# Patient Record
Sex: Female | Born: 1940
Health system: Southern US, Community
[De-identification: ages and names within clinical notes are randomized; demographics above are authoritative.]

## PROBLEM LIST (undated history)

## (undated) DIAGNOSIS — K219 Gastro-esophageal reflux disease without esophagitis: Secondary | ICD-10-CM

## (undated) DIAGNOSIS — F32A Depression, unspecified: Secondary | ICD-10-CM

## (undated) DIAGNOSIS — I10 Essential (primary) hypertension: Secondary | ICD-10-CM

## (undated) DIAGNOSIS — N3941 Urge incontinence: Secondary | ICD-10-CM

## (undated) DIAGNOSIS — I872 Venous insufficiency (chronic) (peripheral): Secondary | ICD-10-CM

## (undated) DIAGNOSIS — J45909 Unspecified asthma, uncomplicated: Secondary | ICD-10-CM

## (undated) DIAGNOSIS — F329 Major depressive disorder, single episode, unspecified: Secondary | ICD-10-CM

## (undated) DIAGNOSIS — M1990 Unspecified osteoarthritis, unspecified site: Secondary | ICD-10-CM

## (undated) HISTORY — DX: Urge incontinence: N39.41

## (undated) HISTORY — DX: Unspecified osteoarthritis, unspecified site: M19.90

## (undated) HISTORY — PX: JOINT REPLACEMENT: SHX530

## (undated) HISTORY — DX: Essential (primary) hypertension: I10

## (undated) HISTORY — DX: Venous insufficiency (chronic) (peripheral): I87.2

## (undated) HISTORY — DX: Unspecified asthma, uncomplicated: J45.909

## (undated) HISTORY — DX: Gastro-esophageal reflux disease without esophagitis: K21.9

---

## 1964-12-16 HISTORY — PX: TUBAL LIGATION: SHX77

## 1964-12-16 HISTORY — PX: APPENDECTOMY: SHX54

## 2006-02-09 ENCOUNTER — Emergency Department: Payer: Self-pay | Admitting: Emergency Medicine

## 2008-11-16 ENCOUNTER — Ambulatory Visit: Payer: Self-pay | Admitting: Family Medicine

## 2008-12-12 ENCOUNTER — Ambulatory Visit: Payer: Self-pay | Admitting: Family Medicine

## 2008-12-19 LAB — HM DEXA SCAN: HM Dexa Scan: NORMAL

## 2009-03-16 LAB — HM MAMMOGRAPHY: HM Mammogram: NORMAL

## 2010-06-11 ENCOUNTER — Ambulatory Visit: Payer: Self-pay | Admitting: Internal Medicine

## 2010-08-21 ENCOUNTER — Ambulatory Visit: Payer: Self-pay | Admitting: Internal Medicine

## 2010-08-21 DIAGNOSIS — I10 Essential (primary) hypertension: Secondary | ICD-10-CM | POA: Insufficient documentation

## 2010-08-21 DIAGNOSIS — I872 Venous insufficiency (chronic) (peripheral): Secondary | ICD-10-CM | POA: Insufficient documentation

## 2010-08-21 DIAGNOSIS — M159 Polyosteoarthritis, unspecified: Secondary | ICD-10-CM | POA: Insufficient documentation

## 2010-08-23 LAB — CONVERTED CEMR LAB
ALT: 14 units/L (ref 0–35)
AST: 20 units/L (ref 0–37)
Albumin: 4.6 g/dL (ref 3.5–5.2)
Alkaline Phosphatase: 83 units/L (ref 39–117)
BUN: 13 mg/dL (ref 6–23)
Basophils Absolute: 0 10*3/uL (ref 0.0–0.1)
Basophils Relative: 0.7 % (ref 0.0–3.0)
Bilirubin, Direct: 0.2 mg/dL (ref 0.0–0.3)
CO2: 30 meq/L (ref 19–32)
Calcium: 9.5 mg/dL (ref 8.4–10.5)
Chloride: 104 meq/L (ref 96–112)
Creatinine, Ser: 0.6 mg/dL (ref 0.4–1.2)
Eosinophils Absolute: 0.1 10*3/uL (ref 0.0–0.7)
Eosinophils Relative: 1.6 % (ref 0.0–5.0)
GFR calc non Af Amer: 105.97 mL/min (ref >60)
Glucose, Bld: 82 mg/dL (ref 70–99)
HCT: 43.3 % (ref 36.0–46.0)
Hemoglobin: 14.4 g/dL (ref 12.0–15.0)
Lymphocytes Relative: 25.1 % (ref 12.0–46.0)
Lymphs Abs: 1.8 10*3/uL (ref 0.7–4.0)
MCHC: 33.3 g/dL (ref 30.0–36.0)
MCV: 87.3 fL (ref 78.0–100.0)
Monocytes Absolute: 0.5 10*3/uL (ref 0.1–1.0)
Monocytes Relative: 7.7 % (ref 3.0–12.0)
Neutro Abs: 4.6 10*3/uL (ref 1.4–7.7)
Neutrophils Relative %: 64.9 % (ref 43.0–77.0)
Phosphorus: 3.2 mg/dL (ref 2.3–4.6)
Platelets: 164 10*3/uL (ref 150.0–400.0)
Potassium: 4 meq/L (ref 3.5–5.1)
RBC: 4.97 M/uL (ref 3.87–5.11)
RDW: 14 % (ref 11.5–14.6)
Sodium: 143 meq/L (ref 135–145)
TSH: 1.43 microintl units/mL (ref 0.35–5.50)
Total Bilirubin: 0.9 mg/dL (ref 0.3–1.2)
Total Protein: 7.4 g/dL (ref 6.0–8.3)
WBC: 7.1 10*3/uL (ref 4.5–10.5)

## 2010-11-12 ENCOUNTER — Telehealth: Payer: Self-pay | Admitting: Internal Medicine

## 2011-01-11 ENCOUNTER — Telehealth: Payer: Self-pay | Admitting: Internal Medicine

## 2011-01-15 NOTE — Assessment & Plan Note (Signed)
Summary: NEW PT TO EST/CLE   Vital Signs:  Patient profile:   70 year old female Height:      64 inches Weight:      248 pounds BMI:     42.72 Temp:     97.9 degrees F oral Pulse rate:   72 / minute Pulse rhythm:   regular BP sitting:   158 / 80  (left arm) Cuff size:   large  Vitals Entered By: Mervin Hack CMA Duncan Dull) (August 21, 2010 12:19 PM) CC: NEW PATIENT TO EST CARE   History of Present Illness: Had been seen at [facility] not satisfied with things there meds changed a lot  Was referred here  HTN diagnosed last year never had a problem before---had been going to [facility] before [facility] Occ headaches-- ?sinus  does get some swelling in mill when it is hot has some dark areas on calves  Preventive Screening-Counseling & Management  Alcohol-Tobacco     Smoking Status: never  Allergies (verified): No Known Drug Allergies  Past History:  Past Medical History: Hypertension Osteoarthritis Venous insufficiency  Past Surgical History: Appendectomy and tubal ligation 1966  Family History: Dad died of MI @49  Mom died @86    not sure about her history--was estranged 2 brothers-- 1 died from alcoholism, 1 died of some heart trouble 2 sisters living Brother had DM No HTN No colon or breast cancer  Social History: Occupation:  folds socks  Divorced-- 3 children Never Smoked Alcohol use-no  Has living will. Has designated son Ronny Flurry to make health care decisions for her. Would still accept CPR/resuscitation Hasn't considered tube feedingsOccupation:  employed Smoking Status:  never  Review of Systems General:  weight has been stable sleeps okay no exercise wears seat belt. Eyes:  Denies double vision and vision loss-1 eye. ENT:  Denies decreased hearing and ringing in ears; own teeth--regular with dentist. CV:  Complains of lightheadness; denies chest pain or discomfort, difficulty breathing at night, difficulty breathing while lying  down, fainting, palpitations, and shortness of breath with exertion; did have dizzy spell a couple of weeks ago--related to the heat. Resp:  Denies cough and shortness of breath. GI:  Denies abdominal pain, bloody stools, change in bowel habits, dark tarry stools, indigestion, nausea, and vomiting. GU:  Denies dysuria, hematuria, and incontinence. MS:  Complains of joint pain; denies joint swelling; trouble esp with knees--can't climb steps. Derm:  See HPI; Complains of rash; denies lesion(s). Neuro:  Complains of headaches; denies numbness, tingling, and weakness; occ headache. Psych:  Denies anxiety and depression; occ crying spells but rare and no persistent sadness. Heme:  Complains of skin discoloration; denies abnormal bruising and enlarge lymph nodes.  Physical Exam  General:  alert and normal appearance.   Eyes:  pupils equal, pupils round, and pupils reactive to light.   Mouth:  no erythema and no exudates.   Neck:  supple, no masses, no thyromegaly, no carotid bruits, and no cervical lymphadenopathy.   Lungs:  normal respiratory effort, no intercostal retractions, no accessory muscle use, and normal breath sounds.   Heart:  normal rate, regular rhythm, no murmur, and no gallop.   Abdomen:  soft and non-tender.   Msk:  nodules and thickening in hands--DIP and PIP Knees are thickened without apparent effusion Pulses:  1+ in feet Extremities:  slight calf edema bilat Skin:  no suspicious lesions and no ulcerations.   Early venous stasis changes esp right calf Psych:  normally interactive, good eye  contact, not anxious appearing, and not depressed appearing.     Impression & Recommendations:  Problem # 1:  HYPERTENSION (ICD-401.9) Assessment Comment Only  BP is fine discussed NSAID and BP needs to work on fitness will check labs  Her updated medication list for this problem includes:    Amlodipine Besylate 5 Mg Tabs (Amlodipine besylate) .Marland Kitchen... Take 1 by mouth once  daily  BP today: 158/80  Orders: Venipuncture (16109) TLB-Renal Function Panel (80069-RENAL) TLB-CBC Platelet - w/Differential (85025-CBCD) TLB-Hepatic/Liver Function Pnl (80076-HEPATIC) TLB-TSH (Thyroid Stimulating Hormone) (84443-TSH)  Problem # 2:  OSTEOARTHRITIS (ICD-715.90) Assessment: Comment Only quite a limiting factor for her discussed bicycle or pool exercises try the meloxicam as needed if possible---could affect edema and BP  Her updated medication list for this problem includes:    Meloxicam 15 Mg Tabs (Meloxicam) .Marland Kitchen... Take 1 by mouth once daily as needed for arthritis pain    Tramadol Hcl 50 Mg Tabs (Tramadol hcl) .Marland Kitchen... Take 1 by mouth two times a day as needed    Tylenol Arthritis Pain 650 Mg Cr-tabs (Acetaminophen) .Marland Kitchen... Take 1 by mouth once daily  Problem # 3:  VENOUS INSUFFICIENCY (ICD-459.81) Assessment: Comment Only discussed support hose at work Had trial of HCTZ without effect--if needs meds, would try furosemide  Complete Medication List: 1)  Meloxicam 15 Mg Tabs (Meloxicam) .... Take 1 by mouth once daily as needed for arthritis pain 2)  Amlodipine Besylate 5 Mg Tabs (Amlodipine besylate) .... Take 1 by mouth once daily 3)  Tramadol Hcl 50 Mg Tabs (Tramadol hcl) .... Take 1 by mouth two times a day as needed 4)  Tylenol Arthritis Pain 650 Mg Cr-tabs (Acetaminophen) .... Take 1 by mouth once daily 5)  Calcium 600-d 600-400 Mg-unit Tabs (Calcium carbonate-vitamin d) .... 2 tabs daily  Other Orders: Tdap => 14yrs IM (60454) Admin 1st Vaccine (09811)  Patient Instructions: 1)  Please schedule a follow-up appointment in 6 months for physical Prescriptions: TRAMADOL HCL 50 MG TABS (TRAMADOL HCL) take 1 by mouth two times a day as needed  #60 x 2   Entered and Authorized by:   Cindee Salt MD   Signed by:   Cindee Salt MD on 08/21/2010   Method used:   Electronically to        [facility] (retail)       [address]        [place], [place]  [address]       Ph: [phone]       Fax: [phone]   RxID:   [id number]   Current Allergies (reviewed today): No known allergies    Mammogram  Procedure date:  03/16/2009  Findings:      No specific mammographic evidence of malignancy.      Immunization History:  Tetanus/Td Immunization History:    Tetanus/Td:  Tdap (08/21/2010)  Pneumovax Immunization History:    Pneumovax:  Historical (01/16/2009)  Immunizations Administered:  Tetanus Vaccine:    Vaccine Type: Tdap    Site: right deltoid    Mfr: GlaxoSmithKline    Dose: 0.5 ml    Route: IM    Given by: Mervin Hack CMA (AAMA)    Exp. Date: 09/05/2012    Lot #: [id number]    VIS given: 11/02/08 version given August 21, 2010.

## 2011-01-15 NOTE — Progress Notes (Signed)
Summary: Tramadol  Phone Note Refill Request Message from:  Fax from Pharmacy on November 12, 2010 5:16 PM  Refills Requested: Medication #1:  TRAMADOL HCL 50 MG TABS take 1 by mouth two times a day as needed [facility]  Phone:   [phone]   Method Requested: Electronic Initial call taken by: Delilah Shan CMA Duncan Dull),  November 12, 2010 5:17 PM  Follow-up for Phone Call        okay to send #60 x 1 Follow-up by: Cindee Salt MD,  November 12, 2010 5:20 PM  Additional Follow-up for Phone Call Additional follow up Details #1::        Rx faxed to pharmacy Additional Follow-up by: DeShannon Smith CMA Duncan Dull),  November 13, 2010 10:31 AM    Prescriptions: TRAMADOL HCL 50 MG TABS (TRAMADOL HCL) take 1 by mouth two times a day as needed  #60 x 1   Entered by:   Mervin Hack CMA (AAMA)   Authorized by:   Cindee Salt MD   Signed by:   Mervin Hack CMA (AAMA) on 11/13/2010   Method used:   Electronically to        [facility] (retail)       [address]       [place], [place]  [address]       Ph: [phone]       Fax: [phone]   RxID:   [id number]

## 2011-01-17 NOTE — Progress Notes (Signed)
Summary: refill requests for mobic, tramadol  Phone Note Refill Request Message from:  Fax from Pharmacy  Refills Requested: Medication #1:  TRAMADOL HCL 50 MG TABS take 1 by mouth two times a day as needed   Last Refilled: 12/14/2010  Medication #2:  MELOXICAM 15 MG TABS take 1 by mouth once daily as needed for arthritis pain   Last Refilled: 12/14/2010  Faxed forms from [facility] are on your desk.  Initial call taken by: Lowella Petties CMA, AAMA,  January 11, 2011 10:09 AM  Follow-up for Phone Call        Rx completed in Dr. Tiajuana Amass Follow-up by: Cindee Salt MD,  January 11, 2011 1:19 PM    New/Updated Medications: MELOXICAM 15 MG TABS (MELOXICAM) take 1 by mouth once daily as needed for arthritis pain TRAMADOL HCL 50 MG TABS (TRAMADOL HCL) take 1 by mouth two times a day as needed Prescriptions: TRAMADOL HCL 50 MG TABS (TRAMADOL HCL) take 1 by mouth two times a day as needed  #60 x 1   Entered and Authorized by:   Cindee Salt MD   Signed by:   Cindee Salt MD on 01/11/2011   Method used:   Electronically to        [facility] (retail)       [address]       [place], [place]  [address]       Ph: [phone]       Fax: [phone]   RxID:   [id number] MELOXICAM 15 MG TABS (MELOXICAM) take 1 by mouth once daily as needed for arthritis pain  #30 x 5   Entered and Authorized by:   Cindee Salt MD   Signed by:   Cindee Salt MD on 01/11/2011   Method used:   Electronically to        [facility] (retail)       [address]       [place], [place]  [address]       Ph: [phone]       Fax: [phone]   RxID:   [id number]

## 2011-01-26 ENCOUNTER — Encounter: Payer: Self-pay | Admitting: Internal Medicine

## 2011-03-19 ENCOUNTER — Encounter: Payer: Self-pay | Admitting: Internal Medicine

## 2011-03-19 ENCOUNTER — Ambulatory Visit (INDEPENDENT_AMBULATORY_CARE_PROVIDER_SITE_OTHER): Payer: Medicare Other | Admitting: Internal Medicine

## 2011-03-19 VITALS — BP 138/60 | HR 70 | Temp 98.4°F | Ht 64.5 in | Wt 249.0 lb

## 2011-03-19 DIAGNOSIS — N3941 Urge incontinence: Secondary | ICD-10-CM

## 2011-03-19 DIAGNOSIS — M1990 Unspecified osteoarthritis, unspecified site: Secondary | ICD-10-CM

## 2011-03-19 DIAGNOSIS — I10 Essential (primary) hypertension: Secondary | ICD-10-CM

## 2011-03-19 DIAGNOSIS — Z0000 Encounter for general adult medical examination without abnormal findings: Secondary | ICD-10-CM | POA: Insufficient documentation

## 2011-03-19 DIAGNOSIS — N3946 Mixed incontinence: Secondary | ICD-10-CM | POA: Insufficient documentation

## 2011-03-19 DIAGNOSIS — Z1231 Encounter for screening mammogram for malignant neoplasm of breast: Secondary | ICD-10-CM

## 2011-03-19 LAB — CBC WITH DIFFERENTIAL/PLATELET
Basophils Absolute: 0 10*3/uL (ref 0.0–0.1)
Basophils Relative: 0.8 % (ref 0.0–3.0)
Eosinophils Absolute: 0.1 10*3/uL (ref 0.0–0.7)
Eosinophils Relative: 2.5 % (ref 0.0–5.0)
HCT: 43.2 % (ref 36.0–46.0)
Hemoglobin: 14.6 g/dL (ref 12.0–15.0)
Lymphocytes Relative: 25.2 % (ref 12.0–46.0)
Lymphs Abs: 1.5 10*3/uL (ref 0.7–4.0)
MCHC: 33.9 g/dL (ref 30.0–36.0)
MCV: 86.8 fl (ref 78.0–100.0)
Monocytes Absolute: 0.5 10*3/uL (ref 0.1–1.0)
Monocytes Relative: 8.1 % (ref 3.0–12.0)
Neutro Abs: 3.7 10*3/uL (ref 1.4–7.7)
Neutrophils Relative %: 63.4 % (ref 43.0–77.0)
Platelets: 162 10*3/uL (ref 150.0–400.0)
RBC: 4.98 Mil/uL (ref 3.87–5.11)
RDW: 14.1 % (ref 11.5–14.6)
WBC: 5.8 10*3/uL (ref 4.5–10.5)

## 2011-03-19 MED ORDER — AMLODIPINE BESYLATE 5 MG PO TABS: 5.0000 mg | ORAL_TABLET | Freq: Every day | ORAL | Status: DC

## 2011-03-19 MED ORDER — TRAMADOL HCL 50 MG PO TABS: 50.0000 mg | ORAL_TABLET | Freq: Two times a day (BID) | ORAL | Status: DC | PRN

## 2011-03-19 NOTE — Patient Instructions (Signed)
Please schedule your mammogram Please return the stool sample for colon cancer screening

## 2011-03-19 NOTE — Progress Notes (Signed)
Subjective:    Patient ID: Autumn Price, female    DOB: [birth year], 70 y.o.   MRN: [id number]  HPI Doing okay in general Mostly in knees Finds the tramadol doesn't do that much good Has to take 2 just to get going in AM--this does ease it Pain bad by the time she gets into bed Cortisone injections didn't do any good in past Still taking the meloxicam daily Did get exercise bike---hard to do it  Has urge incontinence at night Heard about vitamin B complex--I told her this is okay to try  Did fall once at [facility] stepping off curb  Past Medical History  Diagnosis Date  . Hypertension   . Osteoarthritis   . Venous insufficiency   . Urge incontinence     Past Surgical History  Procedure Date  . Appendectomy 1966  . Tubal ligation 1966    Family History  Problem Relation Age of Onset  . Heart disease Father     died age 64- MI  . Alcohol abuse Brother   . Heart disease Brother     History   Social History  . Marital Status: Single    Spouse Name: N/A    Number of Children: 3  . 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 Smoking status: Never Smoker   . Smokeless tobacco: Not on file  . Alcohol Use: No  . Drug Use: Not on file  . Sexually Active: Not on file   Other Topics Concern  . Not on file   Social History Narrative   Has living willDesignated son Ronny Flurry to make health care decisions for herWould accept CPR/resuscitationHasn't considered tube feedings     Review of Systems  Constitutional: Negative for activity change and fatigue.       Weight stable Wears seat belt  HENT: Negative for hearing loss, congestion, rhinorrhea, dental problem, postnasal drip and tinnitus.   Eyes: Negative for visual disturbance.       No vision changes or diplopia Recent eye exam  Respiratory: Negative for cough, chest tightness and shortness of breath.   Cardiovascular: Positive for palpitations and leg  swelling. Negative for chest pain.       Occ gets sense of heart in her throat--feels it up there (with stress or excitement) Slight right ankle swelling  Genitourinary: Negative for dysuria and difficulty urinating.       Some night incontinence No sexual problems  Musculoskeletal: Positive for back pain and arthralgias. Negative for joint swelling.       Mostly just knee pain Some right flank/back pain when she has to lift heavy boxes at work  Skin: Negative for rash.       No suspicious lesions  Neurological: Negative for dizziness, syncope, weakness, light-headedness, numbness and headaches.  Hematological: Negative for adenopathy. Does not bruise/bleed easily.  Psychiatric/Behavioral: Negative for sleep disturbance and dysphoric mood.       Objective:   Physical Exam  Constitutional: She is oriented to person, place, and time. She appears well-developed and well-nourished. No distress.  HENT:  Head: Normocephalic and atraumatic.  Mouth/Throat: Oropharynx is clear and moist. No oropharyngeal exudate.       TMs fine Teeth okay  Eyes: Conjunctivae and EOM are normal. Pupils are equal, round, and reactive to light.       Fundi benign  Neck: Normal range of motion. Neck supple. No thyromegaly present.  Cardiovascular:  Normal rate, regular rhythm, normal heart sounds and intact distal pulses.  Exam reveals no gallop.   No murmur heard. Pulmonary/Chest: Effort normal and breath sounds normal. No respiratory distress. She has no wheezes. She has no rales.  Abdominal: Soft. She exhibits no mass. There is no tenderness.  Genitourinary:       Breasts have moderate cystic changes bilaterally but nothing worrisome No tenderness  Musculoskeletal: Normal range of motion. She exhibits no edema and no tenderness.       Knees are thickened without effusion Nodules on hand PIP and DIPs  Lymphadenopathy:    She has no cervical adenopathy.  Neurological: She is alert and oriented to person,  place, and time. She exhibits normal muscle tone.       No focal weakness  Skin: Skin is warm. No rash noted.  Psychiatric: She has a normal mood and affect. Her behavior is normal. Judgment and thought content normal.          Assessment & Plan:

## 2011-03-20 LAB — TSH: TSH: 1.18 u[IU]/mL (ref 0.35–5.50)

## 2011-03-20 LAB — BASIC METABOLIC PANEL
BUN: 12 mg/dL (ref 6–23)
CO2: 30 mEq/L (ref 19–32)
Calcium: 9.8 mg/dL (ref 8.4–10.5)
Chloride: 105 mEq/L (ref 96–112)
Creatinine, Ser: 0.7 mg/dL (ref 0.4–1.2)
GFR: 96.45 mL/min (ref >60.00)
Glucose, Bld: 90 mg/dL (ref 70–99)
Potassium: 4.6 mEq/L (ref 3.5–5.1)
Sodium: 144 mEq/L (ref 135–145)

## 2011-03-20 LAB — HEPATIC FUNCTION PANEL
ALT: 15 U/L (ref 0–35)
AST: 18 U/L (ref 0–37)
Albumin: 4.6 g/dL (ref 3.5–5.2)
Alkaline Phosphatase: 91 U/L (ref 39–117)
Bilirubin, Direct: 0.1 mg/dL (ref 0.0–0.3)
Total Bilirubin: 0.6 mg/dL (ref 0.3–1.2)
Total Protein: 7.3 g/dL (ref 6.0–8.3)

## 2011-04-03 ENCOUNTER — Other Ambulatory Visit (INDEPENDENT_AMBULATORY_CARE_PROVIDER_SITE_OTHER): Payer: Medicare Other | Admitting: Internal Medicine

## 2011-04-03 ENCOUNTER — Other Ambulatory Visit: Payer: Medicare Other

## 2011-04-03 DIAGNOSIS — Z1211 Encounter for screening for malignant neoplasm of colon: Secondary | ICD-10-CM

## 2011-04-03 LAB — FECAL OCCULT BLOOD, IMMUNOCHEMICAL: Fecal Occult Bld: NEGATIVE

## 2011-04-04 ENCOUNTER — Encounter: Payer: Self-pay | Admitting: *Deleted

## 2011-04-15 ENCOUNTER — Other Ambulatory Visit: Payer: Self-pay | Admitting: *Deleted

## 2011-04-16 MED ORDER — TRAMADOL HCL 50 MG PO TABS: ORAL_TABLET | ORAL | Status: DC

## 2011-04-16 NOTE — Telephone Encounter (Signed)
Okay #120 x 0 

## 2011-04-16 NOTE — Telephone Encounter (Signed)
rx faxed to pharmacy

## 2011-04-19 ENCOUNTER — Ambulatory Visit: Payer: Self-pay | Admitting: Internal Medicine

## 2011-04-23 ENCOUNTER — Encounter: Payer: Self-pay | Admitting: Internal Medicine

## 2011-04-29 ENCOUNTER — Encounter: Payer: Self-pay | Admitting: *Deleted

## 2011-05-16 ENCOUNTER — Other Ambulatory Visit: Payer: Self-pay | Admitting: *Deleted

## 2011-05-16 NOTE — Telephone Encounter (Signed)
Faxed request from [facility] is on your desk.

## 2011-05-17 MED ORDER — TRAMADOL HCL 50 MG PO TABS: ORAL_TABLET | ORAL | Status: DC

## 2011-06-21 ENCOUNTER — Telehealth: Payer: Self-pay | Admitting: *Deleted

## 2011-06-21 MED ORDER — TRAMADOL HCL 50 MG PO TABS: ORAL_TABLET | ORAL | Status: DC

## 2011-06-21 NOTE — Telephone Encounter (Signed)
Pt requesting refill on Tramadol 50mg  [last refill 05/16/11 #120x0] to [facility]

## 2011-06-21 NOTE — Telephone Encounter (Signed)
Okay #120 x 0 

## 2011-06-21 NOTE — Telephone Encounter (Signed)
rx sent to pharmacy by e-script  

## 2011-07-19 ENCOUNTER — Other Ambulatory Visit: Payer: Self-pay | Admitting: *Deleted

## 2011-07-19 MED ORDER — TRAMADOL HCL 50 MG PO TABS: ORAL_TABLET | ORAL | Status: DC

## 2011-07-19 MED ORDER — MELOXICAM 15 MG PO TABS: 15.0000 mg | ORAL_TABLET | Freq: Every day | ORAL | Status: DC

## 2011-07-19 NOTE — Telephone Encounter (Signed)
Tramadol last filled 06/21/11, mobic last filled 06/17/11.

## 2011-08-21 ENCOUNTER — Other Ambulatory Visit: Payer: Self-pay | Admitting: *Deleted

## 2011-08-21 MED ORDER — MELOXICAM 15 MG PO TABS: 15.0000 mg | ORAL_TABLET | Freq: Every day | ORAL | Status: DC

## 2011-08-21 MED ORDER — TRAMADOL HCL 50 MG PO TABS: ORAL_TABLET | ORAL | Status: DC

## 2011-08-21 NOTE — Telephone Encounter (Signed)
Last refill 07/19/2011. 

## 2011-08-21 NOTE — Telephone Encounter (Signed)
Rx done electronically 

## 2011-09-18 ENCOUNTER — Other Ambulatory Visit: Payer: Self-pay | Admitting: *Deleted

## 2011-09-18 MED ORDER — TRAMADOL HCL 50 MG PO TABS: 50.0000 mg | ORAL_TABLET | Freq: Two times a day (BID) | ORAL | Status: DC | PRN

## 2011-09-18 NOTE — Telephone Encounter (Signed)
Rx sent electronically.  

## 2011-09-18 NOTE — Telephone Encounter (Signed)
Ok to fill 

## 2011-09-20 ENCOUNTER — Ambulatory Visit: Payer: Medicare Other | Admitting: Internal Medicine

## 2011-09-23 ENCOUNTER — Encounter: Payer: Self-pay | Admitting: Internal Medicine

## 2011-09-23 ENCOUNTER — Ambulatory Visit (INDEPENDENT_AMBULATORY_CARE_PROVIDER_SITE_OTHER): Payer: Medicare Other | Admitting: Internal Medicine

## 2011-09-23 ENCOUNTER — Telehealth: Payer: Self-pay | Admitting: *Deleted

## 2011-09-23 VITALS — BP 171/76 | HR 70 | Temp 98.6°F | Ht 64.0 in | Wt 250.0 lb

## 2011-09-23 DIAGNOSIS — I10 Essential (primary) hypertension: Secondary | ICD-10-CM

## 2011-09-23 DIAGNOSIS — R42 Dizziness and giddiness: Secondary | ICD-10-CM | POA: Insufficient documentation

## 2011-09-23 MED ORDER — MECLIZINE HCL 25 MG PO TABS: 25.0000 mg | ORAL_TABLET | Freq: Three times a day (TID) | ORAL | Status: AC | PRN

## 2011-09-23 NOTE — Telephone Encounter (Signed)
Can offer 2:30 this afternoon or 8:30, 8:45 tomorrow morning if she needs to be seen

## 2011-09-23 NOTE — Telephone Encounter (Signed)
Patient was at work and had a spell where she became dizzy, sweaty and nauseated. They called her son and he went and picked her up and she is now at his house. Talked with patient and daughter-in-law and was informed that she is not dizzy or sweating any longer, but is still nauseated. Patient's daughter-in-law checked her BP and it was 160/76 and pulse 72 and rechecked it again and it was 137/65, pulse 70. No appointments available today, please advise.

## 2011-09-23 NOTE — Telephone Encounter (Signed)
Patient scheduled appt today at 2:30

## 2011-09-23 NOTE — Assessment & Plan Note (Signed)
Clearly seems to be vestibular Nothing to suggest stroke Will use prn meclizine

## 2011-09-23 NOTE — Progress Notes (Signed)
Subjective:    Patient ID: Autumn Price, female    DOB: Apr 06, 1941, 70 y.o.   MRN: [id number]  HPI Today around 9:15 at work, started feeling dizzy and then had rotatory vertigo Some nausea with this Had son pick her up --didn't feel she could drive DIL checking BP---- 147/77- 162/74  No headache Did have vertigo about 6-7 years ago---that was constant No weakness, speech or swallowing problems  Hadn't been taking BP before   Current Outpatient Prescriptions on File Prior to Visit  Medication Sig Dispense Refill  . acetaminophen (TYLENOL) 500 MG tablet Take 500 mg by mouth every 6 (six) hours as needed.        Marland Kitchen amLODipine (NORVASC) 5 MG tablet Take 1 tablet (5 mg total) by mouth daily.  30 tablet  11  . Cholecalciferol (VITAMIN D-3 PO) Take by mouth daily.        . meloxicam (MOBIC) 15 MG tablet Take 1 tablet (15 mg total) by mouth daily. As needed for arthritis pain   30 tablet  11  . traMADol (ULTRAM) 50 MG tablet Take 1-2 tablets (50-100 mg total) by mouth 2 (two) times daily as needed for pain.  120 tablet  0    No Known Allergies  Past Medical History  Diagnosis Date  . Hypertension   . Osteoarthritis   . Venous insufficiency   . Urge incontinence     Past Surgical History  Procedure Date  . Appendectomy 1966  . Tubal ligation 1966    Family History  Problem Relation Age of Onset  . Heart disease Father     died age 28- MI  . Alcohol abuse Brother   . Heart disease Brother     History   Social History  . Marital Status: Single    Spouse Name: N/A    Number of Children: 3  . 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 Smoking status: Never Smoker   . Smokeless tobacco: Never Used  . Alcohol Use: No  . Drug Use: Not on file  . Sexually Active: Not on file   Other Topics Concern  . Not on file   Social History Narrative   Has living willDesignated son Ronny Flurry to make health care  decisions for herWould accept CPR/resuscitationHasn't considered tube feedings   Review of Systems Able to eat breakfast--still some nausea since No diarrhea No recent travel Some right foot swelling for several days     Objective:   Physical Exam  Constitutional: She appears well-developed and well-nourished. No distress.  HENT:  Head: Normocephalic and atraumatic.  Right Ear: External ear normal.  Left Ear: External ear normal.  Mouth/Throat: Oropharynx is clear and moist. No oropharyngeal exudate.  Eyes: Conjunctivae and EOM are normal. Pupils are equal, round, and reactive to light.       Fundi benign No nystagmus  Neck: Normal range of motion. Neck supple.  Cardiovascular: Normal rate, regular rhythm and normal heart sounds.  Exam reveals no gallop.   No murmur heard. Pulmonary/Chest: Effort normal and breath sounds normal. No respiratory distress. She has no wheezes. She has no rales.  Musculoskeletal: She exhibits no edema and no tenderness.  Lymphadenopathy:    She has no cervical adenopathy.  Neurological: She has normal strength. She displays no atrophy and no tremor. No cranial nerve deficit. She exhibits normal muscle tone. Coordination and gait normal.  Did lean to left with Romberg--caught herself with arm on wall Finger to nose normal  Psychiatric: She has a normal mood and affect. Her behavior is normal. Judgment and thought content normal.          Assessment & Plan:

## 2011-09-23 NOTE — Assessment & Plan Note (Signed)
BP Readings from Last 3 Encounters:  09/23/11 171/76  03/19/11 138/60  08/21/10 158/80   Recheck by me on right was 168/78 May be elevated due to vertigo Will have her check and reeval at follow up in November

## 2011-09-23 NOTE — Patient Instructions (Signed)
Please cancel 10/19 appt Check BP about weekly, write them down and bring with you to next month's appt. Call if above 160/100

## 2011-10-04 ENCOUNTER — Ambulatory Visit: Payer: Medicare Other | Admitting: Internal Medicine

## 2011-10-18 ENCOUNTER — Other Ambulatory Visit: Payer: Self-pay | Admitting: *Deleted

## 2011-10-18 MED ORDER — TRAMADOL HCL 50 MG PO TABS: 50.0000 mg | ORAL_TABLET | Freq: Two times a day (BID) | ORAL | Status: DC | PRN

## 2011-10-25 ENCOUNTER — Ambulatory Visit (INDEPENDENT_AMBULATORY_CARE_PROVIDER_SITE_OTHER): Payer: Medicare Other | Admitting: Internal Medicine

## 2011-10-25 ENCOUNTER — Encounter: Payer: Self-pay | Admitting: Internal Medicine

## 2011-10-25 DIAGNOSIS — I10 Essential (primary) hypertension: Secondary | ICD-10-CM

## 2011-10-25 NOTE — Patient Instructions (Signed)
Please start [employer] again

## 2011-10-25 NOTE — Assessment & Plan Note (Signed)
BP Readings from Last 3 Encounters:  10/25/11 147/63  09/23/11 171/76  03/19/11 138/60   Better today Generally acceptable at home Will not increase meds Discussed lifestyle measures though

## 2011-10-25 NOTE — Progress Notes (Signed)
  Subjective:    Patient ID: Autumn Price, female    DOB: 07-18-1941, 70 y.o.   MRN: [id number]  HPI Vertigo did finally get better Only one small spell and the meclizine and sitting did resolve  Slight occ headaches No chest pain No SOB  Has monitored BP 130/68., 137/71, 142/74, 151/85  Current Outpatient Prescriptions on File Prior to Visit  Medication Sig Dispense Refill  . acetaminophen (TYLENOL) 500 MG tablet Take 500 mg by mouth every 6 (six) hours as needed.        Marland Kitchen amLODipine (NORVASC) 5 MG tablet Take 1 tablet (5 mg total) by mouth daily.  30 tablet  11  . Cholecalciferol (VITAMIN D-3 PO) Take by mouth daily.        . meloxicam (MOBIC) 15 MG tablet Take 1 tablet (15 mg total) by mouth daily. As needed for arthritis pain   30 tablet  11  . traMADol (ULTRAM) 50 MG tablet Take 1-2 tablets (50-100 mg total) by mouth 2 (two) times daily as needed for pain.  120 tablet  0    No Known Allergies  Past Medical History  Diagnosis Date  . Hypertension   . Osteoarthritis   . Venous insufficiency   . Urge incontinence     Past Surgical History  Procedure Date  . Appendectomy 1966  . Tubal ligation 1966    Family History  Problem Relation Age of Onset  . Heart disease Father     died age 31- MI  . Alcohol abuse Brother   . Heart disease Brother     History   Social History  . Marital Status: Single    Spouse Name: N/A    Number of Children: 3  . 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 Smoking status: Never Smoker   . Smokeless tobacco: Never Used  . Alcohol Use: No  . Drug Use: Not on file  . Sexually Active: Not on file   Other Topics Concern  . Not on file   Social History Narrative   Has living willDesignated son Ronny Flurry to make health care decisions for herWould accept CPR/resuscitationHasn't considered tube feedings   Review of Systems Sleeps okay Appetite is fine     Objective:     Physical Exam  Constitutional: She appears well-developed and well-nourished. No distress.  Neck: Normal range of motion. Neck supple. No thyromegaly present.  Cardiovascular: Normal rate, regular rhythm and normal heart sounds.  Exam reveals no gallop.   No murmur heard. Pulmonary/Chest: Effort normal and breath sounds normal. No respiratory distress. She has no wheezes. She has no rales.  Musculoskeletal: She exhibits no edema and no tenderness.       Thick calves without edema  Lymphadenopathy:    She has no cervical adenopathy.  Psychiatric: She has a normal mood and affect. Her behavior is normal. Judgment and thought content normal.          Assessment & Plan:

## 2011-11-15 ENCOUNTER — Other Ambulatory Visit: Payer: Self-pay | Admitting: *Deleted

## 2011-11-15 MED ORDER — TRAMADOL HCL 50 MG PO TABS: 50.0000 mg | ORAL_TABLET | Freq: Two times a day (BID) | ORAL | Status: DC | PRN

## 2011-11-15 NOTE — Telephone Encounter (Signed)
Rx sent electronically.  

## 2011-11-15 NOTE — Telephone Encounter (Signed)
Fax in your IN box, last refill 10/18/11.

## 2011-12-20 ENCOUNTER — Other Ambulatory Visit: Payer: Self-pay | Admitting: *Deleted

## 2011-12-20 MED ORDER — TRAMADOL HCL 50 MG PO TABS: 50.0000 mg | ORAL_TABLET | Freq: Two times a day (BID) | ORAL | Status: DC | PRN

## 2012-01-24 ENCOUNTER — Other Ambulatory Visit: Payer: Self-pay | Admitting: *Deleted

## 2012-01-24 MED ORDER — TRAMADOL HCL 50 MG PO TABS: 50.0000 mg | ORAL_TABLET | Freq: Two times a day (BID) | ORAL | Status: DC | PRN

## 2012-02-14 ENCOUNTER — Other Ambulatory Visit: Payer: Self-pay | Admitting: *Deleted

## 2012-02-14 MED ORDER — TRAMADOL HCL 50 MG PO TABS: 50.0000 mg | ORAL_TABLET | Freq: Two times a day (BID) | ORAL | Status: DC | PRN

## 2012-02-14 NOTE — Telephone Encounter (Signed)
Okay #120 x 0 Let her know this is a week early so she will need to wait more than a month for the next refill

## 2012-02-14 NOTE — Telephone Encounter (Signed)
rx sent to pharmacy by e-script .left message to have patient return my call.  

## 2012-03-20 ENCOUNTER — Other Ambulatory Visit: Payer: Self-pay | Admitting: *Deleted

## 2012-03-20 MED ORDER — TRAMADOL HCL 50 MG PO TABS: 50.0000 mg | ORAL_TABLET | Freq: Two times a day (BID) | ORAL | Status: DC | PRN

## 2012-03-20 MED ORDER — AMLODIPINE BESYLATE 5 MG PO TABS: 5.0000 mg | ORAL_TABLET | Freq: Every day | ORAL | Status: DC

## 2012-03-20 NOTE — Telephone Encounter (Signed)
Okay #120 x 1 

## 2012-03-20 NOTE — Telephone Encounter (Signed)
rx sent to pharmacy by e-script  

## 2012-03-30 ENCOUNTER — Telehealth: Payer: Self-pay | Admitting: *Deleted

## 2012-03-30 NOTE — Telephone Encounter (Signed)
Nurse practitioner calling to advise high bp readings, [employer] has a home program that allows pt's to enroll in it. Pt's bp on Friday 03/27/12 was left arm sitting 160/78 and right arm sitting 160/80, per NP that are required to alert PCP with readings that high.

## 2012-03-30 NOTE — Telephone Encounter (Signed)
Please call and make sure she feels okay Has had some borderline high readings here----may want to have her set up appt in the next few weeks just to check on the BP here

## 2012-03-31 NOTE — Telephone Encounter (Signed)
Spoke with patient and she states the NP checked her BP right after she came home from work and she was tired and her legs was hurting, she has been checking it every morning and evening and it's fine 130/77, she just needed time to settle down. Pt will keep appt in June.

## 2012-04-01 NOTE — Telephone Encounter (Signed)
That sounds fine

## 2012-04-07 ENCOUNTER — Encounter: Payer: Self-pay | Admitting: Family Medicine

## 2012-04-07 ENCOUNTER — Ambulatory Visit (INDEPENDENT_AMBULATORY_CARE_PROVIDER_SITE_OTHER): Payer: Medicare Other | Admitting: Family Medicine

## 2012-04-07 VITALS — BP 142/60 | HR 65 | Temp 97.4°F | Ht 64.0 in | Wt 253.2 lb

## 2012-04-07 DIAGNOSIS — I10 Essential (primary) hypertension: Secondary | ICD-10-CM

## 2012-04-07 DIAGNOSIS — F430 Acute stress reaction: Secondary | ICD-10-CM

## 2012-04-07 DIAGNOSIS — R002 Palpitations: Secondary | ICD-10-CM | POA: Insufficient documentation

## 2012-04-07 DIAGNOSIS — F438 Other reactions to severe stress: Secondary | ICD-10-CM

## 2012-04-07 NOTE — Assessment & Plan Note (Signed)
New/ episodic/ may be anxiety related  Unremarkable EKG today with NSR rate of 74 and no acute changes  Has strong family hx of cardiac dz  She is obese  Will check labs today incl tsh and cbc inst to stop caffeine Ref to cardiol - consider stress test/ holter and disc fam hx and risk factors

## 2012-04-07 NOTE — Progress Notes (Signed)
Subjective:    Patient ID: Autumn Price, female    DOB: Apr 19, 1941, 71 y.o.   MRN: [id number]  HPI Here for episodes of "heart racing"  Sunday am - taking grandson to church -- felt like heart was beating out of her throat   Monday- had episode of vertigo with dizziness and nausea  Went home from work and took meclizine- slept 3 hours - and then another episode  4 more epidoses today Last up to 3-4 minutes  Does not feel like skipping beats  Feels like it is beating fast and hard   Drinks one drink per day - is [name] or coke   Stress - son was in bad shape - surgery and several hosp -- with a bad hernia , then staph infection Doing better now  Still worried about  Him  Also great grand son -- worries he may be mistreated (did call social services)   bp is 142/ 60 Hx of HTN On amlodipine  Wt is up 4 lb with bmi of 43  Last labs 1 y ago Lab Results  Component Value Date   TSH 1.18 03/19/2011    No results found for this basename: CHOL, HDL, LDLCALC, LDLDIRECT, TRIG, CHOLHDL   does not have high chol   Father died of MI at 51 Brother  had CAD -- in early 62s , with bypass ischemic heart disease   Patient Active Problem List  Diagnoses  . HYPERTENSION  . VENOUS INSUFFICIENCY  . OSTEOARTHRITIS  . Routine general medical examination at a health care facility  . Urge incontinence  . Vertigo   Past Medical History  Diagnosis Date  . Hypertension   . Osteoarthritis   . Venous insufficiency   . Urge incontinence    Past Surgical History  Procedure Date  . Appendectomy 1966  . Tubal ligation 1966   History  Substance Use Topics  . Smoking status: Never Smoker   . Smokeless tobacco: Never Used  . Alcohol Use: No   Family History  Problem Relation Age of Onset  . Heart disease Father     died age 18- MI  . Alcohol abuse Brother   . Heart disease Brother    No Known Allergies Current Outpatient Prescriptions on File Prior to Visit  Medication Sig  Dispense Refill  . acetaminophen (TYLENOL) 500 MG tablet Take 500 mg by mouth every 6 (six) hours as needed.        Marland Kitchen amLODipine (NORVASC) 5 MG tablet Take 1 tablet (5 mg total) by mouth daily.  30 tablet  6  . Cholecalciferol (VITAMIN D-3 PO) Take by mouth daily.        . meclizine (ANTIVERT) 25 MG tablet Take 25 mg by mouth 3 (three) times daily as needed.       . meloxicam (MOBIC) 15 MG tablet Take 1 tablet (15 mg total) by mouth daily. As needed for arthritis pain   30 tablet  11  . traMADol (ULTRAM) 50 MG tablet Take 1-2 tablets (50-100 mg total) by mouth 2 (two) times daily as needed for pain.  120 tablet  1       Review of Systems Review of Systems  Constitutional: Negative for fever, appetite change, fatigue and unexpected weight change.  Eyes: Negative for pain and visual disturbance.  Respiratory: Negative for cough and shortness of breath.  no sob on exertion or wheeze Cardiovascular: Negative for cp /sob/ PND/ orthopnea/ leg pain or swelling  Gastrointestinal: Negative for nausea, diarrhea and constipation.  Genitourinary: Negative for urgency and frequency.  Skin: Negative for pallor or rash   Neurological: Negative for weakness, , numbness    Vertigo is better today, has had some headaches lately  Hematological: Negative for adenopathy. Does not bruise/bleed easily.  Psychiatric/Behavioral: Negative for dysphoric mood. The patient is nervous/ anxious with worry over her family           Objective:   Physical Exam  Constitutional: She appears well-developed and well-nourished. No distress.       Obese and mildly anxious appearing   HENT:  Head: Normocephalic and atraumatic.  Mouth/Throat: Oropharynx is clear and moist.  Eyes: Conjunctivae and EOM are normal. Pupils are equal, round, and reactive to light. No scleral icterus.  Neck: Normal range of motion. Neck supple. No JVD present. Carotid bruit is not present. No thyromegaly present.  Cardiovascular: Normal  rate, regular rhythm and intact distal pulses.  Exam reveals no gallop.   No murmur heard. Pulmonary/Chest: Effort normal and breath sounds normal. No respiratory distress. She has no wheezes.  Abdominal: Soft. Bowel sounds are normal. She exhibits no distension and no abdominal bruit. There is no tenderness.  Musculoskeletal: Normal range of motion. She exhibits no edema and no tenderness.       No leg tenderness or swelling  No palpable cords  Lymphadenopathy:    She has no cervical adenopathy.  Neurological: She is alert. She has normal reflexes. She exhibits normal muscle tone. Coordination normal.  Skin: Skin is warm and dry. No rash noted. No erythema. No pallor.  Psychiatric: Her speech is normal. Judgment and thought content normal. Her mood appears anxious. Her affect is not blunt and not labile. She is not agitated and not withdrawn. Cognition and memory are normal. She expresses no homicidal and no suicidal ideation.       Tearful at times when discussing some problems within the family           Assessment & Plan:

## 2012-04-07 NOTE — Assessment & Plan Note (Signed)
With recent family illnesses and stressors- pt is tearful today, but with good insight and coping skills She declined counseling but understands she can call back if she changes her mind

## 2012-04-07 NOTE — Assessment & Plan Note (Signed)
Stable in setting of stress and intermittent palpitations  Check lipids today  Rev meds

## 2012-04-07 NOTE — Patient Instructions (Signed)
Stop caffeine entirely  We will refer you to cardiology at check out  Labs today  Make sure you are drinking enough fluids  If you have and episode of palpitations that does not stop or you get chest pain or shortness of breath --please go to the emergency room

## 2012-04-08 LAB — CBC WITH DIFFERENTIAL/PLATELET
Basophils Absolute: 0 10*3/uL (ref 0.0–0.1)
Basophils Relative: 0.5 % (ref 0.0–3.0)
Eosinophils Absolute: 0.1 10*3/uL (ref 0.0–0.7)
Eosinophils Relative: 1.9 % (ref 0.0–5.0)
HCT: 44.5 % (ref 36.0–46.0)
Hemoglobin: 14.6 g/dL (ref 12.0–15.0)
Lymphocytes Relative: 25.6 % (ref 12.0–46.0)
Lymphs Abs: 1.7 10*3/uL (ref 0.7–4.0)
MCHC: 32.8 g/dL (ref 30.0–36.0)
MCV: 87.5 fl (ref 78.0–100.0)
Monocytes Absolute: 0.4 10*3/uL (ref 0.1–1.0)
Monocytes Relative: 5.4 % (ref 3.0–12.0)
Neutro Abs: 4.3 10*3/uL (ref 1.4–7.7)
Neutrophils Relative %: 66.6 % (ref 43.0–77.0)
Platelets: 146 10*3/uL — ABNORMAL LOW (ref 150.0–400.0)
RBC: 5.08 Mil/uL (ref 3.87–5.11)
RDW: 13.8 % (ref 11.5–14.6)
WBC: 6.5 10*3/uL (ref 4.5–10.5)

## 2012-04-08 LAB — COMPREHENSIVE METABOLIC PANEL
ALT: 14 U/L (ref 0–35)
AST: 19 U/L (ref 0–37)
Albumin: 4.4 g/dL (ref 3.5–5.2)
Alkaline Phosphatase: 78 U/L (ref 39–117)
BUN: 18 mg/dL (ref 6–23)
CO2: 24 mEq/L (ref 19–32)
Calcium: 9.7 mg/dL (ref 8.4–10.5)
Chloride: 105 mEq/L (ref 96–112)
Creatinine, Ser: 0.7 mg/dL (ref 0.4–1.2)
GFR: 89.75 mL/min (ref >60.00)
Glucose, Bld: 84 mg/dL (ref 70–99)
Potassium: 3.9 mEq/L (ref 3.5–5.1)
Sodium: 140 mEq/L (ref 135–145)
Total Bilirubin: 0.4 mg/dL (ref 0.3–1.2)
Total Protein: 7.5 g/dL (ref 6.0–8.3)

## 2012-04-08 LAB — LIPID PANEL
Cholesterol: 176 mg/dL (ref 0–200)
HDL: 49.4 mg/dL (ref >39.00)
LDL Cholesterol: 99 mg/dL (ref 0–99)
Total CHOL/HDL Ratio: 4
Triglycerides: 137 mg/dL (ref 0.0–149.0)
VLDL: 27.4 mg/dL (ref 0.0–40.0)

## 2012-04-08 LAB — TSH: TSH: 0.62 u[IU]/mL (ref 0.35–5.50)

## 2012-04-09 ENCOUNTER — Encounter: Payer: Self-pay | Admitting: *Deleted

## 2012-04-14 ENCOUNTER — Encounter: Payer: Self-pay | Admitting: Cardiovascular Disease

## 2012-04-14 ENCOUNTER — Ambulatory Visit (INDEPENDENT_AMBULATORY_CARE_PROVIDER_SITE_OTHER): Payer: Medicare Other | Admitting: Cardiovascular Disease

## 2012-04-14 VITALS — BP 155/85 | HR 74 | Ht 64.0 in | Wt 253.0 lb

## 2012-04-14 DIAGNOSIS — R0600 Dyspnea, unspecified: Secondary | ICD-10-CM

## 2012-04-14 DIAGNOSIS — R002 Palpitations: Secondary | ICD-10-CM

## 2012-04-14 DIAGNOSIS — R0602 Shortness of breath: Secondary | ICD-10-CM

## 2012-04-14 NOTE — Patient Instructions (Signed)
Your physician has requested that you have an echocardiogram. Echocardiography is a painless test that uses sound waves to create images of your heart. It provides your doctor with information about the size and shape of your heart and how well your heart's chambers and valves are working. This procedure takes approximately one hour. There are no restrictions for this procedure.  Your physician has recommended that you wear an event monitor. Event monitors are medical devices that record the heart's electrical activity. Doctors most often us these monitors to diagnose arrhythmias. Arrhythmias are problems with the speed or rhythm of the heartbeat. The monitor is a small, portable device. You can wear one while you do your normal daily activities. This is usually used to diagnose what is causing palpitations/syncope (passing out).  Follow up after tests.  

## 2012-04-22 ENCOUNTER — Telehealth: Payer: Self-pay | Admitting: Cardiovascular Disease

## 2012-04-22 NOTE — Telephone Encounter (Signed)
Mitch from [facility] is returning a call regarding a monitor for this patient.

## 2012-04-24 NOTE — Telephone Encounter (Signed)
Spoke with Mitch regarding monitor and it appears that everything has gone through for Ms. [employer].

## 2012-04-27 ENCOUNTER — Encounter: Payer: Self-pay | Admitting: Cardiovascular Disease

## 2012-04-27 NOTE — Progress Notes (Signed)
HPI  This is a 71 year old female who was referred by Dr. Milinda Antis for evaluation of palpitations. The patient reports no previous cardiac history. She had recent episodes of palpitations and fast heart beats. The first episode happened on Sunday while she was taking her grandson to discharge. The episode was sudden and lasted for about 10-15 minutes. She felt a pulsating feeling in her neck with slight dizziness. There was no syncope or presyncope. The next day, she did not feel well with symptoms of dizziness and nausea. The following days she had a few more episodes of tachycardia and palpitations but they did not last as long. She has not had any chest pain. She hasn't had any further episodes of palpitations since she saw Dr. Milinda Antis. She has been under stress lately related to significant for some who is recovering from a hernia surgery with complications. She denies excessive caffeine intake. She had routine labs performed recently including thyroid function and overall were unremarkable. She has no previous history of arrhythmia.  No Known Allergies   Current Outpatient Prescriptions on File Prior to Visit  Medication Sig Dispense Refill  . acetaminophen (TYLENOL) 500 MG tablet Take 500 mg by mouth every 6 (six) hours as needed.        Marland Kitchen amLODipine (NORVASC) 5 MG tablet Take 1 tablet (5 mg total) by mouth daily.  30 tablet  6  . Cholecalciferol (VITAMIN D-3 PO) Take by mouth daily.        . meclizine (ANTIVERT) 25 MG tablet Take 25 mg by mouth 3 (three) times daily as needed.       . meloxicam (MOBIC) 15 MG tablet Take 1 tablet (15 mg total) by mouth daily. As needed for arthritis pain   30 tablet  11  . traMADol (ULTRAM) 50 MG tablet Take 1-2 tablets (50-100 mg total) by mouth 2 (two) times daily as needed for pain.  120 tablet  1     Past Medical History  Diagnosis Date  . Hypertension   . Osteoarthritis   . Venous insufficiency   . Urge incontinence      Past Surgical History    Procedure Date  . Appendectomy 1966  . Tubal ligation 1966     Family History  Problem Relation Age of Onset  . Heart disease Father     died age 34- MI  . Alcohol abuse Brother   . Heart disease Brother      History   Social History  . Marital Status: Single    Spouse Name: N/A    Number of Children: 3  . 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 Smoking status: Never Smoker   . Smokeless tobacco: Never Used  . Alcohol Use: No  . Drug Use: Not on file  . Sexually Active: Not on file   Other Topics Concern  . Not on file   Social History Narrative   Has living willDesignated son Ronny Flurry to make health care decisions for herWould accept CPR/resuscitationHasn't considered tube feedings     ROS Constitutional: Negative for fever, chills, diaphoresis, activity change, appetite change. HENT: Negative for hearing loss, nosebleeds, congestion, sore throat, facial swelling, drooling, trouble swallowing, neck pain, voice change, sinus pressure and tinnitus.  Eyes: Negative for photophobia, pain, discharge and visual disturbance.  Respiratory: Negative for apnea, cough, chest tightness and wheezing.  Cardiovascular: Negative for chest pain,  and  leg swelling.  Gastrointestinal: Negative for nausea, vomiting, abdominal pain, diarrhea, constipation, blood in stool and abdominal distention.  Genitourinary: Negative for dysuria, urgency, frequency, hematuria and decreased urine volume.  Musculoskeletal: Negative for myalgias, back pain, joint swelling, arthralgias and gait problem.  Skin: Negative for color change, pallor, rash and wound.  Neurological: Negative for dizziness, tremors, seizures, syncope, speech difficulty, weakness, light-headedness, numbness and headaches.  Psychiatric/Behavioral: Negative for suicidal ideas, hallucinations, behavioral problems and agitation. The patient is nervous/anxious.      PHYSICAL EXAM   BP 155/85  Pulse 74  Ht 5\' 4"  (1.626 m)  Wt 253 lb (114.76 kg)  BMI 43.43 kg/m2 Constitutional: She is oriented to person, place, and time. She appears well-developed and well-nourished. No distress.  HENT: No nasal discharge.  Head: Normocephalic and atraumatic.  Eyes: Pupils are equal and round. Right eye exhibits no discharge. Left eye exhibits no discharge.  Neck: Normal range of motion. Neck supple. No JVD present. No thyromegaly present.  Cardiovascular: Normal rate, regular rhythm, normal heart sounds. Exam reveals no gallop and no friction rub. No murmur heard.  Pulmonary/Chest: Effort normal and breath sounds normal. No stridor. No respiratory distress. She has no wheezes. She has no rales. She exhibits no tenderness.  Abdominal: Soft. Bowel sounds are normal. She exhibits no distension. There is no tenderness. There is no rebound and no guarding.  Musculoskeletal: Normal range of motion. She exhibits no edema and no tenderness.  Neurological: She is alert and oriented to person, place, and time. Coordination normal.  Skin: Skin is warm and dry. No rash noted. She is not diaphoretic. No erythema. No pallor.  Psychiatric: She has a normal mood and affect. Her behavior is normal. Judgment and thought content normal.     EKG: Recent EKG was reviewed which showed sinus rhythm with sinus arrhythmia. Normal PR and QT intervals.   ASSESSMENT AND PLAN

## 2012-04-27 NOTE — Assessment & Plan Note (Signed)
The patient has been having recent episodes of palpitations described as very fast heartbeats radiating to her neck. The history is highly suggestive of supraventricular tachycardia. She did have these episodes frequently within a short period of time. However, she has not had any symptoms over the last few days. Due to that, I feel that a Holter monitor would not be sufficient in this situation. Due to that, I recommend a 14 day outpatient telemetry. I also recommend a transthoracic echocardiogram to look for other structural cardiac abnormalities. She is not reporting symptoms suggestive of angina at this time. She does not consume large amounts of caffeine and recent labs were unremarkable.

## 2012-05-08 ENCOUNTER — Encounter: Payer: Medicare Other | Admitting: Internal Medicine

## 2012-05-12 ENCOUNTER — Encounter: Payer: Medicare Other | Admitting: Internal Medicine

## 2012-05-12 ENCOUNTER — Other Ambulatory Visit (INDEPENDENT_AMBULATORY_CARE_PROVIDER_SITE_OTHER): Payer: Medicare Other

## 2012-05-12 ENCOUNTER — Other Ambulatory Visit: Payer: Self-pay

## 2012-05-12 DIAGNOSIS — R0600 Dyspnea, unspecified: Secondary | ICD-10-CM

## 2012-05-12 DIAGNOSIS — R011 Cardiac murmur, unspecified: Secondary | ICD-10-CM

## 2012-05-12 DIAGNOSIS — R0602 Shortness of breath: Secondary | ICD-10-CM

## 2012-05-15 ENCOUNTER — Ambulatory Visit (INDEPENDENT_AMBULATORY_CARE_PROVIDER_SITE_OTHER): Payer: Medicare Other | Admitting: Cardiovascular Disease

## 2012-05-15 ENCOUNTER — Other Ambulatory Visit: Payer: Self-pay | Admitting: *Deleted

## 2012-05-15 ENCOUNTER — Ambulatory Visit: Payer: Medicare Other | Admitting: Cardiovascular Disease

## 2012-05-15 ENCOUNTER — Encounter: Payer: Self-pay | Admitting: Cardiovascular Disease

## 2012-05-15 VITALS — BP 125/68 | HR 73 | Ht 65.0 in | Wt 256.0 lb

## 2012-05-15 DIAGNOSIS — R002 Palpitations: Secondary | ICD-10-CM

## 2012-05-15 MED ORDER — TRAMADOL HCL 50 MG PO TABS: 50.0000 mg | ORAL_TABLET | Freq: Two times a day (BID) | ORAL | Status: DC | PRN

## 2012-05-15 NOTE — Telephone Encounter (Signed)
Okay #120 x 0 

## 2012-05-15 NOTE — Patient Instructions (Signed)
Your heart tests were fine.  Let us know if palpitations get worse.  Follow up as needed.

## 2012-05-15 NOTE — Progress Notes (Signed)
HPI  This is a 71 year old female who is here today for a followup visit regarding palpitations. The patient reports no previous cardiac history. She had recent episodes of palpitations and fast heart beats. The first episode happened few weekends ago  while she was taking her grandson to church. The episode was sudden and lasted for about 10-15 minutes. She felt a pulsating feeling in her neck with slight dizziness. There was no syncope or presyncope. The next day, she did not feel well with symptoms of dizziness and nausea. The following days she had a few more episodes of tachycardia and palpitations but they did not last as long. She has not had any chest pain. She has been under stress lately related to significant illness of her son who is recovering from a hernia surgery with complications.  She underwent evaluation with a 14 day outpatient telemetry. It showed no evidence of arrhythmia. She had an echocardiogram which overall was unremarkable.  No Known Allergies   Current Outpatient Prescriptions on File Prior to Visit  Medication Sig Dispense Refill  . acetaminophen (TYLENOL) 500 MG tablet Take 500 mg by mouth every 6 (six) hours as needed.        Marland Kitchen amLODipine (NORVASC) 5 MG tablet Take 1 tablet (5 mg total) by mouth daily.  30 tablet  6  . Cholecalciferol (VITAMIN D-3 PO) Take by mouth daily.        . meclizine (ANTIVERT) 25 MG tablet Take 25 mg by mouth 3 (three) times daily as needed.       . meloxicam (MOBIC) 15 MG tablet Take 1 tablet (15 mg total) by mouth daily. As needed for arthritis pain   30 tablet  11     Past Medical History  Diagnosis Date  . Hypertension   . Osteoarthritis   . Venous insufficiency   . Urge incontinence      Past Surgical History  Procedure Date  . Appendectomy 1966  . Tubal ligation 1966     Family History  Problem Relation Age of Onset  . Heart disease Father     died age 84- MI  . Alcohol abuse Brother   . Heart disease Brother       History   Social History  . Marital Status: Single    Spouse Name: N/A    Number of Children: 3  . 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 Smoking status: Never Smoker   . Smokeless tobacco: Never Used  . Alcohol Use: No  . Drug Use: Not on file  . Sexually Active: Not on file   Other Topics Concern  . Not on file   Social History Narrative   Has living willDesignated son Ronny Flurry to make health care decisions for herWould accept CPR/resuscitationHasn't considered tube feedings      PHYSICAL EXAM   BP 125/68  Pulse 73  Ht 5\' 5"  (1.651 m)  Wt 256 lb (116.121 kg)  BMI 42.60 kg/m2 Constitutional: She is oriented to person, place, and time. She appears well-developed and well-nourished. No distress.  HENT: No nasal discharge.  Head: Normocephalic and atraumatic.  Eyes: Pupils are equal and round. Right eye exhibits no discharge. Left eye exhibits no discharge.  Neck: Normal range of motion. Neck supple. No JVD present. No thyromegaly present.  Cardiovascular: Normal rate, regular rhythm, normal heart sounds. Exam reveals no gallop and no friction rub.  No murmur heard.  Pulmonary/Chest: Effort normal and breath sounds normal. No stridor. No respiratory distress. She has no wheezes. She has no rales. She exhibits no tenderness.  Abdominal: Soft. Bowel sounds are normal. She exhibits no distension. There is no tenderness. There is no rebound and no guarding.  Musculoskeletal: Normal range of motion. She exhibits no edema and no tenderness.  Neurological: She is alert and oriented to person, place, and time. Coordination normal.  Skin: Skin is warm and dry. No rash noted. She is not diaphoretic. No erythema. No pallor.  Psychiatric: She has a normal mood and affect. Her behavior is normal. Judgment and thought content normal.      ASSESSMENT AND PLAN

## 2012-05-15 NOTE — Telephone Encounter (Signed)
rx sent to pharmacy by e-script  

## 2012-05-15 NOTE — Assessment & Plan Note (Signed)
The patient has been having recent episodes of palpitations described as very fast heartbeats radiating to Autumn Price neck. The history is highly suggestive of supraventricular tachycardia.  She had a 14 day outpatient telemetry which showed no evidence of arrhythmia. She reports having one episode of tachycardia while she was wearing the monitor no arrhythmia was documented. It is possible that some of Autumn Price symptoms are related to stress. Autumn Price echocardiogram was overall unremarkable.  The possibility of supraventricular tachycardia is still not excluded. I discussed with Autumn Price vagal maneuvers. If Autumn Price symptoms worsen, we can consider starting Autumn Price on a beta blocker. She will notify us if Autumn Price symptoms worsen.

## 2012-05-18 ENCOUNTER — Other Ambulatory Visit: Payer: Self-pay | Admitting: Cardiovascular Disease

## 2012-05-18 DIAGNOSIS — R002 Palpitations: Secondary | ICD-10-CM

## 2012-05-22 ENCOUNTER — Ambulatory Visit (INDEPENDENT_AMBULATORY_CARE_PROVIDER_SITE_OTHER): Payer: Medicare Other | Admitting: Internal Medicine

## 2012-05-22 ENCOUNTER — Encounter: Payer: Self-pay | Admitting: Internal Medicine

## 2012-05-22 VITALS — BP 128/70 | HR 67 | Temp 97.9°F | Ht 64.0 in | Wt 253.0 lb

## 2012-05-22 DIAGNOSIS — F430 Acute stress reaction: Secondary | ICD-10-CM

## 2012-05-22 DIAGNOSIS — M1990 Unspecified osteoarthritis, unspecified site: Secondary | ICD-10-CM

## 2012-05-22 DIAGNOSIS — F438 Other reactions to severe stress: Secondary | ICD-10-CM

## 2012-05-22 DIAGNOSIS — Z0000 Encounter for general adult medical examination without abnormal findings: Secondary | ICD-10-CM

## 2012-05-22 DIAGNOSIS — I10 Essential (primary) hypertension: Secondary | ICD-10-CM

## 2012-05-22 DIAGNOSIS — Z6841 Body Mass Index (BMI) 40.0 and over, adult: Secondary | ICD-10-CM

## 2012-05-22 DIAGNOSIS — E66813 Obesity, class 3: Secondary | ICD-10-CM | POA: Insufficient documentation

## 2012-05-22 DIAGNOSIS — Z1211 Encounter for screening for malignant neoplasm of colon: Secondary | ICD-10-CM

## 2012-05-22 MED ORDER — LORAZEPAM 0.5 MG PO TABS: 0.2500 mg | ORAL_TABLET | Freq: Two times a day (BID) | ORAL | Status: DC | PRN

## 2012-05-22 NOTE — Assessment & Plan Note (Signed)
Discussed Gave booklet "On your way to fitness"

## 2012-05-22 NOTE — Progress Notes (Signed)
Subjective:    Patient ID: Autumn Price, female    DOB: [birth year], 71 y.o.   MRN: [id number]  HPI Here for physical  Having stress with son Had hernia surgery and complications with infection and ongoing issues Feels she needs something for her nerves  Had cardiology evaluation for palpitations Echo normal Symptoms are better  Current Outpatient Prescriptions on File Prior to Visit  Medication Sig Dispense Refill  . acetaminophen (TYLENOL) 500 MG tablet Take 500 mg by mouth every 6 (six) hours as needed.        Marland Kitchen amLODipine (NORVASC) 5 MG tablet Take 1 tablet (5 mg total) by mouth daily.  30 tablet  6  . Cholecalciferol (VITAMIN D-3 PO) Take by mouth daily.        . meclizine (ANTIVERT) 25 MG tablet Take 25 mg by mouth 3 (three) times daily as needed.       . meloxicam (MOBIC) 15 MG tablet Take 1 tablet (15 mg total) by mouth daily. As needed for arthritis pain   30 tablet  11  . traMADol (ULTRAM) 50 MG tablet Take 1-2 tablets (50-100 mg total) by mouth 2 (two) times daily as needed for pain.  120 tablet  0    No Known Allergies  Past Medical History  Diagnosis Date  . Hypertension   . Osteoarthritis   . Venous insufficiency   . Urge incontinence     Past Surgical History  Procedure Date  . Appendectomy 1966  . Tubal ligation 1966    Family History  Problem Relation Age of Onset  . Heart disease Father     died age 45- MI  . Alcohol abuse Brother   . Heart disease Brother     History   Social History  . Marital Status: Divorced    Spouse Name: N/A    Number of Children: 3  . 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 Smoking status: Never Smoker   . Smokeless tobacco: Never Used  . Alcohol Use: No  . Drug Use: Not on file  . Sexually Active: Not on file   Other Topics Concern  . Not on file   Social History Narrative   Has living willDesignated son Ronny Flurry to  make health care decisions for herWould accept CPR/resuscitationHasn't considered tube feedings--would leave to son   Review of Systems  Constitutional: Negative for fatigue and unexpected weight change.       Wears seat belt  HENT: Negative for hearing loss, congestion, rhinorrhea, dental problem and tinnitus.        Regular with dentist   Eyes: Negative for visual disturbance.       No diplopia or unilateral vision change  Respiratory: Negative for cough, chest tightness and shortness of breath.   Cardiovascular: Positive for palpitations and leg swelling. Negative for chest pain.       Some right foot swelling ---only on feet at work  Gastrointestinal: Negative for nausea, vomiting, abdominal pain, constipation and blood in stool.       No heartburn  Genitourinary: Positive for urgency. Negative for dysuria and difficulty urinating.       AM urgency ---some leakage but only then No sexual problems  Musculoskeletal: Positive for arthralgias. Negative for back pain and joint swelling.       Chronic knee pain meloxicam and 2 tramadol prn help reasonably  No exercise other than walking at work  Skin: Negative for rash.       No suspicious lesions  Neurological: Negative for dizziness, syncope, weakness, light-headedness, numbness and headaches.  Hematological: Negative for adenopathy. Bruises/bleeds easily.       Always an easy bruiser--no change  Psychiatric/Behavioral: Negative for sleep disturbance and dysphoric mood. The patient is nervous/anxious.        Objective:   Physical Exam  Constitutional: She is oriented to person, place, and time. She appears well-developed and well-nourished. No distress.  HENT:  Head: Normocephalic and atraumatic.  Right Ear: External ear normal.  Left Ear: External ear normal.  Mouth/Throat: Oropharynx is clear and moist. No oropharyngeal exudate.  Eyes: Conjunctivae and EOM are normal. Pupils are equal, round, and reactive to light.  Neck:  Normal range of motion. Neck supple. No thyromegaly present.  Cardiovascular: Normal rate, regular rhythm, normal heart sounds and intact distal pulses.  Exam reveals no gallop.   No murmur heard. Pulmonary/Chest: Effort normal and breath sounds normal. No respiratory distress. She has no wheezes. She has no rales.  Abdominal: Soft. There is no tenderness.  Genitourinary:       Bilateral cystic lesions in breasts but no worrisome masses  Musculoskeletal: She exhibits no edema and no tenderness.  Lymphadenopathy:    She has no cervical adenopathy.    She has no axillary adenopathy.  Neurological: She is alert and oriented to person, place, and time.  Skin: No rash noted. No erythema.  Psychiatric: She has a normal mood and affect. Her behavior is normal. Thought content normal.          Assessment & Plan:

## 2012-05-22 NOTE — Assessment & Plan Note (Signed)
General good health but very poor fitness Discussed exercise and proper eating Info given on lifestyle Doesn't want flu vaccine and can't afford zostavax Mammogram every 2 years---due 2014

## 2012-05-22 NOTE — Assessment & Plan Note (Signed)
In knees Continues on current regimen

## 2012-05-22 NOTE — Assessment & Plan Note (Signed)
BP Readings from Last 3 Encounters:  05/22/12 128/70  05/15/12 125/68  04/14/12 155/85   Good control No changes Lab Results  Component Value Date   CREATININE 0.7 04/07/2012

## 2012-05-22 NOTE — Assessment & Plan Note (Signed)
With son's illness,etc Will give small Rx for lorazepam for prn

## 2012-06-03 ENCOUNTER — Other Ambulatory Visit: Payer: Medicare Other

## 2012-06-03 DIAGNOSIS — Z1211 Encounter for screening for malignant neoplasm of colon: Secondary | ICD-10-CM

## 2012-06-03 LAB — FECAL OCCULT BLOOD, IMMUNOCHEMICAL: Fecal Occult Bld: NEGATIVE

## 2012-06-04 ENCOUNTER — Encounter: Payer: Self-pay | Admitting: *Deleted

## 2012-06-15 ENCOUNTER — Other Ambulatory Visit: Payer: Self-pay | Admitting: *Deleted

## 2012-06-15 MED ORDER — TRAMADOL HCL 50 MG PO TABS: 50.0000 mg | ORAL_TABLET | Freq: Two times a day (BID) | ORAL | Status: DC | PRN

## 2012-06-15 NOTE — Telephone Encounter (Signed)
Okay #120 x 0 

## 2012-06-15 NOTE — Telephone Encounter (Signed)
rx sent to pharmacy by e-script  

## 2012-07-14 ENCOUNTER — Other Ambulatory Visit: Payer: Self-pay | Admitting: *Deleted

## 2012-07-14 MED ORDER — TRAMADOL HCL 50 MG PO TABS: 50.0000 mg | ORAL_TABLET | Freq: Two times a day (BID) | ORAL | Status: DC | PRN

## 2012-07-14 NOTE — Telephone Encounter (Signed)
Rx sent in

## 2012-07-14 NOTE — Telephone Encounter (Signed)
Received refill request from pharmacy. Last office visit 05/22/12. Is it okay to refill medication?

## 2012-07-14 NOTE — Telephone Encounter (Signed)
Okay #120 x 0 

## 2012-07-16 ENCOUNTER — Other Ambulatory Visit: Payer: Self-pay

## 2012-08-12 ENCOUNTER — Other Ambulatory Visit: Payer: Self-pay | Admitting: *Deleted

## 2012-08-12 DIAGNOSIS — F430 Acute stress reaction: Secondary | ICD-10-CM

## 2012-08-12 MED ORDER — TRAMADOL HCL 50 MG PO TABS: 50.0000 mg | ORAL_TABLET | Freq: Two times a day (BID) | ORAL | Status: DC | PRN

## 2012-08-12 MED ORDER — MELOXICAM 15 MG PO TABS: 15.0000 mg | ORAL_TABLET | Freq: Every day | ORAL | Status: DC

## 2012-08-12 MED ORDER — LORAZEPAM 0.5 MG PO TABS: 0.2500 mg | ORAL_TABLET | Freq: Two times a day (BID) | ORAL | Status: DC | PRN

## 2012-08-12 NOTE — Telephone Encounter (Signed)
Lorazepam last filled 05/22/12 Tramadol last filled 07/14/12 mobic last filled 07/14/12

## 2012-08-12 NOTE — Telephone Encounter (Signed)
rx called into pharmacy rx sent to pharmacy by e-script  

## 2012-08-12 NOTE — Telephone Encounter (Signed)
Okay mobic for a year  Tramadol #120 x 0 Lorazepam #30 x 0

## 2012-09-11 ENCOUNTER — Other Ambulatory Visit: Payer: Self-pay | Admitting: *Deleted

## 2012-09-11 MED ORDER — TRAMADOL HCL 50 MG PO TABS: 50.0000 mg | ORAL_TABLET | Freq: Two times a day (BID) | ORAL | Status: DC | PRN

## 2012-09-11 NOTE — Telephone Encounter (Signed)
rx sent to pharmacy by e-script  

## 2012-09-11 NOTE — Telephone Encounter (Signed)
Okay #120 x 0 

## 2012-09-15 ENCOUNTER — Telehealth: Payer: Self-pay | Admitting: Internal Medicine

## 2012-09-15 NOTE — Telephone Encounter (Signed)
The pt called the drug store and they informed her they did not have her ultram rx.  I told the pt it was called in on the 27th, but she is hoping we can re-fax it.

## 2012-09-15 NOTE — Telephone Encounter (Signed)
Spoke with patient and advised results, rx phoned in on 09/11/12

## 2012-10-12 ENCOUNTER — Other Ambulatory Visit: Payer: Self-pay | Admitting: *Deleted

## 2012-10-12 MED ORDER — TRAMADOL HCL 50 MG PO TABS: 50.0000 mg | ORAL_TABLET | Freq: Two times a day (BID) | ORAL | Status: DC | PRN

## 2012-10-12 MED ORDER — AMLODIPINE BESYLATE 5 MG PO TABS: 5.0000 mg | ORAL_TABLET | Freq: Every day | ORAL | Status: DC

## 2012-10-12 NOTE — Telephone Encounter (Signed)
rx sent to pharmacy by e-script  

## 2012-10-12 NOTE — Telephone Encounter (Signed)
Okay #120 x 0 

## 2012-10-14 NOTE — Telephone Encounter (Signed)
Pt called [facility] received amlodipine but not Tramadol. Spoke with [name] at [facility] and gave verbal order as instructed for Tramadol. Pt notified can pick up med. Apologized to pt.

## 2012-11-10 ENCOUNTER — Other Ambulatory Visit: Payer: Self-pay | Admitting: *Deleted

## 2012-11-10 MED ORDER — TRAMADOL HCL 50 MG PO TABS: 50.0000 mg | ORAL_TABLET | Freq: Two times a day (BID) | ORAL | Status: DC | PRN

## 2012-11-13 ENCOUNTER — Other Ambulatory Visit: Payer: Self-pay | Admitting: *Deleted

## 2012-11-13 MED ORDER — TRAMADOL HCL 50 MG PO TABS: 50.0000 mg | ORAL_TABLET | Freq: Two times a day (BID) | ORAL | Status: DC | PRN

## 2012-11-16 NOTE — Telephone Encounter (Signed)
Pt called status of Tramadol refill. Spoke with [name] at [facility], did not receive refill.Medication phoned to [facility] as instructed.pt notified refill done.

## 2012-12-11 ENCOUNTER — Other Ambulatory Visit: Payer: Self-pay | Admitting: *Deleted

## 2012-12-11 MED ORDER — TRAMADOL HCL 50 MG PO TABS: 50.0000 mg | ORAL_TABLET | Freq: Two times a day (BID) | ORAL | Status: DC | PRN

## 2012-12-11 NOTE — Telephone Encounter (Signed)
Okay #120 x 0 

## 2012-12-11 NOTE — Telephone Encounter (Signed)
rx sent to pharmacy by e-script  

## 2013-01-11 ENCOUNTER — Other Ambulatory Visit: Payer: Self-pay | Admitting: *Deleted

## 2013-01-11 MED ORDER — TRAMADOL HCL 50 MG PO TABS: 50.0000 mg | ORAL_TABLET | Freq: Two times a day (BID) | ORAL | Status: DC | PRN

## 2013-02-11 ENCOUNTER — Other Ambulatory Visit: Payer: Self-pay | Admitting: *Deleted

## 2013-02-11 MED ORDER — TRAMADOL HCL 50 MG PO TABS: 50.0000 mg | ORAL_TABLET | Freq: Two times a day (BID) | ORAL | Status: DC | PRN

## 2013-02-11 NOTE — Telephone Encounter (Signed)
rx sent to pharmacy by e-script  

## 2013-02-11 NOTE — Telephone Encounter (Signed)
Okay #120 x 0 

## 2013-02-11 NOTE — Telephone Encounter (Signed)
Last filled 11/21/13 

## 2013-03-11 ENCOUNTER — Other Ambulatory Visit: Payer: Self-pay | Admitting: *Deleted

## 2013-03-11 MED ORDER — TRAMADOL HCL 50 MG PO TABS: 50.0000 mg | ORAL_TABLET | Freq: Two times a day (BID) | ORAL | Status: DC | PRN

## 2013-03-11 NOTE — Telephone Encounter (Signed)
Last filled 02/11/13 

## 2013-03-11 NOTE — Telephone Encounter (Signed)
Okay #120 x 0 

## 2013-03-11 NOTE — Telephone Encounter (Signed)
rx sent to pharmacy by e-script  

## 2013-04-09 ENCOUNTER — Other Ambulatory Visit: Payer: Self-pay | Admitting: *Deleted

## 2013-04-09 MED ORDER — TRAMADOL HCL 50 MG PO TABS: 50.0000 mg | ORAL_TABLET | Freq: Two times a day (BID) | ORAL | Status: DC | PRN

## 2013-04-09 NOTE — Telephone Encounter (Signed)
I accidentally signed the refill order for Tramadol. I immediately called the pharmacy and the pharmacist canceled and deleted that escript. He is aware that another escript may be coming after approval from Dr. Rx has been routed to Dr and is pending approval.

## 2013-04-09 NOTE — Telephone Encounter (Signed)
Okay to refill #120 x 0

## 2013-04-09 NOTE — Telephone Encounter (Signed)
rx sent to pharmacy by e-script  

## 2013-05-13 ENCOUNTER — Other Ambulatory Visit: Payer: Self-pay | Admitting: *Deleted

## 2013-05-13 MED ORDER — TRAMADOL HCL 50 MG PO TABS: 50.0000 mg | ORAL_TABLET | Freq: Two times a day (BID) | ORAL | Status: DC | PRN

## 2013-05-13 MED ORDER — AMLODIPINE BESYLATE 5 MG PO TABS: 5.0000 mg | ORAL_TABLET | Freq: Every day | ORAL | Status: DC

## 2013-05-13 NOTE — Telephone Encounter (Signed)
rx sent to pharmacy by e-script  

## 2013-05-13 NOTE — Telephone Encounter (Signed)
Okay amlodipine for a year Tramadol #120 x 0

## 2013-05-21 ENCOUNTER — Ambulatory Visit (INDEPENDENT_AMBULATORY_CARE_PROVIDER_SITE_OTHER): Payer: Medicare Other | Admitting: Internal Medicine

## 2013-05-21 ENCOUNTER — Encounter: Payer: Self-pay | Admitting: Internal Medicine

## 2013-05-21 VITALS — BP 130/70 | HR 74 | Temp 98.0°F | Ht 64.0 in | Wt 253.0 lb

## 2013-05-21 DIAGNOSIS — I10 Essential (primary) hypertension: Secondary | ICD-10-CM

## 2013-05-21 DIAGNOSIS — Z1211 Encounter for screening for malignant neoplasm of colon: Secondary | ICD-10-CM

## 2013-05-21 DIAGNOSIS — Z6841 Body Mass Index (BMI) 40.0 and over, adult: Secondary | ICD-10-CM

## 2013-05-21 DIAGNOSIS — M159 Polyosteoarthritis, unspecified: Secondary | ICD-10-CM

## 2013-05-21 DIAGNOSIS — Z0000 Encounter for general adult medical examination without abnormal findings: Secondary | ICD-10-CM

## 2013-05-21 LAB — CBC WITH DIFFERENTIAL/PLATELET
Basophils Absolute: 0 10*3/uL (ref 0.0–0.1)
Basophils Relative: 1.2 % (ref 0.0–3.0)
Eosinophils Absolute: 0.1 10*3/uL (ref 0.0–0.7)
Eosinophils Relative: 2.8 % (ref 0.0–5.0)
HCT: 41.6 % (ref 36.0–46.0)
Hemoglobin: 13.7 g/dL (ref 12.0–15.0)
Lymphocytes Relative: 29 % (ref 12.0–46.0)
Lymphs Abs: 1.2 10*3/uL (ref 0.7–4.0)
MCHC: 33.1 g/dL (ref 30.0–36.0)
MCV: 88.9 fl (ref 78.0–100.0)
Monocytes Absolute: 0.4 10*3/uL (ref 0.1–1.0)
Monocytes Relative: 10.9 % (ref 3.0–12.0)
Neutro Abs: 2.3 10*3/uL (ref 1.4–7.7)
Neutrophils Relative %: 56.1 % (ref 43.0–77.0)
Platelets: 129 10*3/uL — ABNORMAL LOW (ref 150.0–400.0)
RBC: 4.68 Mil/uL (ref 3.87–5.11)
RDW: 13.9 % (ref 11.5–14.6)
WBC: 4.1 10*3/uL — ABNORMAL LOW (ref 4.5–10.5)

## 2013-05-21 LAB — HEPATIC FUNCTION PANEL
ALT: 16 U/L (ref 0–35)
AST: 20 U/L (ref 0–37)
Albumin: 4 g/dL (ref 3.5–5.2)
Alkaline Phosphatase: 70 U/L (ref 39–117)
Bilirubin, Direct: 0.1 mg/dL (ref 0.0–0.3)
Total Bilirubin: 0.9 mg/dL (ref 0.3–1.2)
Total Protein: 7.1 g/dL (ref 6.0–8.3)

## 2013-05-21 LAB — BASIC METABOLIC PANEL
BUN: 16 mg/dL (ref 6–23)
CO2: 26 mEq/L (ref 19–32)
Calcium: 9.5 mg/dL (ref 8.4–10.5)
Chloride: 106 mEq/L (ref 96–112)
Creatinine, Ser: 0.7 mg/dL (ref 0.4–1.2)
GFR: 86.55 mL/min (ref >60.00)
Glucose, Bld: 95 mg/dL (ref 70–99)
Potassium: 4.3 mEq/L (ref 3.5–5.1)
Sodium: 142 mEq/L (ref 135–145)

## 2013-05-21 LAB — TSH: TSH: 0.83 u[IU]/mL (ref 0.35–5.50)

## 2013-05-21 NOTE — Assessment & Plan Note (Signed)
Mostly knees but nodules on DIP Will try to see if meloxicam can be prn

## 2013-05-21 NOTE — Patient Instructions (Addendum)
[facility] Please set up your screening mammogram.  DASH Diet The DASH diet stands for "Dietary Approaches to Stop Hypertension." It is a healthy eating plan that has been shown to reduce high blood pressure (hypertension) in as little as 14 days, while also possibly providing other significant health benefits. These other health benefits include reducing the risk of breast cancer after menopause and reducing the risk of type 2 diabetes, heart disease, colon cancer, and stroke. Health benefits also include weight loss and slowing kidney failure in patients with chronic kidney disease.  DIET GUIDELINES  Limit salt (sodium). Your diet should contain less than 1500 mg of sodium daily.  Limit refined or processed carbohydrates. Your diet should include mostly whole grains. Desserts and added sugars should be used sparingly.  Include small amounts of heart-healthy fats. These types of fats include nuts, oils, and tub margarine. Limit saturated and trans fats. These fats have been shown to be harmful in the body. CHOOSING FOODS  The following food groups are based on a 2000 calorie diet. See your Registered Dietitian for individual calorie needs. Grains and Grain Products (6 to 8 servings daily)  Eat More Often: Whole-wheat bread, brown rice, whole-grain or wheat pasta, quinoa, popcorn without added fat or salt (air popped).  Eat Less Often: White bread, white pasta, white rice, cornbread. Vegetables (4 to 5 servings daily)  Eat More Often: Fresh, frozen, and canned vegetables. Vegetables may be raw, steamed, roasted, or grilled with a minimal amount of fat.  Eat Less Often/Avoid: Creamed or fried vegetables. Vegetables in a cheese sauce. Fruit (4 to 5 servings daily)  Eat More Often: All fresh, canned (in natural juice), or frozen fruits. Dried fruits without added sugar. One hundred percent fruit juice ( cup [237 mL] daily).  Eat Less Often: Dried fruits  with added sugar. Canned fruit in light or heavy syrup. [employer], Fish, and Poultry (2 servings or less daily. One serving is 3 to 4 oz [85-114 g]).  Eat More Often: Ninety percent or leaner ground beef, tenderloin, sirloin. Round cuts of beef, chicken breast, [place] breast. All fish. Grill, bake, or broil your meat. Nothing should be fried.  Eat Less Often/Avoid: Fatty cuts of meat, [place], or chicken leg, thigh, or wing. Fried cuts of meat or fish. Dairy (2 to 3 servings)  Eat More Often: Low-fat or fat-free milk, low-fat plain or light yogurt, reduced-fat or part-skim cheese.  Eat Less Often/Avoid: Milk (whole, 2%).Whole milk yogurt. Full-fat cheeses. Nuts, Seeds, and Legumes (4 to 5 servings per week)  Eat More Often: All without added salt.  Eat Less Often/Avoid: Salted nuts and seeds, canned beans with added salt. Fats and Sweets (limited)  Eat More Often: Vegetable oils, tub margarines without trans fats, sugar-free gelatin. Mayonnaise and salad dressings.  Eat Less Often/Avoid: Coconut oils, palm oils, butter, stick margarine, cream, half and half, cookies, candy, pie. FOR MORE INFORMATION The Dash Diet Eating Plan: www.dashdiet.org Document Released: 11/21/2011 Document Revised: 02/24/2012 Document Reviewed: 11/21/2011 [facility] Patient Information 2014 [place], [place].

## 2013-05-21 NOTE — Assessment & Plan Note (Signed)
Discussed DASH diet and silver sneakers

## 2013-05-21 NOTE — Assessment & Plan Note (Signed)
Generally healthy but poor condition Discussed fitness Check fecal immunoassay Set up screening mammogram

## 2013-05-21 NOTE — Assessment & Plan Note (Signed)
BP Readings from Last 3 Encounters:  05/21/13 130/70  05/22/12 128/70  05/15/12 125/68   Good control Due for labs

## 2013-05-21 NOTE — Progress Notes (Signed)
Subjective:    Patient ID: Autumn Price, female    DOB: [birth year], 72 y.o.   MRN: [id number]  HPI Here for physical Has had 2 falls in past year Still working Reviewed advanced directives  Tries to walk a little--but afraid to walk Discussed joining [employer] Not particularly careful with diet  No new concerns  Current Outpatient Prescriptions on File Prior to Visit  Medication Sig Dispense Refill  . acetaminophen (TYLENOL) 500 MG tablet Take 500 mg by mouth every 6 (six) hours as needed.        Marland Kitchen amLODipine (NORVASC) 5 MG tablet Take 1 tablet (5 mg total) by mouth daily.  30 tablet  11  . meloxicam (MOBIC) 15 MG tablet Take 1 tablet (15 mg total) by mouth daily. As needed for arthritis pain  30 tablet  11  . traMADol (ULTRAM) 50 MG tablet Take 1-2 tablets (50-100 mg total) by mouth 2 (two) times daily as needed for pain.  120 tablet  0   No current facility-administered medications on file prior to visit.    No Known Allergies  Past Medical History  Diagnosis Date  . Hypertension   . Osteoarthritis   . Venous insufficiency   . Urge incontinence     Past Surgical History  Procedure Laterality Date  . Appendectomy  1966  . Tubal ligation  1966    Family History  Problem Relation Age of Onset  . Heart disease Father     died age 24- MI  . Alcohol abuse Brother   . Heart disease Brother     History   Social History  . Marital Status: Divorced    Spouse Name: N/A    Number of Children: 3  . 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 Smoking status: Never Smoker   . Smokeless tobacco: Never Used  . Alcohol Use: No  . Drug Use: Not on file  . Sexually Active: Not on file   Other Topics Concern  . Not on file   Social History Narrative   Has living will   Designated son Ronny Flurry to make health care decisions for her   Would accept CPR/resuscitation   Hasn't  considered tube feedings--would leave to son   Review of Systems  Constitutional: Negative for fatigue and unexpected weight change.       Wears seat belt  HENT: Negative for hearing loss, congestion, rhinorrhea, dental problem and tinnitus.        Regular with dentist  Eyes: Negative for visual disturbance.       No diplopia or unilateral vision loss  Respiratory: Negative for cough, chest tightness and shortness of breath.   Cardiovascular: Positive for leg swelling. Negative for chest pain and palpitations.       Feet swelling in the past few weeks--- no pain  Gastrointestinal: Negative for nausea, vomiting, abdominal pain, constipation and blood in stool.  Endocrine: Positive for heat intolerance. Negative for cold intolerance.  Genitourinary: Negative for dysuria, hematuria and difficulty urinating.       Urge incontinence only first thing in the morning--wears pad  Musculoskeletal: Positive for arthralgias. Negative for back pain and joint swelling.       Usual knee pain  Skin: Negative for rash.       No suspicious lesions  Allergic/Immunologic: Negative for environmental allergies and immunocompromised state.  Neurological:  Positive for headaches. Negative for dizziness and light-headedness.       Occasional mild headaches  Hematological: Negative for adenopathy. Bruises/bleeds easily.  Psychiatric/Behavioral: Negative for sleep disturbance and dysphoric mood. The patient is not nervous/anxious.        Objective:   Physical Exam  Constitutional: She is oriented to person, place, and time. She appears well-developed and well-nourished. No distress.  HENT:  Head: Normocephalic and atraumatic.  Right Ear: External ear normal.  Left Ear: External ear normal.  Mouth/Throat: Oropharynx is clear and moist. No oropharyngeal exudate.  Eyes: Conjunctivae and EOM are normal. Pupils are equal, round, and reactive to light.  Neck: Normal range of motion. Neck supple. No thyromegaly  present.  Cardiovascular: Normal rate, regular rhythm, normal heart sounds and intact distal pulses.  Exam reveals no gallop.   No murmur heard. Pulmonary/Chest: Effort normal and breath sounds normal. No respiratory distress. She has no wheezes. She has no rales.  Abdominal: Soft. There is no tenderness.  Genitourinary:  Bilateral cystic changes in breasts--no suspcious masses  Musculoskeletal: She exhibits no edema and no tenderness.  Lymphadenopathy:    She has no cervical adenopathy.    She has no axillary adenopathy.  Neurological: She is alert and oriented to person, place, and time.  Skin: No rash noted. No erythema.  Psychiatric: She has a normal mood and affect. Her behavior is normal.          Assessment & Plan:

## 2013-05-28 ENCOUNTER — Encounter: Payer: Self-pay | Admitting: *Deleted

## 2013-06-03 ENCOUNTER — Other Ambulatory Visit (INDEPENDENT_AMBULATORY_CARE_PROVIDER_SITE_OTHER): Payer: Medicare Other

## 2013-06-03 DIAGNOSIS — Z1211 Encounter for screening for malignant neoplasm of colon: Secondary | ICD-10-CM

## 2013-06-03 LAB — FECAL OCCULT BLOOD, IMMUNOCHEMICAL: Fecal Occult Bld: NEGATIVE

## 2013-06-08 ENCOUNTER — Encounter: Payer: Self-pay | Admitting: *Deleted

## 2013-06-16 ENCOUNTER — Other Ambulatory Visit: Payer: Self-pay | Admitting: *Deleted

## 2013-06-16 MED ORDER — TRAMADOL HCL 50 MG PO TABS: 50.0000 mg | ORAL_TABLET | Freq: Two times a day (BID) | ORAL | Status: DC | PRN

## 2013-06-16 NOTE — Telephone Encounter (Signed)
rx sent to pharmacy by e-script  

## 2013-06-16 NOTE — Telephone Encounter (Signed)
Received faxed refill request from pharmacy. Last office visit 05/21/13. Is it okay to refill medication? 

## 2013-06-16 NOTE — Telephone Encounter (Signed)
Okay #120 x 0 

## 2013-07-09 ENCOUNTER — Other Ambulatory Visit: Payer: Self-pay | Admitting: *Deleted

## 2013-07-09 NOTE — Telephone Encounter (Signed)
Can hold till Monday Then okay to fill #120

## 2013-07-09 NOTE — Telephone Encounter (Signed)
ok 

## 2013-07-09 NOTE — Telephone Encounter (Signed)
Last filled 06/16/13 

## 2013-07-09 NOTE — Telephone Encounter (Signed)
Let her know it is too soon---we can't refill this till next week

## 2013-07-09 NOTE — Telephone Encounter (Signed)
She states because she have such a hard time getting the refill she started a week early, please advise

## 2013-07-12 ENCOUNTER — Other Ambulatory Visit: Payer: Self-pay | Admitting: *Deleted

## 2013-07-12 MED ORDER — TRAMADOL HCL 50 MG PO TABS: 50.0000 mg | ORAL_TABLET | Freq: Two times a day (BID) | ORAL | Status: DC | PRN

## 2013-07-12 NOTE — Telephone Encounter (Signed)
rx called into pharmacy Phone lines are down, used my cell phone to phone this in

## 2013-08-12 ENCOUNTER — Other Ambulatory Visit: Payer: Self-pay | Admitting: *Deleted

## 2013-08-12 MED ORDER — MELOXICAM 15 MG PO TABS: 15.0000 mg | ORAL_TABLET | Freq: Every day | ORAL | Status: DC

## 2013-08-12 MED ORDER — TRAMADOL HCL 50 MG PO TABS: 50.0000 mg | ORAL_TABLET | Freq: Two times a day (BID) | ORAL | Status: DC | PRN

## 2013-08-12 NOTE — Telephone Encounter (Signed)
rx called into pharmacy

## 2013-08-12 NOTE — Telephone Encounter (Signed)
Okay tramadol #120 x 0  meloxicam for a year

## 2013-08-26 ENCOUNTER — Encounter: Payer: Self-pay | Admitting: Radiology

## 2013-08-27 ENCOUNTER — Other Ambulatory Visit (INDEPENDENT_AMBULATORY_CARE_PROVIDER_SITE_OTHER): Payer: Medicare Other

## 2013-08-27 ENCOUNTER — Encounter: Payer: Self-pay | Admitting: *Deleted

## 2013-08-27 DIAGNOSIS — D696 Thrombocytopenia, unspecified: Secondary | ICD-10-CM

## 2013-08-27 LAB — CBC WITH DIFFERENTIAL/PLATELET
Basophils Absolute: 0 10*3/uL (ref 0.0–0.1)
Basophils Relative: 1 % (ref 0.0–3.0)
Eosinophils Absolute: 0.1 10*3/uL (ref 0.0–0.7)
Eosinophils Relative: 2.5 % (ref 0.0–5.0)
HCT: 42.3 % (ref 36.0–46.0)
Hemoglobin: 14.1 g/dL (ref 12.0–15.0)
Lymphocytes Relative: 27.9 % (ref 12.0–46.0)
Lymphs Abs: 1.3 10*3/uL (ref 0.7–4.0)
MCHC: 33.3 g/dL (ref 30.0–36.0)
MCV: 86.5 fl (ref 78.0–100.0)
Monocytes Absolute: 0.5 10*3/uL (ref 0.1–1.0)
Monocytes Relative: 11.1 % (ref 3.0–12.0)
Neutro Abs: 2.8 10*3/uL (ref 1.4–7.7)
Neutrophils Relative %: 57.5 % (ref 43.0–77.0)
Platelets: 158 10*3/uL (ref 150.0–400.0)
RBC: 4.89 Mil/uL (ref 3.87–5.11)
RDW: 13.7 % (ref 11.5–14.6)
WBC: 4.8 10*3/uL (ref 4.5–10.5)

## 2013-09-13 ENCOUNTER — Other Ambulatory Visit: Payer: Self-pay | Admitting: *Deleted

## 2013-09-13 NOTE — Telephone Encounter (Signed)
Received faxed refill request from pharmacy. Last office visit 05/21/13. Is it okay to refill medication?

## 2013-09-14 MED ORDER — TRAMADOL HCL 50 MG PO TABS: 50.0000 mg | ORAL_TABLET | Freq: Two times a day (BID) | ORAL | Status: DC | PRN

## 2013-09-14 NOTE — Telephone Encounter (Signed)
Okay #120 x 0 

## 2013-09-14 NOTE — Telephone Encounter (Signed)
rx called into pharmacy

## 2013-10-12 ENCOUNTER — Other Ambulatory Visit: Payer: Self-pay | Admitting: *Deleted

## 2013-10-12 MED ORDER — TRAMADOL HCL 50 MG PO TABS: 50.0000 mg | ORAL_TABLET | Freq: Two times a day (BID) | ORAL | Status: DC | PRN

## 2013-10-12 NOTE — Telephone Encounter (Signed)
Okay #120 x 0 

## 2013-10-12 NOTE — Telephone Encounter (Signed)
rx called into pharmacy

## 2013-10-12 NOTE — Telephone Encounter (Signed)
Last filled 09/13/13. 

## 2013-11-10 ENCOUNTER — Other Ambulatory Visit: Payer: Self-pay | Admitting: *Deleted

## 2013-11-10 MED ORDER — TRAMADOL HCL 50 MG PO TABS: 50.0000 mg | ORAL_TABLET | Freq: Two times a day (BID) | ORAL | Status: DC | PRN

## 2013-11-10 NOTE — Telephone Encounter (Signed)
Last filled 10/12/13 [facility] PATIENT, Please send back to me for call in

## 2013-11-10 NOTE — Telephone Encounter (Signed)
Px written for call in  thanks 

## 2013-11-10 NOTE — Telephone Encounter (Signed)
rx called into pharmacy

## 2013-12-08 ENCOUNTER — Other Ambulatory Visit: Payer: Self-pay | Admitting: *Deleted

## 2013-12-08 MED ORDER — TRAMADOL HCL 50 MG PO TABS: 50.0000 mg | ORAL_TABLET | Freq: Two times a day (BID) | ORAL | Status: DC | PRN

## 2013-12-08 NOTE — Telephone Encounter (Signed)
Received faxed refill request from pharmacy. Last refill 11/10/13 #120, last office visit 05/21/13. Is it okay to refill medication?

## 2013-12-08 NOTE — Telephone Encounter (Signed)
rx called into pharmacy

## 2013-12-08 NOTE — Telephone Encounter (Signed)
Okay #120 x 0 

## 2013-12-22 ENCOUNTER — Ambulatory Visit (INDEPENDENT_AMBULATORY_CARE_PROVIDER_SITE_OTHER): Payer: Medicare Other | Admitting: Internal Medicine

## 2013-12-22 ENCOUNTER — Encounter: Payer: Self-pay | Admitting: Internal Medicine

## 2013-12-22 ENCOUNTER — Ambulatory Visit: Payer: Self-pay | Admitting: Internal Medicine

## 2013-12-22 ENCOUNTER — Telehealth: Payer: Self-pay

## 2013-12-22 ENCOUNTER — Other Ambulatory Visit (HOSPITAL_COMMUNITY)
Admission: RE | Admit: 2013-12-22 | Discharge: 2013-12-22 | Disposition: A | Discharge status: Home / Self Care | Payer: Medicare Other | Source: Ambulatory Visit | Attending: Internal Medicine | Admitting: Internal Medicine

## 2013-12-22 VITALS — BP 160/60 | HR 72 | Temp 97.8°F | Wt 244.5 lb

## 2013-12-22 DIAGNOSIS — Z124 Encounter for screening for malignant neoplasm of cervix: Secondary | ICD-10-CM | POA: Insufficient documentation

## 2013-12-22 DIAGNOSIS — Z1151 Encounter for screening for human papillomavirus (HPV): Secondary | ICD-10-CM | POA: Insufficient documentation

## 2013-12-22 DIAGNOSIS — N950 Postmenopausal bleeding: Secondary | ICD-10-CM | POA: Insufficient documentation

## 2013-12-22 NOTE — Progress Notes (Signed)
   Subjective:    Patient ID: Edmonia CaprioWanda Triplett, female    DOB: 01-31-1941, 73 y.o.   MRN: [id number]  HPI Had some pink vaginal discharge before Christmas This has recurred a few times Last night she noticed blood on the toilet paper No blood in urine--doesn't seem to be bladder related Bright red  No intercourse No vaginal pain  Did have cortisone injection in early December--each knee Will be trying hyaluronate maybe  Current Outpatient Prescriptions on File Prior to Visit  Medication Sig Dispense Refill  . acetaminophen (TYLENOL) 500 MG tablet Take 500 mg by mouth every 6 (six) hours as needed.        Marland Kitchen. amLODipine (NORVASC) 5 MG tablet Take 1 tablet (5 mg total) by mouth daily.  30 tablet  11  . meloxicam (MOBIC) 15 MG tablet Take 1 tablet (15 mg total) by mouth daily. As needed for arthritis pain  30 tablet  11  . Multiple Vitamins-Minerals (CENTRUM SILVER ULTRA WOMENS) TABS Take 1 tablet by mouth daily.      . traMADol (ULTRAM) 50 MG tablet Take 1-2 tablets (50-100 mg total) by mouth 2 (two) times daily as needed.  120 tablet  0   No current facility-administered medications on file prior to visit.    No Known Allergies  Past Medical History  Diagnosis Date  . Hypertension   . Osteoarthritis   . Venous insufficiency   . Urge incontinence     Past Surgical History  Procedure Laterality Date  . Appendectomy  1966  . Tubal ligation  1966    Family History  Problem Relation Age of Onset  . Heart disease Father     died age 71- MI  . Alcohol abuse Brother   . Heart disease Brother     History   Social History  . Marital Status: Divorced    Spouse Name: N/A    Number of Children: 3  . 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 Smoking status: Never Smoker   . Smokeless tobacco: Never Used  . Alcohol Use: No  . Drug Use: No  . Sexual Activity: Not on file   Other Topics  Concern  . Not on file   Social History Narrative   Has living will   Designated son [name] to make health care decisions for her   Would accept CPR/resuscitation   Hasn't considered tube feedings--would leave to son   Review of Systems No fever Not sick Bowels are regular    Objective:   Physical Exam  Constitutional: She appears well-developed and well-nourished. No distress.  Genitourinary:  No external lesions Urethra appears normal Has blood in the vault Cervix looks normal Limited bimanual is negative Pap done          Assessment & Plan:

## 2013-12-22 NOTE — Assessment & Plan Note (Addendum)
Considerable blood in the vault so can't be bladder related Pap done Will set up ultrasound  [facility] If she needs gyn, will send her there

## 2013-12-22 NOTE — Telephone Encounter (Signed)
[facility] called pelvic US report; Uterus; there is a 1.1 cm intramural fibroid along the posterior uterine body.Endometrium thickened toward fundus; measures up to 8mm. Right ovary normal. Left ovary poorly visualized. No free fluid. Pt has already gone home. Complete report to follow. Dr Milinda Antisower reviewed OKed to send to Dr Alphonsus SiasLetvak.

## 2013-12-22 NOTE — Addendum Note (Signed)
Addended by: Josph MachoANCE, Derward Marple A on: 12/22/2013 01:07 PM   Modules accepted: Orders

## 2013-12-22 NOTE — Progress Notes (Signed)
Pre-visit discussion using our clinic review tool. No additional management support is needed unless otherwise documented below in the visit note.  

## 2013-12-23 NOTE — Telephone Encounter (Signed)
Discussed results Not overly worrisome but wouldn't want to presume that the fibroid is the reason for the bleeding  Will proceed with the gyn referral  Will probably need endometrial biopsy

## 2013-12-27 ENCOUNTER — Encounter: Payer: Self-pay | Admitting: *Deleted

## 2014-01-10 ENCOUNTER — Telehealth: Payer: Self-pay

## 2014-01-10 NOTE — Telephone Encounter (Signed)
Last filled 12/08/13-- #120--please advise

## 2014-01-10 NOTE — Telephone Encounter (Signed)
If this is a request for the tramadol, okay #120 x 0

## 2014-01-11 MED ORDER — TRAMADOL HCL 50 MG PO TABS: 50.0000 mg | ORAL_TABLET | Freq: Two times a day (BID) | ORAL | Status: DC | PRN

## 2014-01-11 NOTE — Telephone Encounter (Signed)
rx called into pharmacy

## 2014-01-20 ENCOUNTER — Ambulatory Visit: Payer: Self-pay | Admitting: Obstetrics and Gynecology

## 2014-01-20 LAB — COMPREHENSIVE METABOLIC PANEL
Albumin: 3.9 g/dL (ref 3.4–5.0)
Alkaline Phosphatase: 95 U/L
Anion Gap: 3 — ABNORMAL LOW (ref 7–16)
BUN: 13 mg/dL (ref 7–18)
Bilirubin,Total: 0.5 mg/dL (ref 0.2–1.0)
Calcium, Total: 9.6 mg/dL (ref 8.5–10.1)
Chloride: 106 mmol/L (ref 98–107)
Co2: 33 mmol/L — ABNORMAL HIGH (ref 21–32)
Creatinine: 0.72 mg/dL (ref 0.60–1.30)
EGFR (African American): >60
EGFR (Non-African Amer.): >60
Glucose: 129 mg/dL — ABNORMAL HIGH (ref 65–99)
Osmolality: 285 (ref 275–301)
Potassium: 4.4 mmol/L (ref 3.5–5.1)
SGOT(AST): 13 U/L — ABNORMAL LOW (ref 15–37)
SGPT (ALT): 21 U/L (ref 12–78)
Sodium: 142 mmol/L (ref 136–145)
Total Protein: 7.4 g/dL (ref 6.4–8.2)

## 2014-01-20 LAB — CBC
HCT: 42.9 % (ref 35.0–47.0)
HGB: 14.3 g/dL (ref 12.0–16.0)
MCH: 29.7 pg (ref 26.0–34.0)
MCHC: 33.4 g/dL (ref 32.0–36.0)
MCV: 89 fL (ref 80–100)
Platelet: 160 10*3/uL (ref 150–440)
RBC: 4.82 10*6/uL (ref 3.80–5.20)
RDW: 14.3 % (ref 11.5–14.5)
WBC: 5.8 10*3/uL (ref 3.6–11.0)

## 2014-01-27 ENCOUNTER — Ambulatory Visit: Payer: Self-pay | Admitting: Obstetrics and Gynecology

## 2014-01-27 HISTORY — PX: DILATION AND CURETTAGE OF UTERUS: SHX78

## 2014-02-02 LAB — PATHOLOGY REPORT

## 2014-02-11 ENCOUNTER — Other Ambulatory Visit: Payer: Self-pay | Admitting: *Deleted

## 2014-02-11 NOTE — Telephone Encounter (Signed)
Last filled 01/11/14

## 2014-02-12 NOTE — Telephone Encounter (Signed)
Okay #120 x 0 

## 2014-02-14 MED ORDER — TRAMADOL HCL 50 MG PO TABS: 50.0000 mg | ORAL_TABLET | Freq: Two times a day (BID) | ORAL | Status: DC | PRN

## 2014-02-14 NOTE — Telephone Encounter (Signed)
rx called into pharmacy

## 2014-03-15 ENCOUNTER — Other Ambulatory Visit: Payer: Self-pay | Admitting: *Deleted

## 2014-03-15 NOTE — Telephone Encounter (Signed)
02/14/14 

## 2014-03-16 MED ORDER — TRAMADOL HCL 50 MG PO TABS: 50.0000 mg | ORAL_TABLET | Freq: Two times a day (BID) | ORAL | Status: DC | PRN

## 2014-03-16 NOTE — Telephone Encounter (Signed)
Okay #120 x 0 

## 2014-03-16 NOTE — Telephone Encounter (Signed)
rx called into pharmacy

## 2014-04-12 ENCOUNTER — Other Ambulatory Visit: Payer: Self-pay | Admitting: *Deleted

## 2014-04-12 NOTE — Telephone Encounter (Signed)
Left vm for pt to return call  

## 2014-04-12 NOTE — Telephone Encounter (Signed)
Too early Let her know she is not due till next week

## 2014-04-12 NOTE — Telephone Encounter (Signed)
Last filled 03/22/14, last ov was 12/22/13. Too early or ok to fill?

## 2014-04-13 ENCOUNTER — Other Ambulatory Visit: Payer: Self-pay

## 2014-04-13 NOTE — Telephone Encounter (Signed)
Denied rx sent back to pharmacy

## 2014-04-13 NOTE — Telephone Encounter (Signed)
[facility]manda at [facility] said tramadol was filled on 03/16/14 and pt request refill.Please advise.

## 2014-04-14 MED ORDER — TRAMADOL HCL 50 MG PO TABS: 50.0000 mg | ORAL_TABLET | Freq: Two times a day (BID) | ORAL | Status: DC | PRN

## 2014-04-14 NOTE — Telephone Encounter (Signed)
Okay #120 x 0 

## 2014-04-14 NOTE — Telephone Encounter (Signed)
rx called into pharmacy

## 2014-05-16 ENCOUNTER — Other Ambulatory Visit: Payer: Self-pay | Admitting: *Deleted

## 2014-05-16 MED ORDER — TRAMADOL HCL 50 MG PO TABS: 50.0000 mg | ORAL_TABLET | Freq: Two times a day (BID) | ORAL | Status: DC | PRN

## 2014-05-16 NOTE — Telephone Encounter (Signed)
Okay #120 x 0 

## 2014-05-16 NOTE — Telephone Encounter (Signed)
Last OV in Jan 2015, last filled 04/14/14.

## 2014-05-16 NOTE — Telephone Encounter (Signed)
rx called into pharmacy

## 2014-05-27 ENCOUNTER — Encounter: Payer: Medicare Other | Admitting: Internal Medicine

## 2014-06-10 ENCOUNTER — Ambulatory Visit (INDEPENDENT_AMBULATORY_CARE_PROVIDER_SITE_OTHER): Payer: Medicare Other | Admitting: Internal Medicine

## 2014-06-10 ENCOUNTER — Encounter: Payer: Self-pay | Admitting: Internal Medicine

## 2014-06-10 ENCOUNTER — Encounter: Payer: Self-pay | Admitting: *Deleted

## 2014-06-10 VITALS — BP 148/80 | HR 60 | Temp 98.1°F | Ht 64.0 in | Wt 247.0 lb

## 2014-06-10 DIAGNOSIS — M150 Primary generalized (osteo)arthritis: Secondary | ICD-10-CM

## 2014-06-10 DIAGNOSIS — Z6841 Body Mass Index (BMI) 40.0 and over, adult: Secondary | ICD-10-CM

## 2014-06-10 DIAGNOSIS — M8949 Other hypertrophic osteoarthropathy, multiple sites: Secondary | ICD-10-CM

## 2014-06-10 DIAGNOSIS — I872 Venous insufficiency (chronic) (peripheral): Secondary | ICD-10-CM

## 2014-06-10 DIAGNOSIS — F39 Unspecified mood [affective] disorder: Secondary | ICD-10-CM | POA: Insufficient documentation

## 2014-06-10 DIAGNOSIS — M159 Polyosteoarthritis, unspecified: Secondary | ICD-10-CM

## 2014-06-10 DIAGNOSIS — F430 Acute stress reaction: Secondary | ICD-10-CM

## 2014-06-10 DIAGNOSIS — Z0000 Encounter for general adult medical examination without abnormal findings: Secondary | ICD-10-CM

## 2014-06-10 DIAGNOSIS — N3941 Urge incontinence: Secondary | ICD-10-CM

## 2014-06-10 DIAGNOSIS — Z23 Encounter for immunization: Secondary | ICD-10-CM

## 2014-06-10 DIAGNOSIS — I10 Essential (primary) hypertension: Secondary | ICD-10-CM

## 2014-06-10 LAB — TSH: TSH: 0.65 u[IU]/mL (ref 0.35–4.50)

## 2014-06-10 LAB — CBC WITH DIFFERENTIAL/PLATELET
Basophils Absolute: 0 10*3/uL (ref 0.0–0.1)
Basophils Relative: 0.7 % (ref 0.0–3.0)
Eosinophils Absolute: 0.1 10*3/uL (ref 0.0–0.7)
Eosinophils Relative: 1.6 % (ref 0.0–5.0)
HCT: 42 % (ref 36.0–46.0)
Hemoglobin: 14.1 g/dL (ref 12.0–15.0)
Lymphocytes Relative: 23.6 % (ref 12.0–46.0)
Lymphs Abs: 1.3 10*3/uL (ref 0.7–4.0)
MCHC: 33.5 g/dL (ref 30.0–36.0)
MCV: 85.7 fl (ref 78.0–100.0)
Monocytes Absolute: 0.4 10*3/uL (ref 0.1–1.0)
Monocytes Relative: 8.2 % (ref 3.0–12.0)
Neutro Abs: 3.5 10*3/uL (ref 1.4–7.7)
Neutrophils Relative %: 65.9 % (ref 43.0–77.0)
Platelets: 143 10*3/uL — ABNORMAL LOW (ref 150.0–400.0)
RBC: 4.9 Mil/uL (ref 3.87–5.11)
RDW: 13.9 % (ref 11.5–15.5)
WBC: 5.3 10*3/uL (ref 4.0–10.5)

## 2014-06-10 LAB — COMPREHENSIVE METABOLIC PANEL
ALT: 17 U/L (ref 0–35)
AST: 23 U/L (ref 0–37)
Albumin: 4.5 g/dL (ref 3.5–5.2)
Alkaline Phosphatase: 76 U/L (ref 39–117)
BUN: 17 mg/dL (ref 6–23)
CO2: 29 mEq/L (ref 19–32)
Calcium: 9.8 mg/dL (ref 8.4–10.5)
Chloride: 104 mEq/L (ref 96–112)
Creatinine, Ser: 0.6 mg/dL (ref 0.4–1.2)
GFR: 99.05 mL/min (ref >60.00)
Glucose, Bld: 105 mg/dL — ABNORMAL HIGH (ref 70–99)
Potassium: 4.4 mEq/L (ref 3.5–5.1)
Sodium: 140 mEq/L (ref 135–145)
Total Bilirubin: 0.8 mg/dL (ref 0.2–1.2)
Total Protein: 7.7 g/dL (ref 6.0–8.3)

## 2014-06-10 LAB — T4, FREE: Free T4: 0.91 ng/dL (ref 0.60–1.60)

## 2014-06-10 MED ORDER — LORAZEPAM 0.5 MG PO TABS: 0.2500 mg | ORAL_TABLET | Freq: Two times a day (BID) | ORAL | Status: AC | PRN

## 2014-06-10 MED ORDER — AMLODIPINE BESYLATE 5 MG PO TABS: 5.0000 mg | ORAL_TABLET | Freq: Every day | ORAL | Status: DC

## 2014-06-10 MED ORDER — TRAMADOL HCL 50 MG PO TABS: 50.0000 mg | ORAL_TABLET | Freq: Two times a day (BID) | ORAL | Status: DC | PRN

## 2014-06-10 NOTE — Progress Notes (Signed)
Subjective:    Patient ID: Autumn Price, female    DOB: 23-Aug-1941, 73 y.o.   MRN: [id number]  HPI Here for physical, wellness visit and follow up Reviewed her form and advanced directives Did have D&C in February---was benign Only sees ortho otherwise No tobacco or alcohol Some exercise-- walks some No cognitive problems  Has been crying a lot lately Very tired, sleeping more Especially notable in the past couple of weeks---had always had brief spells in past but longer now [place] now living with her--this has been a help Has used some left over lorazepam--- calms her down Doesn't want counseling  Still with knee pain Definitely better with the injections Takes meloxicam daily and 2 tramadol in AM Tylenol at night  Ongoing leg swelling---especially notable after work Elevates and they get better--okay in AM No significant pain  No chest pain No SOB No dizziness or syncope  Current Outpatient Prescriptions on File Prior to Visit  Medication Sig Dispense Refill  . acetaminophen (TYLENOL) 500 MG tablet Take 500 mg by mouth every 6 (six) hours as needed.        . meloxicam (MOBIC) 15 MG tablet Take 1 tablet (15 mg total) by mouth daily. As needed for arthritis pain  30 tablet  11  . Multiple Vitamins-Minerals (CENTRUM SILVER ULTRA WOMENS) TABS Take 1 tablet by mouth daily.      . traMADol (ULTRAM) 50 MG tablet Take 1-2 tablets (50-100 mg total) by mouth 2 (two) times daily as needed.  120 tablet  0   No current facility-administered medications on file prior to visit.    No Known Allergies  Past Medical History  Diagnosis Date  . Hypertension   . Osteoarthritis   . Venous insufficiency   . Urge incontinence     Past Surgical History  Procedure Laterality Date  . Appendectomy  1966  . Tubal ligation  1966  . Dilation and curettage of uterus  01/27/14    benign    Family History  Problem Relation Age of Onset  . Heart disease Father     died age 71-  MI  . Alcohol abuse Brother   . Heart disease Brother   . Heart disease Sister     History   Social History  . Marital Status: Divorced    Spouse Name: N/A    Number of Children: 3  . 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 Smoking status: Never Smoker   . Smokeless tobacco: Never Used  . Alcohol Use: No  . Drug Use: No  . Sexual Activity: Not on file   Other Topics Concern  . Not on file   Social History Narrative   Has living will   Designated son [name] to make health care decisions for her   Would accept CPR/resuscitation   Hasn't considered tube feedings--would leave to son   Review of Systems  Constitutional: Negative for fatigue.       Wears seat belt Weight down ~8#  HENT: Negative for dental problem, hearing loss and tinnitus.        Regular with dentist  Eyes: Negative for visual disturbance.       No diplopia or unilateral vision loss  Respiratory: Negative for cough, chest tightness and shortness of breath.   Cardiovascular: Positive for leg swelling. Negative for chest pain and palpitations.  Gastrointestinal: Negative for nausea, vomiting, abdominal pain, constipation and blood in stool.       No heartburn  Endocrine: Positive for heat intolerance. Negative for cold intolerance.  Genitourinary: Negative for dysuria and hematuria.       Still with urge incontinence--mostly just in the morning  Musculoskeletal: Positive for arthralgias and back pain. Negative for joint swelling.       Mild back pain at work  Skin: Negative for rash.       No suspicious lesions  Allergic/Immunologic: Positive for environmental allergies. Negative for immunocompromised state.       Mild sneezing and itchy eyes. No meds  Neurological: Positive for headaches. Negative for dizziness, syncope, weakness, light-headedness and numbness.       Rare headaches  Hematological: Negative for  adenopathy. Bruises/bleeds easily.  Psychiatric/Behavioral: Positive for dysphoric mood. Negative for sleep disturbance. The patient is nervous/anxious.        Objective:   Physical Exam  Constitutional: She is oriented to person, place, and time. She appears well-developed and well-nourished. No distress.  HENT:  Head: Normocephalic and atraumatic.  Right Ear: External ear normal.  Left Ear: External ear normal.  Mouth/Throat: Oropharynx is clear and moist. No oropharyngeal exudate.  Eyes: Conjunctivae and EOM are normal. Pupils are equal, round, and reactive to light.  Neck: Normal range of motion. Neck supple. No thyromegaly present.  Cardiovascular: Normal rate, regular rhythm, normal heart sounds and intact distal pulses.  Exam reveals no gallop.   No murmur heard. Pulmonary/Chest: Effort normal and breath sounds normal. No respiratory distress. She has no wheezes. She has no rales.  Abdominal: Soft. There is no tenderness.  Genitourinary:  Bilateral cystic changes in breasts  Musculoskeletal: She exhibits no tenderness.  3+ non pitting edema to knees Nodules on hand PIP and DIPs  Lymphadenopathy:    She has no cervical adenopathy.    She has no axillary adenopathy.  Neurological: She is alert and oriented to person, place, and time.  President-- "Obama, Bush, Clinton" [phone] D-[id number] Recall 2/3  Skin: No rash noted. No erythema.  Psychiatric:  Tearful briefly but not generally depressed Appropriate speech and appearance Affect is appropriate          Assessment & Plan:

## 2014-06-10 NOTE — Patient Instructions (Signed)
Please set up your screening mammogram.   DASH Eating Plan DASH stands for "Dietary Approaches to Stop Hypertension." The DASH eating plan is a healthy eating plan that has been shown to reduce high blood pressure (hypertension). Additional health benefits may include reducing the risk of type 2 diabetes mellitus, heart disease, and stroke. The DASH eating plan may also help with weight loss. WHAT DO I NEED TO KNOW ABOUT THE DASH EATING PLAN? For the DASH eating plan, you will follow these general guidelines:  Choose foods with a percent daily value for sodium of less than 5% (as listed on the food label).  Use salt-free seasonings or herbs instead of table salt or sea salt.  Check with your health care provider or pharmacist before using salt substitutes.  Eat lower-sodium products, often labeled as "lower sodium" or "no salt added."  Eat fresh foods.  Eat more vegetables, fruits, and low-fat dairy products.  Choose whole grains. Look for the word "whole" as the first word in the ingredient list.  Choose fish and skinless chicken or turkey more often than red meat. Limit fish, poultry, and meat to 6 oz (170 g) each day.  Limit sweets, desserts, sugars, and sugary drinks.  Choose heart-healthy fats.  Limit cheese to 1 oz (28 g) per day.  Eat more home-cooked food and less restaurant, buffet, and fast food.  Limit fried foods.  Cook foods using methods other than frying.  Limit canned vegetables. If you do use them, rinse them well to decrease the sodium.  When eating at a restaurant, ask that your food be prepared with less salt, or no salt if possible. WHAT FOODS CAN I EAT? Seek help from a dietitian for individual calorie needs. Grains Whole grain or whole wheat bread. Brown rice. Whole grain or whole wheat pasta. Quinoa, bulgur, and whole grain cereals. Low-sodium cereals. Corn or whole wheat flour tortillas. Whole grain cornbread. Whole grain crackers. Low-sodium  crackers. Vegetables Fresh or frozen vegetables (raw, steamed, roasted, or grilled). Low-sodium or reduced-sodium tomato and vegetable juices. Low-sodium or reduced-sodium tomato sauce and paste. Low-sodium or reduced-sodium canned vegetables.  Fruits All fresh, canned (in natural juice), or frozen fruits. Meat and Other Protein Products Ground beef (85% or leaner), grass-fed beef, or beef trimmed of fat. Skinless chicken or turkey. Ground chicken or turkey. Pork trimmed of fat. All fish and seafood. Eggs. Dried beans, peas, or lentils. Unsalted nuts and seeds. Unsalted canned beans. Dairy Low-fat dairy products, such as skim or 1% milk, 2% or reduced-fat cheeses, low-fat ricotta or cottage cheese, or plain low-fat yogurt. Low-sodium or reduced-sodium cheeses. Fats and Oils Tub margarines without trans fats. Light or reduced-fat mayonnaise and salad dressings (reduced sodium). Avocado. Safflower, olive, or canola oils. Natural peanut or almond butter. Other Unsalted popcorn and pretzels. The items listed above may not be a complete list of recommended foods or beverages. Contact your dietitian for more options. WHAT FOODS ARE NOT RECOMMENDED? Grains White bread. White pasta. White rice. Refined cornbread. Bagels and croissants. Crackers that contain trans fat. Vegetables Creamed or fried vegetables. Vegetables in a cheese sauce. Regular canned vegetables. Regular canned tomato sauce and paste. Regular tomato and vegetable juices. Fruits Dried fruits. Canned fruit in light or heavy syrup. Fruit juice. Meat and Other Protein Products Fatty cuts of meat. Ribs, chicken wings, bacon, sausage, bologna, salami, chitterlings, fatback, hot dogs, bratwurst, and packaged luncheon meats. Salted nuts and seeds. Canned beans with salt. Dairy Whole or 2% milk, cream, half-and-half,   and cream cheese. Whole-fat or sweetened yogurt. Full-fat cheeses or blue cheese. Nondairy creamers and whipped toppings.  Processed cheese, cheese spreads, or cheese curds. Condiments Onion and garlic salt, seasoned salt, table salt, and sea salt. Canned and packaged gravies. Worcestershire sauce. Tartar sauce. Barbecue sauce. Teriyaki sauce. Soy sauce, including reduced sodium. Steak sauce. Fish sauce. Oyster sauce. Cocktail sauce. Horseradish. Ketchup and mustard. Meat flavorings and tenderizers. Bouillon cubes. Hot sauce. Tabasco sauce. Marinades. Taco seasonings. Relishes. Fats and Oils Butter, stick margarine, lard, shortening, ghee, and bacon fat. Coconut, palm kernel, or palm oils. Regular salad dressings. Other Pickles and olives. Salted popcorn and pretzels. The items listed above may not be a complete list of foods and beverages to avoid. Contact your dietitian for more information. WHERE CAN I FIND MORE INFORMATION? National Heart, Lung, and Blood Institute: www.nhlbi.nih.gov/health/health-topics/topics/dash/ Document Released: 11/21/2011 Document Revised: 12/07/2013 Document Reviewed: 10/06/2013 ExitCare Patient Information 2015 [employer]. This information is not intended to replace advice given to you by your health care provider. Make sure you discuss any questions you have with your health care provider.  

## 2014-06-10 NOTE — Assessment & Plan Note (Signed)
I have personally reviewed the Medicare Annual Wellness questionnaire and have noted 1. The patient's medical and social history 2. Their use of alcohol, tobacco or illicit drugs 3. Their current medications and supplements 4. The patient's functional ability including ADL's, fall risks, home safety risks and hearing or visual             impairment. 5. Diet and physical activities 6. Evidence for depression or mood disorders  The patients weight, height, BMI and visual acuity have been recorded in the chart I have made referrals, counseling and provided education to the patient based review of the above and I have provided the pt with a written personalized care plan for preventive services.  I have provided you with a copy of your personalized plan for preventive services. Please take the time to review along with your updated medication list.  Due for fecal immunoassay Needs to set up mammogram--then every 2 years Recent Pap prevnar today Yearly flu shot

## 2014-06-10 NOTE — Assessment & Plan Note (Signed)
Just in AM Discussed Kegels

## 2014-06-10 NOTE — Assessment & Plan Note (Signed)
Has lost some weight Info given again

## 2014-06-10 NOTE — Assessment & Plan Note (Signed)
BP Readings from Last 3 Encounters:  06/10/14 148/80  12/22/13 160/60  05/21/13 130/70   Reasonable for age Due for labs

## 2014-06-10 NOTE — Assessment & Plan Note (Signed)
Gets periods of crying, fatigue, etc---now lasting more than usual Not MDD Doesn't want counseling Will just recheck in a couple of months

## 2014-06-10 NOTE — Assessment & Plan Note (Signed)
Hands and knees Doing okay on current meds

## 2014-06-10 NOTE — Addendum Note (Signed)
Addended by: Sueanne MargaritaSMITH, Alize Acy L on: 06/10/2014 12:09 PM   Modules accepted: Orders

## 2014-06-10 NOTE — Addendum Note (Signed)
Addended by: Sueanne MargaritaSMITH, Cross Jorge L on: 06/10/2014 03:07 PM   Modules accepted: Orders

## 2014-06-10 NOTE — Progress Notes (Signed)
Pre visit review using our clinic review tool, if applicable. No additional management support is needed unless otherwise documented below in the visit note. 

## 2014-06-10 NOTE — Assessment & Plan Note (Signed)
Discussed support hose when at work

## 2014-06-13 ENCOUNTER — Telehealth: Payer: Self-pay | Admitting: Internal Medicine

## 2014-06-13 NOTE — Telephone Encounter (Signed)
Relevant patient education mailed to patient.  

## 2014-06-27 ENCOUNTER — Encounter: Payer: Self-pay | Admitting: Internal Medicine

## 2014-07-08 ENCOUNTER — Other Ambulatory Visit: Payer: Self-pay | Admitting: *Deleted

## 2014-07-08 MED ORDER — TRAMADOL HCL 50 MG PO TABS: 50.0000 mg | ORAL_TABLET | Freq: Two times a day (BID) | ORAL | Status: DC | PRN

## 2014-07-08 NOTE — Telephone Encounter (Signed)
06/10/14 

## 2014-07-08 NOTE — Telephone Encounter (Signed)
rx called into pharmacy

## 2014-07-08 NOTE — Telephone Encounter (Signed)
Okay #120 x 0 

## 2014-08-01 ENCOUNTER — Ambulatory Visit: Payer: Self-pay | Admitting: Internal Medicine

## 2014-08-02 ENCOUNTER — Encounter: Payer: Self-pay | Admitting: Internal Medicine

## 2014-08-05 ENCOUNTER — Encounter: Payer: Self-pay | Admitting: *Deleted

## 2014-08-12 ENCOUNTER — Ambulatory Visit (INDEPENDENT_AMBULATORY_CARE_PROVIDER_SITE_OTHER): Payer: Medicare Other | Admitting: Internal Medicine

## 2014-08-12 ENCOUNTER — Encounter: Payer: Self-pay | Admitting: Internal Medicine

## 2014-08-12 VITALS — BP 140/70 | HR 78 | Temp 98.1°F | Wt 245.0 lb

## 2014-08-12 DIAGNOSIS — F39 Unspecified mood [affective] disorder: Secondary | ICD-10-CM

## 2014-08-12 MED ORDER — TRAMADOL HCL 50 MG PO TABS: 50.0000 mg | ORAL_TABLET | Freq: Two times a day (BID) | ORAL | Status: DC | PRN

## 2014-08-12 MED ORDER — MELOXICAM 15 MG PO TABS: 15.0000 mg | ORAL_TABLET | Freq: Every day | ORAL | Status: DC

## 2014-08-12 NOTE — Progress Notes (Signed)
Pre visit review using our clinic review tool, if applicable. No additional management support is needed unless otherwise documented below in the visit note. 

## 2014-08-12 NOTE — Assessment & Plan Note (Signed)
Clearly still some dysthymia but actually some better Discussed exercise, social engagement, etc to improve mood Rx not needed (except prn lorazepam)

## 2014-08-12 NOTE — Progress Notes (Signed)
   Subjective:    Patient ID: Autumn Price, female    DOB: [birth year], 73 y.o.   MRN: [id number]  HPI She fee ls she is better Still has some spells--bad days May have periods of crying No clear thing that upsets you  Nephew still with her---this is a good thing Sees son and wife for lunch at times Has only occasional down days--- ?once a week Will at times take lorazepam ---this calms her when she is upset  Sleeps okay Appetite is okay No problems maintaining instrumental ADLs Still works full time--not much else social involvement  Feels "tired" Gets home from work and no energy Does do the bike at times   Ongoing knee pain Due for recheck--may need another round of shots (supartz?) Had been able to walk after this  Current Outpatient Prescriptions on File Prior to Visit  Medication Sig Dispense Refill  . acetaminophen (TYLENOL) 500 MG tablet Take 500 mg by mouth every 6 (six) hours as needed.        Marland Kitchen amLODipine (NORVASC) 5 MG tablet Take 1 tablet (5 mg total) by mouth daily.  30 tablet  11  . meloxicam (MOBIC) 15 MG tablet Take 1 tablet (15 mg total) by mouth daily. As needed for arthritis pain  30 tablet  11  . Multiple Vitamins-Minerals (CENTRUM SILVER ULTRA WOMENS) TABS Take 1 tablet by mouth daily.      . traMADol (ULTRAM) 50 MG tablet Take 1-2 tablets (50-100 mg total) by mouth 2 (two) times daily as needed.  120 tablet  0   No current facility-administered medications on file prior to visit.    No Known Allergies  Past Medical History  Diagnosis Date  . Hypertension   . Osteoarthritis   . Venous insufficiency   . Urge incontinence     Past Surgical History  Procedure Laterality Date  . Appendectomy  1966  . Tubal ligation  1966  . Dilation and curettage of uterus  01/27/14    benign    Family History  Problem Relation Age of Onset  . Heart disease Father     died age 67- MI  . Alcohol abuse Brother   . Heart disease Brother   . Heart disease  Sister     History   Social History  . Marital Status: Divorced    Spouse Name: N/A    Number of Children: 3  . 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 Smoking status: Never Smoker   . Smokeless tobacco: Never Used  . Alcohol Use: No  . Drug Use: No  . Sexual Activity: Not on file   Other Topics Concern  . Not on file   Social History Narrative   Has living will   Designated son Autumn Price to make health care decisions for her   Would accept CPR/resuscitation   Hasn't considered tube feedings--would leave to son   Review of Systems Has started sewing again--cross stitch, etc. She enjoys this hobby No other arthritic areas    Objective:   Physical Exam  Constitutional: She appears well-developed. No distress.  Psychiatric:  Normal appearance and speech Not depressed Appropriate affect          Assessment & Plan:

## 2014-09-13 ENCOUNTER — Other Ambulatory Visit: Payer: Self-pay | Admitting: Internal Medicine

## 2014-09-14 NOTE — Telephone Encounter (Signed)
#  120x0 last filled 08/12/14, last appt was 08/12/14, and next is scheduled for 08/11/15.

## 2014-09-14 NOTE — Telephone Encounter (Signed)
Okay #120 x 0 

## 2014-09-14 NOTE — Telephone Encounter (Signed)
[facility].

## 2014-10-11 ENCOUNTER — Other Ambulatory Visit: Payer: Self-pay | Admitting: Internal Medicine

## 2014-10-11 NOTE — Telephone Encounter (Signed)
09/14/14 

## 2014-10-12 NOTE — Telephone Encounter (Signed)
Okay #120 x 0 

## 2014-10-12 NOTE — Telephone Encounter (Signed)
rx called into pharmacy

## 2014-11-14 ENCOUNTER — Other Ambulatory Visit: Payer: Self-pay | Admitting: Internal Medicine

## 2014-11-14 NOTE — Telephone Encounter (Signed)
10/12/14 

## 2014-11-14 NOTE — Telephone Encounter (Signed)
Approved: #120 x 0 

## 2014-11-14 NOTE — Telephone Encounter (Signed)
Medication phoned to pharmacy.  

## 2014-12-12 ENCOUNTER — Other Ambulatory Visit: Payer: Self-pay | Admitting: Internal Medicine

## 2014-12-12 NOTE — Telephone Encounter (Signed)
11/14/14 

## 2014-12-13 ENCOUNTER — Encounter: Payer: Self-pay | Admitting: Internal Medicine

## 2014-12-13 ENCOUNTER — Ambulatory Visit (INDEPENDENT_AMBULATORY_CARE_PROVIDER_SITE_OTHER): Payer: Medicare Other | Admitting: Internal Medicine

## 2014-12-13 VITALS — BP 160/80 | HR 94 | Temp 97.9°F | Wt 239.0 lb

## 2014-12-13 DIAGNOSIS — N61 Inflammatory disorders of breast: Secondary | ICD-10-CM

## 2014-12-13 MED ORDER — CLINDAMYCIN HCL 300 MG PO CAPS: 300.0000 mg | ORAL_CAPSULE | Freq: Three times a day (TID) | ORAL | Status: DC

## 2014-12-13 NOTE — Assessment & Plan Note (Signed)
Fairly classic presentation for MRSA Discussed increased risk for recurrence Discussed warm packing clinda for 1 week---will change if not effective

## 2014-12-13 NOTE — Telephone Encounter (Signed)
rx called into pharmacy

## 2014-12-13 NOTE — Progress Notes (Signed)
   Subjective:    Patient ID: Autumn Price, female    DOB: 02/13/1941, 73 y.o.   MRN: [id number]  HPI Here due to "boil" on left breast  First noticed it a week ago Felt like a scab at first Draining some Not tender but feels it with movement--like stretching Very red  No fever Doesn't feel sick  Current Outpatient Prescriptions on File Prior to Visit  Medication Sig Dispense Refill  . acetaminophen (TYLENOL) 500 MG tablet Take 500 mg by mouth every 6 (six) hours as needed.      Marland Kitchen. amLODipine (NORVASC) 5 MG tablet Take 1 tablet (5 mg total) by mouth daily. 30 tablet 11  . LORazepam (ATIVAN) 0.5 MG tablet Take 0.5 mg by mouth as needed.     . meloxicam (MOBIC) 15 MG tablet Take 1 tablet (15 mg total) by mouth daily. As needed for arthritis pain 30 tablet 11  . Multiple Vitamins-Minerals (CENTRUM SILVER ULTRA WOMENS) TABS Take 1 tablet by mouth daily.    . traMADol (ULTRAM) 50 MG tablet Take 1-2 tablets (50-100 mg total) by mouth 2 (two) times daily as needed. 120 tablet 0   No current facility-administered medications on file prior to visit.    No Known Allergies  Past Medical History  Diagnosis Date  . Hypertension   . Osteoarthritis   . Venous insufficiency   . Urge incontinence     Past Surgical History  Procedure Laterality Date  . Appendectomy  1966  . Tubal ligation  1966  . Dilation and curettage of uterus  01/27/14    benign    Family History  Problem Relation Age of Onset  . Heart disease Father     died age 71- MI  . Alcohol abuse Brother   . Heart disease Brother   . Heart disease Sister     History   Social History  . Marital Status: Divorced    Spouse Name: N/A    Number of Children: 3  . 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 Smoking status: Never Smoker   . Smokeless tobacco: Never Used  . Alcohol Use: No  . Drug Use: No  . Sexual Activity: Not on  file   Other Topics Concern  . Not on file   Social History Narrative   Has living will   Designated son Autumn Price to make health care decisions for her   Would accept CPR/resuscitation   Hasn't considered tube feedings--would leave to son   Review of Systems  No nipple discharge No axillary nodes     Objective:   Physical Exam  Skin:  Ulcerated spot with black eschar and surrounding redness on underside of left breast. 2 red papules below the breast  No underlying mass in the breast          Assessment & Plan:

## 2014-12-13 NOTE — Telephone Encounter (Signed)
Approved: #120 x 0 

## 2014-12-13 NOTE — Progress Notes (Signed)
Pre visit review using our clinic review tool, if applicable. No additional management support is needed unless otherwise documented below in the visit note. 

## 2014-12-13 NOTE — Patient Instructions (Signed)
Community-Associated MRSA °CA-MRSA stands for community-associated methicillin-resistant Staphylococcus aureus. MRSA is a type of bacteria that is resistant to some common antibiotics. It can cause infections in the skin and many other places in the body. Staphylococcus aureus, often called "staph," is a bacteria that normally lives on the skin or in the nose. Staph on the surface of the skin or in the nose does not cause problems. However, if the staph enters the body through a cut, wound, or break in the skin, an infection can happen. °Up until recently, infections with the MRSA type of staph mainly occurred in hospitals and other health care settings. There are now increasing problems with MRSA infections in the community as well. Infections with MRSA may be very serious or even life threatening. °CA-MRSA is becoming more common. It is known to spread in crowded settings, in jails and prisons, and in situations where there is close skin-to-skin contact, such as during sporting events or in locker rooms. MRSA can be spread through shared items, such as children's toys, razors, towels, or sports equipment.  °CAUSES °All staph, including MRSA, are normally harmless unless they enter the body through a scratch, cut, or wound, such as with surgery. All staph, including MRSA, can be spread from person-to-person by touching contaminated objects or through direct contact. °· MRSA now causes illness in people who have not been in hospitals or other health care facilities. Cases of MRSA diseases in the community have been associated with: °¨ Recent antibiotic use. °¨ Sharing contaminated towels or clothes. °¨ Having active skin diseases. °¨ Participating in contact sports. °¨ Living in crowded settings. °¨ Intravenous (IV) drug use. °· Community-associated MRSA infections are usually skin infections, but may cause other severe illnesses. °· Staph bacteria are one of the most common causes of skin infection. However, they  are also a common cause of pneumonia, bone or joint infections, and bloodstream infections. °DIAGNOSIS °Diagnosis of MRSA is done by cultures of fluid samples that may come from: °· Swabs taken from cuts or wounds in infected areas. °· Nasal swabs. °· Saliva or deep cough specimens from the lungs (sputum). °· Urine. °· Blood. °Many people are "colonized" with MRSA but have no signs of infection. This means that people carry the MRSA germ on their skin or in their nose and may never develop MRSA infection.  °TREATMENT  °Treatment varies and is based on how serious, how deep, or how extensive the infection is. For example: °· Some skin infections, such as a small boil or abscess, may be treated by draining yellowish-white fluid (pus) from the site of the infection. °· Deeper or more widespread soft tissue infections are usually treated with surgery to drain pus and with antibiotic medicine given by vein or by mouth. This may be recommended even if you are pregnant. °· Serious infections may require a hospital stay. °If antibiotics are given, they may be needed for several weeks. °PREVENTION °Because many people are colonized with staph, including MRSA, preventing the spread of the bacteria from person-to-person is most important. The best way to prevent the spread of bacteria and other germs is through proper hand washing or by using alcohol-based hand disinfectants. The following are other ways to help prevent MRSA infection within community settings.  °· Wash your hands frequently with soap and water for at least 15 seconds. Otherwise, use alcohol-based hand disinfectants when soap and water is not available. °· Make sure people who live with you wash their hands often, too. °·   Do not share personal items. For example, avoid sharing razors and other personal hygiene items, towels, clothing, and athletic equipment. °· Wash and dry your clothes and bedding at the warmest temperatures recommended on the labels. °· Keep  wounds covered. Pus from infected sores may contain MRSA and other bacteria. Keep cuts and abrasions clean and covered with germ-free (sterile), dry bandages until they are healed. °· If you have a wound that appears infected, ask your caregiver if a culture for MRSA and other bacteria should be done. °· If you are breastfeeding, talk to your caregiver about MRSA. You may be asked to temporarily stop breastfeeding. °HOME CARE INSTRUCTIONS  °· Take your antibiotics as directed. Finish them even if you start to feel better. °· Avoid close contact with those around you as much as possible. Do not use towels, razors, toothbrushes, bedding, or other items that will be used by others. °· To fight the infection, follow your caregiver's instructions for wound care. Wash your hands before and after changing your bandages. °· If you have an intravascular device, such as a catheter, make sure you know how to care for it. °· Be sure to tell any health care providers that you have MRSA so they are aware of your infection. °SEEK IMMEDIATE MEDICAL CARE IF: °· The infection appears to be getting worse. Signs include: °¨ Increased warmth, redness, or tenderness around the wound site. °¨ A red line that extends from the infection site. °¨ A dark color in the area around the infection. °¨ Wound drainage that is tan, yellow, or green. °¨ A bad smell coming from the wound. °· You feel sick to your stomach (nauseous) and throw up (vomit) or cannot keep medicine down. °· You have a fever. °· Your baby is older than 3 months with a rectal temperature of 102°F (38.9°C) or higher. °· Your baby is 3 months old or younger with a rectal temperature of 100.4°F (38°C) or higher. °· You have difficulty breathing. °MAKE SURE YOU:  °· Understand these instructions. °· Will watch your condition. °· Will get help right away if you are not doing well or get worse. °Document Released: 03/07/2006 Document Revised: 04/18/2014 Document Reviewed:  03/07/2011 °ExitCare® Patient Information ©2015 [employer]. This information is not intended to replace advice given to you by your health care provider. Make sure you discuss any questions you have with your health care provider. ° °

## 2015-01-12 ENCOUNTER — Other Ambulatory Visit: Payer: Self-pay | Admitting: *Deleted

## 2015-01-12 NOTE — Telephone Encounter (Signed)
Faxed refill request. Last Filled:    120 tablet 0 RF on 12/13/2014  Please advise.

## 2015-01-13 MED ORDER — TRAMADOL HCL 50 MG PO TABS: 50.0000 mg | ORAL_TABLET | Freq: Two times a day (BID) | ORAL | Status: DC | PRN

## 2015-01-13 NOTE — Telephone Encounter (Signed)
Rx called in to requested pharmacy 

## 2015-01-13 NOTE — Telephone Encounter (Signed)
Approved: #120 x 0 

## 2015-01-13 NOTE — Telephone Encounter (Signed)
Pharmacy line busy.

## 2015-01-18 ENCOUNTER — Encounter: Payer: Self-pay | Admitting: Internal Medicine

## 2015-02-13 ENCOUNTER — Other Ambulatory Visit: Payer: Self-pay

## 2015-02-13 NOTE — Telephone Encounter (Signed)
Refill request from [facility] for Tramadol 50mg .  Patient last seen 12/13/2014.  Last filled 01/13/2015.  Please advise.

## 2015-02-14 MED ORDER — TRAMADOL HCL 50 MG PO TABS: 50.0000 mg | ORAL_TABLET | Freq: Two times a day (BID) | ORAL | Status: DC | PRN

## 2015-02-14 NOTE — Telephone Encounter (Signed)
Approved: #120 x 0 

## 2015-02-14 NOTE — Telephone Encounter (Signed)
Tramadol called into [facility] per Dr Alphonsus SiasLetvak approval.

## 2015-03-06 ENCOUNTER — Telehealth: Payer: Self-pay

## 2015-03-06 NOTE — Telephone Encounter (Signed)
Left message for pt to call back if she still wants flu vaccine 

## 2015-03-13 ENCOUNTER — Other Ambulatory Visit: Payer: Self-pay | Admitting: *Deleted

## 2015-03-13 NOTE — Telephone Encounter (Signed)
Received faxed refill request from pharmacy. Last refill 02/14/15 #120, last office visit 12/296/15. Is it okay to refill medication?

## 2015-03-14 MED ORDER — TRAMADOL HCL 50 MG PO TABS: 50.0000 mg | ORAL_TABLET | Freq: Two times a day (BID) | ORAL | Status: DC | PRN

## 2015-03-14 NOTE — Telephone Encounter (Signed)
rx called into pharmacy

## 2015-03-14 NOTE — Telephone Encounter (Signed)
Approved: #120 x 0 

## 2015-04-08 NOTE — Op Note (Signed)
PATIENT NAME:  Autumn Price, Yamilette C MR#:  [id number] DATE OF BIRTH:  05/15/1941  DATE OF PROCEDURE:  01/27/2014  PREOPERATIVE DIAGNOSIS: Postmenopausal bleeding.  POSTOPERATIVE DIAGNOSES: 1.  Postmenopausal bleeding.  2.  Cervical stenosis.  PROCEDURES: 1.  Ultrasound-guided dilation and curettage.  2.  Hysteroscopy.   ANESTHESIA:  General.  SURGEON: Thomasene MohairStephen Shayne Diguglielmo, MD   ESTIMATED BLOOD LOSS: 25 mL   OPERATIVE FLUIDS:  600 mL crystalloid.   COMPLICATIONS: None.   FINDINGS: 1.  Very stenotic cervical canal.  2.  Polypoid fundal lesions.  3.  Otherwise normal-appearing uterine cavity.   SPECIMENS: Endometrial curettings.  CONDITION AT END OF PROCEDURE:  Stable.   PROCEDURE IN DETAIL: The patient was taken to the operating room where general anesthesia was administered and found to be adequate. She was placed in the dorsal supine high lithotomy position in candy cane stirrups and prepped and draped in the usual sterile fashion. After timeout was called, a catheter was introduced in her bladder and her bladder was drained for decompression. A sterile speculum was placed in the vagina and a single-tooth tenaculum was used to grasp the anterior lip of the cervix. Initial attempts were made to dilate the cervix; however, the internal cervical os could not be located nor dilated. Multiple attempts were made with the smallest Hegar dilator. Lacrimal duct probes were utilized; however, even still no ability to pass the lacrimal duct probe was realized. Ultrasound technician then brought in an ultrasound machine where transabdominally the uterus was visualized and eventually a pathway was found through the endocervical canal into the endometrium. Gentle dilation initially using lacrimal duct probes and then graduating up to Hegar dilators up to dilatation of 8 mm. The hysteroscope was then gently introduced through the cervical canal into the uterus with the above-noted findings. A gentle curettage  was attempted and a single pass with Randall stone forceps was used to grasp any remaining polypoid tissue. Another pass with the curette was undertaken to try to obtain as much of the fundal polypoid tissue as possible for sampling.  General sample of the rest of the uterus was taken as well. This concluded the procedure. All instruments were removed from the uterus. The single-tooth tenaculum was removed from the cervix. Hemostasis was noted at the tenaculum sites. The speculum was removed from the vagina. No indwelling catheter was left. A straight catheterization was utilized.   The patient tolerated the procedure well. Sponge, lap and needle counts were correct x 2. The patient was wearing pneumatic compression stockings which were on and operating throughout the entire procedure for VTE prophylaxis. She was awakened in the operating room and taken to the recovery room in stable condition.   ____________________________ Conard NovakStephen D. Adilynne Fitzwater, MD sdj:ce D: 01/27/2014 17:03:57 ET T: 01/27/2014 20:00:02 ET JOB#: [id number]  cc: Conard NovakStephen D. Calah Gershman, MD, <Dictator> Conard NovakSTEPHEN D Bria Sparr MD ELECTRONICALLY SIGNED 02/13/2014 15:31

## 2015-04-12 ENCOUNTER — Other Ambulatory Visit: Payer: Self-pay | Admitting: *Deleted

## 2015-04-12 MED ORDER — TRAMADOL HCL 50 MG PO TABS: 50.0000 mg | ORAL_TABLET | Freq: Two times a day (BID) | ORAL | Status: DC | PRN

## 2015-04-12 NOTE — Telephone Encounter (Signed)
rx called into pharmacy

## 2015-04-12 NOTE — Telephone Encounter (Signed)
03/14/2015 

## 2015-04-12 NOTE — Telephone Encounter (Signed)
Approved: #120 x 0 

## 2015-05-16 ENCOUNTER — Other Ambulatory Visit: Payer: Self-pay

## 2015-05-16 MED ORDER — TRAMADOL HCL 50 MG PO TABS: 50.0000 mg | ORAL_TABLET | Freq: Two times a day (BID) | ORAL | Status: DC | PRN

## 2015-05-16 NOTE — Telephone Encounter (Signed)
Pt left v/m checking on status of tramadol refill requested by pt on 05/10/15; last annual exam 06/10/14 and rx last refilled # 120 on 04/12/15. Pt request cb.

## 2015-05-16 NOTE — Telephone Encounter (Signed)
Approved: #120 x 0 

## 2015-05-16 NOTE — Telephone Encounter (Signed)
rx called into pharmacy

## 2015-06-02 DIAGNOSIS — M1711 Unilateral primary osteoarthritis, right knee: Secondary | ICD-10-CM | POA: Diagnosis not present

## 2015-06-02 DIAGNOSIS — M170 Bilateral primary osteoarthritis of knee: Secondary | ICD-10-CM | POA: Diagnosis not present

## 2015-06-02 DIAGNOSIS — M1712 Unilateral primary osteoarthritis, left knee: Secondary | ICD-10-CM | POA: Diagnosis not present

## 2015-06-09 DIAGNOSIS — M1712 Unilateral primary osteoarthritis, left knee: Secondary | ICD-10-CM | POA: Diagnosis not present

## 2015-06-09 DIAGNOSIS — M1711 Unilateral primary osteoarthritis, right knee: Secondary | ICD-10-CM | POA: Diagnosis not present

## 2015-06-13 ENCOUNTER — Other Ambulatory Visit: Payer: Self-pay | Admitting: *Deleted

## 2015-06-13 MED ORDER — AMLODIPINE BESYLATE 5 MG PO TABS: 5.0000 mg | ORAL_TABLET | Freq: Every day | ORAL | Status: DC

## 2015-06-13 NOTE — Telephone Encounter (Signed)
05/16/2015 

## 2015-06-14 MED ORDER — TRAMADOL HCL 50 MG PO TABS: 50.0000 mg | ORAL_TABLET | Freq: Two times a day (BID) | ORAL | Status: DC | PRN

## 2015-06-14 NOTE — Telephone Encounter (Signed)
rx called into pharmacy

## 2015-06-14 NOTE — Telephone Encounter (Signed)
Approved: okay #120 x 0 

## 2015-06-16 DIAGNOSIS — M1712 Unilateral primary osteoarthritis, left knee: Secondary | ICD-10-CM | POA: Diagnosis not present

## 2015-06-16 DIAGNOSIS — M170 Bilateral primary osteoarthritis of knee: Secondary | ICD-10-CM | POA: Diagnosis not present

## 2015-06-16 DIAGNOSIS — M1711 Unilateral primary osteoarthritis, right knee: Secondary | ICD-10-CM | POA: Diagnosis not present

## 2015-07-13 ENCOUNTER — Other Ambulatory Visit: Payer: Self-pay | Admitting: *Deleted

## 2015-07-13 MED ORDER — TRAMADOL HCL 50 MG PO TABS: 50.0000 mg | ORAL_TABLET | Freq: Two times a day (BID) | ORAL | Status: DC | PRN

## 2015-07-13 NOTE — Telephone Encounter (Signed)
Last filled 06/14/2015 LETVAK PATIENT, Please send back to me for call in

## 2015-07-13 NOTE — Telephone Encounter (Signed)
Ok to phone in tramadol 

## 2015-07-13 NOTE — Telephone Encounter (Signed)
rx called into pharmacy

## 2015-08-04 ENCOUNTER — Encounter: Payer: Self-pay | Admitting: Internal Medicine

## 2015-08-04 ENCOUNTER — Ambulatory Visit (INDEPENDENT_AMBULATORY_CARE_PROVIDER_SITE_OTHER): Payer: Medicare Other | Admitting: Internal Medicine

## 2015-08-04 VITALS — BP 138/60 | HR 65 | Temp 97.7°F | Ht 64.0 in | Wt 242.0 lb

## 2015-08-04 DIAGNOSIS — F39 Unspecified mood [affective] disorder: Secondary | ICD-10-CM | POA: Diagnosis not present

## 2015-08-04 DIAGNOSIS — I10 Essential (primary) hypertension: Secondary | ICD-10-CM | POA: Diagnosis not present

## 2015-08-04 DIAGNOSIS — Z6841 Body Mass Index (BMI) 40.0 and over, adult: Secondary | ICD-10-CM

## 2015-08-04 DIAGNOSIS — I872 Venous insufficiency (chronic) (peripheral): Secondary | ICD-10-CM

## 2015-08-04 DIAGNOSIS — Z0000 Encounter for general adult medical examination without abnormal findings: Secondary | ICD-10-CM | POA: Diagnosis not present

## 2015-08-04 DIAGNOSIS — Z7189 Other specified counseling: Secondary | ICD-10-CM | POA: Insufficient documentation

## 2015-08-04 DIAGNOSIS — Z1211 Encounter for screening for malignant neoplasm of colon: Secondary | ICD-10-CM

## 2015-08-04 LAB — COMPREHENSIVE METABOLIC PANEL
ALT: 11 U/L (ref 0–35)
AST: 15 U/L (ref 0–37)
Albumin: 4.2 g/dL (ref 3.5–5.2)
Alkaline Phosphatase: 75 U/L (ref 39–117)
BUN: 21 mg/dL (ref 6–23)
CO2: 30 mEq/L (ref 19–32)
Calcium: 9.8 mg/dL (ref 8.4–10.5)
Chloride: 105 mEq/L (ref 96–112)
Creatinine, Ser: 0.71 mg/dL (ref 0.40–1.20)
GFR: 86.01 mL/min (ref >60.00)
Glucose, Bld: 93 mg/dL (ref 70–99)
Potassium: 4.4 mEq/L (ref 3.5–5.1)
Sodium: 141 mEq/L (ref 135–145)
Total Bilirubin: 0.7 mg/dL (ref 0.2–1.2)
Total Protein: 6.9 g/dL (ref 6.0–8.3)

## 2015-08-04 LAB — CBC WITH DIFFERENTIAL/PLATELET
Basophils Absolute: 0.1 10*3/uL (ref 0.0–0.1)
Basophils Relative: 1 % (ref 0.0–3.0)
Eosinophils Absolute: 0.2 10*3/uL (ref 0.0–0.7)
Eosinophils Relative: 3.1 % (ref 0.0–5.0)
HCT: 41.2 % (ref 36.0–46.0)
Hemoglobin: 13.8 g/dL (ref 12.0–15.0)
Lymphocytes Relative: 24.4 % (ref 12.0–46.0)
Lymphs Abs: 1.2 10*3/uL (ref 0.7–4.0)
MCHC: 33.5 g/dL (ref 30.0–36.0)
MCV: 86.9 fl (ref 78.0–100.0)
Monocytes Absolute: 0.4 10*3/uL (ref 0.1–1.0)
Monocytes Relative: 8.4 % (ref 3.0–12.0)
Neutro Abs: 3.2 10*3/uL (ref 1.4–7.7)
Neutrophils Relative %: 63.1 % (ref 43.0–77.0)
Platelets: 149 10*3/uL — ABNORMAL LOW (ref 150.0–400.0)
RBC: 4.73 Mil/uL (ref 3.87–5.11)
RDW: 15 % (ref 11.5–15.5)
WBC: 5 10*3/uL (ref 4.0–10.5)

## 2015-08-04 MED ORDER — ZOSTER VACCINE LIVE 19400 UNT/0.65ML ~~LOC~~ SOLR: 0.6500 mL | Freq: Once | SUBCUTANEOUS | Status: DC

## 2015-08-04 MED ORDER — MELOXICAM 15 MG PO TABS: 15.0000 mg | ORAL_TABLET | Freq: Every day | ORAL | Status: DC

## 2015-08-04 NOTE — Patient Instructions (Signed)
DASH Eating Plan °DASH stands for "Dietary Approaches to Stop Hypertension." The DASH eating plan is a healthy eating plan that has been shown to reduce high blood pressure (hypertension). Additional health benefits may include reducing the risk of type 2 diabetes mellitus, heart disease, and stroke. The DASH eating plan may also help with weight loss. °WHAT DO I NEED TO KNOW ABOUT THE DASH EATING PLAN? °For the DASH eating plan, you will follow these general guidelines: °· Choose foods with a percent daily value for sodium of less than 5% (as listed on the food label). °· Use salt-free seasonings or herbs instead of table salt or sea salt. °· Check with your health care provider or pharmacist before using salt substitutes. °· Eat lower-sodium products, often labeled as "lower sodium" or "no salt added." °· Eat fresh foods. °· Eat more vegetables, fruits, and low-fat dairy products. °· Choose whole grains. Look for the word "whole" as the first word in the ingredient list. °· Choose fish and skinless chicken or turkey more often than red meat. Limit fish, poultry, and meat to 6 oz (170 g) each day. °· Limit sweets, desserts, sugars, and sugary drinks. °· Choose heart-healthy fats. °· Limit cheese to 1 oz (28 g) per day. °· Eat more home-cooked food and less restaurant, buffet, and fast food. °· Limit fried foods. °· Cook foods using methods other than frying. °· Limit canned vegetables. If you do use them, rinse them well to decrease the sodium. °· When eating at a restaurant, ask that your food be prepared with less salt, or no salt if possible. °WHAT FOODS CAN I EAT? °Seek help from a dietitian for individual calorie needs. °Grains °Whole grain or whole wheat bread. Brown rice. Whole grain or whole wheat pasta. Quinoa, bulgur, and whole grain cereals. Low-sodium cereals. Corn or whole wheat flour tortillas. Whole grain cornbread. Whole grain crackers. Low-sodium crackers. °Vegetables °Fresh or frozen vegetables  (raw, steamed, roasted, or grilled). Low-sodium or reduced-sodium tomato and vegetable juices. Low-sodium or reduced-sodium tomato sauce and paste. Low-sodium or reduced-sodium canned vegetables.  °Fruits °All fresh, canned (in natural juice), or frozen fruits. °Meat and Other Protein Products °Ground beef (85% or leaner), grass-fed beef, or beef trimmed of fat. Skinless chicken or turkey. Ground chicken or turkey. Pork trimmed of fat. All fish and seafood. Eggs. Dried beans, peas, or lentils. Unsalted nuts and seeds. Unsalted canned beans. °Dairy °Low-fat dairy products, such as skim or 1% milk, 2% or reduced-fat cheeses, low-fat ricotta or cottage cheese, or plain low-fat yogurt. Low-sodium or reduced-sodium cheeses. °Fats and Oils °Tub margarines without trans fats. Light or reduced-fat mayonnaise and salad dressings (reduced sodium). Avocado. Safflower, olive, or canola oils. Natural peanut or almond butter. °Other °Unsalted popcorn and pretzels. °The items listed above may not be a complete list of recommended foods or beverages. Contact your dietitian for more options. °WHAT FOODS ARE NOT RECOMMENDED? °Grains °White bread. White pasta. White rice. Refined cornbread. Bagels and croissants. Crackers that contain trans fat. °Vegetables °Creamed or fried vegetables. Vegetables in a cheese sauce. Regular canned vegetables. Regular canned tomato sauce and paste. Regular tomato and vegetable juices. °Fruits °Dried fruits. Canned fruit in light or heavy syrup. Fruit juice. °Meat and Other Protein Products °Fatty cuts of meat. Ribs, chicken wings, bacon, sausage, bologna, salami, chitterlings, fatback, hot dogs, bratwurst, and packaged luncheon meats. Salted nuts and seeds. Canned beans with salt. °Dairy °Whole or 2% milk, cream, half-and-half, and cream cheese. Whole-fat or sweetened yogurt. Full-fat   cheeses or blue cheese. Nondairy creamers and whipped toppings. Processed cheese, cheese spreads, or cheese  curds. °Condiments °Onion and garlic salt, seasoned salt, table salt, and sea salt. Canned and packaged gravies. Worcestershire sauce. Tartar sauce. Barbecue sauce. Teriyaki sauce. Soy sauce, including reduced sodium. Steak sauce. Fish sauce. Oyster sauce. Cocktail sauce. Horseradish. Ketchup and mustard. Meat flavorings and tenderizers. Bouillon cubes. Hot sauce. Tabasco sauce. Marinades. Taco seasonings. Relishes. °Fats and Oils °Butter, stick margarine, lard, shortening, ghee, and bacon fat. Coconut, palm kernel, or palm oils. Regular salad dressings. °Other °Pickles and olives. Salted popcorn and pretzels. °The items listed above may not be a complete list of foods and beverages to avoid. Contact your dietitian for more information. °WHERE CAN I FIND MORE INFORMATION? °National Heart, Lung, and Blood Institute: www.nhlbi.nih.gov/health/health-topics/topics/dash/ °Document Released: 11/21/2011 Document Revised: 04/18/2014 Document Reviewed: 10/06/2013 °ExitCare® Patient Information ©2015 [employer]. This information is not intended to replace advice given to you by your health care provider. Make sure you discuss any questions you have with your health care provider. ° °

## 2015-08-04 NOTE — Assessment & Plan Note (Signed)
Better now No meds needed

## 2015-08-04 NOTE — Assessment & Plan Note (Signed)
Calves are thick but no pitting No meds--just uses elevation

## 2015-08-04 NOTE — Assessment & Plan Note (Signed)
See social history 

## 2015-08-04 NOTE — Progress Notes (Signed)
Subjective:    Patient ID: Autumn Price, female    DOB: 08-Jun-1941, 74 y.o.   MRN: [id number]  HPI Here for Medicare wellness and follow up of chronic medical conditions Reviewed form and advanced directives Reviewed other doctors Doesn't take flu shots--adamant about that Did have 1 fall running up steps with arms full--"my own fault". No injury No sig depression or anhedonia No tobacco or alcohol Independent with instrumental ADLs-- still works full time No cognitive problems Does try exercise regularly--rides the bike  Doing well Infection cleared in breast  Mood is better Not anhedonic Hasn't needed the lorazepam Nephew gone--okay being alone Goes out with son and wfie  Still with knee pain Going to [facility] orthopedics Getting synvisc injections with some help Has meloxicam and tramadol  No chest pain No SOB No dizziness or syncope Some edema--- gone in the AM No headaches for the most part  Current Outpatient Prescriptions on File Prior to Visit  Medication Sig Dispense Refill  . acetaminophen (TYLENOL) 500 MG tablet Take 500 mg by mouth every 6 (six) hours as needed.      Marland Kitchen amLODipine (NORVASC) 5 MG tablet Take 1 tablet (5 mg total) by mouth daily. 30 tablet 11  . meloxicam (MOBIC) 15 MG tablet Take 1 tablet (15 mg total) by mouth daily. As needed for arthritis pain 30 tablet 11  . Multiple Vitamins-Minerals (CENTRUM SILVER ULTRA WOMENS) TABS Take 1 tablet by mouth daily.    . traMADol (ULTRAM) 50 MG tablet Take 1-2 tablets (50-100 mg total) by mouth 2 (two) times daily as needed. 120 tablet 0   No current facility-administered medications on file prior to visit.    No Known Allergies  Past Medical History  Diagnosis Date  . Hypertension   . Osteoarthritis   . Venous insufficiency   . Urge incontinence     Past Surgical History  Procedure Laterality Date  . Appendectomy  1966  . Tubal ligation  1966  . Dilation and curettage of uterus  01/27/14     benign    Family History  Problem Relation Age of Onset  . Heart disease Father     died age 45- MI  . Alcohol abuse Brother   . Heart disease Brother   . Heart disease Sister     Social History   Social History  . Marital Status: Divorced    Spouse Name: N/A  . Number of Children: 3  . 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 Smoking status: Never Smoker   . Smokeless tobacco: Never Used  . Alcohol Use: No  . Drug Use: No  . Sexual Activity: Not on file   Other Topics Concern  . Not on file   Social History Narrative   Has living will   Designated son Ronny Flurry to make health care decisions for her   Would accept CPR/resuscitation   Hasn't considered tube feedings--would leave to son   Review of Systems Sleeps well Appetite is fine Weight up slightly Teeth okay---seeing dentist in 2 weeks Wears seat belt No pollen allergies No heartburn or swallowing problems No bowel problems No urinary problems    Objective:   Physical Exam  Constitutional: She is oriented to person, place, and time. She appears well-developed and well-nourished. No distress.  HENT:  Mouth/Throat: Oropharynx is clear and moist. No oropharyngeal exudate.  Neck: Normal range of motion. Neck supple. No thyromegaly present.  Cardiovascular: Normal rate, regular rhythm, normal heart sounds and intact distal pulses.  Exam reveals no gallop.   No murmur heard. Pulmonary/Chest: Effort normal and breath sounds normal. No respiratory distress. She has no wheezes. She has no rales.  Abdominal: Soft. There is no tenderness.  Musculoskeletal: She exhibits no edema or tenderness.  Lymphadenopathy:    She has no cervical adenopathy.  Neurological: She is alert and oriented to person, place, and time.  President-- "Susa Loffler, Felix Pacini, Reagan...." 100-93-  Can't do numbers D-l-r-o-w Recall 2/3  Skin:  No rash noted.  Psychiatric: She has a normal mood and affect. Her behavior is normal.          Assessment & Plan:

## 2015-08-04 NOTE — Assessment & Plan Note (Signed)
Discussed healthy eating DASH info given

## 2015-08-04 NOTE — Assessment & Plan Note (Signed)
BP Readings from Last 3 Encounters:  08/04/15 138/60  12/13/14 160/80  08/12/14 140/70   Acceptable control No change needed

## 2015-08-04 NOTE — Assessment & Plan Note (Signed)
I have personally reviewed the Medicare Annual Wellness questionnaire and have noted 1. The patient's medical and social history 2. Their use of alcohol, tobacco or illicit drugs 3. Their current medications and supplements 4. The patient's functional ability including ADL's, fall risks, home safety risks and hearing or visual             impairment. 5. Diet and physical activities 6. Evidence for depression or mood disorders  The patients weight, height, BMI and visual acuity have been recorded in the chart I have made referrals, counseling and provided education to the patient based review of the above and I have provided the pt with a written personalized care plan for preventive services.  I have provided you with a copy of your personalized plan for preventive services. Please take the time to review along with your updated medication list.  Will not take flu vaccine mammo due 8/16 Will do fecal immunoassay Order for zostavax---she will check cost

## 2015-08-04 NOTE — Progress Notes (Signed)
Pre visit review using our clinic review tool, if applicable. No additional management support is needed unless otherwise documented below in the visit note. 

## 2015-08-08 ENCOUNTER — Encounter: Payer: Self-pay | Admitting: *Deleted

## 2015-08-11 ENCOUNTER — Ambulatory Visit: Payer: Medicare Other | Admitting: Internal Medicine

## 2015-08-15 ENCOUNTER — Other Ambulatory Visit: Payer: Self-pay

## 2015-08-15 MED ORDER — TRAMADOL HCL 50 MG PO TABS: 50.0000 mg | ORAL_TABLET | Freq: Two times a day (BID) | ORAL | Status: DC | PRN

## 2015-08-15 NOTE — Telephone Encounter (Signed)
Last filled 07/13/15 #120--please advise if okay to refill

## 2015-08-15 NOTE — Telephone Encounter (Signed)
Approved: okay #120 x 0 

## 2015-08-15 NOTE — Telephone Encounter (Signed)
rx called into pharmacy

## 2015-08-16 ENCOUNTER — Other Ambulatory Visit (INDEPENDENT_AMBULATORY_CARE_PROVIDER_SITE_OTHER): Payer: Medicare Other

## 2015-08-16 DIAGNOSIS — Z1211 Encounter for screening for malignant neoplasm of colon: Secondary | ICD-10-CM | POA: Diagnosis not present

## 2015-08-16 LAB — FECAL OCCULT BLOOD, IMMUNOCHEMICAL: Fecal Occult Bld: NEGATIVE

## 2015-08-17 ENCOUNTER — Encounter: Payer: Self-pay | Admitting: *Deleted

## 2015-09-14 ENCOUNTER — Other Ambulatory Visit: Payer: Self-pay | Admitting: *Deleted

## 2015-09-14 MED ORDER — TRAMADOL HCL 50 MG PO TABS: 50.0000 mg | ORAL_TABLET | Freq: Two times a day (BID) | ORAL | Status: DC | PRN

## 2015-09-14 NOTE — Telephone Encounter (Signed)
rx called into pharmacy

## 2015-09-14 NOTE — Telephone Encounter (Signed)
Approved: #120 x 0 

## 2015-09-14 NOTE — Telephone Encounter (Signed)
08/15/2015 

## 2015-10-12 ENCOUNTER — Other Ambulatory Visit: Payer: Self-pay | Admitting: *Deleted

## 2015-10-12 MED ORDER — TRAMADOL HCL 50 MG PO TABS: 50.0000 mg | ORAL_TABLET | Freq: Two times a day (BID) | ORAL | Status: DC | PRN

## 2015-10-12 NOTE — Telephone Encounter (Signed)
rx called into pharmacy

## 2015-10-12 NOTE — Telephone Encounter (Signed)
09/14/2015 

## 2015-10-12 NOTE — Telephone Encounter (Signed)
Approved: #120 x 0 

## 2015-11-03 ENCOUNTER — Other Ambulatory Visit: Payer: Self-pay | Admitting: *Deleted

## 2015-11-03 NOTE — Telephone Encounter (Signed)
10/12/2015, early?

## 2015-11-04 NOTE — Telephone Encounter (Signed)
Approved:okay #120 x 0 on Wednesday (the 23rd), unless she is leaving town before

## 2015-11-06 MED ORDER — TRAMADOL HCL 50 MG PO TABS: 50.0000 mg | ORAL_TABLET | Freq: Two times a day (BID) | ORAL | Status: DC | PRN

## 2015-11-06 NOTE — Telephone Encounter (Signed)
rx called into pharmacy

## 2015-11-13 ENCOUNTER — Other Ambulatory Visit: Payer: Self-pay | Admitting: *Deleted

## 2015-12-07 ENCOUNTER — Other Ambulatory Visit: Payer: Self-pay | Admitting: *Deleted

## 2015-12-07 MED ORDER — TRAMADOL HCL 50 MG PO TABS: 50.0000 mg | ORAL_TABLET | Freq: Two times a day (BID) | ORAL | Status: DC | PRN

## 2015-12-07 NOTE — Telephone Encounter (Signed)
Last filled 11/06/2015

## 2015-12-07 NOTE — Telephone Encounter (Signed)
Approved: #120 x 0 

## 2015-12-07 NOTE — Telephone Encounter (Signed)
rx called into pharmacy

## 2016-01-10 ENCOUNTER — Other Ambulatory Visit: Payer: Self-pay | Admitting: *Deleted

## 2016-01-10 NOTE — Telephone Encounter (Signed)
12/07/2015 

## 2016-01-11 MED ORDER — TRAMADOL HCL 50 MG PO TABS: 50.0000 mg | ORAL_TABLET | Freq: Two times a day (BID) | ORAL | Status: DC | PRN

## 2016-01-11 NOTE — Telephone Encounter (Signed)
Approved: #120 x 0 

## 2016-01-11 NOTE — Telephone Encounter (Signed)
rx called into pharmacy

## 2016-02-02 DIAGNOSIS — M170 Bilateral primary osteoarthritis of knee: Secondary | ICD-10-CM | POA: Diagnosis not present

## 2016-02-02 DIAGNOSIS — M1711 Unilateral primary osteoarthritis, right knee: Secondary | ICD-10-CM | POA: Diagnosis not present

## 2016-02-02 DIAGNOSIS — M1712 Unilateral primary osteoarthritis, left knee: Secondary | ICD-10-CM | POA: Diagnosis not present

## 2016-02-08 ENCOUNTER — Other Ambulatory Visit: Payer: Self-pay

## 2016-02-08 NOTE — Telephone Encounter (Signed)
Approved: #120 x 0 

## 2016-02-08 NOTE — Telephone Encounter (Signed)
Last filled 01/11/16

## 2016-02-09 DIAGNOSIS — M1711 Unilateral primary osteoarthritis, right knee: Secondary | ICD-10-CM | POA: Diagnosis not present

## 2016-02-09 DIAGNOSIS — M1712 Unilateral primary osteoarthritis, left knee: Secondary | ICD-10-CM | POA: Diagnosis not present

## 2016-02-09 MED ORDER — TRAMADOL HCL 50 MG PO TABS: 50.0000 mg | ORAL_TABLET | Freq: Two times a day (BID) | ORAL | Status: DC | PRN

## 2016-02-09 NOTE — Telephone Encounter (Signed)
Called in refill on pharmacy voice mail  

## 2016-02-16 DIAGNOSIS — M1711 Unilateral primary osteoarthritis, right knee: Secondary | ICD-10-CM | POA: Diagnosis not present

## 2016-02-16 DIAGNOSIS — M170 Bilateral primary osteoarthritis of knee: Secondary | ICD-10-CM | POA: Diagnosis not present

## 2016-02-16 DIAGNOSIS — M1712 Unilateral primary osteoarthritis, left knee: Secondary | ICD-10-CM | POA: Diagnosis not present

## 2016-03-08 ENCOUNTER — Other Ambulatory Visit: Payer: Self-pay

## 2016-03-08 NOTE — Telephone Encounter (Signed)
Received another request for tramadol. It was called in yesterday

## 2016-03-11 ENCOUNTER — Other Ambulatory Visit: Payer: Self-pay

## 2016-03-11 MED ORDER — TRAMADOL HCL 50 MG PO TABS: 50.0000 mg | ORAL_TABLET | Freq: Two times a day (BID) | ORAL | Status: DC | PRN

## 2016-03-11 NOTE — Telephone Encounter (Signed)
Refill given to Kenney Housemananya at pharmacy

## 2016-03-11 NOTE — Telephone Encounter (Signed)
Approved: #120 x 0 

## 2016-03-11 NOTE — Telephone Encounter (Signed)
Last OV 01-30-16 Last OV 08-04-15 Next OV 08-09-16

## 2016-04-09 ENCOUNTER — Other Ambulatory Visit: Payer: Self-pay

## 2016-04-09 MED ORDER — TRAMADOL HCL 50 MG PO TABS: 50.0000 mg | ORAL_TABLET | Freq: Two times a day (BID) | ORAL | Status: DC | PRN

## 2016-04-09 NOTE — Telephone Encounter (Signed)
Verbal refill given to [name] at pharmacy

## 2016-04-09 NOTE — Telephone Encounter (Signed)
Last filled 03-11-16 #120. Last OV 08-04-15. Next OV 08-09-16

## 2016-04-09 NOTE — Telephone Encounter (Signed)
Approved: #120 x 0 

## 2016-04-26 ENCOUNTER — Telehealth: Payer: Self-pay | Admitting: Internal Medicine

## 2016-04-26 NOTE — Telephone Encounter (Signed)
Spoke with Zella BallRobin at front desk; pt has [employer] medicare as primary ins and medicaid as secondary coverage;Please advise.

## 2016-04-26 NOTE — Telephone Encounter (Signed)
I do not know who this needs to go to. Possibly Adrienne or Ricki MillerLinda Thomas.

## 2016-04-26 NOTE — Telephone Encounter (Signed)
Pt needs for a letter to be written to Medicaid stating that we can accept it in our office. Please advise

## 2016-04-26 NOTE — Telephone Encounter (Signed)
Explained to patient we don't participate with Medicaid, but we would still see her since she was already an existing patient.  Patient understands that we won't be writing a letter stating we accept Medicaid.

## 2016-05-08 ENCOUNTER — Other Ambulatory Visit: Payer: Self-pay

## 2016-05-08 MED ORDER — TRAMADOL HCL 50 MG PO TABS: 50.0000 mg | ORAL_TABLET | Freq: Two times a day (BID) | ORAL | Status: DC | PRN

## 2016-05-08 NOTE — Telephone Encounter (Signed)
Last filled 04-09-16 #120 Last OV 08-03-16 Next OV 08-09-16

## 2016-05-08 NOTE — Telephone Encounter (Signed)
Approved: #120 x 0 

## 2016-06-10 ENCOUNTER — Other Ambulatory Visit: Payer: Self-pay

## 2016-06-10 MED ORDER — AMLODIPINE BESYLATE 5 MG PO TABS
5.0000 mg | ORAL_TABLET | Freq: Every day | ORAL | Status: DC
Start: 2016-06-10 — End: 2016-08-09

## 2016-06-10 NOTE — Telephone Encounter (Signed)
Rx sent electronically.  

## 2016-06-10 NOTE — Telephone Encounter (Signed)
Last filled 05-08-16 #120 Last OV 08-03-16 Next OV 08-09-16 Could you fill in Dr Karle StarchLetvak's absence?

## 2016-06-11 NOTE — Telephone Encounter (Signed)
To PCP

## 2016-06-12 MED ORDER — TRAMADOL HCL 50 MG PO TABS: 50.0000 mg | ORAL_TABLET | Freq: Two times a day (BID) | ORAL | Status: DC | PRN

## 2016-06-12 NOTE — Telephone Encounter (Signed)
Approved: #120 x 0 

## 2016-06-12 NOTE — Telephone Encounter (Signed)
Called in rx on voice mail at pharmacy 

## 2016-07-10 ENCOUNTER — Telehealth: Payer: Self-pay | Admitting: *Deleted

## 2016-07-10 MED ORDER — MELOXICAM 15 MG PO TABS: 15.0000 mg | ORAL_TABLET | Freq: Every day | ORAL | 0 refills | Status: DC

## 2016-07-11 ENCOUNTER — Other Ambulatory Visit: Payer: Self-pay

## 2016-07-11 MED ORDER — TRAMADOL HCL 50 MG PO TABS: 50.0000 mg | ORAL_TABLET | Freq: Two times a day (BID) | ORAL | 0 refills | Status: DC | PRN

## 2016-07-11 NOTE — Telephone Encounter (Signed)
Patient called and said she's out of her medication and is hoping to get it refilled today.  Please call patient when medication is called in to pharmacy.

## 2016-07-11 NOTE — Telephone Encounter (Deleted)
Last filled 06-12-16 #120. Last OV 08-03-16 Next OV 08-09-16. Forward to Dr Para March in Dr Karle Starch absence.

## 2016-07-11 NOTE — Telephone Encounter (Signed)
Pt left v/m requesting refill tramadol to [facility]. Last refilled # 120 on 06/12/16; last annual exam on 08/04/15. Pt has CPX scheduled on 08/09/16. Dr Alphonsus Sias out of office until 07/15/16.Please advise. Pt request cb.

## 2016-07-11 NOTE — Telephone Encounter (Signed)
Approved today and sent to [facility].

## 2016-07-11 NOTE — Telephone Encounter (Signed)
Verbal refill given to Vikki Ports at the pharmacy

## 2016-07-12 NOTE — Telephone Encounter (Signed)
I called it in yesterday to the pharmacy. See previous note.

## 2016-08-09 ENCOUNTER — Encounter: Payer: Self-pay | Admitting: Internal Medicine

## 2016-08-09 ENCOUNTER — Ambulatory Visit (INDEPENDENT_AMBULATORY_CARE_PROVIDER_SITE_OTHER): Payer: Medicare Other | Admitting: Internal Medicine

## 2016-08-09 VITALS — BP 132/72 | HR 75 | Temp 97.4°F | Ht 63.0 in | Wt 247.0 lb

## 2016-08-09 DIAGNOSIS — Z0000 Encounter for general adult medical examination without abnormal findings: Secondary | ICD-10-CM

## 2016-08-09 DIAGNOSIS — M8949 Other hypertrophic osteoarthropathy, multiple sites: Secondary | ICD-10-CM

## 2016-08-09 DIAGNOSIS — Z6841 Body Mass Index (BMI) 40.0 and over, adult: Secondary | ICD-10-CM | POA: Diagnosis not present

## 2016-08-09 DIAGNOSIS — M150 Primary generalized (osteo)arthritis: Secondary | ICD-10-CM

## 2016-08-09 DIAGNOSIS — M159 Polyosteoarthritis, unspecified: Secondary | ICD-10-CM

## 2016-08-09 DIAGNOSIS — I10 Essential (primary) hypertension: Secondary | ICD-10-CM

## 2016-08-09 DIAGNOSIS — I872 Venous insufficiency (chronic) (peripheral): Secondary | ICD-10-CM

## 2016-08-09 DIAGNOSIS — Z7189 Other specified counseling: Secondary | ICD-10-CM

## 2016-08-09 DIAGNOSIS — Z1211 Encounter for screening for malignant neoplasm of colon: Secondary | ICD-10-CM

## 2016-08-09 LAB — CBC WITH DIFFERENTIAL/PLATELET
Basophils Absolute: 0 10*3/uL (ref 0.0–0.1)
Basophils Relative: 0.5 % (ref 0.0–3.0)
Eosinophils Absolute: 0.1 10*3/uL (ref 0.0–0.7)
Eosinophils Relative: 2.1 % (ref 0.0–5.0)
HCT: 43.9 % (ref 36.0–46.0)
Hemoglobin: 14.7 g/dL (ref 12.0–15.0)
Lymphocytes Relative: 25.1 % (ref 12.0–46.0)
Lymphs Abs: 1.6 10*3/uL (ref 0.7–4.0)
MCHC: 33.4 g/dL (ref 30.0–36.0)
MCV: 86.1 fl (ref 78.0–100.0)
Monocytes Absolute: 0.6 10*3/uL (ref 0.1–1.0)
Monocytes Relative: 9.1 % (ref 3.0–12.0)
Neutro Abs: 3.9 10*3/uL (ref 1.4–7.7)
Neutrophils Relative %: 63.2 % (ref 43.0–77.0)
Platelets: 163 10*3/uL (ref 150.0–400.0)
RBC: 5.1 Mil/uL (ref 3.87–5.11)
RDW: 13.5 % (ref 11.5–15.5)
WBC: 6.2 10*3/uL (ref 4.0–10.5)

## 2016-08-09 LAB — COMPREHENSIVE METABOLIC PANEL
ALT: 11 U/L (ref 0–35)
AST: 16 U/L (ref 0–37)
Albumin: 4.5 g/dL (ref 3.5–5.2)
Alkaline Phosphatase: 81 U/L (ref 39–117)
BUN: 18 mg/dL (ref 6–23)
CO2: 31 mEq/L (ref 19–32)
Calcium: 9.6 mg/dL (ref 8.4–10.5)
Chloride: 103 mEq/L (ref 96–112)
Creatinine, Ser: 0.69 mg/dL (ref 0.40–1.20)
GFR: 88.64 mL/min (ref >60.00)
Glucose, Bld: 95 mg/dL (ref 70–99)
Potassium: 4.3 mEq/L (ref 3.5–5.1)
Sodium: 140 mEq/L (ref 135–145)
Total Bilirubin: 0.8 mg/dL (ref 0.2–1.2)
Total Protein: 7.6 g/dL (ref 6.0–8.3)

## 2016-08-09 LAB — LIPID PANEL
Cholesterol: 185 mg/dL (ref 0–200)
HDL: 59.1 mg/dL (ref >39.00)
LDL Cholesterol: 102 mg/dL — ABNORMAL HIGH (ref 0–99)
NonHDL: 125.44
Total CHOL/HDL Ratio: 3
Triglycerides: 116 mg/dL (ref 0.0–149.0)
VLDL: 23.2 mg/dL (ref 0.0–40.0)

## 2016-08-09 LAB — T4, FREE: Free T4: 0.92 ng/dL (ref 0.60–1.60)

## 2016-08-09 MED ORDER — TRAMADOL HCL 50 MG PO TABS: 50.0000 mg | ORAL_TABLET | Freq: Two times a day (BID) | ORAL | 0 refills | Status: DC | PRN

## 2016-08-09 MED ORDER — AMLODIPINE BESYLATE 5 MG PO TABS: 5.0000 mg | ORAL_TABLET | Freq: Every day | ORAL | 11 refills | Status: DC

## 2016-08-09 MED ORDER — MELOXICAM 15 MG PO TABS: 15.0000 mg | ORAL_TABLET | Freq: Every day | ORAL | 11 refills | Status: DC

## 2016-08-09 NOTE — Assessment & Plan Note (Signed)
I have personally reviewed the Medicare Annual Wellness questionnaire and have noted 1. The patient's medical and social history 2. Their use of alcohol, tobacco or illicit drugs 3. Their current medications and supplements 4. The patient's functional ability including ADL's, fall risks, home safety risks and hearing or visual             impairment. 5. Diet and physical activities 6. Evidence for depression or mood disorders  The patients weight, height, BMI and visual acuity have been recorded in the chart I have made referrals, counseling and provided education to the patient based review of the above and I have provided the pt with a written personalized care plan for preventive services.  I have provided you with a copy of your personalized plan for preventive services. Please take the time to review along with your updated medication list.  Will check into the zostavax again Doesn't take flu vaccine Due for mammogram FIT

## 2016-08-09 NOTE — Assessment & Plan Note (Signed)
BP Readings from Last 3 Encounters:  08/09/16 132/72  08/04/15 138/60  12/13/14 (!) 160/80   Good control

## 2016-08-09 NOTE — Patient Instructions (Signed)
Please set up your screening mammogram. 

## 2016-08-09 NOTE — Progress Notes (Signed)
Subjective:    Patient ID: Autumn Price, female    DOB: 1941-04-03, 75 y.o.   MRN: [id number]  HPI Here for Medicare wellness visit and follow up on chronic medical conditions Reviewed form and advanced directives Reviewed other doctors No alcohol or tobacco Tries to do some exercise---bike and walking when she can 1 fall (tripped on flooring strip)--no injury Vision and hearing are okay  Is on Medicaid now [facility] Medicare advantage She will recheck about the shingles vaccine  Just retired in April Now DIL with cancer--- taking her to [facility] regularly now  Ongoing arthritis pain Due for synvisc injections again meloxicam and daily tramadol (in AM)  Tries to eat healthy Not consistent with this  No chest pain No palpitations No dizziness or syncope No SOB Right foot swells some--not troubling  Current Outpatient Prescriptions on File Prior to Visit  Medication Sig Dispense Refill  . acetaminophen (TYLENOL) 500 MG tablet Take 500 mg by mouth every 6 (six) hours as needed.      Marland Kitchen amLODipine (NORVASC) 5 MG tablet Take 1 tablet (5 mg total) by mouth daily. 30 tablet 1  . meloxicam (MOBIC) 15 MG tablet Take 1 tablet (15 mg total) by mouth daily. As needed for arthritis pain 30 tablet 0  . Multiple Vitamins-Minerals (CENTRUM SILVER ULTRA WOMENS) TABS Take 1 tablet by mouth daily.    . traMADol (ULTRAM) 50 MG tablet Take 1-2 tablets (50-100 mg total) by mouth 2 (two) times daily as needed. 120 tablet 0   No current facility-administered medications on file prior to visit.     No Known Allergies  Past Medical History:  Diagnosis Date  . Hypertension   . Osteoarthritis   . Urge incontinence   . Venous insufficiency     Past Surgical History:  Procedure Laterality Date  . APPENDECTOMY  1966  . DILATION AND CURETTAGE OF UTERUS  01/27/14   benign  . TUBAL LIGATION  1966    Family History  Problem Relation Age of Onset  . Heart disease Father     died age 43- MI  .  Alcohol abuse Brother   . Heart disease Brother   . Heart disease Sister     Social History   Social History  . Marital status: Divorced    Spouse name: N/A  . Number of children: 3  . Years of education: N/A   Occupational History  . Sock folder---retired     [employer]   Social History Main Topics  . Smoking status: Never Smoker  . Smokeless tobacco: Never Used  . Alcohol use No  . Drug use: No  . Sexual activity: Not on file   Other Topics Concern  . Not on file   Social History Narrative   Has living will   Designated son Ronny Flurry to make health care decisions for her   Would accept CPR/resuscitation   Hasn't considered tube feedings--would leave to son   Review of Systems No mood issues now--hasn't had the crying, etc Usually sleeps okay--unless stuff on her mind Appetite is fine Weight is stable Wears seat belt Teeth okay--keeps up with dentist (Dr Theone Stanley) Bowels are okay No skin rash or suspicious lesions Voids okay. Wears pad for ongoing urge incontinence.    Objective:   Physical Exam  Constitutional: She is oriented to person, place, and time. She appears well-developed and well-nourished. No distress.  HENT:  Mouth/Throat: Oropharynx is clear and moist. No oropharyngeal  exudate.  Neck: Normal range of motion. Neck supple. No thyromegaly present.  Cardiovascular: Normal rate, regular rhythm, normal heart sounds and intact distal pulses.  Exam reveals no gallop.   No murmur heard. Pulmonary/Chest: Effort normal and breath sounds normal. No respiratory distress. She has no wheezes. She has no rales.  Abdominal: There is no tenderness.  Musculoskeletal: She exhibits no tenderness.  2+ calves without pitting Knees are very thick without apparent active synovitis  Lymphadenopathy:    She has no cervical adenopathy.  Neurological: She is alert and oriented to person, place, and time.  President--- "Lora Havensrump, Obama,  Clinton---(then) Bush" 100-93-?? D-r-l-o-w Recall 3/3  10th grade education  Skin: No rash noted. No erythema.  Psychiatric: She has a normal mood and affect. Her behavior is normal.          Assessment & Plan:

## 2016-08-09 NOTE — Assessment & Plan Note (Signed)
Thick calves but no Rx needed Doesn't use support hose--discussed

## 2016-08-09 NOTE — Assessment & Plan Note (Signed)
See social history 

## 2016-08-09 NOTE — Assessment & Plan Note (Signed)
Mostly knees Keeps up with ortho Discussed exercise/quad strengthening

## 2016-08-09 NOTE — Progress Notes (Signed)
Pre visit review using our clinic review tool, if applicable. No additional management support is needed unless otherwise documented below in the visit note. 

## 2016-08-09 NOTE — Assessment & Plan Note (Signed)
Discussed lifestyle changes.

## 2016-08-21 ENCOUNTER — Other Ambulatory Visit (INDEPENDENT_AMBULATORY_CARE_PROVIDER_SITE_OTHER): Payer: Medicare Other

## 2016-08-21 DIAGNOSIS — Z1211 Encounter for screening for malignant neoplasm of colon: Secondary | ICD-10-CM

## 2016-08-21 LAB — FECAL OCCULT BLOOD, IMMUNOCHEMICAL: Fecal Occult Bld: NEGATIVE

## 2016-08-30 ENCOUNTER — Encounter: Payer: Self-pay | Admitting: Internal Medicine

## 2016-08-30 ENCOUNTER — Ambulatory Visit (INDEPENDENT_AMBULATORY_CARE_PROVIDER_SITE_OTHER): Payer: Medicare Other | Admitting: Internal Medicine

## 2016-08-30 DIAGNOSIS — M170 Bilateral primary osteoarthritis of knee: Secondary | ICD-10-CM | POA: Diagnosis not present

## 2016-08-30 DIAGNOSIS — M1712 Unilateral primary osteoarthritis, left knee: Secondary | ICD-10-CM | POA: Insufficient documentation

## 2016-08-30 MED ORDER — METHYLPREDNISOLONE ACETATE 40 MG/ML IJ SUSP
40.0000 mg | Freq: Once | INTRAMUSCULAR | Status: AC
Start: 2016-08-30 — End: 2016-08-30
  Administered 2016-08-30: 80 mg via INTRAMUSCULAR

## 2016-08-30 MED ORDER — METHYLPREDNISOLONE ACETATE 80 MG/ML IJ SUSP: 80.0000 mg | Freq: Once | INTRAMUSCULAR | Status: DC

## 2016-08-30 NOTE — Addendum Note (Signed)
Addended by: Eual FinesBRIDGES, Kolbey Teichert P on: 08/30/2016 07:59 AM   Modules accepted: Orders

## 2016-08-30 NOTE — Progress Notes (Signed)
   Subjective:    Patient ID: Autumn Price, female    DOB: 03/03/1941, 75 y.o.   MRN: [id number]  HPI Here for ongoing knee pain Now on Medicaid secondary--trouble with ?referral for her ortho (Dr Thomasena Edisollins)  Did have visco supplementation in February Last cortisone a long time ago  Both knees are very bad--requests both be done  Current Outpatient Prescriptions on File Prior to Visit  Medication Sig Dispense Refill  . acetaminophen (TYLENOL) 500 MG tablet Take 500 mg by mouth every 6 (six) hours as needed.      Marland Kitchen. amLODipine (NORVASC) 5 MG tablet Take 1 tablet (5 mg total) by mouth daily. 30 tablet 11  . meloxicam (MOBIC) 15 MG tablet Take 1 tablet (15 mg total) by mouth daily. As needed for arthritis pain 30 tablet 11  . Multiple Vitamins-Minerals (CENTRUM SILVER ULTRA WOMENS) TABS Take 1 tablet by mouth daily.    . traMADol (ULTRAM) 50 MG tablet Take 1-2 tablets (50-100 mg total) by mouth 2 (two) times daily as needed. 120 tablet 0   No current facility-administered medications on file prior to visit.     No Known Allergies  Past Medical History:  Diagnosis Date  . Hypertension   . Osteoarthritis   . Urge incontinence   . Venous insufficiency     Past Surgical History:  Procedure Laterality Date  . APPENDECTOMY  1966  . DILATION AND CURETTAGE OF UTERUS  01/27/14   benign  . TUBAL LIGATION  1966    Family History  Problem Relation Age of Onset  . Heart disease Father     died age 73- MI  . Alcohol abuse Brother   . Heart disease Brother   . Heart disease Sister     Social History   Social History  . Marital status: Divorced    Spouse name: N/A  . Number of children: 3  . Years of education: N/A   Occupational History  . Sock folder---retired     [employer]   Social History Main Topics  . Smoking status: Never Smoker  . Smokeless tobacco: Never Used  . Alcohol use No  . Drug use: No  . Sexual activity: Not on file   Other Topics  Concern  . Not on file   Social History Narrative   Has living will   Designated son [name] to make health care decisions for her   Would accept CPR/resuscitation   Hasn't considered tube feedings--would leave to son   Review of Systems     Objective:   Physical Exam        Assessment & Plan:

## 2016-08-30 NOTE — Assessment & Plan Note (Signed)
Sterile prep for medial approach bilaterally Ethyl chloride then 2cc 2% plain lidocaine 40mg  depomedrol and 5cc 2% lidocaine instilled bilaterally Tolerated well Home care discussed

## 2016-08-30 NOTE — Progress Notes (Signed)
Pre visit review using our clinic review tool, if applicable. No additional management support is needed unless otherwise documented below in the visit note. 

## 2016-09-10 ENCOUNTER — Telehealth: Payer: Self-pay | Admitting: Internal Medicine

## 2016-09-10 ENCOUNTER — Other Ambulatory Visit: Payer: Self-pay

## 2016-09-10 MED ORDER — TRAMADOL HCL 50 MG PO TABS: 50.0000 mg | ORAL_TABLET | Freq: Two times a day (BID) | ORAL | 0 refills | Status: DC | PRN

## 2016-09-10 NOTE — Telephone Encounter (Signed)
Pt wants to speak to Clinical staff about an injection that PCP wanted her to have completed.   Best number to call is [phone]

## 2016-09-10 NOTE — Telephone Encounter (Signed)
Approved: #120 x 0 

## 2016-09-10 NOTE — Telephone Encounter (Signed)
Tramadol last filled 08-09-16 #120 Last OV 08-30-16 Next OV 08-11-17  Voltaren Gel was last written 03-03-14

## 2016-09-10 NOTE — Telephone Encounter (Signed)
[facility]

## 2016-09-10 NOTE — Telephone Encounter (Signed)
Pt was wanting to get the Zostavax. Gave verbal rx to Sam at pharmacy

## 2016-09-10 NOTE — Telephone Encounter (Signed)
Did you want to approve Voltaren Gel/ We do not have record of it in the past.

## 2016-09-19 ENCOUNTER — Telehealth: Payer: Self-pay

## 2016-09-19 MED ORDER — KETOCONAZOLE 2 % EX CREA: 1.0000 "application " | TOPICAL_CREAM | Freq: Two times a day (BID) | CUTANEOUS | 1 refills | Status: DC

## 2016-09-19 NOTE — Telephone Encounter (Signed)
Spoke to pt. She said cream is fine. I have sent it in to [facility] for her

## 2016-09-19 NOTE — Telephone Encounter (Signed)
Okay to send ketoconazole 2% cream (60gm x 1) Apply bid If she prefers powder--can try nystatin powder

## 2016-09-19 NOTE — Telephone Encounter (Signed)
Crystal pts niece left v/m (do not see signed DPR) wanting to know if Dermacloud or fanny cream or diflucan could be sent to [facility]. Crystal thinks pt has yeast infection that has caused irritation area in groin.

## 2016-09-19 NOTE — Telephone Encounter (Signed)
Pt has a lot of leakage of urine if cough or sneezes; pt using pad to catch urine and perineal area is raw. Pt using A & D ointment and that has not been effective at all. Pt request med to [facility]; last annual 08/09/16. Pt request cb.

## 2016-09-24 ENCOUNTER — Other Ambulatory Visit: Payer: Self-pay | Admitting: Internal Medicine

## 2016-09-24 DIAGNOSIS — Z1231 Encounter for screening mammogram for malignant neoplasm of breast: Secondary | ICD-10-CM

## 2016-10-09 ENCOUNTER — Other Ambulatory Visit: Payer: Self-pay

## 2016-10-09 DIAGNOSIS — M170 Bilateral primary osteoarthritis of knee: Secondary | ICD-10-CM | POA: Diagnosis not present

## 2016-10-09 NOTE — Telephone Encounter (Signed)
Last filled 09-10-16 #120 Last OV 08-09-16 Next OV 08-11-17

## 2016-10-10 MED ORDER — TRAMADOL HCL 50 MG PO TABS: 50.0000 mg | ORAL_TABLET | Freq: Two times a day (BID) | ORAL | 0 refills | Status: DC | PRN

## 2016-10-10 NOTE — Telephone Encounter (Signed)
Forwarding to Dr Dayton MartesAron in Dr Karle StarchLetvak's absence

## 2016-10-10 NOTE — Telephone Encounter (Signed)
Verbal refill given to [name] at the pharmacy 

## 2016-10-16 DIAGNOSIS — M170 Bilateral primary osteoarthritis of knee: Secondary | ICD-10-CM | POA: Diagnosis not present

## 2016-10-22 ENCOUNTER — Ambulatory Visit: Payer: Medicare Other

## 2016-10-23 DIAGNOSIS — M170 Bilateral primary osteoarthritis of knee: Secondary | ICD-10-CM | POA: Diagnosis not present

## 2016-11-04 ENCOUNTER — Telehealth: Payer: Self-pay | Admitting: Internal Medicine

## 2016-11-04 NOTE — Telephone Encounter (Signed)
Pt called to let us know that she got flu shot on Oct 16 and Shingles shot on Oct 4th. [facility]  Thanks

## 2016-11-04 NOTE — Telephone Encounter (Signed)
Thank you. I have documented them in the chart

## 2016-11-06 ENCOUNTER — Ambulatory Visit
Admission: RE | Admit: 2016-11-06 | Discharge: 2016-11-06 | Disposition: A | Discharge status: Home / Self Care | Payer: Medicare Other | Source: Ambulatory Visit | Attending: Internal Medicine | Admitting: Internal Medicine

## 2016-11-06 DIAGNOSIS — Z1231 Encounter for screening mammogram for malignant neoplasm of breast: Secondary | ICD-10-CM | POA: Insufficient documentation

## 2016-11-11 ENCOUNTER — Other Ambulatory Visit: Payer: Self-pay

## 2016-11-11 MED ORDER — TRAMADOL HCL 50 MG PO TABS: 50.0000 mg | ORAL_TABLET | Freq: Two times a day (BID) | ORAL | 0 refills | Status: DC | PRN

## 2016-11-11 NOTE — Telephone Encounter (Signed)
Last filled 10-10-16 #120 Last OV 08-09-16 Next OV 08-11-17

## 2016-11-11 NOTE — Telephone Encounter (Signed)
Verbal refill given to Diane at the pharmacy

## 2016-11-11 NOTE — Telephone Encounter (Signed)
Approved: #120 x 0 

## 2016-12-11 ENCOUNTER — Other Ambulatory Visit: Payer: Self-pay

## 2016-12-11 MED ORDER — TRAMADOL HCL 50 MG PO TABS: 50.0000 mg | ORAL_TABLET | Freq: Two times a day (BID) | ORAL | 0 refills | Status: DC | PRN

## 2016-12-11 NOTE — Telephone Encounter (Signed)
Last filled 11-11-16 #120 Last OV [facility] 08-09-16 Next OV 08-11-17

## 2016-12-11 NOTE — Telephone Encounter (Signed)
Approved: #120 x 0 

## 2016-12-11 NOTE — Telephone Encounter (Signed)
Verbal refill given to [name] at the pharmacy 

## 2017-01-09 ENCOUNTER — Other Ambulatory Visit: Payer: Self-pay

## 2017-01-09 NOTE — Telephone Encounter (Signed)
Approved: #120 x 0 

## 2017-01-09 NOTE — Telephone Encounter (Signed)
Tramadol last filled 12-11-16 #120 Last OV 08-30-16 Next OV 08-11-17

## 2017-01-10 MED ORDER — TRAMADOL HCL 50 MG PO TABS: 50.0000 mg | ORAL_TABLET | Freq: Two times a day (BID) | ORAL | 0 refills | Status: DC | PRN

## 2017-01-10 MED ORDER — KETOCONAZOLE 2 % EX CREA: 1.0000 "application " | TOPICAL_CREAM | Freq: Two times a day (BID) | CUTANEOUS | 1 refills | Status: DC

## 2017-01-10 NOTE — Telephone Encounter (Signed)
Left refill on voice mail at pharmacy  

## 2017-01-10 NOTE — Telephone Encounter (Signed)
Tried to call it in but line keeps ringing busy

## 2017-02-13 ENCOUNTER — Other Ambulatory Visit: Payer: Self-pay

## 2017-02-13 NOTE — Telephone Encounter (Signed)
Pt left v/m requesting refill tramadol to [facility]. Last refilled # 120 on 01/10/17 and last seen 08/30/16.

## 2017-02-14 MED ORDER — TRAMADOL HCL 50 MG PO TABS: 50.0000 mg | ORAL_TABLET | Freq: Two times a day (BID) | ORAL | 0 refills | Status: DC | PRN

## 2017-02-14 NOTE — Telephone Encounter (Signed)
Approved: #120 x 0 

## 2017-02-14 NOTE — Telephone Encounter (Signed)
Rx called in to requested pharmacy 

## 2017-03-12 ENCOUNTER — Other Ambulatory Visit: Payer: Self-pay

## 2017-03-12 MED ORDER — TRAMADOL HCL 50 MG PO TABS: 50.0000 mg | ORAL_TABLET | Freq: Two times a day (BID) | ORAL | 0 refills | Status: DC | PRN

## 2017-03-12 NOTE — Telephone Encounter (Signed)
Approved: #120 x 0 She is early-----make sure she knows not to call early again next month

## 2017-03-12 NOTE — Telephone Encounter (Signed)
Last filled 02-14-17 #120 Last OV 08-09-16 Next OV 08-12-17. Refill will be due over the weekend

## 2017-03-12 NOTE — Telephone Encounter (Signed)
Verbal refill given to Autumn. Asked her to make pt aware not to ask early next month

## 2017-03-24 DIAGNOSIS — M170 Bilateral primary osteoarthritis of knee: Secondary | ICD-10-CM | POA: Diagnosis not present

## 2017-04-15 ENCOUNTER — Other Ambulatory Visit: Payer: Self-pay

## 2017-04-15 NOTE — Telephone Encounter (Signed)
Last filled 03-12-17 Last OV 08-09-16 Next OV 08-12-17

## 2017-04-16 MED ORDER — TRAMADOL HCL 50 MG PO TABS: 50.0000 mg | ORAL_TABLET | Freq: Two times a day (BID) | ORAL | 0 refills | Status: DC | PRN

## 2017-04-16 NOTE — Telephone Encounter (Signed)
Verbal refill given to [name] at the pharmacy 

## 2017-04-16 NOTE — Telephone Encounter (Signed)
Approved: #120 x 0 

## 2017-05-15 ENCOUNTER — Other Ambulatory Visit: Payer: Self-pay

## 2017-05-15 MED ORDER — TRAMADOL HCL 50 MG PO TABS: 50.0000 mg | ORAL_TABLET | Freq: Two times a day (BID) | ORAL | 0 refills | Status: DC | PRN

## 2017-05-15 NOTE — Telephone Encounter (Signed)
Approved: #120 x 0 UDS at next visit

## 2017-05-15 NOTE — Telephone Encounter (Signed)
Verbal refill given to [name] at pharmacy 

## 2017-05-15 NOTE — Telephone Encounter (Signed)
Last filled 04-16-17 #120. Last OV 08-09-16 Next OV 08-12-17 No recent UDS

## 2017-06-16 DIAGNOSIS — M170 Bilateral primary osteoarthritis of knee: Secondary | ICD-10-CM | POA: Diagnosis not present

## 2017-06-17 ENCOUNTER — Other Ambulatory Visit: Payer: Self-pay

## 2017-06-17 MED ORDER — TRAMADOL HCL 50 MG PO TABS: 50.0000 mg | ORAL_TABLET | Freq: Two times a day (BID) | ORAL | 0 refills | Status: DC | PRN

## 2017-06-17 NOTE — Telephone Encounter (Signed)
Last filled 05-15-17 #120 Last OV/CPE 08-09-16 Next OV 08-12-17

## 2017-06-17 NOTE — Telephone Encounter (Signed)
Approved: #120 x 0 

## 2017-06-17 NOTE — Telephone Encounter (Signed)
Verbal refill given to Autumn at the pharmacy

## 2017-07-17 ENCOUNTER — Other Ambulatory Visit: Payer: Self-pay

## 2017-07-17 MED ORDER — TRAMADOL HCL 50 MG PO TABS: 50.0000 mg | ORAL_TABLET | Freq: Two times a day (BID) | ORAL | 0 refills | Status: DC | PRN

## 2017-07-17 NOTE — Telephone Encounter (Signed)
Last filled 06-17-17 #120 Last OV 08-09-16 Next OV 08-12-17

## 2017-07-17 NOTE — Telephone Encounter (Signed)
Verbal refill given to Autumn at pharmacy

## 2017-07-17 NOTE — Telephone Encounter (Signed)
Approved: #120 x 0 

## 2017-08-11 ENCOUNTER — Encounter: Payer: Medicare Other | Admitting: Internal Medicine

## 2017-08-12 ENCOUNTER — Ambulatory Visit (INDEPENDENT_AMBULATORY_CARE_PROVIDER_SITE_OTHER): Payer: Medicare Other | Admitting: Internal Medicine

## 2017-08-12 ENCOUNTER — Encounter: Payer: Self-pay | Admitting: Internal Medicine

## 2017-08-12 VITALS — BP 134/70 | HR 60 | Temp 97.6°F | Ht 63.0 in | Wt 248.1 lb

## 2017-08-12 DIAGNOSIS — Z23 Encounter for immunization: Secondary | ICD-10-CM | POA: Diagnosis not present

## 2017-08-12 DIAGNOSIS — I10 Essential (primary) hypertension: Secondary | ICD-10-CM

## 2017-08-12 DIAGNOSIS — Z0000 Encounter for general adult medical examination without abnormal findings: Secondary | ICD-10-CM | POA: Diagnosis not present

## 2017-08-12 DIAGNOSIS — M170 Bilateral primary osteoarthritis of knee: Secondary | ICD-10-CM | POA: Diagnosis not present

## 2017-08-12 DIAGNOSIS — F39 Unspecified mood [affective] disorder: Secondary | ICD-10-CM | POA: Diagnosis not present

## 2017-08-12 DIAGNOSIS — Z7189 Other specified counseling: Secondary | ICD-10-CM

## 2017-08-12 DIAGNOSIS — Z6841 Body Mass Index (BMI) 40.0 and over, adult: Secondary | ICD-10-CM

## 2017-08-12 DIAGNOSIS — Z1211 Encounter for screening for malignant neoplasm of colon: Secondary | ICD-10-CM

## 2017-08-12 LAB — COMPREHENSIVE METABOLIC PANEL
ALT: 12 U/L (ref 0–35)
AST: 16 U/L (ref 0–37)
Albumin: 4.3 g/dL (ref 3.5–5.2)
Alkaline Phosphatase: 73 U/L (ref 39–117)
BUN: 18 mg/dL (ref 6–23)
CO2: 33 mEq/L — ABNORMAL HIGH (ref 19–32)
Calcium: 10.1 mg/dL (ref 8.4–10.5)
Chloride: 104 mEq/L (ref 96–112)
Creatinine, Ser: 0.76 mg/dL (ref 0.40–1.20)
GFR: 79.07 mL/min (ref >60.00)
Glucose, Bld: 95 mg/dL (ref 70–99)
Potassium: 4.4 mEq/L (ref 3.5–5.1)
Sodium: 141 mEq/L (ref 135–145)
Total Bilirubin: 0.9 mg/dL (ref 0.2–1.2)
Total Protein: 7.3 g/dL (ref 6.0–8.3)

## 2017-08-12 LAB — CBC WITH DIFFERENTIAL/PLATELET
Basophils Absolute: 0.1 10*3/uL (ref 0.0–0.1)
Basophils Relative: 1.3 % (ref 0.0–3.0)
Eosinophils Absolute: 0.1 10*3/uL (ref 0.0–0.7)
Eosinophils Relative: 2 % (ref 0.0–5.0)
HCT: 44.3 % (ref 36.0–46.0)
Hemoglobin: 14.4 g/dL (ref 12.0–15.0)
Lymphocytes Relative: 24.3 % (ref 12.0–46.0)
Lymphs Abs: 1.2 10*3/uL (ref 0.7–4.0)
MCHC: 32.4 g/dL (ref 30.0–36.0)
MCV: 91.2 fl (ref 78.0–100.0)
Monocytes Absolute: 0.5 10*3/uL (ref 0.1–1.0)
Monocytes Relative: 10 % (ref 3.0–12.0)
Neutro Abs: 3 10*3/uL (ref 1.4–7.7)
Neutrophils Relative %: 62.4 % (ref 43.0–77.0)
Platelets: 161 10*3/uL (ref 150.0–400.0)
RBC: 4.86 Mil/uL (ref 3.87–5.11)
RDW: 14.2 % (ref 11.5–15.5)
WBC: 4.9 10*3/uL (ref 4.0–10.5)

## 2017-08-12 MED ORDER — MELOXICAM 15 MG PO TABS: 15.0000 mg | ORAL_TABLET | Freq: Every day | ORAL | 11 refills | Status: DC

## 2017-08-12 MED ORDER — TRAMADOL HCL 50 MG PO TABS: 50.0000 mg | ORAL_TABLET | Freq: Two times a day (BID) | ORAL | 0 refills | Status: DC | PRN

## 2017-08-12 MED ORDER — AMLODIPINE BESYLATE 5 MG PO TABS: 5.0000 mg | ORAL_TABLET | Freq: Every day | ORAL | 11 refills | Status: DC

## 2017-08-12 MED ORDER — SERTRALINE HCL 50 MG PO TABS: 50.0000 mg | ORAL_TABLET | Freq: Every day | ORAL | 3 refills | Status: DC

## 2017-08-12 NOTE — Patient Instructions (Addendum)
Please try glucosamine and chondroitin to see if that helps your knees. (over the counter). Please cut 3 of the sertraline in half, and start with 1/2 tab daily for the first 6 days. If you have no problems with that, increase to a full tab daily.   DASH Eating Plan DASH stands for "Dietary Approaches to Stop Hypertension." The DASH eating plan is a healthy eating plan that has been shown to reduce high blood pressure (hypertension). It may also reduce your risk for type 2 diabetes, heart disease, and stroke. The DASH eating plan may also help with weight loss. What are tips for following this plan? General guidelines  Avoid eating more than 2,300 mg (milligrams) of salt (sodium) a day. If you have hypertension, you may need to reduce your sodium intake to 1,500 mg a day.  Limit alcohol intake to no more than 1 drink a day for nonpregnant women and 2 drinks a day for men. One drink equals 12 oz of beer, 5 oz of wine, or 1 oz of hard liquor.  Work with your health care provider to maintain a healthy body weight or to lose weight. Ask what an ideal weight is for you.  Get at least 30 minutes of exercise that causes your heart to beat faster (aerobic exercise) most days of the week. Activities may include walking, swimming, or biking.  Work with your health care provider or diet and nutrition specialist (dietitian) to adjust your eating plan to your individual calorie needs. Reading food labels  Check food labels for the amount of sodium per serving. Choose foods with less than 5 percent of the Daily Value of sodium. Generally, foods with less than 300 mg of sodium per serving fit into this eating plan.  To find whole grains, look for the word "whole" as the first word in the ingredient list. Shopping  Buy products labeled as "low-sodium" or "no salt added."  Buy fresh foods. Avoid canned foods and premade or frozen meals. Cooking  Avoid adding salt when cooking. Use salt-free seasonings  or herbs instead of table salt or sea salt. Check with your health care provider or pharmacist before using salt substitutes.  Do not fry foods. Cook foods using healthy methods such as baking, boiling, grilling, and broiling instead.  Cook with heart-healthy oils, such as olive, canola, soybean, or sunflower oil. Meal planning   Eat a balanced diet that includes: ? 5 or more servings of fruits and vegetables each day. At each meal, try to fill half of your plate with fruits and vegetables. ? Up to 6-8 servings of whole grains each day. ? Less than 6 oz of lean meat, poultry, or fish each day. A 3-oz serving of meat is about the same size as a deck of cards. One egg equals 1 oz. ? 2 servings of low-fat dairy each day. ? A serving of nuts, seeds, or beans 5 times each week. ? Heart-healthy fats. Healthy fats called Omega-3 fatty acids are found in foods such as flaxseeds and coldwater fish, like sardines, salmon, and mackerel.  Limit how much you eat of the following: ? Canned or prepackaged foods. ? Food that is high in trans fat, such as fried foods. ? Food that is high in saturated fat, such as fatty meat. ? Sweets, desserts, sugary drinks, and other foods with added sugar. ? Full-fat dairy products.  Do not salt foods before eating.  Try to eat at least 2 vegetarian meals each week.  Eat  more home-cooked food and less restaurant, buffet, and fast food.  When eating at a restaurant, ask that your food be prepared with less salt or no salt, if possible. What foods are recommended? The items listed may not be a complete list. Talk with your dietitian about what dietary choices are best for you. Grains Whole-grain or whole-wheat bread. Whole-grain or whole-wheat pasta. Brown rice. Modena Morrow. Bulgur. Whole-grain and low-sodium cereals. Pita bread. Low-fat, low-sodium crackers. Whole-wheat flour tortillas. Vegetables Fresh or frozen vegetables (raw, steamed, roasted, or  grilled). Low-sodium or reduced-sodium tomato and vegetable juice. Low-sodium or reduced-sodium tomato sauce and tomato paste. Low-sodium or reduced-sodium canned vegetables. Fruits All fresh, dried, or frozen fruit. Canned fruit in natural juice (without added sugar). Meat and other protein foods Skinless chicken or [place]. Ground chicken or [place]. Pork with fat trimmed off. Fish and seafood. Egg whites. Dried beans, peas, or lentils. Unsalted nuts, nut butters, and seeds. Unsalted canned beans. Lean cuts of beef with fat trimmed off. Low-sodium, lean deli meat. Dairy Low-fat (1%) or fat-free (skim) milk. Fat-free, low-fat, or reduced-fat cheeses. Nonfat, low-sodium ricotta or cottage cheese. Low-fat or nonfat yogurt. Low-fat, low-sodium cheese. Fats and oils Soft margarine without trans fats. Vegetable oil. Low-fat, reduced-fat, or light mayonnaise and salad dressings (reduced-sodium). Canola, safflower, olive, soybean, and sunflower oils. Avocado. Seasoning and other foods Herbs. Spices. Seasoning mixes without salt. Unsalted popcorn and pretzels. Fat-free sweets. What foods are not recommended? The items listed may not be a complete list. Talk with your dietitian about what dietary choices are best for you. Grains Baked goods made with fat, such as croissants, muffins, or some breads. Dry pasta or rice meal packs. Vegetables Creamed or fried vegetables. Vegetables in a cheese sauce. Regular canned vegetables (not low-sodium or reduced-sodium). Regular canned tomato sauce and paste (not low-sodium or reduced-sodium). Regular tomato and vegetable juice (not low-sodium or reduced-sodium). Angie Fava. Olives. Fruits Canned fruit in a light or heavy syrup. Fried fruit. Fruit in cream or butter sauce. Meat and other protein foods Fatty cuts of meat. Ribs. Fried meat. Berniece Salines. Sausage. Bologna and other processed lunch meats. Salami. Fatback. Hotdogs. Bratwurst. Salted nuts and seeds. Canned beans with  added salt. Canned or smoked fish. Whole eggs or egg yolks. Chicken or [place] with skin. Dairy Whole or 2% milk, cream, and half-and-half. Whole or full-fat cream cheese. Whole-fat or sweetened yogurt. Full-fat cheese. Nondairy creamers. Whipped toppings. Processed cheese and cheese spreads. Fats and oils Butter. Stick margarine. Lard. Shortening. Ghee. Bacon fat. Tropical oils, such as coconut, palm kernel, or palm oil. Seasoning and other foods Salted popcorn and pretzels. Onion salt, garlic salt, seasoned salt, table salt, and sea salt. Worcestershire sauce. Tartar sauce. Barbecue sauce. Teriyaki sauce. Soy sauce, including reduced-sodium. Steak sauce. Canned and packaged gravies. Fish sauce. Oyster sauce. Cocktail sauce. Horseradish that you find on the shelf. Ketchup. Mustard. Meat flavorings and tenderizers. Bouillon cubes. Hot sauce and Tabasco sauce. Premade or packaged marinades. Premade or packaged taco seasonings. Relishes. Regular salad dressings. Where to find more information:  National Heart, Lung, and [place]: https://wilson-eaton.com/  American Heart Association: www.heart.org Summary  The DASH eating plan is a healthy eating plan that has been shown to reduce high blood pressure (hypertension). It may also reduce your risk for type 2 diabetes, heart disease, and stroke.  With the DASH eating plan, you should limit salt (sodium) intake to 2,300 mg a day. If you have hypertension, you may need to reduce your sodium intake to  1,500 mg a day.  When on the DASH eating plan, aim to eat more fresh fruits and vegetables, whole grains, lean proteins, low-fat dairy, and heart-healthy fats.  Work with your health care provider or diet and nutrition specialist (dietitian) to adjust your eating plan to your individual calorie needs. This information is not intended to replace advice given to you by your health care provider. Make sure you discuss any questions you have with your health care  provider. Document Released: 11/21/2011 Document Revised: 11/25/2016 Document Reviewed: 11/25/2016 Elsevier Interactive Patient Education  2017 [employer].

## 2017-08-12 NOTE — Assessment & Plan Note (Signed)
I have personally reviewed the Medicare Annual Wellness questionnaire and have noted 1. The patient's medical and social history 2. Their use of alcohol, tobacco or illicit drugs 3. Their current medications and supplements 4. The patient's functional ability including ADL's, fall risks, home safety risks and hearing or visual             impairment. 5. Diet and physical activities 6. Evidence for depression or mood disorders  The patients weight, height, BMI and visual acuity have been recorded in the chart I have made referrals, counseling and provided education to the patient based review of the above and I have provided the pt with a written personalized care plan for preventive services.  I have provided you with a copy of your personalized plan for preventive services. Please take the time to review along with your updated medication list.  Will update pneumovax today Mammogram due next year Will do FIT again this year Discussed fitness and DASH eating

## 2017-08-12 NOTE — Addendum Note (Signed)
Addended by: Eual Fines on: 08/12/2017 09:00 AM   Modules accepted: Orders

## 2017-08-12 NOTE — Assessment & Plan Note (Signed)
DASH info given If mood better--hopefully will get back on bike, etc

## 2017-08-12 NOTE — Assessment & Plan Note (Signed)
Asked her to try glucosamine/chondroitin Will eventually need TKR

## 2017-08-12 NOTE — Progress Notes (Signed)
Subjective:    Patient ID: Autumn Price, female    DOB: 11/11/1941, 76 y.o.   MRN: [id number]  HPI Here for Medicare wellness visit and follow up of chronic health conditions Reviewed form and advanced directives Reviewed list of other doctors No alcohol or tobacco Tries to ride bike regularly  Having crying spells again Grandson killed himself in 04-14-2023 DIL died in June----tough on her, she was like a daughter Does okay at some times--- but "I just don't want to go no more or do nothing" Not sleeping well No suicidal ideation Feels she needs something to help her through this [place] 2 weeks ago at [place]--fell forward, but didn't hurt herself Having mood problems--see below Vision is fine Hearing is fine Independent with instrumental ADLs No memory problems  Has CD holder that fell over on right medial ankle last week Next day--extensive bruising Not really painful Mild edema at times--not bad  No chest pain  No SOB No dizziness or syncope No headaches  Has been advised to have bilateral TKR She is holding off Got the synvisc (or comparable) and it helped---but insurance denying Still gets cortisone shots at times Uses the tramadol most days--mostly in AM. Tries tylenol later in day  Current Outpatient Prescriptions on File Prior to Visit  Medication Sig Dispense Refill  . acetaminophen (TYLENOL) 500 MG tablet Take 500 mg by mouth every 6 (six) hours as needed.      Marland Kitchen amLODipine (NORVASC) 5 MG tablet Take 1 tablet (5 mg total) by mouth daily. 30 tablet 11  . Cyanocobalamin (B-12) 2500 MCG SUBL Place 1 tablet under the tongue daily.    Marland Kitchen ketoconazole (NIZORAL) 2 % cream Apply 1 application topically 2 (two) times daily. 60 g 1  . meloxicam (MOBIC) 15 MG tablet Take 1 tablet (15 mg total) by mouth daily. As needed for arthritis pain 30 tablet 11  . Multiple Vitamins-Minerals (CENTRUM SILVER ULTRA WOMENS) TABS Take 1 tablet by mouth daily.    . traMADol (ULTRAM) 50  MG tablet Take 1-2 tablets (50-100 mg total) by mouth 2 (two) times daily as needed. 120 tablet 0   No current facility-administered medications on file prior to visit.     No Known Allergies  Past Medical History:  Diagnosis Date  . Hypertension   . Osteoarthritis   . Urge incontinence   . Venous insufficiency     Past Surgical History:  Procedure Laterality Date  . APPENDECTOMY  1966  . DILATION AND CURETTAGE OF UTERUS  01/27/14   benign  . TUBAL LIGATION  1966    Family History  Problem Relation Age of Onset  . Heart disease Father        died age 4- MI  . Alcohol abuse Brother   . Heart disease Brother   . Heart disease Sister   . Breast cancer Neg Hx     Social History   Social History  . Marital status: Divorced    Spouse name: N/A  . Number of children: 3  . Years of education: N/A   Occupational History  . Sock folder---retired     [employer]   Social History Main Topics  . Smoking status: Never Smoker  . Smokeless tobacco: Never Used  . Alcohol use No  . Drug use: No  . Sexual activity: Not on file   Other Topics Concern  . Not on file   Social History Narrative   Has living will  Designated son Ronny Flurry to make health care decisions for her   Would accept CPR/resuscitation   Hasn't considered tube feedings--would leave to son   Review of Systems  Appetite is okay Trying Slim Fast---lost some weight---then right back on it Easy bruising--no other worrisome skin lesions. No dermatologist Occasional diarrhea. No blood.  No stomach symptoms on the meloxicam. Takes on full stomach Not sleeping well now Wears seat belt Teeth are okay. Keeps up with dentist. No heartburn or dysphagia Voids okay    Objective:   Physical Exam  Constitutional: She is oriented to person, place, and time. She appears well-nourished. No distress.  HENT:  Mouth/Throat: Oropharynx is clear and moist. No oropharyngeal exudate.  Neck: No  thyromegaly present.  Cardiovascular: Normal rate, regular rhythm, normal heart sounds and intact distal pulses.  Exam reveals no gallop.   No murmur heard. Pulmonary/Chest: Effort normal and breath sounds normal. No respiratory distress. She has no wheezes. She has no rales.  Abdominal: Soft. There is no tenderness.  Musculoskeletal:  Slight edema in ankles  Lymphadenopathy:    She has no cervical adenopathy.  Neurological: She is alert and oriented to person, place, and time.  President--- "Garnet Koyanagi, Obama, Bush" [phone]- "I can't go backwards" D-l-r-o-w Recall 3/3  Skin: No rash noted. No erythema.  Extensive ecchymoses in right foot/ankle but no tenderness and full ROM  Psychiatric:  Somewhat tearful Appropriate affect          Assessment & Plan:

## 2017-08-12 NOTE — Assessment & Plan Note (Signed)
Complicated grieving Discussed options--- will start medication Follow up 1 month

## 2017-08-12 NOTE — Assessment & Plan Note (Signed)
BP Readings from Last 3 Encounters:  08/12/17 134/70  08/30/16 128/64  08/09/16 132/72   Good control Will check labs--- especially renal with meloxicam

## 2017-08-12 NOTE — Assessment & Plan Note (Signed)
See social history 

## 2017-08-13 ENCOUNTER — Telehealth: Payer: Self-pay | Admitting: Internal Medicine

## 2017-08-13 NOTE — Telephone Encounter (Signed)
Check with her tomorrow to be sure it is improving

## 2017-08-13 NOTE — Telephone Encounter (Signed)
PLEASE NOTE: All timestamps contained within this report are represented as [place] Standard Time. CONFIDENTIALTY NOTICE: This fax transmission is intended only for the addressee. It contains information that is legally privileged, confidential or otherwise protected from use or disclosure. If you are not the intended recipient, you are strictly prohibited from reviewing, disclosing, copying using or disseminating any of this information or taking any action in reliance on or regarding this information. If you have received this fax in error, please notify us immediately by telephone so that we can arrange for its return to us. Phone: [phone], Toll-Free: [phone], Fax: [phone] Page: 1 of 1 Call Id: [id number] [facility] - Client TELEPHONE ADVICE RECORD [facility] Patient Name: Edmonia CaprioWANDA Lemonds Gender: Female DOB: [birth year] Age: [age] Y 4 D Return Phone Number: [phone] (Primary) City/State/Zip: [place] [id number] Client [facility] - [facility] Day Physician Tillman AbideLetvak, Richard - MD Who Is Calling Patient / Member / Family / Caregiver Call Type Triage / Clinical Relationship To Patient Self Return Phone Number [phone] (Primary) Chief Complaint Arm Pain (no known cause) Reason for Call Symptomatic / Request for Health Information Initial Comment Caller was in the office yesterday had shot, feels like she has a knot in arm and arm pain Appointment Disposition EMR Appointment Not Necessary Info pasted into Epic Yes Nurse Assessment Nurse: Odis LusterBowers, RN, Bjorn Loserhonda Date/Time (Eastern Time): 08/13/2017 11:37:48 AM Confirm and document reason for call. If symptomatic, describe symptoms. ---Caller was in the office yesterday had shot, feels like she has a knot in arm and arm pain. Reports that she had a pneumonia shot. This time her arm is hurting and has a red  knot. Does the PT have any chronic conditions? (i.e. diabetes, asthma, etc.) ---Yes List chronic conditions. ---HTN; knee pain (arthritis); depression Guidelines Guideline Title Affirmed Question Immunization Reactions Injection site reaction to any vaccine Disp. Time [name](Eastern Time) Disposition Final User 08/13/2017 11:41:43 AM Home Care Yes Odis LusterBowers, RN, [facility] Advice Given Per Guideline HOME CARE: You should be able to treat this at home. COLD PACK FOR LOCAL REACTION AT INJECTION SITE: * Apply a cold pack or ice in a wet washcloth to the area for 20 minutes. Repeat in 1 hour. * Then apply as needed for the first 48 hours after the injection. (Reason: reduce the pain and swelling.) PAIN MEDICINES: * For pain relief, take acetaminophen, ibuprofen, or naproxen. * Use the lowest amount that makes your pain feel better. ACETAMINOPHEN (E.G., TYLENOL): * Another choice is to take 1,000 mg (two 500 mg pills) every 8 hours as needed. Each Extra Strength Tylenol pill has 500 mg of acetaminophen. The most you should take each day is 3,000 mg (6 Extra Strength pills a day). * Take 650 mg (two 325 mg pills) by mouth every 4-6 hours as needed. Each Regular Strength Tylenol pill has 325 mg of acetaminophen. The most you should take each day is 3,250 mg (10 Regular Strength pills a day). CALL BACK IF: * Fever lasts over 3 days * Pain lasts over 3 days * Redness or swelling lasts over 3 days * You become worse. CARE ADVICE given per Immunization Reactions (Adult) guideline.

## 2017-08-13 NOTE — Telephone Encounter (Signed)
[facility] Client TELEPHONE ADVICE RECORD [facility]  Patient Name: Autumn CaprioWANDA Devincent  DOB: 11-15-1941    Initial Comment Caller was in the office yesterday had shot, feels like she has a knot in arm and arm pain    Nurse Assessment  Nurse: Odis LusterBowers, RN, Bjorn Loserhonda Date/Time (Eastern Time): 08/13/2017 11:37:48 AM  Confirm and document reason for call. If symptomatic, describe symptoms. ---Caller was in the office yesterday had shot, feels like she has a knot in arm and arm pain. Reports that she had a pneumonia shot. This time her arm is hurting and has a red knot.  Does the patient have any new or worsening symptoms? ---Yes  Will a triage be completed? ---Yes  Related visit to physician within the last 2 weeks? ---Yes  Does the PT have any chronic conditions? (i.e. diabetes, asthma, etc.) ---Yes  List chronic conditions. ---HTN; knee pain (arthritis); depression  Is this a behavioral health or substance abuse call? ---No     Guidelines    Guideline Title Affirmed Question Affirmed Notes  Immunization Reactions Injection site reaction to any vaccine    Final Disposition User   Home Care Odis LusterBowers, RN, Bjorn Loserhonda    Disagree/Comply: Danella Maiersomply

## 2017-08-14 NOTE — Telephone Encounter (Signed)
Spoke to pt. She said the knot is not as big and the redness has subsided.

## 2017-08-20 ENCOUNTER — Other Ambulatory Visit (INDEPENDENT_AMBULATORY_CARE_PROVIDER_SITE_OTHER): Payer: Medicare Other

## 2017-08-20 DIAGNOSIS — Z1211 Encounter for screening for malignant neoplasm of colon: Secondary | ICD-10-CM

## 2017-08-20 LAB — FECAL OCCULT BLOOD, IMMUNOCHEMICAL: Fecal Occult Bld: NEGATIVE

## 2017-09-12 ENCOUNTER — Encounter: Payer: Self-pay | Admitting: Internal Medicine

## 2017-09-12 ENCOUNTER — Ambulatory Visit (INDEPENDENT_AMBULATORY_CARE_PROVIDER_SITE_OTHER): Payer: Medicare Other | Admitting: Internal Medicine

## 2017-09-12 VITALS — BP 130/70 | HR 71 | Temp 97.9°F | Wt 249.0 lb

## 2017-09-12 DIAGNOSIS — Z23 Encounter for immunization: Secondary | ICD-10-CM | POA: Diagnosis not present

## 2017-09-12 DIAGNOSIS — F39 Unspecified mood [affective] disorder: Secondary | ICD-10-CM

## 2017-09-12 MED ORDER — TRAMADOL HCL 50 MG PO TABS: 50.0000 mg | ORAL_TABLET | Freq: Two times a day (BID) | ORAL | 0 refills | Status: DC | PRN

## 2017-09-12 NOTE — Progress Notes (Signed)
   Subjective:    Patient ID: Autumn Price, female    DOB: May 14, 1941, 76 y.o.   MRN: [id number]  HPI Here for follow up of mood problems Feels better since starting the medication  Sleeps fair-- 4-5 hours a night, no change Still no energy--"I just don't want to do anything" Sadness is not as bad though Appetite is fine still  Current Outpatient Prescriptions on File Prior to Visit  Medication Sig Dispense Refill  . acetaminophen (TYLENOL) 500 MG tablet Take 500 mg by mouth every 6 (six) hours as needed.      Marland Kitchen amLODipine (NORVASC) 5 MG tablet Take 1 tablet (5 mg total) by mouth daily. 30 tablet 11  . Cyanocobalamin (B-12) 2500 MCG SUBL Place 1 tablet under the tongue daily.    . diclofenac sodium (VOLTAREN) 1 % GEL Apply 1 application topically 2 (two) times daily.    Marland Kitchen ketoconazole (NIZORAL) 2 % cream Apply 1 application topically 2 (two) times daily. 60 g 1  . meloxicam (MOBIC) 15 MG tablet Take 1 tablet (15 mg total) by mouth daily. As needed for arthritis pain 30 tablet 11  . Multiple Vitamins-Minerals (CENTRUM SILVER ULTRA WOMENS) TABS Take 1 tablet by mouth daily.    . sertraline (ZOLOFT) 50 MG tablet Take 1 tablet (50 mg total) by mouth daily. 30 tablet 3  . traMADol (ULTRAM) 50 MG tablet Take 1-2 tablets (50-100 mg total) by mouth 2 (two) times daily as needed. 120 tablet 0   No current facility-administered medications on file prior to visit.     No Known Allergies  Past Medical History:  Diagnosis Date  . Hypertension   . Osteoarthritis   . Urge incontinence   . Venous insufficiency     Past Surgical History:  Procedure Laterality Date  . APPENDECTOMY  1966  . DILATION AND CURETTAGE OF UTERUS  01/27/14   benign  . TUBAL LIGATION  1966    Family History  Problem Relation Age of Onset  . Heart disease Father        died age 6- MI  . Alcohol abuse Brother   . Heart disease Brother   . Heart disease Sister   . Breast cancer Neg Hx     Social History    Social History  . Marital status: Divorced    Spouse name: N/A  . Number of children: 3  . Years of education: N/A   Occupational History  . [employer]---retired     [employer]   Social History Main Topics  . Smoking status: Never Smoker  . Smokeless tobacco: Never Used  . Alcohol use No  . Drug use: No  . Sexual activity: Not on file   Other Topics Concern  . Not on file   Social History Narrative   Has living will   Designated son Ronny Flurry to make health care decisions for her   Would accept CPR/resuscitation   Hasn't considered tube feedings--would leave to son   Review of Systems Weight stable--- tried slim fast and actually gained weight on it Still uses the tramadol regularly for pain    Objective:   Physical Exam  Constitutional: She appears well-nourished. No distress.  Psychiatric: She has a normal mood and affect. Her behavior is normal.          Assessment & Plan:

## 2017-09-12 NOTE — Addendum Note (Signed)
Addended by: Eual Fines on: 09/12/2017 01:22 PM   Modules accepted: Orders

## 2017-09-12 NOTE — Patient Instructions (Addendum)
Please let me know if the medicine seems to stop working for the depression. Don't stop the medication until your follow up here in 6 months.

## 2017-09-12 NOTE — Assessment & Plan Note (Signed)
Mostly complicated grieving Doing better now--will continue the current dose Discussed tachyphylaxis Will recheck at 6 months--consider wean then if doing well Counseled on not abruptly stopping--withdrawal possibility

## 2017-10-14 ENCOUNTER — Other Ambulatory Visit: Payer: Self-pay

## 2017-10-14 NOTE — Telephone Encounter (Signed)
Last filled 09-12-17 #120 last OV 08-12-17 Next OV 03-13-18

## 2017-10-15 MED ORDER — TRAMADOL HCL 50 MG PO TABS: 50.0000 mg | ORAL_TABLET | Freq: Two times a day (BID) | ORAL | 0 refills | Status: DC | PRN

## 2017-10-15 NOTE — Telephone Encounter (Signed)
Verbal refill given to Autumn Price at the pharmacy

## 2017-10-15 NOTE — Telephone Encounter (Signed)
Approved: #120 x 0 

## 2017-10-27 ENCOUNTER — Ambulatory Visit (HOSPITAL_COMMUNITY)
Admission: RE | Admit: 2017-10-27 | Discharge: 2017-10-27 | Disposition: A | Discharge status: Home / Self Care | Payer: Medicare Other | Source: Ambulatory Visit | Attending: Cardiology | Admitting: Cardiology

## 2017-10-27 ENCOUNTER — Other Ambulatory Visit (HOSPITAL_COMMUNITY): Payer: Self-pay | Admitting: Specialist

## 2017-10-27 DIAGNOSIS — M79661 Pain in right lower leg: Secondary | ICD-10-CM | POA: Diagnosis not present

## 2017-10-27 DIAGNOSIS — M170 Bilateral primary osteoarthritis of knee: Secondary | ICD-10-CM | POA: Diagnosis not present

## 2017-11-17 ENCOUNTER — Other Ambulatory Visit: Payer: Self-pay

## 2017-11-17 MED ORDER — TRAMADOL HCL 50 MG PO TABS: 50.0000 mg | ORAL_TABLET | Freq: Two times a day (BID) | ORAL | 0 refills | Status: DC | PRN

## 2017-11-17 NOTE — Telephone Encounter (Signed)
Last filled 10-15-17 #120 Last OV 08-12-17 Next OV 03-13-18

## 2017-11-17 NOTE — Telephone Encounter (Signed)
Verbal refill given to [name] at the pharmacy

## 2017-11-17 NOTE — Telephone Encounter (Signed)
Approved: #120 x 0 

## 2017-12-15 ENCOUNTER — Other Ambulatory Visit: Payer: Self-pay

## 2017-12-15 MED ORDER — SERTRALINE HCL 50 MG PO TABS: 50.0000 mg | ORAL_TABLET | Freq: Every day | ORAL | 11 refills | Status: DC

## 2017-12-15 NOTE — Telephone Encounter (Signed)
Rx sent electronically.  

## 2017-12-19 ENCOUNTER — Other Ambulatory Visit: Payer: Self-pay | Admitting: *Deleted

## 2017-12-19 MED ORDER — TRAMADOL HCL 50 MG PO TABS: 50.0000 mg | ORAL_TABLET | Freq: Two times a day (BID) | ORAL | 0 refills | Status: DC | PRN

## 2017-12-19 NOTE — Telephone Encounter (Signed)
Received faxed refill request from pharmacy. Refill was on print and not sent to the pharmacy. Refilled called in per previous refill note.

## 2017-12-19 NOTE — Telephone Encounter (Signed)
Medication phoned to pharmacy.  

## 2017-12-19 NOTE — Telephone Encounter (Signed)
Faxed refill request for Tramadol Last refill 11/17/17 #120 Last office visit 09/12/17

## 2017-12-19 NOTE — Telephone Encounter (Signed)
Px written for call in   Refilled once in pcp absence 

## 2017-12-22 DIAGNOSIS — M170 Bilateral primary osteoarthritis of knee: Secondary | ICD-10-CM | POA: Diagnosis not present

## 2018-01-19 ENCOUNTER — Other Ambulatory Visit: Payer: Self-pay

## 2018-01-19 NOTE — Telephone Encounter (Signed)
Last filled 12-19-17 #120 Last OV 09-12-17 Next OV 03-13-18

## 2018-01-20 MED ORDER — TRAMADOL HCL 50 MG PO TABS
50.0000 mg | ORAL_TABLET | Freq: Two times a day (BID) | ORAL | 0 refills | Status: DC | PRN
Start: 2018-01-20 — End: 2018-02-20

## 2018-01-21 DIAGNOSIS — M1711 Unilateral primary osteoarthritis, right knee: Secondary | ICD-10-CM | POA: Diagnosis not present

## 2018-01-23 ENCOUNTER — Ambulatory Visit (INDEPENDENT_AMBULATORY_CARE_PROVIDER_SITE_OTHER): Payer: Medicare Other | Admitting: Family Medicine

## 2018-01-23 ENCOUNTER — Encounter: Payer: Self-pay | Admitting: Family Medicine

## 2018-01-23 VITALS — BP 124/72 | HR 72 | Temp 98.0°F | Wt 246.5 lb

## 2018-01-23 DIAGNOSIS — F39 Unspecified mood [affective] disorder: Secondary | ICD-10-CM

## 2018-01-23 DIAGNOSIS — R238 Other skin changes: Secondary | ICD-10-CM

## 2018-01-23 DIAGNOSIS — R233 Spontaneous ecchymoses: Secondary | ICD-10-CM

## 2018-01-23 LAB — COMPREHENSIVE METABOLIC PANEL
ALT: 12 U/L (ref 0–35)
AST: 14 U/L (ref 0–37)
Albumin: 4.1 g/dL (ref 3.5–5.2)
Alkaline Phosphatase: 79 U/L (ref 39–117)
BUN: 15 mg/dL (ref 6–23)
CO2: 33 mEq/L — ABNORMAL HIGH (ref 19–32)
Calcium: 9.7 mg/dL (ref 8.4–10.5)
Chloride: 103 mEq/L (ref 96–112)
Creatinine, Ser: 0.78 mg/dL (ref 0.40–1.20)
GFR: 76.64 mL/min (ref >60.00)
Glucose, Bld: 99 mg/dL (ref 70–99)
Potassium: 4.1 mEq/L (ref 3.5–5.1)
Sodium: 141 mEq/L (ref 135–145)
Total Bilirubin: 0.7 mg/dL (ref 0.2–1.2)
Total Protein: 7.3 g/dL (ref 6.0–8.3)

## 2018-01-23 LAB — CBC WITH DIFFERENTIAL/PLATELET
Basophils Absolute: 29 cells/uL (ref 0–200)
Basophils Relative: 0.7 %
Eosinophils Absolute: 88 cells/uL (ref 15–500)
Eosinophils Relative: 2.1 %
HCT: 42.5 % (ref 35.0–45.0)
Hemoglobin: 14.2 g/dL (ref 11.7–15.5)
Lymphs Abs: 1050 cells/uL (ref 850–3900)
MCH: 28.8 pg (ref 27.0–33.0)
MCHC: 33.4 g/dL (ref 32.0–36.0)
MCV: 86.2 fL (ref 80.0–100.0)
MPV: 13.6 fL — ABNORMAL HIGH (ref 7.5–12.5)
Monocytes Relative: 11.2 %
Neutro Abs: 2562 cells/uL (ref 1500–7800)
Neutrophils Relative %: 61 %
Platelets: 147 10*3/uL (ref 140–400)
RBC: 4.93 10*6/uL (ref 3.80–5.10)
RDW: 13.2 % (ref 11.0–15.0)
Total Lymphocyte: 25 %
WBC mixed population: 470 cells/uL (ref 200–950)
WBC: 4.2 10*3/uL (ref 3.8–10.8)

## 2018-01-23 LAB — SEDIMENTATION RATE: Sed Rate: 8 mm/hr (ref 0–30)

## 2018-01-23 MED ORDER — SERTRALINE HCL 50 MG PO TABS: 75.0000 mg | ORAL_TABLET | Freq: Every day | ORAL | 2 refills | Status: DC

## 2018-01-23 NOTE — Patient Instructions (Addendum)
Try taking your sertraline at bedtime incase it is contributing to your fatigue  Can also increase to 1 and 1/2 tablets daily- I have sent in a new prescription

## 2018-01-23 NOTE — Progress Notes (Signed)
   Subjective:    Patient ID: Autumn Price, female    DOB: [birth year], 77 y.o.   MRN: [id number]  HPI This is a 77 yo female who presents today with bruising/discoloration of her right lower leg. She has not had any trauma to the leg.  Was seen at orthopedic office yesterday for consultation for TKR and orthopedic doctor told her she needed evaluation. No warmth, no increased swelling, no pain. She reports that she has always bruised easily. Takes meloxicam, no additional mediations that should cause blood thinning.   Was put on 50 mg of sertraline last year following suicide of her son and death of her DIL. Feels very fatigued, has days of crying spellings. Will have some good days.    Past Medical History:  Diagnosis Date  . Hypertension   . Osteoarthritis   . Urge incontinence   . Venous insufficiency    Past Surgical History:  Procedure Laterality Date  . APPENDECTOMY  1966  . DILATION AND CURETTAGE OF UTERUS  01/27/14   benign  . TUBAL LIGATION  1966   Family History  Problem Relation Age of Onset  . Heart disease Father        died age 75- MI  . Alcohol abuse Brother   . Heart disease Brother   . Heart disease Sister   . Breast cancer Neg Hx    Social History   Tobacco Use  . Smoking status: Never Smoker  . Smokeless tobacco: Never Used  Substance Use Topics  . Alcohol use: No  . Drug use: No      Review of Systems Per HPI    Objective:   Physical Exam  Constitutional: She is oriented to person, place, and time. She appears well-developed and well-nourished. No distress.  Obese.   HENT:  Head: Normocephalic and atraumatic.  Eyes: Conjunctivae are normal.  Cardiovascular: Normal rate.  Pulmonary/Chest: Effort normal.  Neurological: She is alert and oriented to person, place, and time.  Skin: Skin is warm and dry. She is not diaphoretic.  See image below. Multiple ecchymotic areas and underling chronic hyperpigmentation. No warmth, drainage or erythema.     Psychiatric: She has a normal mood and affect. Her behavior is normal. Judgment and thought content normal.  Vitals reviewed.     BP 124/72   Pulse 72   Temp 98 F (36.7 C) (Oral)   Wt 246 lb 8 oz (111.8 kg)   SpO2 97%   BMI 43.67 kg/m  Wt Readings from Last 3 Encounters:  01/23/18 246 lb 8 oz (111.8 kg)  09/12/17 249 lb (112.9 kg)  08/12/17 248 lb 1.6 oz (112.5 kg)         Assessment & Plan:  1. Abnormal bruising - she has follow up scheduled with Dr. Alphonsus SiasLetvak for surgical clearance. She will hold on to the paperwork until she sees him later this month - CBC with Differential - Comprehensive metabolic panel - Sedimentation Rate - ANA  2. Mood disorder (HCC) - Discussed taking her sertraline at bedtime to see if fatigue will be improved - sertraline (ZOLOFT) 50 MG tablet; Take 1.5 tablets (75 mg total) by mouth daily.  Dispense: 45 tablet; Refill: 2   Olean Reeeborah Yue Glasheen, FNP-BC  [facility]  01/23/2018 10:16 AM

## 2018-01-23 NOTE — Addendum Note (Signed)
Addended by: Alvina ChouWALSH, Krystie Leiter J on: 01/23/2018 12:23 PM   Modules accepted: Orders

## 2018-01-26 LAB — ANA: Anti Nuclear Antibody(ANA): NEGATIVE

## 2018-02-05 ENCOUNTER — Encounter: Payer: Self-pay | Admitting: Internal Medicine

## 2018-02-05 ENCOUNTER — Ambulatory Visit (INDEPENDENT_AMBULATORY_CARE_PROVIDER_SITE_OTHER): Payer: Medicare Other | Admitting: Internal Medicine

## 2018-02-05 VITALS — BP 134/74 | HR 76 | Temp 97.8°F | Wt 244.8 lb

## 2018-02-05 DIAGNOSIS — M7021 Olecranon bursitis, right elbow: Secondary | ICD-10-CM | POA: Insufficient documentation

## 2018-02-05 NOTE — Progress Notes (Signed)
Subjective:    Patient ID: Autumn Price, female    DOB: [birth year], 77 y.o.   MRN: [id number]  HPI Here due to elbow pain Started 2 days ago--felt funny upon awakening  Spongy swelling Mild pain with bending  No trauma or new tasks No history of gout  No Rx  Current Outpatient Medications on File Prior to Visit  Medication Sig Dispense Refill  . acetaminophen (TYLENOL) 500 MG tablet Take 500 mg by mouth every 6 (six) hours as needed.      Marland Kitchen amLODipine (NORVASC) 5 MG tablet Take 1 tablet (5 mg total) by mouth daily. 30 tablet 11  . Cyanocobalamin (B-12) 2500 MCG SUBL Place 1 tablet under the tongue daily.    . diclofenac sodium (VOLTAREN) 1 % GEL Apply 1 application topically 2 (two) times daily.    Marland Kitchen ketoconazole (NIZORAL) 2 % cream Apply 1 application topically 2 (two) times daily. 60 g 1  . meloxicam (MOBIC) 15 MG tablet Take 1 tablet (15 mg total) by mouth daily. As needed for arthritis pain 30 tablet 11  . Multiple Vitamins-Minerals (CENTRUM SILVER ULTRA WOMENS) TABS Take 1 tablet by mouth daily.    . sertraline (ZOLOFT) 50 MG tablet Take 1.5 tablets (75 mg total) by mouth daily. 45 tablet 2  . traMADol (ULTRAM) 50 MG tablet Take 1-2 tablets (50-100 mg total) by mouth 2 (two) times daily as needed. 120 tablet 0   No current facility-administered medications on file prior to visit.     No Known Allergies  Past Medical History:  Diagnosis Date  . Hypertension   . Osteoarthritis   . Urge incontinence   . Venous insufficiency     Past Surgical History:  Procedure Laterality Date  . APPENDECTOMY  1966  . DILATION AND CURETTAGE OF UTERUS  01/27/14   benign  . TUBAL LIGATION  1966    Family History  Problem Relation Age of Onset  . Heart disease Father        died age 63- MI  . Alcohol abuse Brother   . Heart disease Brother   . Heart disease Sister   . Breast cancer Neg Hx     Social History   Socioeconomic History  . Marital status: Divorced    Spouse  name: Not on file  . Number of children: 3  . Years of education: Not on file  . Highest education level: Not on file  Social Needs  . Financial resource strain: Not on file  . Food insecurity - worry: Not on file  . Food insecurity - inability: Not on file  . Transportation needs - medical: Not on file  . Transportation needs - non-medical: Not on file  Occupational History  . 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 Smoking status: Never Smoker  . Smokeless tobacco: Never Used  Substance and Sexual Activity  . Alcohol use: No  . Drug use: No  . Sexual activity: Not on file  Other Topics Concern  . Not on file  Social History Narrative   Has living will   Designated son Ronny Flurry to make health care decisions for her   Would accept CPR/resuscitation   Hasn't considered tube feedings--would leave to son   Review of Systems No fever Doesn't feel sick    Objective:   Physical Exam  Musculoskeletal:  Distinct swelling of right olecranon bursa. Slightly warm but not red or tender Normal  ROM in elbow---supination/pronation, flexion and extension          Assessment & Plan:

## 2018-02-05 NOTE — Assessment & Plan Note (Signed)
Not infected Discussed compression for comfort and protection Ice prn Reviewed signs of infection

## 2018-02-05 NOTE — Patient Instructions (Signed)
You may feel better keeping compression on your right elbow (like with a brace or ACE bandage). If it has more pain, try icing it for about 10-15 minutes.

## 2018-02-11 ENCOUNTER — Ambulatory Visit (INDEPENDENT_AMBULATORY_CARE_PROVIDER_SITE_OTHER): Payer: Medicare Other | Admitting: Internal Medicine

## 2018-02-11 ENCOUNTER — Encounter: Payer: Self-pay | Admitting: Internal Medicine

## 2018-02-11 VITALS — BP 134/72 | HR 72 | Temp 97.5°F | Wt 248.0 lb

## 2018-02-11 DIAGNOSIS — F39 Unspecified mood [affective] disorder: Secondary | ICD-10-CM

## 2018-02-11 DIAGNOSIS — Z6841 Body Mass Index (BMI) 40.0 and over, adult: Secondary | ICD-10-CM

## 2018-02-11 DIAGNOSIS — Z01818 Encounter for other preprocedural examination: Secondary | ICD-10-CM | POA: Diagnosis not present

## 2018-02-11 NOTE — Assessment & Plan Note (Signed)
This will complicate her recovery

## 2018-02-11 NOTE — Assessment & Plan Note (Signed)
Low risk from cardiovascular standpoint EKG is sinus at 70. Normal axis and intervals. No ischemic changes. No change from 01/20/14 May have prolonged rehab course due to deconditioning and obesity Hopes to go home after surgery--discussed needs someone with her for a while (plans sister in law) Should have aggressive DVT prophylaxis (ie--xarelto)  Okay to proceed

## 2018-02-11 NOTE — Progress Notes (Signed)
Subjective:    Patient ID: Autumn Price, female    DOB: November 29, 1941, 77 y.o.   MRN: [id number]  HPI Here for medical evaluation prior to right TKR  Having increasing severe pain in knee---even just walking in house Dr Thomasena Edis will be doing the surgery--- [facility] to go home after surgery for outpatient rehab  Has felt well No cough or fever No chest pain No SOB No palpitations Some swelling in right ankle/calf No dizziness or syncope  Sertraline has helped her mood She is doing better in general  Current Outpatient Medications on File Prior to Visit  Medication Sig Dispense Refill  . acetaminophen (TYLENOL) 500 MG tablet Take 500 mg by mouth every 6 (six) hours as needed.      Marland Kitchen amLODipine (NORVASC) 5 MG tablet Take 1 tablet (5 mg total) by mouth daily. 30 tablet 11  . Cyanocobalamin (B-12) 2500 MCG SUBL Place 1 tablet under the tongue daily.    . diclofenac sodium (VOLTAREN) 1 % GEL Apply 1 application topically 2 (two) times daily.    Marland Kitchen ketoconazole (NIZORAL) 2 % cream Apply 1 application topically 2 (two) times daily. 60 g 1  . meloxicam (MOBIC) 15 MG tablet Take 1 tablet (15 mg total) by mouth daily. As needed for arthritis pain 30 tablet 11  . Multiple Vitamins-Minerals (CENTRUM SILVER ULTRA WOMENS) TABS Take 1 tablet by mouth daily.    . sertraline (ZOLOFT) 50 MG tablet Take 1.5 tablets (75 mg total) by mouth daily. 45 tablet 2  . traMADol (ULTRAM) 50 MG tablet Take 1-2 tablets (50-100 mg total) by mouth 2 (two) times daily as needed. 120 tablet 0   No current facility-administered medications on file prior to visit.     No Known Allergies  Past Medical History:  Diagnosis Date  . Hypertension   . Osteoarthritis   . Urge incontinence   . Venous insufficiency     Past Surgical History:  Procedure Laterality Date  . APPENDECTOMY  1966  . DILATION AND CURETTAGE OF UTERUS  01/27/14   benign  . TUBAL LIGATION  1966    Family History  Problem  Relation Age of Onset  . Heart disease Father        died age 32- MI  . Alcohol abuse Brother   . Heart disease Brother   . Heart disease Sister   . Breast cancer Neg Hx     Social History   Socioeconomic History  . Marital status: Divorced    Spouse name: Not on file  . Number of children: 3  . Years of education: Not on file  . Highest education level: Not on file  Social Needs  . Financial resource strain: Not on file  . Food insecurity - worry: Not on file  . Food insecurity - inability: Not on file  . Transportation needs - medical: Not on file  . Transportation needs - non-medical: Not on file  Occupational History  . 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 Smoking status: Never Smoker  . Smokeless tobacco: Never Used  Substance and Sexual Activity  . Alcohol use: No  . Drug use: No  . Sexual activity: Not on file  Other Topics Concern  . Not on file  Social History Narrative   Has living will   Designated son Ronny Flurry to make health care decisions for her   Would accept CPR/resuscitation  Hasn't considered tube feedings--would leave to son   Review of Systems Easy bruising Sleeps well Appetite is fine     Objective:   Physical Exam  Constitutional: She appears well-developed. No distress.  Neck: No thyromegaly present.  Cardiovascular: Normal rate, regular rhythm and normal heart sounds. Exam reveals no gallop.  No murmur heard. Pulmonary/Chest: Effort normal and breath sounds normal. No respiratory distress. She has no wheezes. She has no rales.  Abdominal: There is no tenderness.  Musculoskeletal:  Thick calves without pitting or tenderness  Lymphadenopathy:    She has no cervical adenopathy.  Psychiatric: She has a normal mood and affect. Her behavior is normal.          Assessment & Plan:

## 2018-02-11 NOTE — Assessment & Plan Note (Signed)
Doing well with mood Okay to proceed with surgery May do better after less knee pain and better mobility

## 2018-02-16 NOTE — Progress Notes (Signed)
Please place orders in Epic as patient is being scheduled for a pre-op appointment! Thank you! 

## 2018-02-17 ENCOUNTER — Ambulatory Visit: Payer: Self-pay | Admitting: Orthopedic Surgery

## 2018-02-20 ENCOUNTER — Other Ambulatory Visit: Payer: Self-pay

## 2018-02-20 MED ORDER — TRAMADOL HCL 50 MG PO TABS: 50.0000 mg | ORAL_TABLET | Freq: Two times a day (BID) | ORAL | 0 refills | Status: DC | PRN

## 2018-02-20 NOTE — Telephone Encounter (Signed)
Last filled 01-20-18 #120 Last OV 02-11-18 Next OV 08-14-18  Forwarding to Dr Ermalene SearingBedsole in Dr Karle StarchLetvak's absence

## 2018-03-02 NOTE — H&P (Signed)
TOTAL KNEE ADMISSION H&P  Patient is being admitted for right total knee arthroplasty.  Subjective:  Chief Complaint:right knee pain.  HPI: Autumn Price, 77 y.o. female, has a history of pain and functional disability in the right knee due to arthritis and has failed non-surgical conservative treatments for greater than 12 weeks to includecorticosteriod injections, viscosupplementation injections, flexibility and strengthening excercises, use of assistive devices and weight reduction as appropriate.  Onset of symptoms was gradual, starting 3 years ago with gradually worsening course since that time. The patient noted no past surgery on the right knee(s).  Patient currently rates pain in the right knee(s) at 8 out of 10 with activity. Patient has worsening of pain with activity and weight bearing, pain with passive range of motion and crepitus.  Patient has evidence of subchondral sclerosis, periarticular osteophytes and joint space narrowing by imaging studies.  There is no active infection. Anticipated LOS equal to or greater than 2 midnights due to - Age 75 and older with one or more of the following:  - Obesity  - Expected need for hospital services (PT, OT, Nursing) required for safe  discharge  - Anticipated need for postoperative skilled nursing care or inpatient rehab     Patient Active Problem List   Diagnosis Date Noted  . Pre-op evaluation 02/11/2018  . Osteoarthritis of both knees 08/30/2016  . Advance directive discussed with patient 08/04/2015  . Mood disorder (HCC) 06/10/2014  . BMI 40.0-44.9, adult (HCC) 05/22/2012  . Routine general medical examination at a health care facility 03/19/2011  . Urge incontinence 03/19/2011  . Essential hypertension, benign 08/21/2010  . Chronic venous insufficiency 08/21/2010  . Osteoarthritis, multiple sites 08/21/2010   Past Medical History:  Diagnosis Date  . Hypertension   . Osteoarthritis   . Urge incontinence   . Venous  insufficiency     Past Surgical History:  Procedure Laterality Date  . APPENDECTOMY  1966  . DILATION AND CURETTAGE OF UTERUS  01/27/14   benign  . TUBAL LIGATION  1966    No current facility-administered medications for this encounter.    Current Outpatient Medications  Medication Sig Dispense Refill Last Dose  . acetaminophen (TYLENOL) 500 MG tablet Take 500 mg by mouth every 6 (six) hours as needed.     Taking  . amLODipine (NORVASC) 5 MG tablet Take 1 tablet (5 mg total) by mouth daily. 30 tablet 11 Taking  . Cyanocobalamin (B-12) 2500 MCG SUBL Place 1 tablet under the tongue daily.   Taking  . diclofenac sodium (VOLTAREN) 1 % GEL Apply 1 application topically 2 (two) times daily.   Taking  . ketoconazole (NIZORAL) 2 % cream Apply 1 application topically 2 (two) times daily. 60 g 1 Taking  . meloxicam (MOBIC) 15 MG tablet Take 1 tablet (15 mg total) by mouth daily. As needed for arthritis pain 30 tablet 11 Taking  . Multiple Vitamins-Minerals (CENTRUM SILVER ULTRA WOMENS) TABS Take 1 tablet by mouth daily.   Taking  . sertraline (ZOLOFT) 50 MG tablet Take 1.5 tablets (75 mg total) by mouth daily. 45 tablet 2 Taking  . traMADol (ULTRAM) 50 MG tablet Take 1-2 tablets (50-100 mg total) by mouth 2 (two) times daily as needed. 120 tablet 0    No Known Allergies  Social History   Tobacco Use  . Smoking status: Never Smoker  . Smokeless tobacco: Never Used  Substance Use Topics  . Alcohol use: No    Family History  Problem Relation  Age of Onset  . Heart disease Father        died age 64- MI  . Alcohol abuse Brother   . Heart disease Brother   . Heart disease Sister   . Breast cancer Neg Hx      Review of Systems  Constitutional: Negative.   HENT: Negative.   Eyes: Negative.   Respiratory: Negative.   Cardiovascular: Negative.   Gastrointestinal: Negative.   Genitourinary: Negative.   Musculoskeletal: Positive for joint pain.  Skin: Negative.   Neurological: Negative.    Endo/Heme/Allergies: Negative.   Psychiatric/Behavioral: Negative.     Objective:  Physical Exam  Constitutional: She is oriented to person, place, and time. She appears well-developed.  HENT:  Head: Normocephalic.  Eyes: EOM are normal.  Neck: Normal range of motion.  Cardiovascular: Normal rate and intact distal pulses.  GI: Soft.  Genitourinary:  Genitourinary Comments: deferred  Musculoskeletal:  Limited ROM. Knee is stable. LE grossly n/v intact.  Neurological: She is alert and oriented to person, place, and time.  Skin: Skin is warm and dry.  Psychiatric: Her behavior is normal.    Vital signs in last 24 hours: BP: ()/()  Arterial Line BP: ()/()   Labs:   Estimated body mass index is 43.93 kg/m as calculated from the following:   Height as of 08/12/17: 5\' 3"  (1.6 m).   Weight as of 02/11/18: 112.5 kg (248 lb).   Imaging Review Plain radiographs demonstrate severe degenerative joint disease of the right knee(s). The overall alignment ismild varus. The bone quality appears to be good for age and reported activity level.   Preoperative templating of the joint replacement has been completed, documented, and submitted to the Operating Room personnel in order to optimize intra-operative equipment management.       Assessment/Plan:  End stage arthritis, right knee   The patient history, physical examination, clinical judgment of the provider and imaging studies are consistent with end stage degenerative joint disease of the right knee(s) and total knee arthroplasty is deemed medically necessary. The treatment options including medical management, injection therapy arthroscopy and arthroplasty were discussed at length. The risks and benefits of total knee arthroplasty were presented and reviewed. The risks due to aseptic loosening, infection, stiffness, patella tracking problems, thromboembolic complications and other imponderables were discussed. The patient  acknowledged the explanation, agreed to proceed with the plan and consent was signed. Patient is being admitted for inpatient treatment for surgery, pain control, PT, OT, prophylactic antibiotics, VTE prophylaxis, progressive ambulation and ADL's and discharge planning. The patient is planning to be discharged home with home health services   Will use IV tranexamic acid. Contraindications and adverse affects of Tranexamic acid discussed in detail. Patient denies any of these at this time and understands the risks and benefits.

## 2018-03-12 NOTE — Progress Notes (Signed)
02-11-18 (Epic) EKG and Surgical Clearance from Dr. Alphonsus SiasLetvak

## 2018-03-12 NOTE — Patient Instructions (Signed)
Autumn Price  03/12/2018   Your procedure is scheduled on: 03-20-18   Report to [facility] Main  Entrance Report to Admitting at 10:45 AM   Call this number if you have problems the morning of surgery [phone]   Remember: Do not eat food or drink liquids :After Midnight.You may have a Clear Liquid Diet from Midnight until 7:15 AM. After 7:15 AM, nothing until after surgery.     CLEAR LIQUID DIET   Foods Allowed                                                                     Foods Excluded  Coffee and tea, regular and decaf                             liquids that you cannot  Plain Jell-O in any flavor                                             see through such as: Fruit ices (not with fruit pulp)                                     milk, soups, orange juice  Iced Popsicles                                    All solid food Carbonated beverages, regular and diet                                    Cranberry, grape and apple juices Sports drinks like Gatorade Lightly seasoned clear broth or consume(fat free) Sugar, honey syrup  Sample Menu Breakfast                                Lunch                                     Supper Cranberry juice                    Beef broth                            Chicken broth Jell-O                                     Grape juice                           Apple juice Coffee  or tea                        Jell-O                                      Popsicle                                                Coffee or tea                        Coffee or tea  _____________________________________________________________________     Take these medicines the morning of surgery with A SIP OF WATER: Amlodipine (Norvasc) and Sertraline (Zoloft)                                You may not have any metal on your body including hair pins and              piercings  Do not wear jewelry, make-up, lotions, powders or perfumes,  deodorant             Do not wear nail polish.  Do not shave  48 hours prior to surgery.                Do not bring valuables to the hospital. Autumn Price IS NOT             RESPONSIBLE   FOR VALUABLES.  Contacts, dentures or bridgework may not be worn into surgery.  Leave suitcase in the car. After surgery it may be brought to your room.                 Please read over the following fact sheets you were given: _____________________________________________________________________          [facility] - Preparing for Surgery Before surgery, you can play an important role.  Because skin is not sterile, your skin needs to be as free of germs as possible.  You can reduce the number of germs on your skin by washing with CHG (chlorahexidine gluconate) soap before surgery.  CHG is an antiseptic cleaner which kills germs and bonds with the skin to continue killing germs even after washing. Please DO NOT use if you have an allergy to CHG or antibacterial soaps.  If your skin becomes reddened/irritated stop using the CHG and inform your nurse when you arrive at Short Stay. Do not shave (including legs and underarms) for at least 48 hours prior to the first CHG shower.  You may shave your face/neck. Please follow these instructions carefully:  1.  Shower with CHG Soap the night before surgery and the  morning of Surgery.  2.  If you choose to wash your hair, wash your hair first as usual with your  normal  shampoo.  3.  After you shampoo, rinse your hair and body thoroughly to remove the  shampoo.                           4.  Use CHG as you would any other liquid soap.  You can apply chg directly  to the skin and wash                       Gently with a scrungie or clean washcloth.  5.  Apply the CHG Soap to your body ONLY FROM THE NECK DOWN.   Do not use on face/ open                           Wound or open sores. Avoid contact with eyes, ears mouth and genitals (private parts).                        Wash face,  Genitals (private parts) with your normal soap.             6.  Wash thoroughly, paying special attention to the area where your surgery  will be performed.  7.  Thoroughly rinse your body with warm water from the neck down.  8.  DO NOT shower/wash with your normal soap after using and rinsing off  the CHG Soap.                9.  Pat yourself dry with a clean towel.            10.  Wear clean pajamas.            11.  Place clean sheets on your bed the night of your first shower and do not  sleep with pets. Day of Surgery : Do not apply any lotions/deodorants the morning of surgery.  Please wear clean clothes to the hospital/surgery center.  FAILURE TO FOLLOW THESE INSTRUCTIONS MAY RESULT IN THE CANCELLATION OF YOUR SURGERY PATIENT SIGNATURE_________________________________  NURSE SIGNATURE__________________________________  ________________________________________________________________________   Autumn Price  An incentive spirometer is a tool that can help keep your lungs clear and active. This tool measures how well you are filling your lungs with each breath. Taking long deep breaths may help reverse or decrease the chance of developing breathing (pulmonary) problems (especially infection) following:  A long period of time when you are unable to move or be active. BEFORE THE PROCEDURE   If the spirometer includes an indicator to show your best effort, your nurse or respiratory therapist will set it to a desired goal.  If possible, sit up straight or lean slightly forward. Try not to slouch.  Hold the incentive spirometer in an upright position. INSTRUCTIONS FOR USE  1. Sit on the edge of your bed if possible, or sit up as far as you can in bed or on a chair. 2. Hold the incentive spirometer in an upright position. 3. Breathe out normally. 4. Place the mouthpiece in your mouth and seal your lips tightly around it. 5. Breathe in slowly and as deeply as  possible, raising the piston or the ball toward the top of the column. 6. Hold your breath for 3-5 seconds or for as long as possible. Allow the piston or ball to fall to the bottom of the column. 7. Remove the mouthpiece from your mouth and breathe out normally. 8. Rest for a few seconds and repeat Steps 1 through 7 at least 10 times every 1-2 hours when you are awake. Take your time and take a few normal breaths between deep breaths. 9. The spirometer may include an indicator to show your best effort. Use the indicator as a goal to work toward during each repetition. 10. After  each set of 10 deep breaths, practice coughing to be sure your lungs are clear. If you have an incision (the cut made at the time of surgery), support your incision when coughing by placing a pillow or rolled up towels firmly against it. Once you are able to get out of bed, walk around indoors and cough well. You may stop using the incentive spirometer when instructed by your caregiver.  RISKS AND COMPLICATIONS  Take your time so you do not get dizzy or light-headed.  If you are in pain, you may need to take or ask for pain medication before doing incentive spirometry. It is harder to take a deep breath if you are having pain. AFTER USE  Rest and breathe slowly and easily.  It can be helpful to keep track of a log of your progress. Your caregiver can provide you with a simple table to help with this. If you are using the spirometer at home, follow these instructions: SEEK MEDICAL CARE IF:   You are having difficultly using the spirometer.  You have trouble using the spirometer as often as instructed.  Your pain medication is not giving enough relief while using the spirometer.  You develop fever of 100.5 F (38.1 C) or higher. SEEK IMMEDIATE MEDICAL CARE IF:   You cough up bloody sputum that had not been present before.  You develop fever of 102 F (38.9 C) or greater.  You develop worsening pain at or near  the incision site. MAKE SURE YOU:   Understand these instructions.  Will watch your condition.  Will get help right away if you are not doing well or get worse. Document Released: 04/14/2007 Document Revised: 02/24/2012 Document Reviewed: 06/15/2007 ExitCare Patient Information 2014 [employer], [place].   ________________________________________________________________________  WHAT IS A BLOOD TRANSFUSION? Blood Transfusion Information  A transfusion is the replacement of blood or some of its parts. Blood is made up of multiple cells which provide different functions.  Red blood cells carry oxygen and are used for blood loss replacement.  White blood cells fight against infection.  Platelets control bleeding.  Plasma helps clot blood.  Other blood products are available for specialized needs, such as hemophilia or other clotting disorders. BEFORE THE TRANSFUSION  Who gives blood for transfusions?   Healthy volunteers who are fully evaluated to make sure their blood is safe. This is blood bank blood. Transfusion therapy is the safest it has ever been in the practice of medicine. Before blood is taken from a donor, a complete history is taken to make sure that person has no history of diseases nor engages in risky social behavior (examples are intravenous drug use or sexual activity with multiple partners). The donor's travel history is screened to minimize risk of transmitting infections, such as malaria. The donated blood is tested for signs of infectious diseases, such as HIV and hepatitis. The blood is then tested to be sure it is compatible with you in order to minimize the chance of a transfusion reaction. If you or a relative donates blood, this is often done in anticipation of surgery and is not appropriate for emergency situations. It takes many days to process the donated blood. RISKS AND COMPLICATIONS Although transfusion therapy is very safe and saves many lives, the main  dangers of transfusion include:   Getting an infectious disease.  Developing a transfusion reaction. This is an allergic reaction to something in the blood you were given. Every precaution is taken to prevent this. The decision to  have a blood transfusion has been considered carefully by your caregiver before blood is given. Blood is not given unless the benefits outweigh the risks. AFTER THE TRANSFUSION  Right after receiving a blood transfusion, you will usually feel much better and more energetic. This is especially true if your red blood cells have gotten low (anemic). The transfusion raises the level of the red blood cells which carry oxygen, and this usually causes an energy increase.  The nurse administering the transfusion will monitor you carefully for complications. HOME CARE INSTRUCTIONS  No special instructions are needed after a transfusion. You may find your energy is better. Speak with your caregiver about any limitations on activity for underlying diseases you may have. SEEK MEDICAL CARE IF:   Your condition is not improving after your transfusion.  You develop redness or irritation at the intravenous (IV) site. SEEK IMMEDIATE MEDICAL CARE IF:  Any of the following symptoms occur over the next 12 hours:  Shaking chills.  You have a temperature by mouth above 102 F (38.9 C), not controlled by medicine.  Chest, back, or muscle pain.  People around you feel you are not acting correctly or are confused.  Shortness of breath or difficulty breathing.  Dizziness and fainting.  You get a rash or develop hives.  You have a decrease in urine output.  Your urine turns a dark color or changes to pink, red, or brown. Any of the following symptoms occur over the next 10 days:  You have a temperature by mouth above 102 F (38.9 C), not controlled by medicine.  Shortness of breath.  Weakness after normal activity.  The white part of the eye turns yellow  (jaundice).  You have a decrease in the amount of urine or are urinating less often.  Your urine turns a dark color or changes to pink, red, or brown. Document Released: 11/29/2000 Document Revised: 02/24/2012 Document Reviewed: 07/18/2008 [facility] Patient Information 2014 [place], [place].  _______________________________________________________________________

## 2018-03-13 ENCOUNTER — Encounter (HOSPITAL_COMMUNITY)
Admission: RE | Admit: 2018-03-13 | Discharge: 2018-03-13 | Disposition: A | Discharge status: Home / Self Care | Payer: Medicare Other | Source: Ambulatory Visit | Attending: Ophthalmology | Admitting: Ophthalmology

## 2018-03-13 ENCOUNTER — Ambulatory Visit: Payer: Medicare Other | Admitting: Internal Medicine

## 2018-03-13 ENCOUNTER — Other Ambulatory Visit: Payer: Self-pay | Admitting: Orthopedic Surgery

## 2018-03-13 ENCOUNTER — Other Ambulatory Visit: Payer: Self-pay

## 2018-03-13 ENCOUNTER — Encounter (HOSPITAL_COMMUNITY): Payer: Self-pay

## 2018-03-13 DIAGNOSIS — Z01812 Encounter for preprocedural laboratory examination: Secondary | ICD-10-CM | POA: Diagnosis not present

## 2018-03-13 DIAGNOSIS — M1711 Unilateral primary osteoarthritis, right knee: Secondary | ICD-10-CM | POA: Diagnosis not present

## 2018-03-13 DIAGNOSIS — Z0183 Encounter for blood typing: Secondary | ICD-10-CM | POA: Diagnosis not present

## 2018-03-13 LAB — CBC
HCT: 46.5 % — ABNORMAL HIGH (ref 36.0–46.0)
Hemoglobin: 14.5 g/dL (ref 12.0–15.0)
MCH: 29.4 pg (ref 26.0–34.0)
MCHC: 31.2 g/dL (ref 30.0–36.0)
MCV: 94.1 fL (ref 78.0–100.0)
Platelets: 191 10*3/uL (ref 150–400)
RBC: 4.94 MIL/uL (ref 3.87–5.11)
RDW: 13.7 % (ref 11.5–15.5)
WBC: 5.9 10*3/uL (ref 4.0–10.5)

## 2018-03-13 LAB — URINALYSIS, ROUTINE W REFLEX MICROSCOPIC
Bilirubin Urine: NEGATIVE
Glucose, UA: NEGATIVE mg/dL
Hgb urine dipstick: NEGATIVE
Ketones, ur: NEGATIVE mg/dL
Nitrite: NEGATIVE
Protein, ur: NEGATIVE mg/dL
Specific Gravity, Urine: 1.023 (ref 1.005–1.030)
pH: 5 (ref 5.0–8.0)

## 2018-03-13 LAB — BASIC METABOLIC PANEL
Anion gap: 11 (ref 5–15)
BUN: 16 mg/dL (ref 6–20)
CO2: 27 mmol/L (ref 22–32)
Calcium: 9.4 mg/dL (ref 8.9–10.3)
Chloride: 102 mmol/L (ref 101–111)
Creatinine, Ser: 0.9 mg/dL (ref 0.44–1.00)
GFR calc Af Amer: >60 mL/min (ref >60)
GFR calc non Af Amer: >60 mL/min (ref >60)
Glucose, Bld: 108 mg/dL — ABNORMAL HIGH (ref 65–99)
Potassium: 4.1 mmol/L (ref 3.5–5.1)
Sodium: 140 mmol/L (ref 135–145)

## 2018-03-13 LAB — SURGICAL PCR SCREEN
MRSA, PCR: NEGATIVE
Staphylococcus aureus: NEGATIVE

## 2018-03-13 LAB — ABO/RH: ABO/RH(D): O POS

## 2018-03-13 LAB — APTT: aPTT: 36 seconds (ref 24–36)

## 2018-03-13 LAB — PROTIME-INR
INR: 1.02
Prothrombin Time: 13.3 seconds (ref 11.4–15.2)

## 2018-03-13 NOTE — Progress Notes (Signed)
03-13-18 UA result routed to Dr. Thomasena Edisollins for review.

## 2018-03-17 ENCOUNTER — Other Ambulatory Visit: Payer: Self-pay

## 2018-03-17 MED ORDER — TRAMADOL HCL 50 MG PO TABS: 50.0000 mg | ORAL_TABLET | Freq: Two times a day (BID) | ORAL | 0 refills | Status: DC | PRN

## 2018-03-17 NOTE — Telephone Encounter (Signed)
Last filled: 01/20/18 #120 Last OV: 02/11/18 Next OV: 07/18/18

## 2018-03-19 MED ORDER — TRANEXAMIC ACID 1000 MG/10ML IV SOLN
1000.0000 mg | INTRAVENOUS | Status: AC
  Administered 2018-03-20: 1000 mg via INTRAVENOUS
  Filled 2018-03-19: qty 1100

## 2018-03-20 ENCOUNTER — Encounter (HOSPITAL_COMMUNITY): Payer: Self-pay | Admitting: *Deleted

## 2018-03-20 ENCOUNTER — Inpatient Hospital Stay (HOSPITAL_COMMUNITY)
Admission: RE | Admit: 2018-03-20 | Discharge: 2018-03-25 | DRG: 470 | Disposition: A | Discharge status: Skilled Nursing Facility | Payer: Medicare Other | Source: Ambulatory Visit | Attending: Specialist | Admitting: Specialist

## 2018-03-20 ENCOUNTER — Encounter (HOSPITAL_COMMUNITY)
Admission: RE | Disposition: A | Discharge status: Skilled Nursing Facility | Payer: Self-pay | Source: Ambulatory Visit | Attending: Specialist

## 2018-03-20 ENCOUNTER — Inpatient Hospital Stay (HOSPITAL_COMMUNITY): Payer: Medicare Other | Admitting: Certified Registered Nurse Anesthetist

## 2018-03-20 ENCOUNTER — Other Ambulatory Visit: Payer: Self-pay

## 2018-03-20 DIAGNOSIS — Z791 Long term (current) use of non-steroidal anti-inflammatories (NSAID): Secondary | ICD-10-CM

## 2018-03-20 DIAGNOSIS — G8918 Other acute postprocedural pain: Secondary | ICD-10-CM | POA: Diagnosis not present

## 2018-03-20 DIAGNOSIS — E669 Obesity, unspecified: Secondary | ICD-10-CM | POA: Diagnosis present

## 2018-03-20 DIAGNOSIS — S8990XA Unspecified injury of unspecified lower leg, initial encounter: Secondary | ICD-10-CM | POA: Diagnosis not present

## 2018-03-20 DIAGNOSIS — M1711 Unilateral primary osteoarthritis, right knee: Secondary | ICD-10-CM | POA: Diagnosis not present

## 2018-03-20 DIAGNOSIS — G8911 Acute pain due to trauma: Secondary | ICD-10-CM | POA: Diagnosis not present

## 2018-03-20 DIAGNOSIS — Z6841 Body Mass Index (BMI) 40.0 and over, adult: Secondary | ICD-10-CM

## 2018-03-20 DIAGNOSIS — Z4889 Encounter for other specified surgical aftercare: Secondary | ICD-10-CM | POA: Diagnosis not present

## 2018-03-20 DIAGNOSIS — I10 Essential (primary) hypertension: Secondary | ICD-10-CM | POA: Diagnosis not present

## 2018-03-20 DIAGNOSIS — Z96659 Presence of unspecified artificial knee joint: Secondary | ICD-10-CM

## 2018-03-20 DIAGNOSIS — Z96651 Presence of right artificial knee joint: Secondary | ICD-10-CM | POA: Diagnosis not present

## 2018-03-20 DIAGNOSIS — Z79899 Other long term (current) drug therapy: Secondary | ICD-10-CM

## 2018-03-20 DIAGNOSIS — M1990 Unspecified osteoarthritis, unspecified site: Secondary | ICD-10-CM | POA: Diagnosis not present

## 2018-03-20 DIAGNOSIS — R262 Difficulty in walking, not elsewhere classified: Secondary | ICD-10-CM | POA: Diagnosis not present

## 2018-03-20 DIAGNOSIS — I872 Venous insufficiency (chronic) (peripheral): Secondary | ICD-10-CM | POA: Diagnosis not present

## 2018-03-20 HISTORY — PX: TOTAL KNEE ARTHROPLASTY: SHX125

## 2018-03-20 LAB — TYPE AND SCREEN
ABO/RH(D): O POS
Antibody Screen: NEGATIVE

## 2018-03-20 SURGERY — ARTHROPLASTY, KNEE, TOTAL
Anesthesia: Spinal | Site: Knee | Laterality: Right

## 2018-03-20 MED ORDER — KETOROLAC TROMETHAMINE 30 MG/ML IJ SOLN
INTRAMUSCULAR | Status: AC
  Filled 2018-03-20: qty 1

## 2018-03-20 MED ORDER — LACTATED RINGERS IV SOLN
INTRAVENOUS | Status: DC
  Administered 2018-03-20 (×2): via INTRAVENOUS

## 2018-03-20 MED ORDER — METHOCARBAMOL 500 MG PO TABS
500.0000 mg | ORAL_TABLET | Freq: Four times a day (QID) | ORAL | Status: DC | PRN
  Administered 2018-03-21 – 2018-03-24 (×8): 500 mg via ORAL
  Filled 2018-03-20 (×8): qty 1

## 2018-03-20 MED ORDER — ONDANSETRON HCL 4 MG/2ML IJ SOLN
INTRAMUSCULAR | Status: AC
  Filled 2018-03-20: qty 2

## 2018-03-20 MED ORDER — PROPOFOL 10 MG/ML IV BOLUS
INTRAVENOUS | Status: AC
  Filled 2018-03-20: qty 40

## 2018-03-20 MED ORDER — CEFAZOLIN SODIUM-DEXTROSE 2-4 GM/100ML-% IV SOLN
INTRAVENOUS | Status: AC
  Filled 2018-03-20: qty 100

## 2018-03-20 MED ORDER — KETOCONAZOLE 2 % EX CREA: 1.0000 "application " | TOPICAL_CREAM | Freq: Two times a day (BID) | CUTANEOUS | Status: DC | PRN

## 2018-03-20 MED ORDER — HYDROMORPHONE HCL 1 MG/ML IJ SOLN
0.5000 mg | INTRAMUSCULAR | Status: DC | PRN
  Administered 2018-03-21 (×2): 1 mg via INTRAVENOUS
  Filled 2018-03-20 (×2): qty 1

## 2018-03-20 MED ORDER — BISACODYL 5 MG PO TBEC: 5.0000 mg | DELAYED_RELEASE_TABLET | Freq: Every day | ORAL | Status: DC | PRN

## 2018-03-20 MED ORDER — POLYETHYLENE GLYCOL 3350 17 G PO PACK: 17.0000 g | PACK | Freq: Every day | ORAL | Status: DC | PRN

## 2018-03-20 MED ORDER — METOCLOPRAMIDE HCL 5 MG/ML IJ SOLN: 10.0000 mg | Freq: Once | INTRAMUSCULAR | Status: DC | PRN

## 2018-03-20 MED ORDER — ACETAMINOPHEN 500 MG PO TABS
1000.0000 mg | ORAL_TABLET | Freq: Four times a day (QID) | ORAL | Status: AC
  Administered 2018-03-20 – 2018-03-21 (×4): 1000 mg via ORAL
  Filled 2018-03-20 (×4): qty 2

## 2018-03-20 MED ORDER — OXYCODONE HCL 5 MG PO TABS
5.0000 mg | ORAL_TABLET | ORAL | Status: DC | PRN
  Administered 2018-03-20 – 2018-03-22 (×5): 10 mg via ORAL
  Filled 2018-03-20 (×2): qty 2
  Filled 2018-03-20: qty 1
  Filled 2018-03-20 (×6): qty 2

## 2018-03-20 MED ORDER — BUPIVACAINE IN DEXTROSE 0.75-8.25 % IT SOLN
INTRATHECAL | Status: DC | PRN
  Administered 2018-03-20: 1.8 mL via INTRATHECAL

## 2018-03-20 MED ORDER — DEXAMETHASONE SODIUM PHOSPHATE 10 MG/ML IJ SOLN
INTRAMUSCULAR | Status: AC
  Filled 2018-03-20: qty 1

## 2018-03-20 MED ORDER — MIDAZOLAM HCL 2 MG/2ML IJ SOLN
INTRAMUSCULAR | Status: AC
  Administered 2018-03-20: 1 mg via INTRAVENOUS
  Filled 2018-03-20: qty 2

## 2018-03-20 MED ORDER — METHOCARBAMOL 1000 MG/10ML IJ SOLN
500.0000 mg | Freq: Four times a day (QID) | INTRAVENOUS | Status: DC | PRN
  Administered 2018-03-20: 500 mg via INTRAVENOUS
  Filled 2018-03-20: qty 550

## 2018-03-20 MED ORDER — ONDANSETRON HCL 4 MG PO TABS: 4.0000 mg | ORAL_TABLET | Freq: Four times a day (QID) | ORAL | Status: DC | PRN

## 2018-03-20 MED ORDER — FENTANYL CITRATE (PF) 100 MCG/2ML IJ SOLN
50.0000 ug | INTRAMUSCULAR | Status: DC
Start: 2018-03-20 — End: 2018-03-25
  Administered 2018-03-20: 100 ug via INTRAVENOUS

## 2018-03-20 MED ORDER — BUPIVACAINE-EPINEPHRINE 0.5% -1:200000 IJ SOLN
INTRAMUSCULAR | Status: DC | PRN
  Administered 2018-03-20: 15 mL

## 2018-03-20 MED ORDER — SODIUM CHLORIDE 0.9 % IJ SOLN
INTRAMUSCULAR | Status: AC
  Filled 2018-03-20: qty 50

## 2018-03-20 MED ORDER — HYDROMORPHONE HCL 1 MG/ML IJ SOLN
0.2500 mg | INTRAMUSCULAR | Status: DC | PRN
  Administered 2018-03-20 (×4): 0.5 mg via INTRAVENOUS

## 2018-03-20 MED ORDER — PROPOFOL 10 MG/ML IV BOLUS
INTRAVENOUS | Status: AC
  Filled 2018-03-20: qty 20

## 2018-03-20 MED ORDER — FENTANYL CITRATE (PF) 100 MCG/2ML IJ SOLN
INTRAMUSCULAR | Status: AC
  Administered 2018-03-20: 100 ug via INTRAVENOUS
  Filled 2018-03-20: qty 2

## 2018-03-20 MED ORDER — MEPERIDINE HCL 50 MG/ML IJ SOLN: 6.2500 mg | INTRAMUSCULAR | Status: DC | PRN

## 2018-03-20 MED ORDER — BUPIVACAINE-EPINEPHRINE (PF) 0.5% -1:200000 IJ SOLN
INTRAMUSCULAR | Status: AC
  Filled 2018-03-20: qty 30

## 2018-03-20 MED ORDER — FERROUS SULFATE 325 (65 FE) MG PO TABS
325.0000 mg | ORAL_TABLET | Freq: Three times a day (TID) | ORAL | Status: DC
  Administered 2018-03-21 – 2018-03-25 (×13): 325 mg via ORAL
  Filled 2018-03-20 (×12): qty 1

## 2018-03-20 MED ORDER — ENOXAPARIN SODIUM 30 MG/0.3ML ~~LOC~~ SOLN
30.0000 mg | Freq: Two times a day (BID) | SUBCUTANEOUS | Status: DC
  Administered 2018-03-21 – 2018-03-25 (×9): 30 mg via SUBCUTANEOUS
  Filled 2018-03-20 (×9): qty 0.3

## 2018-03-20 MED ORDER — SODIUM CHLORIDE 0.9 % IV SOLN: INTRAVENOUS | Status: DC

## 2018-03-20 MED ORDER — PROPOFOL 10 MG/ML IV BOLUS
INTRAVENOUS | Status: DC | PRN
  Administered 2018-03-20 (×2): 30 mg via INTRAVENOUS
  Administered 2018-03-20: 20 mg via INTRAVENOUS

## 2018-03-20 MED ORDER — DEXAMETHASONE SODIUM PHOSPHATE 10 MG/ML IJ SOLN
10.0000 mg | Freq: Once | INTRAMUSCULAR | Status: AC
  Administered 2018-03-20: 10 mg via INTRAVENOUS

## 2018-03-20 MED ORDER — DIPHENHYDRAMINE HCL 12.5 MG/5ML PO ELIX: 12.5000 mg | ORAL_SOLUTION | ORAL | Status: DC | PRN

## 2018-03-20 MED ORDER — PROPOFOL 500 MG/50ML IV EMUL
INTRAVENOUS | Status: DC | PRN
  Administered 2018-03-20: 100 ug/kg/min via INTRAVENOUS

## 2018-03-20 MED ORDER — DOCUSATE SODIUM 100 MG PO CAPS
100.0000 mg | ORAL_CAPSULE | Freq: Two times a day (BID) | ORAL | Status: DC
  Administered 2018-03-20 – 2018-03-25 (×10): 100 mg via ORAL
  Filled 2018-03-20 (×10): qty 1

## 2018-03-20 MED ORDER — KETAMINE HCL 10 MG/ML IJ SOLN
INTRAMUSCULAR | Status: AC
Start: 2018-03-20 — End: 2018-03-20
  Filled 2018-03-20: qty 1

## 2018-03-20 MED ORDER — CEFAZOLIN SODIUM-DEXTROSE 2-4 GM/100ML-% IV SOLN
2.0000 g | Freq: Four times a day (QID) | INTRAVENOUS | Status: AC
  Administered 2018-03-20 – 2018-03-21 (×2): 2 g via INTRAVENOUS
  Filled 2018-03-20 (×2): qty 100

## 2018-03-20 MED ORDER — ONDANSETRON HCL 4 MG/2ML IJ SOLN: 4.0000 mg | Freq: Four times a day (QID) | INTRAMUSCULAR | Status: DC | PRN

## 2018-03-20 MED ORDER — OXYCODONE HCL 5 MG PO TABS
10.0000 mg | ORAL_TABLET | ORAL | Status: DC | PRN
  Administered 2018-03-20 – 2018-03-22 (×3): 10 mg via ORAL
  Administered 2018-03-22 – 2018-03-24 (×8): 15 mg via ORAL
  Filled 2018-03-20: qty 2
  Filled 2018-03-20 (×7): qty 3

## 2018-03-20 MED ORDER — FENTANYL CITRATE (PF) 100 MCG/2ML IJ SOLN
INTRAMUSCULAR | Status: AC
  Filled 2018-03-20: qty 2

## 2018-03-20 MED ORDER — HYDROMORPHONE HCL 1 MG/ML IJ SOLN
INTRAMUSCULAR | Status: AC
  Administered 2018-03-21: 1 mg via INTRAVENOUS
  Filled 2018-03-20: qty 1

## 2018-03-20 MED ORDER — FENTANYL CITRATE (PF) 100 MCG/2ML IJ SOLN
INTRAMUSCULAR | Status: DC | PRN
  Administered 2018-03-20 (×2): 50 ug via INTRAVENOUS

## 2018-03-20 MED ORDER — CHLORHEXIDINE GLUCONATE 4 % EX LIQD
60.0000 mL | Freq: Once | CUTANEOUS | Status: DC
  Filled 2018-03-20: qty 60

## 2018-03-20 MED ORDER — SODIUM CHLORIDE 0.9 % IR SOLN
Status: DC | PRN
  Administered 2018-03-20: 1000 mL

## 2018-03-20 MED ORDER — ROPIVACAINE HCL 7.5 MG/ML IJ SOLN
INTRAMUSCULAR | Status: DC | PRN
  Administered 2018-03-20: 20 mL via PERINEURAL

## 2018-03-20 MED ORDER — AMLODIPINE BESYLATE 5 MG PO TABS
5.0000 mg | ORAL_TABLET | Freq: Every day | ORAL | Status: DC
  Administered 2018-03-21 – 2018-03-25 (×5): 5 mg via ORAL
  Filled 2018-03-20 (×6): qty 1

## 2018-03-20 MED ORDER — SERTRALINE HCL 50 MG PO TABS
75.0000 mg | ORAL_TABLET | Freq: Every day | ORAL | Status: DC
  Administered 2018-03-21 – 2018-03-25 (×5): 75 mg via ORAL
  Filled 2018-03-20 (×6): qty 1

## 2018-03-20 MED ORDER — MAGNESIUM CITRATE PO SOLN: 1.0000 | Freq: Once | ORAL | Status: DC | PRN

## 2018-03-20 MED ORDER — KETAMINE HCL 10 MG/ML IJ SOLN
INTRAMUSCULAR | Status: DC | PRN
  Administered 2018-03-20: 10 mg via INTRAVENOUS
  Administered 2018-03-20: 5 mg via INTRAVENOUS

## 2018-03-20 MED ORDER — KETOROLAC TROMETHAMINE 30 MG/ML IJ SOLN
INTRAMUSCULAR | Status: DC | PRN
  Administered 2018-03-20: 30 mg via INTRAMUSCULAR

## 2018-03-20 MED ORDER — ONDANSETRON HCL 4 MG/2ML IJ SOLN
INTRAMUSCULAR | Status: DC | PRN
  Administered 2018-03-20: 4 mg via INTRAVENOUS

## 2018-03-20 MED ORDER — BUPIVACAINE HCL (PF) 0.25 % IJ SOLN
INTRAMUSCULAR | Status: AC
  Filled 2018-03-20: qty 30

## 2018-03-20 MED ORDER — ALUM & MAG HYDROXIDE-SIMETH 200-200-20 MG/5ML PO SUSP: 30.0000 mL | ORAL | Status: DC | PRN

## 2018-03-20 MED ORDER — MENTHOL 3 MG MT LOZG: 1.0000 | LOZENGE | OROMUCOSAL | Status: DC | PRN

## 2018-03-20 MED ORDER — SODIUM CHLORIDE 0.9 % IJ SOLN
INTRAMUSCULAR | Status: DC | PRN
  Administered 2018-03-20: 45 mL

## 2018-03-20 MED ORDER — ACETAMINOPHEN 325 MG PO TABS: 325.0000 mg | ORAL_TABLET | Freq: Four times a day (QID) | ORAL | Status: DC | PRN

## 2018-03-20 MED ORDER — 0.9 % SODIUM CHLORIDE (POUR BTL) OPTIME
TOPICAL | Status: DC | PRN
  Administered 2018-03-20: 1000 mL

## 2018-03-20 MED ORDER — MIDAZOLAM HCL 2 MG/2ML IJ SOLN
1.0000 mg | INTRAMUSCULAR | Status: DC
  Administered 2018-03-20: 1 mg via INTRAVENOUS

## 2018-03-20 MED ORDER — CEFAZOLIN SODIUM-DEXTROSE 2-4 GM/100ML-% IV SOLN
2.0000 g | INTRAVENOUS | Status: AC
  Administered 2018-03-20: 2 g via INTRAVENOUS
  Filled 2018-03-20: qty 100

## 2018-03-20 MED ORDER — PHENOL 1.4 % MT LIQD
1.0000 | OROMUCOSAL | Status: DC | PRN
  Filled 2018-03-20: qty 177

## 2018-03-20 MED ORDER — DEXAMETHASONE SODIUM PHOSPHATE 10 MG/ML IJ SOLN
10.0000 mg | Freq: Once | INTRAMUSCULAR | Status: AC
  Administered 2018-03-21: 11:00:00 10 mg via INTRAVENOUS
  Filled 2018-03-20: qty 1

## 2018-03-20 MED ORDER — METOCLOPRAMIDE HCL 5 MG/ML IJ SOLN: 5.0000 mg | Freq: Three times a day (TID) | INTRAMUSCULAR | Status: DC | PRN

## 2018-03-20 MED ORDER — METOCLOPRAMIDE HCL 5 MG PO TABS: 5.0000 mg | ORAL_TABLET | Freq: Three times a day (TID) | ORAL | Status: DC | PRN

## 2018-03-20 MED ORDER — HYDROMORPHONE HCL 1 MG/ML IJ SOLN
INTRAMUSCULAR | Status: AC
  Filled 2018-03-20: qty 1

## 2018-03-20 SURGICAL SUPPLY — 61 items
BAG DECANTER FOR FLEXI CONT (MISCELLANEOUS) IMPLANT
BAG ZIPLOCK 12X15 (MISCELLANEOUS) ×2 IMPLANT
BANDAGE ACE 4X5 VEL STRL LF (GAUZE/BANDAGES/DRESSINGS) ×2 IMPLANT
BANDAGE ACE 6X5 VEL STRL LF (GAUZE/BANDAGES/DRESSINGS) ×2 IMPLANT
BLADE SAG 18X100X1.27 (BLADE) ×2 IMPLANT
BLADE SAW SGTL 13.0X1.19X90.0M (BLADE) ×2 IMPLANT
BOWL SMART MIX CTS (DISPOSABLE) ×2 IMPLANT
CAP KNEE TOTAL 3 SIGMA ×2 IMPLANT
CEMENT HV SMART SET (Cement) ×4 IMPLANT
COVER SURGICAL LIGHT HANDLE (MISCELLANEOUS) ×2 IMPLANT
CUFF TOURN SGL QUICK 34 (TOURNIQUET CUFF) ×1
CUFF TRNQT CYL 34X4X40X1 (TOURNIQUET CUFF) ×1 IMPLANT
DECANTER SPIKE VIAL GLASS SM (MISCELLANEOUS) ×2 IMPLANT
DERMABOND ADVANCED (GAUZE/BANDAGES/DRESSINGS) ×1
DERMABOND ADVANCED .7 DNX12 (GAUZE/BANDAGES/DRESSINGS) ×1 IMPLANT
DRAPE TOP 10253 STERILE (DRAPES) IMPLANT
DRAPE U-SHAPE 47X51 STRL (DRAPES) ×2 IMPLANT
DRESSING AQUACEL AG SP 3.5X10 (GAUZE/BANDAGES/DRESSINGS) ×1 IMPLANT
DRSG AQUACEL AG ADV 3.5X10 (GAUZE/BANDAGES/DRESSINGS) IMPLANT
DRSG AQUACEL AG SP 3.5X10 (GAUZE/BANDAGES/DRESSINGS) ×2
DRSG TEGADERM 4X4.75 (GAUZE/BANDAGES/DRESSINGS) ×2 IMPLANT
DURAPREP 26ML APPLICATOR (WOUND CARE) ×4 IMPLANT
ELECT REM PT RETURN 15FT ADLT (MISCELLANEOUS) ×2 IMPLANT
EVACUATOR 1/8 PVC DRAIN (DRAIN) ×2 IMPLANT
GAUZE SPONGE 2X2 8PLY STRL LF (GAUZE/BANDAGES/DRESSINGS) ×1 IMPLANT
GLOVE BIO SURGEON STRL SZ7.5 (GLOVE) ×4 IMPLANT
GLOVE BIOGEL PI IND STRL 8 (GLOVE) ×2 IMPLANT
GLOVE BIOGEL PI INDICATOR 8 (GLOVE) ×2
GLOVE ECLIPSE 8.0 STRL XLNG CF (GLOVE) ×4 IMPLANT
GLOVE SURG ORTHO 9.0 STRL STRW (GLOVE) ×2 IMPLANT
GLOVE SURG SS PI 7.5 STRL IVOR (GLOVE) ×2 IMPLANT
GOWN STRL REUS W/TWL XL LVL3 (GOWN DISPOSABLE) ×4 IMPLANT
HANDPIECE INTERPULSE COAX TIP (DISPOSABLE) ×1
HOLDER FOLEY CATH W/STRAP (MISCELLANEOUS) ×2 IMPLANT
IMMOBILIZER KNEE 20 (SOFTGOODS) ×2 IMPLANT
IMMOBILIZER KNEE 20 THIGH 36 (SOFTGOODS) IMPLANT
NS IRRIG 1000ML POUR BTL (IV SOLUTION) ×2 IMPLANT
PACK TOTAL KNEE CUSTOM (KITS) ×2 IMPLANT
POSITIONER SURGICAL ARM (MISCELLANEOUS) ×2 IMPLANT
SET HNDPC FAN SPRY TIP SCT (DISPOSABLE) ×1 IMPLANT
SET PAD KNEE POSITIONER (MISCELLANEOUS) ×2 IMPLANT
SPONGE GAUZE 2X2 STER 10/PKG (GAUZE/BANDAGES/DRESSINGS) ×1
SPONGE LAP 18X18 X RAY DECT (DISPOSABLE) IMPLANT
SPONGE SURGIFOAM ABS GEL 100 (HEMOSTASIS) ×2 IMPLANT
STOCKINETTE 6  STRL (DRAPES) ×1
STOCKINETTE 6 STRL (DRAPES) ×1 IMPLANT
SUCTION FRAZIER HANDLE 12FR (TUBING) ×1
SUCTION TUBE FRAZIER 12FR DISP (TUBING) ×1 IMPLANT
SUT BONE WAX W31G (SUTURE) IMPLANT
SUT MNCRL AB 3-0 PS2 18 (SUTURE) ×2 IMPLANT
SUT VIC AB 1 CT1 27 (SUTURE) ×4
SUT VIC AB 1 CT1 27XBRD ANTBC (SUTURE) ×4 IMPLANT
SUT VIC AB 2-0 CT1 27 (SUTURE) ×3
SUT VIC AB 2-0 CT1 TAPERPNT 27 (SUTURE) ×3 IMPLANT
SUT VLOC 180 0 24IN GS25 (SUTURE) ×2 IMPLANT
SYR 50ML LL SCALE MARK (SYRINGE) ×2 IMPLANT
TAPE STRIPS DRAPE STRL (GAUZE/BANDAGES/DRESSINGS) ×2 IMPLANT
TRAY FOLEY W/METER SILVER 16FR (SET/KITS/TRAYS/PACK) IMPLANT
WATER STERILE IRR 1000ML POUR (IV SOLUTION) ×4 IMPLANT
WRAP KNEE MAXI GEL POST OP (GAUZE/BANDAGES/DRESSINGS) ×2 IMPLANT
YANKAUER SUCT BULB TIP 10FT TU (MISCELLANEOUS) ×2 IMPLANT

## 2018-03-20 NOTE — Op Note (Signed)
DATE OF SURGERY:  03/20/2018  TIME: 3:22 PM  PATIENT NAME:  Autumn Price    AGE: 77 y.o.   PRE-OPERATIVE DIAGNOSIS:  Right knee osteoarthritis  POST-OPERATIVE DIAGNOSIS:  Right knee osteoarthritis  PROCEDURE:  Procedure(s): RIGHT TOTAL KNEE ARTHROPLASTY  SURGEON:  Treasure Ingrum ANDREW  ASSISTANT:  Bryson Stilwell, PA-C, present and scrubbed throughout the case, critical for assistance with exposure, retraction, instrumentation, and closure.  OPERATIVE IMPLANTS: Depuy PFC Sigma Rotating Platform.  Femur size 3, Tibia size 3, Patella size 35 3-peg oval button, with a 12.5 mm polyethylene insert.   PREOPERATIVE INDICATIONS:   Autumn Price is a 77 y.o. year old female with end stage bone on bone arthritis of the knee who failed conservative treatment and elected for Total Knee Arthroplasty.   The risks, benefits, and alternatives were discussed at length including but not limited to the risks of infection, bleeding, nerve injury, stiffness, blood clots, the need for revision surgery, cardiopulmonary complications, among others, and they were willing to proceed.  OPERATIVE DESCRIPTION:  The patient was brought to the operative room and placed in a supine position.  Spinal anesthesia was administered.  IV antibiotics were given.  The lower extremity was prepped and draped in the usual sterile fashion.  Time out was performed.  The leg was elevated and exsanguinated and the tourniquet was inflated.  Anterior quadriceps tendon splitting approach was performed.  The patella was retracted and osteophytes were removed.  The anterior horn of the medial and lateral meniscus was removed and cruciate ligaments resected.   The distal femur was opened with the drill and the intramedullary distal femoral cutting jig was utilized, set at 5 degrees resecting 10 mm off the distal femur.  Care was taken to protect the collateral ligaments.  The distal femoral sizing jig was applied, taking care to  avoid notching.  Then the 4-in-1 cutting jig was applied and the anterior and posterior femur was cut, along with the chamfer cuts.    Then the extramedullary tibial cutting jig was utilized making the appropriate cut using the anterior tibial crest as a reference building in appropriate posterior slope.  Care was taken during the cut to protect the medial and collateral ligaments.  The proximal tibia was removed along with the posterior horns of the menisci.   The posterior medial femoral osteophytes and posterior lateral femoral osteophytes were removed.    The flexion gap was then measured and was symmetric with the extension gap, measured at 12.  I completed the distal femoral preparation using the appropriate jig to prepare the box.  The patella was then measured, and cut with the saw.    The proximal tibia sized and prepared accordingly with the reamer and the punch, and then all components were trialed with the trial insert.  The knee was found to have excellent balance and full motion.    The above named components were then cemented into place and all excess cement was removed.  The trial polyethylene component was in place during cementation, and then was exchanged for the real polyethylene component.    The knee was easily taken through a range of motion and the patella tracked well and the knee irrigated copiously and the parapatellar and subcutaneous tissue closed with vicryl, and monocryl with steri strips for the skin.  The arthrotomy was closed at 90 of flexion. The wounds were dressed with sterile gauze and the tourniquet released and the patient was awakened and returned to the PACU  in stable and satisfactory condition.  There were no complications.  Total tourniquet time was 85 minutes.

## 2018-03-20 NOTE — Progress Notes (Signed)
AssistedDr. Foster with right, ultrasound guided, adductor canal block. Side rails up, monitors on throughout procedure. See vital signs in flow sheet. Tolerated Procedure well.  

## 2018-03-20 NOTE — Anesthesia Procedure Notes (Signed)
Procedure Name: MAC Date/Time: 03/20/2018 1:26 PM Performed by: West Pugh, CRNA Pre-anesthesia Checklist: Patient identified, Emergency Drugs available, Suction available, Patient being monitored and Timeout performed Patient Re-evaluated:Patient Re-evaluated prior to induction Oxygen Delivery Method: Nasal cannula Placement Confirmation: positive ETCO2 Dental Injury: Teeth and Oropharynx as per pre-operative assessment

## 2018-03-20 NOTE — Interval H&P Note (Signed)
History and Physical Interval Note:  03/20/2018 1:06 PM  Autumn Price  has presented today for surgery, with the diagnosis of Right knee osteoarthritis  The various methods of treatment have been discussed with the patient and family. After consideration of risks, benefits and other options for treatment, the patient has consented to  Procedure(s): RIGHT TOTAL KNEE ARTHROPLASTY (Right) as a surgical intervention .  The patient's history has been reviewed, patient examined, no change in status, stable for surgery.  I have reviewed the patient's chart and labs.  Questions were answered to the patient's satisfaction.     Sybel Standish ANDREW

## 2018-03-20 NOTE — Plan of Care (Signed)
Plan of care for post op day 0 has been discussed with patient. Will continue to educate related to pain, diet progression, physical therapy, and knee precautions.

## 2018-03-20 NOTE — Anesthesia Preprocedure Evaluation (Signed)
Anesthesia Evaluation  Patient identified by MRN, date of birth, ID band Patient awake    Reviewed: Allergy & Precautions, NPO status , Patient's Chart, lab work & pertinent test results  Airway Mallampati: III  TM Distance: >3 FB Neck ROM: Full    Dental   Pulmonary neg pulmonary ROS,    Pulmonary exam normal breath sounds clear to auscultation       Cardiovascular hypertension, Pt. on medications + Peripheral Vascular Disease  Normal cardiovascular exam Rhythm:Regular Rate:Normal  Chronic venous insufficiency   Neuro/Psych PSYCHIATRIC DISORDERS Mood disorder negative neurological ROS     GI/Hepatic negative GI ROS, Neg liver ROS,   Endo/Other  Morbid obesity  Renal/GU negative Renal ROS   Urinary incontinence    Musculoskeletal  (+) Arthritis , Osteoarthritis,    Abdominal (+) + obese,   Peds  Hematology   Anesthesia Other Findings   Reproductive/Obstetrics                             Anesthesia Physical Anesthesia Plan  ASA: III  Anesthesia Plan: Spinal   Post-op Pain Management:  Regional for Post-op pain   Induction:   PONV Risk Score and Plan: 3 and Ondansetron, Dexamethasone, Midazolam and Treatment may vary due to age or medical condition  Airway Management Planned: Natural Airway, Nasal Cannula and Simple Face Mask  Additional Equipment:   Intra-op Plan:   Post-operative Plan:   Informed Consent: I have reviewed the patients History and Physical, chart, labs and discussed the procedure including the risks, benefits and alternatives for the proposed anesthesia with the patient or authorized representative who has indicated his/her understanding and acceptance.   Dental advisory given  Plan Discussed with: CRNA, Anesthesiologist and Surgeon  Anesthesia Plan Comments:         Anesthesia Quick Evaluation

## 2018-03-20 NOTE — Anesthesia Procedure Notes (Signed)
Anesthesia Regional Block: Adductor canal block   Pre-Anesthetic Checklist: ,, timeout performed, Correct Patient, Correct Site, Correct Laterality, Correct Procedure, Correct Position, site marked, Risks and benefits discussed,  Surgical consent,  Pre-op evaluation,  At surgeon's request and post-op pain management  Laterality: Right  Prep: chloraprep       Needles:  Injection technique: Single-shot  Needle Type: Echogenic Stimulator Needle     Needle Length: 9cm  Needle Gauge: 21   Needle insertion depth: 8 cm   Additional Needles:   Narrative:  Start time: 03/20/2018 1:05 PM End time: 03/20/2018 1:11 PM Injection made incrementally with aspirations every 5 mL.  Performed by: Personally  Anesthesiologist: Mal AmabileFoster, Hector Taft, MD  Additional Notes: Timeout performed. Patient sedated. Relevant anatomy ID'd using US. Incremental 2-395ml injection of LA with frequent aspiration. Patient tolerated procedure well.        Right Adductor Canal Block

## 2018-03-20 NOTE — Anesthesia Postprocedure Evaluation (Signed)
Anesthesia Post Note  Patient: Autumn Price  Procedure(s) Performed: RIGHT TOTAL KNEE ARTHROPLASTY (Right Knee)     Patient location during evaluation: PACU Anesthesia Type: Spinal Level of consciousness: oriented and awake and alert Pain management: pain level controlled Vital Signs Assessment: post-procedure vital signs reviewed and stable Respiratory status: spontaneous breathing, respiratory function stable, patient connected to nasal cannula oxygen and nonlabored ventilation Cardiovascular status: blood pressure returned to baseline and stable Postop Assessment: no headache, no backache, no apparent nausea or vomiting, spinal receding and patient able to bend at knees Anesthetic complications: no    Last Vitals:  Vitals:   03/20/18 1615 03/20/18 1630  BP: (!) 137/111 (!) 148/75  Pulse: 73 71  Resp: 16 19  Temp:    SpO2: 97% 99%    Last Pain:  Vitals:   03/20/18 1645  TempSrc:   PainSc: (P) 8                  Rakeya Glab A.

## 2018-03-20 NOTE — Anesthesia Procedure Notes (Addendum)
Spinal  Patient location during procedure: OR Start time: 03/20/2018 1:26 PM End time: 03/20/2018 1:33 PM Reason for block: at surgeon's request Staffing Resident/CRNA: West Pugh, CRNA Performed: resident/CRNA  Preanesthetic Checklist Completed: patient identified, site marked, surgical consent, pre-op evaluation, timeout performed, IV checked, risks and benefits discussed and monitors and equipment checked Spinal Block Patient position: sitting Prep: DuraPrep Patient monitoring: heart rate, continuous pulse ox and blood pressure Approach: right paramedian Location: L3-4 Injection technique: single-shot Needle Needle type: Pencan  Needle gauge: 24 G Needle length: 9 cm Assessment Sensory level: T6 Additional Notes Expiration of kit checked and confirmed. Patient tolerated procedure well,without complications x 1 attempt with noted clear CSF. Loss of motor and sensory on exam post injection.

## 2018-03-20 NOTE — Transfer of Care (Signed)
Immediate Anesthesia Transfer of Care Note  Patient: Autumn Price  Procedure(s) Performed: RIGHT TOTAL KNEE ARTHROPLASTY (Right Knee)  Patient Location: PACU  Anesthesia Type:General  Level of Consciousness: awake, alert , oriented and patient cooperative  Airway & Oxygen Therapy: Patient Spontanous Breathing and Patient connected to nasal cannula oxygen  Post-op Assessment: Report given to RN, Post -op Vital signs reviewed and stable and Patient moving all extremities X 4  Post vital signs: Reviewed and stable  Last Vitals:  Vitals Value Taken Time  BP 138/63 03/20/2018  4:10 PM  Temp    Pulse 71 03/20/2018  4:12 PM  Resp 14 03/20/2018  4:13 PM  SpO2 96 % 03/20/2018  4:12 PM  Vitals shown include unvalidated device data.  Last Pain:  Vitals:   03/20/18 1053  TempSrc:   PainSc: 5       Patients Stated Pain Goal: 3 (03/20/18 1053)  Complications: No apparent anesthesia complications

## 2018-03-21 ENCOUNTER — Other Ambulatory Visit: Payer: Self-pay

## 2018-03-21 LAB — CBC
HCT: 37.7 % (ref 36.0–46.0)
Hemoglobin: 12 g/dL (ref 12.0–15.0)
MCH: 29.4 pg (ref 26.0–34.0)
MCHC: 31.8 g/dL (ref 30.0–36.0)
MCV: 92.4 fL (ref 78.0–100.0)
Platelets: 147 10*3/uL — ABNORMAL LOW (ref 150–400)
RBC: 4.08 MIL/uL (ref 3.87–5.11)
RDW: 13.2 % (ref 11.5–15.5)
WBC: 8.8 10*3/uL (ref 4.0–10.5)

## 2018-03-21 LAB — BASIC METABOLIC PANEL
Anion gap: 10 (ref 5–15)
BUN: 16 mg/dL (ref 6–20)
CO2: 28 mmol/L (ref 22–32)
Calcium: 9.3 mg/dL (ref 8.9–10.3)
Chloride: 101 mmol/L (ref 101–111)
Creatinine, Ser: 0.95 mg/dL (ref 0.44–1.00)
GFR calc Af Amer: >60 mL/min (ref >60)
GFR calc non Af Amer: 58 mL/min — ABNORMAL LOW (ref >60)
Glucose, Bld: 141 mg/dL — ABNORMAL HIGH (ref 65–99)
Potassium: 4.3 mmol/L (ref 3.5–5.1)
Sodium: 139 mmol/L (ref 135–145)

## 2018-03-21 NOTE — Progress Notes (Signed)
    Subjective:  Patient reports pain as moderate to severe.  Denies N/V/CP/SOB. C/o R knee pain.  Objective:   VITALS:   Vitals:   03/20/18 1921 03/20/18 2024 03/21/18 0041 03/21/18 0420  BP: (!) 142/59 140/61 (!) 128/52 (!) 145/58  Pulse: 81 77 71 (!) 53  Resp: 15 14 15 14   Temp: 98.5 F (36.9 C) 98.5 F (36.9 C) 98.2 F (36.8 C) 98.2 F (36.8 C)  TempSrc: Oral Oral Oral Oral  SpO2: 93% 93% 95% 96%  Weight:      Height:        NAD ABD soft Sensation intact distally Intact pulses distally Dorsiflexion/Plantar flexion intact Incision: dressing C/D/I Compartment soft HV ss   Lab Results  Component Value Date   WBC 8.8 03/21/2018   HGB 12.0 03/21/2018   HCT 37.7 03/21/2018   MCV 92.4 03/21/2018   PLT 147 (L) 03/21/2018   BMET    Component Value Date/Time   NA 139 03/21/2018 0546   NA 142 01/20/2014 1303   K 4.3 03/21/2018 0546   K 4.4 01/20/2014 1303   CL 101 03/21/2018 0546   CL 106 01/20/2014 1303   CO2 28 03/21/2018 0546   CO2 33 (H) 01/20/2014 1303   GLUCOSE 141 (H) 03/21/2018 0546   GLUCOSE 129 (H) 01/20/2014 1303   BUN 16 03/21/2018 0546   BUN 13 01/20/2014 1303   CREATININE 0.95 03/21/2018 0546   CREATININE 0.72 01/20/2014 1303   CALCIUM 9.3 03/21/2018 0546   CALCIUM 9.6 01/20/2014 1303   GFRNONAA 58 (L) 03/21/2018 0546   GFRNONAA >60 01/20/2014 1303   GFRAA >60 03/21/2018 0546   GFRAA >60 01/20/2014 1303     Assessment/Plan: 1 Day Post-Op   Active Problems:   Primary osteoarthritis of right knee   S/P knee replacement   WBAT with walker DVT ppx: xarelto, SCDs, TEDS PO pain control PT/OT Dispo: D/C HV drain, plan for D/C home tomorrow   Iline OvenBrian J Kirklin Mcduffee 03/21/2018, 8:18 AM   Samson FredericBrian Travers Goodley, MD Cell [phone]

## 2018-03-21 NOTE — Evaluation (Signed)
Physical Therapy Evaluation Patient Details Name: Autumn Price MRN: [id number] DOB: 1941/04/13 Today's Date: 03/21/2018   History of Present Illness  s/p R TKA; PMHx: HTN  Clinical Impression  Pt is s/p TKA resulting in the deficits listed below (see PT Problem List). * Pt will benefit from skilled PT to increase their independence and safety with mobility to allow discharge to the venue listed below. Pt is motivated, pain elevated but willing to work with PT despite pain, repositioning and mobility provided incr pain relief per pt; will continue to follow in acute setting     Follow Up Recommendations Follow surgeon's recommendation for DC plan and follow-up therapies    Equipment Recommendations  Rolling walker with 5" wheels(may need wide RW)    Recommendations for Other Services       Precautions / Restrictions Precautions Precautions: Knee Required Braces or Orthoses: Knee Immobilizer - Right Restrictions Weight Bearing Restrictions: No Other Position/Activity Restrictions: WBAT      Mobility  Bed Mobility Overal bed mobility: Needs Assistance Bed Mobility: Supine to Sit     Supine to sit: Min assist     General bed mobility comments: assist to elevate trunk, pt using trapeze and bed rail, incr time  Transfers Overall transfer level: Needs assistance Equipment used: Rolling walker (2 wheeled) Transfers: Sit to/from Stand Sit to Stand: Min assist;+2 physical assistance;+2 safety/equipment         General transfer comment: cues for hand placement  Ambulation/Gait Ambulation/Gait assistance: Min assist Ambulation Distance (Feet): 40 Feet Assistive device: Rolling walker (2 wheeled) Gait Pattern/deviations: Step-to pattern;Antalgic     General Gait Details: cues for technique and sequence  Stairs            Wheelchair Mobility    Modified Rankin (Stroke Patients Only)       Balance                                              Pertinent Vitals/Pain Pain Assessment: 0-10 Pain Score: 8  Pain Location: L knee Pain Descriptors / Indicators: Sore Pain Intervention(s): Monitored during session;Premedicated before session;Limited activity within patient's tolerance;Repositioned;Ice applied    Home Living Family/patient expects to be discharged to:: Private residence Living Arrangements: Alone Available Help at Discharge: Family Type of Home: Mobile home Home Access: Stairs to enter Entrance Stairs-Rails: Right;Left;Can reach both Entrance Stairs-Number of Steps: 4 Home Layout: One level Home Equipment: None      Prior Function Level of Independence: Independent               Hand Dominance        Extremity/Trunk Assessment   Upper Extremity Assessment Upper Extremity Assessment: Defer to OT evaluation    Lower Extremity Assessment Lower Extremity Assessment: RLE deficits/detail RLE Deficits / Details: ankle WFL; knee extension and hip flexion 2/5       Communication   Communication: No difficulties  Cognition Arousal/Alertness: Awake/alert Behavior During Therapy: WFL for tasks assessed/performed Overall Cognitive Status: Within Functional Limits for tasks assessed                                        General Comments      Exercises Total Joint Exercises Ankle Circles/Pumps: AROM;Both;10 reps Quad Sets: AROM;Both;10 reps  Assessment/Plan    PT Assessment Patient needs continued PT services  PT Problem List Decreased strength;Decreased range of motion;Decreased activity tolerance;Decreased mobility;Decreased knowledge of use of DME;Pain       PT Treatment Interventions DME instruction;Gait training;Functional mobility training;Stair training;Therapeutic activities;Therapeutic exercise;Patient/family education    PT Goals (Current goals can be found in the Care Plan section)  Acute Rehab PT Goals Patient Stated Goal: home and have less knee pain PT  Goal Formulation: With patient Time For Goal Achievement: 03/28/18 Potential to Achieve Goals: Good    Frequency 7X/week   Barriers to discharge        Co-evaluation               AM-PAC PT "6 Clicks" Daily Activity  Outcome Measure Difficulty turning over in bed (including adjusting bedclothes, sheets and blankets)?: Unable Difficulty moving from lying on back to sitting on the side of the bed? : Unable Difficulty sitting down on and standing up from a chair with arms (e.g., wheelchair, bedside commode, etc,.)?: Unable Help needed moving to and from a bed to chair (including a wheelchair)?: A Lot Help needed walking in hospital room?: A Little Help needed climbing 3-5 steps with a railing? : A Lot 6 Click Score: 10    End of Session Equipment Utilized During Treatment: Gait belt;Right knee immobilizer Activity Tolerance: Patient tolerated treatment well Patient left: in chair;with call bell/phone within reach;with family/visitor present        Time: 1221-1245 PT Time Calculation (min) (ACUTE ONLY): 24 min   Charges:   PT Evaluation $PT Eval Low Complexity: 1 Low PT Treatments $Gait Training: 8-22 mins   PT G CodesDrucilla Chalet:        Autumn Price, PT Pager: [phone] 03/21/2018   Drucilla ChaletWILLIAMS,Ashwin Tibbs 03/21/2018, 1:47 PM

## 2018-03-21 NOTE — Progress Notes (Signed)
Patient refused to sit at the side of the bed this evening because of post-op pain. Will attempt in the morning

## 2018-03-21 NOTE — Progress Notes (Signed)
   03/21/18 1500  PT Visit Information  Last PT Received On 03/21/18  Assistance Needed +1  History of Present Illness s/p R TKA; PMHx: HTN  Subjective Data  Patient Stated Goal home and have less knee pain  Precautions  Precautions Knee  Required Braces or Orthoses Knee Immobilizer - Right  Restrictions  Weight Bearing Restrictions No  Other Position/Activity Restrictions WBAT  Pain Assessment  Pain Assessment 0-10  Pain Score 7  Pain Location L knee  Pain Descriptors / Indicators Sore;Grimacing;Guarding;Cramping  Pain Intervention(s) Limited activity within patient's tolerance;Monitored during session;Patient requesting pain meds-RN notified  Cognition  Arousal/Alertness Awake/alert  Behavior During Therapy WFL for tasks assessed/performed  Overall Cognitive Status Within Functional Limits for tasks assessed  Bed Mobility  Overal bed mobility Needs Assistance  Bed Mobility Sit to Supine  Sit to supine Min assist  General bed mobility comments assist with RLE  Transfers  Overall transfer level Needs assistance  Equipment used Rolling walker (2 wheeled)  Transfers Sit to/from Stand  Sit to Stand Mod assist  General transfer comment cues for hand placement, incr time  Ambulation/Gait  Ambulation/Gait assistance Min assist  Ambulation Distance (Feet) 5 Feet  Assistive device Rolling walker (2 wheeled)  Gait Pattern/deviations Step-to pattern;Antalgic  General Gait Details cues for technique and sequence  Total Joint Exercises  Ankle Circles/Pumps AROM;Both;10 reps  Quad Sets AROM;Both;10 reps  Heel Slides AAROM;Right;10 reps  Hip ABduction/ADduction AAROM;Right;10 reps  Straight Leg Raises AAROM;Right;10 reps  PT - End of Session  Equipment Utilized During Treatment Gait belt  Activity Tolerance Patient tolerated treatment well  Patient left in bed;with call bell/phone within reach (unable to set bed alarm)  Nurse Communication Patient requests pain meds   PT -  Assessment/Plan  PT Plan Current plan remains appropriate  PT Visit Diagnosis Difficulty in walking, not elsewhere classified (R26.2)  PT Frequency (ACUTE ONLY) 7X/week  Follow Up Recommendations Follow surgeon's recommendation for DC plan and follow-up therapies  PT equipment Rolling walker with 5" wheels  AM-PAC PT "6 Clicks" Daily Activity Outcome Measure  Difficulty turning over in bed (including adjusting bedclothes, sheets and blankets)? 1  Difficulty moving from lying on back to sitting on the side of the bed?  1  Difficulty sitting down on and standing up from a chair with arms (e.g., wheelchair, bedside commode, etc,.)? 1  Help needed moving to and from a bed to chair (including a wheelchair)? 2  Help needed walking in hospital room? 2  Help needed climbing 3-5 steps with a railing?  2  6 Click Score 9  Mobility G Code  CL  Acute Rehab PT Goals  PT Goal Formulation With patient  Time For Goal Achievement 03/28/18  Potential to Achieve Goals Good  PT Time Calculation  PT Start Time (ACUTE ONLY) 1519  PT Stop Time (ACUTE ONLY) 1535  PT Time Calculation (min) (ACUTE ONLY) 16 min  PT General Charges  $$ ACUTE PT VISIT 1 Visit  PT Treatments  $Therapeutic Exercise 8-22 mins

## 2018-03-22 LAB — CBC
HCT: 35.7 % — ABNORMAL LOW (ref 36.0–46.0)
Hemoglobin: 11.4 g/dL — ABNORMAL LOW (ref 12.0–15.0)
MCH: 29.8 pg (ref 26.0–34.0)
MCHC: 31.9 g/dL (ref 30.0–36.0)
MCV: 93.2 fL (ref 78.0–100.0)
Platelets: 139 10*3/uL — ABNORMAL LOW (ref 150–400)
RBC: 3.83 MIL/uL — ABNORMAL LOW (ref 3.87–5.11)
RDW: 13.4 % (ref 11.5–15.5)
WBC: 9.6 10*3/uL (ref 4.0–10.5)

## 2018-03-22 NOTE — Progress Notes (Signed)
    Subjective:  Patient reports pain as moderate.  Denies N/V/CP/SOB. C/o R knee pain.  She has walked with PT.  Objective:   VITALS:   Vitals:   03/21/18 1000 03/21/18 1445 03/21/18 2044 03/22/18 0541  BP: (!) 146/48 (!) 139/48 (!) 158/57 (!) 159/66  Pulse: 71 70 77 72  Resp: 16 16 16 16   Temp: 97.8 F (36.6 C) 98.8 F (37.1 C) 99.2 F (37.3 C) 98.7 F (37.1 C)  TempSrc:   Oral Oral  SpO2: 95% 93% 100% 93%  Weight:      Height:        NAD ABD soft Sensation intact distally Intact pulses distally Dorsiflexion/Plantar flexion intact Incision: dressing C/D/I Compartment soft HV site c/d/i.   Lab Results  Component Value Date   WBC 9.6 03/22/2018   HGB 11.4 (L) 03/22/2018   HCT 35.7 (L) 03/22/2018   MCV 93.2 03/22/2018   PLT 139 (L) 03/22/2018   BMET    Component Value Date/Time   NA 139 03/21/2018 0546   NA 142 01/20/2014 1303   K 4.3 03/21/2018 0546   K 4.4 01/20/2014 1303   CL 101 03/21/2018 0546   CL 106 01/20/2014 1303   CO2 28 03/21/2018 0546   CO2 33 (H) 01/20/2014 1303   GLUCOSE 141 (H) 03/21/2018 0546   GLUCOSE 129 (H) 01/20/2014 1303   BUN 16 03/21/2018 0546   BUN 13 01/20/2014 1303   CREATININE 0.95 03/21/2018 0546   CREATININE 0.72 01/20/2014 1303   CALCIUM 9.3 03/21/2018 0546   CALCIUM 9.6 01/20/2014 1303   GFRNONAA 58 (L) 03/21/2018 0546   GFRNONAA >60 01/20/2014 1303   GFRAA >60 03/21/2018 0546   GFRAA >60 01/20/2014 1303     Assessment/Plan: 2 Days Post-Op   Active Problems:   Primary osteoarthritis of right knee   S/P knee replacement  ICE to right knee WBAT with walker DVT ppx: xarelto, SCDs, TEDS PO pain control PT/OT Dispo: likley dc home tomorrow, will follow progress with PT   Autumn Price Autumn Price 03/22/2018, 10:32 AM   Samson FredericBrian Swinteck, MD Cell [phone]

## 2018-03-22 NOTE — Progress Notes (Signed)
PT NOTE--pm session     03/22/18 1400  PT Visit Information  Last PT Received On 04/07/19__Pt is progressing slowly, she is requiring heavy mod assist for transfers and amb at most 5760' with 3+ rest breaks; she had a recent fall at home prior to surgery--for these reasons pt may need SNF placement post acute; will continue to follow  Assistance Needed +1  History of Present Illness s/p R TKA; PMHx: HTN  Subjective Data  Patient Stated Goal home and have less knee pain  Precautions  Precautions Knee  Required Braces or Orthoses Knee Immobilizer - Right  Restrictions  Weight Bearing Restrictions No  Other Position/Activity Restrictions WBAT  Pain Assessment  Pain Assessment 0-10  Pain Score 5  Pain Location L knee  Pain Descriptors / Indicators Sore;Grimacing;Guarding;Cramping  Pain Intervention(s) Monitored during session;Premedicated before session;Repositioned;Ice applied  Cognition  Arousal/Alertness Awake/alert  Behavior During Therapy WFL for tasks assessed/performed  Overall Cognitive Status Within Functional Limits for tasks assessed  Bed Mobility  Overal bed mobility Needs Assistance  Bed Mobility Supine to Sit  Supine to sit Min assist  General bed mobility comments assist with RLE  Transfers  Overall transfer level Needs assistance  Equipment used Rolling walker (2 wheeled)  Transfers Sit to/from Stand;Stand Pivot Transfers  Sit to Stand Mod assist  Stand pivot transfers Mod assist  General transfer comment cues for hand placement, wt shift, & sequencing for stand pivot incr time ( pt prefers to pull on RW)  Ambulation/Gait  Ambulation/Gait assistance Min assist  Ambulation Distance (Feet) 60 Feet  Assistive device Rolling walker (2 wheeled)  Gait Pattern/deviations Step-to pattern;Antalgic  General Gait Details cues for technique and sequence, pt required 3 standing rests, leaning on walker, dyspneic--cues to correct (pivotal steps only d/t pain)  Total Joint  Exercises  Ankle Circles/Pumps AROM;Both;10 reps  Quad Sets AROM;Both;10 reps  Heel Slides AAROM;Right;10 reps  Hip ABduction/ADduction AAROM;Right;10 reps  Straight Leg Raises AAROM;Right;10 reps  Goniometric ROM 10* to 65* AAROM knee flexion  Towel Squeeze AROM;Both;10 reps  PT - End of Session  Equipment Utilized During Treatment Gait belt;Right knee immobilizer  Activity Tolerance Patient tolerated treatment well  Patient left in chair;with call bell/phone within reach   PT - Assessment/Plan  PT Plan Current plan remains appropriate  PT Visit Diagnosis Difficulty in walking, not elsewhere classified (R26.2)  PT Frequency (ACUTE ONLY) 7X/week  Follow Up Recommendations Follow surgeon's recommendation for DC plan and follow-up therapies;Home health PT;Supervision for mobility/OOB vs SNF  PT equipment Rolling walker with 5" wheels  AM-PAC PT "6 Clicks" Daily Activity Outcome Measure  Difficulty turning over in bed (including adjusting bedclothes, sheets and blankets)? 1  Difficulty moving from lying on back to sitting on the side of the bed?  1  Difficulty sitting down on and standing up from a chair with arms (e.g., wheelchair, bedside commode, etc,.)? 1  Help needed moving to and from a bed to chair (including a wheelchair)? 2  Help needed walking in hospital room? 2  Help needed climbing 3-5 steps with a railing?  2  6 Click Score 9  Mobility G Code  CL  Acute Rehab PT Goals  PT Goal Formulation With patient  Time For Goal Achievement 03/28/18  Potential to Achieve Goals Good  PT Time Calculation  PT Start Time (ACUTE ONLY) 1347  PT Stop Time (ACUTE ONLY) 1415  PT Time Calculation (min) (ACUTE ONLY) 28 min  PT General Charges  $$ ACUTE PT VISIT  1 Visit  PT Treatments  $Gait Training 8-22 mins  $Therapeutic Exercise 8-22 mins

## 2018-03-22 NOTE — Progress Notes (Signed)
Physical Therapy Treatment Patient Details Name: Autumn SewerWanda C Price MRN: [id number] DOB: [birth year] Today's Date: 03/22/2018    History of Present Illness s/p R TKA; PMHx: HTN    PT Comments    Pt progressing slowly, limited by pain this session--received meds during PT; will see again in pm; will put in order for OT as well since pt lives alone (will have support of niece at d/c )  Follow Up Recommendations  Follow surgeon's recommendation for DC plan and follow-up therapies;Home health PT;Supervision for mobility/OOB     Equipment Recommendations  Rolling walker with 5" wheels    Recommendations for Other Services       Precautions / Restrictions Precautions Precautions: Knee Required Braces or Orthoses: Knee Immobilizer - Right Restrictions Weight Bearing Restrictions: No Other Position/Activity Restrictions: WBAT    Mobility  Bed Mobility Overal bed mobility: Needs Assistance Bed Mobility: Supine to Sit     Supine to sit: Min assist     General bed mobility comments: assist with RLE  Transfers Overall transfer level: Needs assistance Equipment used: Rolling walker (2 wheeled) Transfers: Sit to/from UGI CorporationStand;Stand Pivot Transfers Sit to Stand: Min assist;+2 physical assistance;+2 safety/equipment;From elevated surface Stand pivot transfers: Min assist;+2 physical assistance;+2 safety/equipment       General transfer comment: cues for hand placement, wt shift, & sequencing for stand pivot incr time ( pt prefers to pull on RW)  Ambulation/Gait             General Gait Details: (pivotal steps only d/t pain)   Stairs            Wheelchair Mobility    Modified Rankin (Stroke Patients Only)       Balance                                            Cognition Arousal/Alertness: Awake/alert Behavior During Therapy: WFL for tasks assessed/performed Overall Cognitive Status: Within Functional Limits for tasks assessed                                         Exercises Total Joint Exercises Ankle Circles/Pumps: AROM;Both;10 reps    General Comments        Pertinent Vitals/Pain Pain Assessment: 0-10 Pain Score: 9  Pain Location: L knee Pain Descriptors / Indicators: Sore;Grimacing;Guarding;Cramping Pain Intervention(s): Limited activity within patient's tolerance;Monitored during session;Patient requesting pain meds-RN notified;RN gave pain meds during session;Ice applied;Repositioned    Home Living                      Prior Function            PT Goals (current goals can now be found in the care plan section) Acute Rehab PT Goals Patient Stated Goal: home and have less knee pain PT Goal Formulation: With patient Time For Goal Achievement: 03/28/18 Potential to Achieve Goals: Good Progress towards PT goals: Progressing toward goals    Frequency    7X/week      PT Plan Current plan remains appropriate    Co-evaluation              AM-PAC PT "6 Clicks" Daily Activity  Outcome Measure  Difficulty turning over in bed (including adjusting bedclothes, sheets and blankets)?: Unable Difficulty moving  from lying on back to sitting on the side of the bed? : Unable Difficulty sitting down on and standing up from a chair with arms (e.g., wheelchair, bedside commode, etc,.)?: Unable Help needed moving to and from a bed to chair (including a wheelchair)?: A Lot Help needed walking in hospital room?: A Lot Help needed climbing 3-5 steps with a railing? : A Lot 6 Click Score: 9    End of Session Equipment Utilized During Treatment: Gait belt;Right knee immobilizer Activity Tolerance: Patient tolerated treatment well Patient left: in chair;with call bell/phone within reach   PT Visit Diagnosis: Difficulty in walking, not elsewhere classified (R26.2)     Time: 4098-1191 PT Time Calculation (min) (ACUTE ONLY): 18 min  Charges:  $Therapeutic Activity: 8-22 mins                     G CodesDrucilla Chalet, PT Pager: [phone] 03/22/2018    Drucilla Chalet 03/22/2018, 12:12 PM

## 2018-03-22 NOTE — Care Management Note (Addendum)
Case Management Note  Patient Details  Name: Autumn Price MRN: [id number] Date of Birth: 1941-11-27  Subjective/Objective:  S/p right TKA                  Action/Plan: NCM spoke to pt at bedside. She had 3n1 bedside commode in room for home. [facility] Pt agreeable to [facility] for HHPT. (preoperatively arranged from surgeon's office)  Pt will discuss with surgeon on tomorrow SNF vs HH.   Expected Discharge Date:  03/20/18               Expected Discharge Plan:  Home w Home Health Services  In-House Referral:  NA  Discharge planning Services  CM Consult  Post Acute Care Choice:  Home Health Choice offered to:  Patient  DME Arranged:  3-N-1 DME Agency:  [facility]  HH [facility]  [facility]  Status of Service:  Completed, signed off  If discussed at Long Length of Stay Meetings, dates discussed:    Additional Comments:  Elliot CousinShavis, Akul Leggette Ellen, RN 03/22/2018, 7:10 PM

## 2018-03-23 ENCOUNTER — Encounter (HOSPITAL_COMMUNITY): Payer: Self-pay | Admitting: Specialist

## 2018-03-23 LAB — CBC
HCT: 36.3 % (ref 36.0–46.0)
Hemoglobin: 11.3 g/dL — ABNORMAL LOW (ref 12.0–15.0)
MCH: 29.2 pg (ref 26.0–34.0)
MCHC: 31.1 g/dL (ref 30.0–36.0)
MCV: 93.8 fL (ref 78.0–100.0)
Platelets: 131 10*3/uL — ABNORMAL LOW (ref 150–400)
RBC: 3.87 MIL/uL (ref 3.87–5.11)
RDW: 13.7 % (ref 11.5–15.5)
WBC: 7.1 10*3/uL (ref 4.0–10.5)

## 2018-03-23 MED ORDER — OXYCODONE HCL 5 MG PO TABS: 5.0000 mg | ORAL_TABLET | ORAL | 0 refills | Status: DC | PRN

## 2018-03-23 MED ORDER — BACLOFEN 10 MG PO TABS: 10.0000 mg | ORAL_TABLET | Freq: Three times a day (TID) | ORAL | 1 refills | Status: DC

## 2018-03-23 MED ORDER — DOCUSATE SODIUM 100 MG PO CAPS: 100.0000 mg | ORAL_CAPSULE | Freq: Two times a day (BID) | ORAL | 0 refills | Status: DC

## 2018-03-23 MED ORDER — POLYETHYLENE GLYCOL 3350 17 G PO PACK: 17.0000 g | PACK | Freq: Every day | ORAL | 0 refills | Status: DC | PRN

## 2018-03-23 MED ORDER — FERROUS SULFATE 325 (65 FE) MG PO TABS: 325.0000 mg | ORAL_TABLET | Freq: Three times a day (TID) | ORAL | 3 refills | Status: DC

## 2018-03-23 MED ORDER — RIVAROXABAN 10 MG PO TABS: 10.0000 mg | ORAL_TABLET | Freq: Every day | ORAL | Status: DC

## 2018-03-23 NOTE — Progress Notes (Signed)
Subjective: 3 Days Post-Op Procedure(s) (LRB): RIGHT TOTAL KNEE ARTHROPLASTY (Right) Patient reports pain as 6 on 0-10 scale.  Lots of pain some burning. Walked in hall then sat in chair and could not get up.  Objective: Vital signs in last 24 hours: Temp:  [98.2 F (36.8 C)-99.4 F (37.4 C)] 98.2 F (36.8 C) (04/08 1400) Pulse Rate:  [66-73] 66 (04/08 1400) Resp:  [20] 20 (04/08 1400) BP: (143-145)/(50-52) 143/52 (04/08 0510) SpO2:  [90 %-94 %] 92 % (04/08 1400)  Intake/Output from previous day: 04/07 0701 - 04/08 0700 In: 720 [P.O.:720] Out: 1400 [Urine:1400] Intake/Output this shift: Total I/O In: 4958.8 [P.O.:480; I.V.:4478.8] Out: -   Recent Labs    03/21/18 0546 03/22/18 0440 03/23/18 0445  HGB 12.0 11.4* 11.3*   Recent Labs    03/22/18 0440 03/23/18 0445  WBC 9.6 7.1  RBC 3.83* 3.87  HCT 35.7* 36.3  PLT 139* 131*   Recent Labs    03/21/18 0546  NA 139  K 4.3  CL 101  CO2 28  BUN 16  CREATININE 0.95  GLUCOSE 141*  CALCIUM 9.3   No results for input(s): LABPT, INR in the last 72 hours.  Sensation intact distally Intact pulses distally Dorsiflexion/Plantar flexion intact Incision: dressing C/D/I Compartment soft  Anticipated LOS equal to or greater than 2 midnights due to - Age 75 and older with one or more of the following:  - Obesity  - Expected need for hospital services (PT, OT, Nursing) required for safe  discharge  - Anticipated need for postoperative skilled nursing care or inpatient rehab  - Active co-morbidities:  OR   - Unanticipated findings during/Post Surgery: Slow post-op progression: GI, pain control, mobility  - Patient is a high risk of re-admission due to: Barriers to post-acute care (logistical, no family support in home)   Assessment/Plan: 3 Days Post-Op Procedure(s) (LRB): RIGHT TOTAL KNEE ARTHROPLASTY (Right) Up with therapy Discharge to SNF when available. Unable to make it at home at this time. All questions  answered for patient.    Marko Skalski ANDREW 03/23/2018, 5:04 PM

## 2018-03-23 NOTE — Care Management Important Message (Signed)
Important Message  Patient Details  Name: Autumn Price MRN: [id number] Date of Birth: June 29, 1941   Medicare Important Message Given:  Yes    Caren MacadamFuller, Casee Knepp 03/23/2018, 10:05 AMImportant Message  Patient Details  Name: Autumn Price MRN: [id number] Date of Birth: June 29, 1941   Medicare Important Message Given:  Yes    Caren MacadamFuller, Sami Roes 03/23/2018, 10:05 AM

## 2018-03-23 NOTE — Progress Notes (Signed)
Pt unable to get out of chair with 2 assist, Called [facility] to speak with MD on Call, was told they would be paged, awaiting response

## 2018-03-23 NOTE — Progress Notes (Signed)
Physical Therapy Treatment Patient Details Name: Autumn Price MRN: [id number] DOB: 1941/10/10 Today's Date: 03/23/2018    History of Present Illness s/p R TKA; PMHx: HTN    PT Comments    POD # 3 Had checked 2 times earlier before pt was ready.  OOB in recliner.  Attempted to get pt out of recliner 4 times even with + 2 assist.  Pt became emotional and discouraged with concerns about going home.  Pt's not progressing with her mobility enough to warrant a safe D/C to home.  Pt lives alone and has multiple stairs to enter, which she has not been able to attempt yet.   Follow Up Recommendations  Follow surgeon's recommendation for DC plan and follow-up therapies;Home health PT;Supervision for mobility/OOB;SNF     Equipment Recommendations  Rolling walker with 5" wheels    Recommendations for Other Services       Precautions / Restrictions Precautions Precautions: Knee Precaution Comments: instructed on KI use for amb Required Braces or Orthoses: Knee Immobilizer - Right Restrictions Weight Bearing Restrictions: No Other Position/Activity Restrictions: WBAT    Mobility  Bed Mobility               General bed mobility comments: OOB in recliner   Transfers Overall transfer level: Needs assistance Equipment used: Rolling walker (2 wheeled) Transfers: Sit to/from Stand Sit to Stand: +2 physical assistance;+2 safety/equipment;Total assist         General transfer comment: attempted x 4 with and without brace on.  Pt was unable to rise from recliner.  Pt was able earlier she said but now with increased pain ans fatigue.  Ambulation/Gait                 Stairs            Wheelchair Mobility    Modified Rankin (Stroke Patients Only)       Balance                                            Cognition Arousal/Alertness: Awake/alert Behavior During Therapy: WFL for tasks assessed/performed Overall Cognitive Status: Within  Functional Limits for tasks assessed                                        Exercises   Total Knee Replacement TE's 10 reps B LE ankle pumps 10 reps towel squeezes 10 reps knee presses 10 reps heel slides  10 reps SLR's 10 reps ABD Followed by ICE     General Comments        Pertinent Vitals/Pain Pain Assessment: Faces Faces Pain Scale: Hurts little more Pain Location: L knee Pain Descriptors / Indicators: Discomfort;Grimacing;Operative site guarding Pain Intervention(s): Monitored during session;Repositioned;Ice applied    Home Living                      Prior Function            PT Goals (current goals can now be found in the care plan section) Progress towards PT goals: Progressing toward goals    Frequency    7X/week      PT Plan Current plan remains appropriate    Co-evaluation  AM-PAC PT "6 Clicks" Daily Activity  Outcome Measure  Difficulty turning over in bed (including adjusting bedclothes, sheets and blankets)?: Unable Difficulty moving from lying on back to sitting on the side of the bed? : Unable Difficulty sitting down on and standing up from a chair with arms (e.g., wheelchair, bedside commode, etc,.)?: Unable Help needed moving to and from a bed to chair (including a wheelchair)?: A Lot Help needed walking in hospital room?: A Lot Help needed climbing 3-5 steps with a railing? : A Lot 6 Click Score: 9    End of Session Equipment Utilized During Treatment: Gait belt Activity Tolerance: Other (comment) Patient left: in chair Nurse Communication: Mobility status PT Visit Diagnosis: Difficulty in walking, not elsewhere classified (R26.2)     Time: 1310-1335 PT Time Calculation (min) (ACUTE ONLY): 25 min  Charges:  $Therapeutic Exercise: 8-22 mins $Therapeutic Activity: 8-22 mins                    G Codes:       Felecia ShellingLori Emmory Solivan  PTA [facility]  Acute  Rehab Pager      [phone]

## 2018-03-23 NOTE — Progress Notes (Signed)
Patient ID: Autumn Price, female   DOB: 1941-02-04, 77 y.o.   MRN: [id number] Subjective: 3 Days Post-Op Procedure(s) (LRB): RIGHT TOTAL KNEE ARTHROPLASTY (Right)    Patient reports pain as mild to moderate.  Challenged with moving around in hospital bed  Objective:   VITALS:   Vitals:   03/22/18 2035 03/23/18 0510  BP: (!) 145/50 (!) 143/52  Pulse: 73 71  Resp: 20 20  Temp: 99.4 F (37.4 C) 98.7 F (37.1 C)  SpO2: 94% 90%    Neurovascular intact Incision: dressing C/D/I - right knee  LABS Recent Labs    03/21/18 0546 03/22/18 0440 03/23/18 0445  HGB 12.0 11.4* 11.3*  HCT 37.7 35.7* 36.3  WBC 8.8 9.6 7.1  PLT 147* 139* 131*    Recent Labs    03/21/18 0546  NA 139  K 4.3  BUN 16  CREATININE 0.95  GLUCOSE 141*    No results for input(s): LABPT, INR in the last 72 hours.   Assessment/Plan: 3 Days Post-Op Procedure(s) (LRB): RIGHT TOTAL KNEE ARTHROPLASTY (Right)   Up with therapy  Home today after therapy Home PT initially  Reviewed goals Reviewed exercises and frequency focus on BOTH extension and flexion Practice ADLs, etc

## 2018-03-23 NOTE — Evaluation (Signed)
Occupational Therapy Evaluation Patient Details Name: Autumn SewerWanda C Goodness MRN: [id number] DOB: 04/21/1941 Today's Date: 03/23/2018    History of Present Illness s/p R TKA; PMHx: HTN   Clinical Impression   Pt is s/p TKA resulting in the deficits listed below (see OT Problem List).  Pt will benefit from skilled OT to increase their safety and independence with ADL and functional mobility for ADL to facilitate discharge to venue listed below.        Follow Up Recommendations  Home health OT;Supervision/Assistance - 24 hour    Equipment Recommendations  3 in 1 bedside commode       Precautions / Restrictions Precautions Precautions: Knee Required Braces or Orthoses: Knee Immobilizer - Right Restrictions Weight Bearing Restrictions: No Other Position/Activity Restrictions: WBAT      Mobility Bed Mobility Overal bed mobility: Needs Assistance Bed Mobility: Supine to Sit     Supine to sit: Min assist     General bed mobility comments: assist with RLE  Transfers Overall transfer level: Needs assistance Equipment used: Rolling walker (2 wheeled) Transfers: Sit to/from [employer] CorporationStand;Stand Pivot Transfers Sit to Stand: Mod assist;Min assist Stand pivot transfers: Min assist       General transfer comment: cues for hand placement, wt shift, & sequencing for stand pivot incr time ( pt prefers to pull on RW)        ADL either performed or assessed with clinical judgement   ADL Overall ADL's : Needs assistance/impaired Eating/Feeding: Set up;Sitting   Grooming: Sitting;Set up   Upper Body Bathing: Set up;Sitting   Lower Body Bathing: Moderate assistance;Sit to/from stand   Upper Body Dressing : Set up;Sitting   Lower Body Dressing: Moderate assistance;Sit to/from stand;Cueing for safety;Cueing for sequencing   Toilet Transfer: Minimal assistance;Comfort height toilet;RW;Stand-pivot;Ambulation;Cueing for sequencing;Cueing for safety Toilet Transfer Details (indicate cue type  and reason): increased time Toileting- Clothing Manipulation and Hygiene: Minimal assistance;Sit to/from stand;Cueing for safety;Cueing for sequencing;Cueing for compensatory techniques       Functional mobility during ADLs: Minimal assistance;Rolling walker General ADL Comments: increased time for all movements     Vision Patient Visual Report: No change from baseline              Pertinent Vitals/Pain Pain Score: 3  Pain Location: L knee Pain Descriptors / Indicators: Discomfort Pain Intervention(s): Limited activity within patient's tolerance;Monitored during session;Repositioned     Hand Dominance     Extremity/Trunk Assessment Upper Extremity Assessment Upper Extremity Assessment: Generalized weakness           Communication Communication Communication: No difficulties   Cognition Arousal/Alertness: Awake/alert Behavior During Therapy: WFL for tasks assessed/performed Overall Cognitive Status: Within Functional Limits for tasks assessed                                                Home Living Family/patient expects to be discharged to:: Private residence Living Arrangements: Alone Available Help at Discharge: Family Type of Home: Mobile home Home Access: Stairs to enter Secretary/administratorntrance Stairs-Number of Steps: 4 Entrance Stairs-Rails: Right;Left;Can reach both Home Layout: One level     Bathroom Shower/Tub: Chief Strategy OfficerTub/shower unit   Bathroom Toilet: Standard     Home Equipment: None          Prior Functioning/Environment Level of Independence: Independent  OT Problem List: Decreased strength;Decreased activity tolerance;Decreased safety awareness      OT Treatment/Interventions: Self-care/ADL training;Patient/family education;DME and/or AE instruction    OT Goals(Current goals can be found in the care plan section) Acute Rehab OT Goals Patient Stated Goal: home and have less knee pain OT Goal Formulation: With  patient Time For Goal Achievement: 03/23/18  OT Frequency: Min 2X/week              AM-PAC PT "6 Clicks" Daily Activity     Outcome Measure Help from another person eating meals?: None Help from another person taking care of personal grooming?: A Little Help from another person toileting, which includes using toliet, bedpan, or urinal?: A Little Help from another person bathing (including washing, rinsing, drying)?: A Lot Help from another person to put on and taking off regular upper body clothing?: A Little Help from another person to put on and taking off regular lower body clothing?: A Lot 6 Click Score: 17   End of Session Equipment Utilized During Treatment: Rolling walker Nurse Communication: Mobility status  Activity Tolerance: Patient tolerated treatment well Patient left: in chair;with call bell/phone within reach;with chair alarm set  OT Visit Diagnosis: Unsteadiness on feet (R26.81);History of falling (Z91.81);Pain Pain - Right/Left: Right Pain - part of body: Knee                Time: 1135-1155 OT Time Calculation (min): 20 min Charges:  OT General Charges $OT Visit: 1 Visit OT Evaluation $OT Eval Moderate Complexity: 1 Mod G-Codes:     Lise Auer, OT [phone]  Einar Crow D 03/23/2018, 12:54 PM

## 2018-03-24 MED ORDER — RIVAROXABAN 10 MG PO TABS: 10.0000 mg | ORAL_TABLET | Freq: Every day | ORAL | 0 refills | Status: DC

## 2018-03-24 MED ORDER — BACLOFEN 10 MG PO TABS: 10.0000 mg | ORAL_TABLET | Freq: Three times a day (TID) | ORAL | 1 refills | Status: DC | PRN

## 2018-03-24 MED ORDER — HYDROMORPHONE HCL 2 MG PO TABS: 2.0000 mg | ORAL_TABLET | ORAL | 0 refills | Status: DC | PRN

## 2018-03-24 MED ORDER — HYDROMORPHONE HCL 2 MG PO TABS
4.0000 mg | ORAL_TABLET | ORAL | Status: DC | PRN
  Administered 2018-03-24 – 2018-03-25 (×4): 4 mg via ORAL
  Filled 2018-03-24 (×4): qty 2

## 2018-03-24 MED ORDER — HYDROMORPHONE HCL 2 MG PO TABS: 2.0000 mg | ORAL_TABLET | ORAL | Status: DC | PRN

## 2018-03-24 NOTE — NC FL2 (Signed)
[place] MEDICAID FL2 LEVEL OF CARE SCREENING TOOL     IDENTIFICATION  Patient Name: Autumn Price Birthdate: 1941/12/16 Sex: female Admission Date (Current Location): 03/20/2018  [facility] [place]   [facility] Address[facility]  [facility],  [address], [place] [id number]      Provider Number: [id number]  Attending Physician Name and Address:  Eugenia Mcalpineollins, Robert, MD  Relative Name and Phone Number:       Current Level of Care: Hospital Recommended Level of Care: Skilled Nursing Facility Prior Approval Number:    Date Approved/Denied:   PASRR Number: Pending  Discharge Plan:      Current Diagnoses: Patient Active Problem List   Diagnosis Date Noted  . Primary osteoarthritis of right knee 03/20/2018  . S/P knee replacement 03/20/2018  . Pre-op evaluation 02/11/2018  . Osteoarthritis of both knees 08/30/2016  . Advance directive discussed with patient 08/04/2015  . Mood disorder (HCC) 06/10/2014  . BMI 40.0-44.9, adult (HCC) 05/22/2012  . Routine general medical examination at a health care facility 03/19/2011  . Urge incontinence 03/19/2011  . Essential hypertension, benign 08/21/2010  . Chronic venous insufficiency 08/21/2010  . Osteoarthritis, multiple sites 08/21/2010    Orientation RESPIRATION BLADDER Height & Weight     Self, Time, Situation, Place  Normal Continent Weight: 244 lb 6 oz (110.8 kg) Height:  5\' 5"  (165.1 cm)  BEHAVIORAL SYMPTOMS/MOOD NEUROLOGICAL BOWEL NUTRITION STATUS      Continent Diet(Regular)  AMBULATORY STATUS COMMUNICATION OF NEEDS Skin     Verbally (Surgical Incision-Right Knee)                       Personal Care Assistance Level of Assistance  Bathing, Feeding Bathing Assistance: Limited assistance Feeding assistance: Independent Dressing Assistance: Limited assistance     Functional Limitations Info  Sight, Hearing, Speech Sight Info: Adequate Hearing Info: Adequate Speech Info:  Adequate    SPECIAL CARE FACTORS FREQUENCY  PT (By licensed PT), OT (By licensed OT)     PT Frequency: 7x/week OT Frequency: 7x/week            Contractures Contractures Info: Not present    Additional Factors Info  Code Status, Allergies, Psychotropic Code Status Info: Fullcode Allergies Info: Allergies: No Known Allergies Psychotropic Info: Zoloft         Current Medications (03/24/2018):  This is the current hospital active medication list Current Facility-Administered Medications  Medication Dose Route Frequency Provider Last Rate Last Dose  . 0.9 %  sodium chloride infusion   Intravenous Continuous Stilwell, Bryson L, PA-C      . acetaminophen (TYLENOL) tablet 325-650 mg  325-650 mg Oral Q6H PRN Stilwell, Bryson L, PA-C      . alum & mag hydroxide-simeth (MAALOX/MYLANTA) 200-200-20 MG/5ML suspension 30 mL  30 mL Oral Q4H PRN Stilwell, Bryson L, PA-C      . amLODipine (NORVASC) tablet 5 mg  5 mg Oral Daily Stilwell, Bryson L, PA-C   5 mg at 03/24/18 0939  . bisacodyl (DULCOLAX) EC tablet 5 mg  5 mg Oral Daily PRN Stilwell, Bryson L, PA-C      . chlorhexidine (HIBICLENS) 4 % liquid 4 application  60 mL Topical Once Stilwell, Bryson L, PA-C      . diphenhydrAMINE (BENADRYL) 12.5 MG/5ML elixir 12.5-25 mg  12.5-25 mg Oral Q4H PRN Stilwell, Bryson L, PA-C      . docusate sodium (COLACE) capsule 100 mg  100 mg  Oral BID Stilwell, Bryson L, PA-C   100 mg at 03/24/18 0940  . enoxaparin (LOVENOX) injection 30 mg  30 mg Subcutaneous Q12H Stilwell, Bryson L, PA-C   30 mg at 03/24/18 0816  . fentaNYL (SUBLIMAZE) injection 50-100 mcg  50-100 mcg Intravenous UD Stilwell, Bryson L, PA-C   100 mcg at 03/20/18 1306  . ferrous sulfate tablet 325 mg  325 mg Oral TID PC Stilwell, Bryson L, PA-C   325 mg at 03/24/18 0816  . HYDROmorphone (DILAUDID) injection 0.5-1 mg  0.5-1 mg Intravenous Q4H PRN Stilwell, Bryson L, PA-C   1 mg at 03/21/18 1818  . HYDROmorphone (DILAUDID) tablet 4 mg  4 mg Oral  Q4H PRN Stilwell, Bryson L, PA-C      . ketoconazole (NIZORAL) 2 % cream 1 application  1 application Topical BID PRN Stilwell, Bryson L, PA-C      . lactated ringers infusion   Intravenous Continuous Stilwell, Bryson L, PA-C 75 mL/hr at 03/20/18 1110    . magnesium citrate solution 1 Bottle  1 Bottle Oral Once PRN Stilwell, Bryson L, PA-C      . menthol-cetylpyridinium (CEPACOL) lozenge 3 mg  1 lozenge Oral PRN Stilwell, Bryson L, PA-C       Or  . phenol (CHLORASEPTIC) mouth spray 1 spray  1 spray Mouth/Throat PRN Stilwell, Bryson L, PA-C      . methocarbamol (ROBAXIN) tablet 500 mg  500 mg Oral Q6H PRN Stilwell, Bryson L, PA-C   500 mg at 03/24/18 0816   Or  . methocarbamol (ROBAXIN) 500 mg in dextrose 5 % 50 mL IVPB  500 mg Intravenous Q6H PRN Stilwell, Bryson L, PA-C 110 mL/hr at 03/20/18 1626 500 mg at 03/20/18 1626  . metoCLOPramide (REGLAN) tablet 5-10 mg  5-10 mg Oral Q8H PRN Stilwell, Bryson L, PA-C       Or  . metoCLOPramide (REGLAN) injection 5-10 mg  5-10 mg Intravenous Q8H PRN Stilwell, Bryson L, PA-C      . midazolam (VERSED) injection 1-2 mg  1-2 mg Intravenous UD Stilwell, Bryson L, PA-C   1 mg at 03/20/18 1306  . ondansetron (ZOFRAN) tablet 4 mg  4 mg Oral Q6H PRN Stilwell, Bryson L, PA-C       Or  . ondansetron (ZOFRAN) injection 4 mg  4 mg Intravenous Q6H PRN Stilwell, Bryson L, PA-C      . polyethylene glycol (MIRALAX / GLYCOLAX) packet 17 g  17 g Oral Daily PRN Stilwell, Bryson L, PA-C      . sertraline (ZOLOFT) tablet 75 mg  75 mg Oral Daily Stilwell, Bryson L, PA-C   75 mg at 03/24/18 0940     Discharge Medications: Please see discharge summary for a list of discharge medications.  Relevant Imaging Results:  Relevant Lab Results:   Additional Information ssn:[id number]  Clearance Coots, LCSW

## 2018-03-24 NOTE — Progress Notes (Signed)
5:30pm- Patient PASRR has not been received. Patient unable to d/c to facility until number is received. Patient informed at bedside. Nurse informed.   ED social worker will continue to check for PASRR number and will call nurse/ arrange for transport to the facility if received. Nurse given number to call report to the building.   Vivi BarrackNicole Stephonie Wilcoxen, Theresia MajorsLCSWA, MSW Clinical Social Worker  [phone] 03/24/2018  5:33 PM

## 2018-03-24 NOTE — Clinical Social Work Placement (Addendum)
D/C Summary sent Waiting on state PASRR, all requested clinicals sent.   CLINICAL SOCIAL WORK PLACEMENT  NOTE  Date:  03/24/2018  Patient Details  Name: Autumn Price MRN: [id number] Date of Birth: [birth year]  Clinical Social Work is seeking post-discharge placement for this patient at the Skilled  Nursing Facility level of care (*CSW will initial, date and re-position this form in  chart as items are completed):  Yes   Patient/family provided with [place] Clinical Social Work Department's list of facilities offering this level of care within the geographic area requested by the patient (or if unable, by the patient's family).  Yes   Patient/family informed of their freedom to choose among providers that offer the needed level of care, that participate in Medicare, Medicaid or managed care program needed by the patient, have an available bed and are willing to accept the patient.  Yes   Patient/family informed of [facility]'s ownership interest in [facility] and [facility], as well as of the fact that they are under no obligation to receive care at these facilities.  PASRR submitted to EDS on       PASRR number received on       Existing PASRR number confirmed on       FL2 transmitted to all facilities in geographic area requested by pt/family on       FL2 transmitted to all facilities within larger geographic area on 03/24/18     Patient informed that his/her managed care company has contracts with or will negotiate with certain facilities, including the following:  [facility]     Yes   Patient/family informed of bed offers received.  Patient [facility]     Physician recommends and patient chooses bed at      Patient to be transferred to [facility] on 03/24/18.  Patient to be transferred to facility by [facility]      Patient family notified on 03/24/18 of transfer.  Name of family member notified:  Son: Dorene SorrowJerry       PHYSICIAN Please prepare priority discharge summary, including medications, Please sign FL2     Additional Comment:    _______________________________________________ Clearance CootsNicole A Jakyren Fluegge, LCSW 03/24/2018, 1:53 PM

## 2018-03-24 NOTE — Clinical Social Work Note (Signed)
Clinical Social Work Assessment  Patient Details  Name: Autumn Price MRN: [id number] Date of Birth: 19-May-1941  Date of referral:  03/24/18               Reason for consult:  Facility Placement                Permission sought to share information with:  Family Supports, Customer service manager Permission granted to share information::  Yes, Verbal Permission Granted  Name::      Alcide Goodness   Agency::  SNF  Relationship::  Adult Children   Contact Information:    [phone]  Housing/Transportation Living arrangements for the past 2 months:  Mobile Home Source of Information:  Patient Patient Interpreter Needed:  None Criminal Activity/Legal Involvement Pertinent to Current Situation/Hospitalization:  No - Comment as needed Significant Relationships:  Adult Children Lives with:  Self Do you feel safe going back to the place where you live?  Yes Need for family participation in patient care:  Yes (Dependent with mobility)  Care giving concerns:   Patient presented for surgery. Patient will need SNF placement for short rehab.   Social Worker assessment / plan:  CSW met with patient at bedside, to discuss discharge plan to SNF. Patient reports she lives alone and has not been progressing as quickly with physical therapy therefore agreeable to short rehab at [facility]. She lives home alone and does not feel safe ambulating without support at this time. Patient reports this is her first time going to a SNF. She prefers a facility close to her home in [place], [place]. [facility] explained SNF process and insurance authorization process. Patient reports understanding process. CSW provided patient with list of bed offers, Patient agreeable to [facility].   Patient will transport by [facility].   Plan: SNF  Employment status:  Retired Nurse, adult PT Recommendations:  [place] / Referral to community resources:  [place]  Patient/Family's Response to care: Patient appreciative of CSW services.  Patient adult children involved in patient care but not present during assessment.   Patient/Family's Understanding of and Emotional Response to Diagnosis, Current Treatment, and Prognosis:  "My knee was bone on bone and the pain was intolerable." Patient has good understanding of her diagnosis and follow up care at [facility]. Patient reports feeling hopeful rehab will go well.   Emotional Assessment Appearance:  Appears stated age Attitude/Demeanor/Rapport:    Affect (typically observed):  Accepting, Pleasant Orientation:  Oriented to Self, Oriented to Place, Oriented to  Time, Oriented to Situation Alcohol / Substance use:  Not Applicable Psych involvement (Current and /or in the community):  No (Comment)  Discharge Needs  Concerns to be addressed:  Discharge Planning Concerns Readmission within the last 30 days:  No Current discharge risk:  Dependent with Mobility Barriers to Discharge:  Ship broker, [place] ([place])   Lia Hopping, LCSW 03/24/2018, 12:26 PM

## 2018-03-24 NOTE — Discharge Summary (Signed)
Physician Discharge Summary  Patient ID: Autumn Price MRN: [id number] DOB/AGE: 1940/12/17 77 y.o.  Admit date: 03/20/2018 Discharge date: 03/24/2018  Admission Diagnoses: right knee primary OA  Discharge Diagnoses:  Active Problems:   Primary osteoarthritis of right knee   S/P knee replacement   Discharged Condition: goos  Hospital Course:  Autumn Price is a 77 y.o. who was admitted to [facility]. They were brought to the operating room on 03/20/2018 and underwent Procedure(s): RIGHT TOTAL KNEE ARTHROPLASTY.  Patient tolerated the procedure well and was later transferred to the recovery room and then to the orthopaedic floor for postoperative care.  They were given PO and IV analgesics for pain control following their surgery.  They were given 24 hours of postoperative antibiotics of  Anti-infectives (From admission, onward)   Start     Dose/Rate Route Frequency Ordered Stop   03/20/18 2000  ceFAZolin (ANCEF) IVPB 2g/100 mL premix     2 g 200 mL/hr over 30 Minutes Intravenous Every 6 hours 03/20/18 1730 03/21/18 0150   03/20/18 1305  ceFAZolin (ANCEF) 2-4 GM/100ML-% IVPB    Note to Pharmacy:  Kym Groom   : cabinet override      03/20/18 1305 03/20/18 1335   03/20/18 1023  ceFAZolin (ANCEF) IVPB 2g/100 mL premix     2 g 200 mL/hr over 30 Minutes Intravenous On call to O.R. 03/20/18 1023 03/20/18 1405     and started on DVT prophylaxis in the form of lovenox.   PT and OT were ordered for total joint protocol.  Discharge planning consulted to help with postop disposition and equipment needs.  Patient had a good night on the evening of surgery and started to get up OOB with therapy on day one.  Hemovac drain was pulled without difficulty.  Continued to work with therapy into day two.  Dressing was with normal limits.  The patient had progressed with therapy and meeting their goals. Patient was seen in rounds and was ready to go home.  Consults: None  Significant Diagnostic  Studies: routine  Treatments: routine  Discharge Exam: Blood pressure (!) 138/58, pulse 80, temperature 98.5 F (36.9 C), temperature source Oral, resp. rate 18, height 5\' 5"  (1.651 m), weight 110.8 kg (244 lb 6 oz), SpO2 (!) 86 %. Well nourished. Alert and oriented x3. RRR, Lungs clear, BS x4. Abdomen soft and non tender. Right Calf soft and non tender. Right knee dressing C/D/I. No DVT signs. Compartment soft. No signs of infection.  Right LE grossly neurovascular intact.  Disposition: Discharge disposition: 03-Skilled Nursing Facility       Discharge Instructions    Call MD / Call 911   Complete by:  As directed    If you experience chest pain or shortness of breath, CALL 911 and be transported to the hospital emergency room.  If you develope a fever above 101 F, pus (white drainage) or increased drainage or redness at the wound, or calf pain, call your surgeon's office.   Change dressing   Complete by:  As directed    Maintain surgical dressing until follow up in the clinic. If the edges start to pull up, may reinforce with tape. If the dressing is no longer working, may remove and cover with gauze and tape, but must keep the area dry and clean.  Call with any questions or concerns.   Constipation Prevention   Complete by:  As directed    Drink plenty of fluids.  Prune juice  may be helpful.  You may use a stool softener, such as Colace (over the counter) 100 mg twice a day.  Use MiraLax (over the counter) for constipation as needed.   Diet - low sodium heart healthy   Complete by:  As directed    Discharge instructions   Complete by:  As directed    .INSTRUCTIONS AFTER JOINT REPLACEMENT   Remove items at home which could result in a fall. This includes throw rugs or furniture in walking pathways ICE to the affected joint every three hours while awake for 30 minutes at a time, for at least the first 3-5 days, and then as needed for pain and swelling.  Continue to use ice for pain  and swelling. You may notice swelling that will progress down to the foot and ankle.  This is normal after surgery.  Elevate your leg when you are not up walking on it.   Continue to use the breathing machine you got in the hospital (incentive spirometer) which will help keep your temperature down.  It is common for your temperature to cycle up and down following surgery, especially at night when you are not up moving around and exerting yourself.  The breathing machine keeps your lungs expanded and your temperature down.   DIET:  As you were doing prior to hospitalization, we recommend a well-balanced diet.  DRESSING / WOUND CARE / SHOWERING  Keep the surgical dressing until follow up.  The dressing is water proof, so you can shower without any extra covering.  IF THE DRESSING FALLS OFF or the wound gets wet inside, change the dressing with sterile gauze.  Please use good hand washing techniques before changing the dressing.  Do not use any lotions or creams on the incision until instructed by your surgeon.    ACTIVITY  Increase activity slowly as tolerated, but follow the weight bearing instructions below.   No driving for 6 weeks or until further direction given by your physician.  You cannot drive while taking narcotics.  No lifting or carrying greater than 10 lbs. until further directed by your surgeon. Avoid periods of inactivity such as sitting longer than an hour when not asleep. This helps prevent blood clots.  You may return to work once you are authorized by your doctor.     WEIGHT BEARING   Weight bearing as tolerated with assist device (walker, cane, etc) as directed, use it as long as suggested by your surgeon or therapist, typically at least 4-6 weeks.   EXERCISES  Results after joint replacement surgery are often greatly improved when you follow the exercise, range of motion and muscle strengthening exercises prescribed by your doctor. Safety measures are also important to  protect the joint from further injury. Any time any of these exercises cause you to have increased pain or swelling, decrease what you are doing until you are comfortable again and then slowly increase them. If you have problems or questions, call your caregiver or physical therapist for advice.   Rehabilitation is important following a joint replacement. After just a few days of immobilization, the muscles of the leg can become weakened and shrink (atrophy).  These exercises are designed to build up the tone and strength of the thigh and leg muscles and to improve motion. Often times heat used for twenty to thirty minutes before working out will loosen up your tissues and help with improving the range of motion but do not use heat for the first two weeks  following surgery (sometimes heat can increase post-operative swelling).   These exercises can be done on a training (exercise) mat, on the floor, on a table or on a bed. Use whatever works the best and is most comfortable for you.    Use music or television while you are exercising so that the exercises are a pleasant break in your day. This will make your life better with the exercises acting as a break in your routine that you can look forward to.   Perform all exercises about fifteen times, three times per day or as directed.  You should exercise both the operative leg and the other leg as well.   Exercises include:   Quad Sets - Tighten up the muscle on the front of the thigh (Quad) and hold for 5-10 seconds.   Straight Leg Raises - With your knee straight (if you were given a brace, keep it on), lift the leg to 60 degrees, hold for 3 seconds, and slowly lower the leg.  Perform this exercise against resistance later as your leg gets stronger.  Leg Slides: Lying on your back, slowly slide your foot toward your buttocks, bending your knee up off the floor (only go as far as is comfortable). Then slowly slide your foot back down until your leg is flat  on the floor again.  Angel Wings: Lying on your back spread your legs to the side as far apart as you can without causing discomfort.  Hamstring Strength:  Lying on your back, push your heel against the floor with your leg straight by tightening up the muscles of your buttocks.  Repeat, but this time bend your knee to a comfortable angle, and push your heel against the floor.  You may put a pillow under the heel to make it more comfortable if necessary.   A rehabilitation program following joint replacement surgery can speed recovery and prevent re-injury in the future due to weakened muscles. Contact your doctor or a physical therapist for more information on knee rehabilitation.    CONSTIPATION  Constipation is defined medically as fewer than three stools per week and severe constipation as less than one stool per week.  Even if you have a regular bowel pattern at home, your normal regimen is likely to be disrupted due to multiple reasons following surgery.  Combination of anesthesia, postoperative narcotics, change in appetite and fluid intake all can affect your bowels.   YOU MUST use at least one of the following options; they are listed in order of increasing strength to get the job done.  They are all available over the counter, and you may need to use some, POSSIBLY even all of these options:    Drink plenty of fluids (prune juice may be helpful) and high fiber foods Colace 100 mg by mouth twice a day  Senokot for constipation as directed and as needed Dulcolax (bisacodyl), take with full glass of water  Miralax (polyethylene glycol) once or twice a day as needed.  If you have tried all these things and are unable to have a bowel movement in the first 3-4 days after surgery call either your surgeon or your primary doctor.    If you experience loose stools or diarrhea, hold the medications until you stool forms back up.  If your symptoms do not get better within 1 week or if they get  worse, check with your doctor.  If you experience "the worst abdominal pain ever" or develop nausea or vomiting, please  contact the office immediately for further recommendations for treatment.   ITCHING:  If you experience itching with your medications, try taking only a single pain pill, or even half a pain pill at a time.  You can also use Benadryl over the counter for itching or also to help with sleep.   TED HOSE STOCKINGS:  Use stockings on both legs until for at least 2 weeks or as directed by physician office. They may be removed at night for sleeping.  MEDICATIONS:  See your medication summary on the "After Visit Summary" that nursing will review with you.  You may have some home medications which will be placed on hold until you complete the course of blood thinner medication.  It is important for you to complete the blood thinner medication as prescribed.  PRECAUTIONS:  If you experience chest pain or shortness of breath - call 911 immediately for transfer to the hospital emergency department.   If you develop a fever greater that 101 F, purulent drainage from wound, increased redness or drainage from wound, foul odor from the wound/dressing, or calf pain - CONTACT YOUR SURGEON.                                                   FOLLOW-UP APPOINTMENTS:  If you do not already have a post-op appointment, please call the office for an appointment to be seen by your surgeon.  Guidelines for how soon to be seen are listed in your "After Visit Summary", but are typically between 1-4 weeks after surgery.  OTHER INSTRUCTIONS:   Knee Replacement:  Do not place pillow under knee, focus on keeping the knee straight while resting. CPM instructions: 0-90 degrees, 2 hours in the morning, 2 hours in the afternoon, and 2 hours in the evening. Place foam block, curve side up under heel at all times except when in CPM or when walking.  DO NOT modify, tear, cut, or change the foam block in any way.  MAKE  SURE YOU:  Understand these instructions.  Get help right away if you are not doing well or get worse.    Thank you for letting us be a part of your medical care team.  It is a privilege we respect greatly.  We hope these instructions will help you stay on track for a fast and full recovery!   Increase activity slowly as tolerated   Complete by:  As directed    TED hose   Complete by:  As directed    Use stockings (TED hose) for 2 weeks on both leg(s).  You may remove them at night for sleeping.     Allergies as of 03/24/2018   No Known Allergies     Medication List    STOP taking these medications   diclofenac sodium 1 % Gel Commonly known as:  VOLTAREN   meloxicam 15 MG tablet Commonly known as:  MOBIC   traMADol 50 MG tablet Commonly known as:  ULTRAM     TAKE these medications   acetaminophen 500 MG tablet Commonly known as:  TYLENOL Take 500 mg by mouth every 6 (six) hours as needed for mild pain or moderate pain.   amLODipine 5 MG tablet Commonly known as:  NORVASC Take 1 tablet (5 mg total) by mouth daily.   baclofen 10 MG tablet Commonly  known as:  LIORESAL Take 1 tablet (10 mg total) by mouth 3 (three) times daily.   docusate sodium 100 MG capsule Commonly known as:  COLACE Take 1 capsule (100 mg total) by mouth 2 (two) times daily.   ferrous sulfate 325 (65 FE) MG tablet Take 1 tablet (325 mg total) by mouth 3 (three) times daily after meals.   ketoconazole 2 % cream Commonly known as:  NIZORAL Apply 1 application topically 2 (two) times daily. What changed:    when to take this  reasons to take this   polyethylene glycol packet Commonly known as:  MIRALAX / GLYCOLAX Take 17 g by mouth daily as needed for mild constipation.   rivaroxaban 10 MG Tabs tablet Commonly known as:  XARELTO Take 1 tablet (10 mg total) by mouth daily.   sertraline 50 MG tablet Commonly known as:  ZOLOFT Take 1.5 tablets (75 mg total) by mouth daily. What changed:   how much to take            Discharge Care Instructions  (From admission, onward)        Start     Ordered   03/23/18 0000  Change dressing    Comments:  Maintain surgical dressing until follow up in the clinic. If the edges start to pull up, may reinforce with tape. If the dressing is no longer working, may remove and cover with gauze and tape, but must keep the area dry and clean.  Call with any questions or concerns.   03/23/18 0946      Contact information for follow-up providers    [facility] Follow up.   Specialty:  Home Health Services Why:  Home Health Physical Therapy- agency will call to arrange initial visit Contact information: [address] [place] [address] [phone]            Contact information for after-discharge care    Destination    [facility] .   Service:  Skilled Nursing Contact information: [address] [place] [address] [phone]                  Signed: Markham Jordan 03/24/2018, 1:54 PM

## 2018-03-24 NOTE — Progress Notes (Signed)
Subjective: 4 Days Post-Op Procedure(s) (LRB): RIGHT TOTAL KNEE ARTHROPLASTY (Right) Patient reports pain as moderate to right knee.  Tolerating Po's. Limited PT. Denies SOB,CP,or claf pain.  Objective: Vital signs in last 24 hours: Temp:  [98.2 F (36.8 C)-99.3 F (37.4 C)] 99.3 F (37.4 C) (04/08 1949) Pulse Rate:  [66-79] 79 (04/08 1949) Resp:  [18-20] 18 (04/08 1949) BP: (147)/(50) 147/50 (04/08 1949) SpO2:  [86 %-92 %] 86 % (04/08 1949)  Intake/Output from previous day: 04/08 0701 - 04/09 0700 In: 6458.8 [P.O.:480; I.V.:5978.8] Out: -  Intake/Output this shift: No intake/output data recorded.  Recent Labs    03/22/18 0440 03/23/18 0445  HGB 11.4* 11.3*   Recent Labs    03/22/18 0440 03/23/18 0445  WBC 9.6 7.1  RBC 3.83* 3.87  HCT 35.7* 36.3  PLT 139* 131*   No results for input(s): NA, K, CL, CO2, BUN, CREATININE, GLUCOSE, CALCIUM in the last 72 hours. No results for input(s): LABPT, INR in the last 72 hours.  Well nourished. Alert and oriented x3. RRR, Lungs clear, BS x4. Abdomen soft and non tender. Right Calf soft and non tender. Right knee dressing C/D/I. No DVT signs. Compartment soft. No signs of infection.  Right LE grossly neurovascular intact.  Anticipated LOS equal to or greater than 2 midnights due to - Age 75 and older with one or more of the following:  - Obesity  - Expected need for hospital services (PT, OT, Nursing) required for safe  discharge  - Anticipated need for postoperative skilled nursing care or inpatient rehab  - Active co-morbidities: None OR   - Unanticipated findings during/Post Surgery: None  - Patient is a high risk of re-admission due to: None   Assessment/Plan: 4 Days Post-Op Procedure(s) (LRB): RIGHT TOTAL KNEE ARTHROPLASTY (Right) Up with therapy  Plan to D/c to SNF possible later today Change Pain mediation to Dilaudid Xarelto at D/c     Azaria Stegman L 03/24/2018, 8:15 AM

## 2018-03-24 NOTE — Progress Notes (Signed)
Physical Therapy Treatment Patient Details Name: Autumn Price MRN: [id number] DOB: 1941/12/10 Today's Date: 03/24/2018    History of Present Illness s/p R TKA; PMHx: HTN    PT Comments    POD # 4 Pt progressing slowly due to her BMI, weak, pain and her "other" knee is "bad".  Assisted OOB to [facility] and back to bed only this session.  Pt plans to D/C to SNF later today.   Follow Up Recommendations  SNF     Equipment Recommendations       Recommendations for Other Services       Precautions / Restrictions Precautions Precautions: Knee Precaution Comments: pt unable to perform sit to stand and stand to sit with KI on, so did not use Restrictions Weight Bearing Restrictions: No Other Position/Activity Restrictions: WBAT    Mobility  Bed Mobility Overal bed mobility: Needs Assistance Bed Mobility: Supine to Sit;Sit to Supine     Supine to sit: Min assist Sit to supine: Mod assist;Max assist   General bed mobility comments: assisted OOB and back to bed  More assist needed back to bed  Transfers Overall transfer level: Needs assistance Equipment used: Rolling walker (2 wheeled) Transfers: Sit to/from Stand Sit to Stand: +2 physical assistance;+2 safety/equipment;Total assist         General transfer comment: assisted from elevated bed to [facility] then back to bed with increased time  Ambulation/Gait Ambulation/Gait assistance: Min assist Ambulation Distance (Feet): 2 Feet(side steps to and from [facility] and to [facility]) Assistive device: Rolling walker (2 wheeled) Gait Pattern/deviations: Step-to pattern;Antalgic Gait velocity: decreased   General Gait Details: limited amb dustance due to increased pain and fatigue   Stairs            Wheelchair Mobility    Modified Rankin (Stroke Patients Only)       Balance                                            Cognition Arousal/Alertness: Awake/alert Behavior During Therapy: WFL for tasks  assessed/performed Overall Cognitive Status: Within Functional Limits for tasks assessed                                        Exercises      General Comments        Pertinent Vitals/Pain Pain Assessment: 0-10 Pain Score: 8  Pain Location: both knees "are bad" Pain Descriptors / Indicators: Discomfort;Grimacing;Operative site guarding Pain Intervention(s): Monitored during session;Premedicated before session;Repositioned;Ice applied    Home Living                      Prior Function            PT Goals (current goals can now be found in the care plan section) Progress towards PT goals: Progressing toward goals    Frequency    7X/week      PT Plan Current plan remains appropriate    Co-evaluation              AM-PAC PT "6 Clicks" Daily Activity  Outcome Measure  Difficulty turning over in bed (including adjusting bedclothes, sheets and blankets)?: Unable Difficulty moving from lying on back to sitting on the side of the bed? : Unable Difficulty sitting  down on and standing up from a chair with arms (e.g., wheelchair, bedside commode, etc,.)?: Unable Help needed moving to and from a bed to chair (including a wheelchair)?: A Lot Help needed walking in hospital room?: A Lot Help needed climbing 3-5 steps with a railing? : Total 6 Click Score: 8    End of Session Equipment Utilized During Treatment: Gait belt Activity Tolerance: Patient limited by fatigue;Patient limited by pain Patient left: in bed Nurse Communication: Mobility status PT Visit Diagnosis: Difficulty in walking, not elsewhere classified (R26.2)     Time: 2440-10271410-1425 PT Time Calculation (min) (ACUTE ONLY): 15 min  Charges:  $Therapeutic Activity: 8-22 mins                    G Codes:       Felecia ShellingLori Caedence Snowden  PTA [facility]  Acute  Rehab Pager      [phone]

## 2018-03-24 NOTE — Progress Notes (Signed)
2nd shift [facility] ED CSW checked pt's PASSR on [place] and currently it states "PASARR Only Review".  2nd shift ED CSW will leave handoff for 1st shift CSW.  Please reconsult if future social work needs arise.  CSW signing off, as social work intervention is no longer needed.  Dorothe PeaJonathan F. Lasean Gorniak, LCSW, LCAS, CSI Clinical Social Worker Ph: [phone]

## 2018-03-25 DIAGNOSIS — G8911 Acute pain due to trauma: Secondary | ICD-10-CM | POA: Diagnosis not present

## 2018-03-25 DIAGNOSIS — Z4889 Encounter for other specified surgical aftercare: Secondary | ICD-10-CM | POA: Diagnosis not present

## 2018-03-25 DIAGNOSIS — M1990 Unspecified osteoarthritis, unspecified site: Secondary | ICD-10-CM | POA: Diagnosis not present

## 2018-03-25 DIAGNOSIS — S8990XA Unspecified injury of unspecified lower leg, initial encounter: Secondary | ICD-10-CM | POA: Diagnosis not present

## 2018-03-25 DIAGNOSIS — R262 Difficulty in walking, not elsewhere classified: Secondary | ICD-10-CM | POA: Diagnosis not present

## 2018-03-25 DIAGNOSIS — I10 Essential (primary) hypertension: Secondary | ICD-10-CM | POA: Diagnosis not present

## 2018-03-25 DIAGNOSIS — Z96651 Presence of right artificial knee joint: Secondary | ICD-10-CM | POA: Diagnosis not present

## 2018-03-25 NOTE — Progress Notes (Signed)
Pt discharged to facility, [facility] at [phone] Autumn Price. EMS picking her up at 1045.

## 2018-03-25 NOTE — Progress Notes (Signed)
PASRR received.  Nurse given the number to call report.  [facility] arranged to transport at 10:45am/SNF Nurse liaison aware.   Vivi BarrackNicole Greyson Riccardi, Theresia MajorsLCSWA, MSW Clinical Social Worker  [phone] 03/25/2018  10:28 AM

## 2018-03-27 ENCOUNTER — Telehealth: Payer: Self-pay

## 2018-03-27 DIAGNOSIS — Z9181 History of falling: Secondary | ICD-10-CM | POA: Diagnosis not present

## 2018-03-27 DIAGNOSIS — Z96651 Presence of right artificial knee joint: Secondary | ICD-10-CM | POA: Diagnosis not present

## 2018-03-27 DIAGNOSIS — K5900 Constipation, unspecified: Secondary | ICD-10-CM | POA: Diagnosis not present

## 2018-03-27 DIAGNOSIS — I10 Essential (primary) hypertension: Secondary | ICD-10-CM | POA: Diagnosis not present

## 2018-03-27 DIAGNOSIS — M150 Primary generalized (osteo)arthritis: Secondary | ICD-10-CM | POA: Diagnosis not present

## 2018-03-27 DIAGNOSIS — I872 Venous insufficiency (chronic) (peripheral): Secondary | ICD-10-CM | POA: Diagnosis not present

## 2018-03-27 DIAGNOSIS — Z7901 Long term (current) use of anticoagulants: Secondary | ICD-10-CM | POA: Diagnosis not present

## 2018-03-27 DIAGNOSIS — Z79891 Long term (current) use of opiate analgesic: Secondary | ICD-10-CM | POA: Diagnosis not present

## 2018-03-27 DIAGNOSIS — Z471 Aftercare following joint replacement surgery: Secondary | ICD-10-CM | POA: Diagnosis not present

## 2018-03-27 NOTE — Telephone Encounter (Signed)
Patient left facility due to care issue concerns.  She is at home with support.  Her niece is staying around the clock to provide ADL assistance.  In addition, she has home health PT coming in working with her. States she is doing well and doesn't need anything at this point. She sees ortho on Monday and does not feel like she needs appt with PCP at this time.  Patient wants PCP to know how much it meant to her that he called and checked on her in the hospital.  She wants to thank him for the excellent care that he gives.

## 2018-03-28 NOTE — Telephone Encounter (Signed)
She has had a lot of pain after TKR ---but no true complications that I am aware of.

## 2018-03-30 DIAGNOSIS — Z471 Aftercare following joint replacement surgery: Secondary | ICD-10-CM | POA: Diagnosis not present

## 2018-03-30 DIAGNOSIS — Z9181 History of falling: Secondary | ICD-10-CM | POA: Diagnosis not present

## 2018-03-30 DIAGNOSIS — I10 Essential (primary) hypertension: Secondary | ICD-10-CM | POA: Diagnosis not present

## 2018-03-30 DIAGNOSIS — M150 Primary generalized (osteo)arthritis: Secondary | ICD-10-CM | POA: Diagnosis not present

## 2018-03-30 DIAGNOSIS — K5900 Constipation, unspecified: Secondary | ICD-10-CM | POA: Diagnosis not present

## 2018-03-30 DIAGNOSIS — I872 Venous insufficiency (chronic) (peripheral): Secondary | ICD-10-CM | POA: Diagnosis not present

## 2018-03-30 DIAGNOSIS — Z96651 Presence of right artificial knee joint: Secondary | ICD-10-CM | POA: Diagnosis not present

## 2018-03-30 DIAGNOSIS — Z79891 Long term (current) use of opiate analgesic: Secondary | ICD-10-CM | POA: Diagnosis not present

## 2018-03-30 DIAGNOSIS — Z7901 Long term (current) use of anticoagulants: Secondary | ICD-10-CM | POA: Diagnosis not present

## 2018-04-01 DIAGNOSIS — M150 Primary generalized (osteo)arthritis: Secondary | ICD-10-CM | POA: Diagnosis not present

## 2018-04-01 DIAGNOSIS — Z79891 Long term (current) use of opiate analgesic: Secondary | ICD-10-CM | POA: Diagnosis not present

## 2018-04-01 DIAGNOSIS — I872 Venous insufficiency (chronic) (peripheral): Secondary | ICD-10-CM | POA: Diagnosis not present

## 2018-04-01 DIAGNOSIS — K5900 Constipation, unspecified: Secondary | ICD-10-CM | POA: Diagnosis not present

## 2018-04-01 DIAGNOSIS — Z96651 Presence of right artificial knee joint: Secondary | ICD-10-CM | POA: Diagnosis not present

## 2018-04-01 DIAGNOSIS — Z471 Aftercare following joint replacement surgery: Secondary | ICD-10-CM | POA: Diagnosis not present

## 2018-04-01 DIAGNOSIS — Z7901 Long term (current) use of anticoagulants: Secondary | ICD-10-CM | POA: Diagnosis not present

## 2018-04-01 DIAGNOSIS — Z9181 History of falling: Secondary | ICD-10-CM | POA: Diagnosis not present

## 2018-04-01 DIAGNOSIS — I10 Essential (primary) hypertension: Secondary | ICD-10-CM | POA: Diagnosis not present

## 2018-04-03 DIAGNOSIS — I10 Essential (primary) hypertension: Secondary | ICD-10-CM | POA: Diagnosis not present

## 2018-04-03 DIAGNOSIS — I872 Venous insufficiency (chronic) (peripheral): Secondary | ICD-10-CM | POA: Diagnosis not present

## 2018-04-03 DIAGNOSIS — M150 Primary generalized (osteo)arthritis: Secondary | ICD-10-CM | POA: Diagnosis not present

## 2018-04-03 DIAGNOSIS — Z471 Aftercare following joint replacement surgery: Secondary | ICD-10-CM | POA: Diagnosis not present

## 2018-04-03 DIAGNOSIS — Z9181 History of falling: Secondary | ICD-10-CM | POA: Diagnosis not present

## 2018-04-03 DIAGNOSIS — Z7901 Long term (current) use of anticoagulants: Secondary | ICD-10-CM | POA: Diagnosis not present

## 2018-04-03 DIAGNOSIS — K5900 Constipation, unspecified: Secondary | ICD-10-CM | POA: Diagnosis not present

## 2018-04-03 DIAGNOSIS — Z96651 Presence of right artificial knee joint: Secondary | ICD-10-CM | POA: Diagnosis not present

## 2018-04-03 DIAGNOSIS — Z79891 Long term (current) use of opiate analgesic: Secondary | ICD-10-CM | POA: Diagnosis not present

## 2018-04-06 DIAGNOSIS — M25562 Pain in left knee: Secondary | ICD-10-CM | POA: Diagnosis not present

## 2018-04-06 DIAGNOSIS — Z96651 Presence of right artificial knee joint: Secondary | ICD-10-CM | POA: Diagnosis not present

## 2018-04-06 DIAGNOSIS — Z471 Aftercare following joint replacement surgery: Secondary | ICD-10-CM | POA: Diagnosis not present

## 2018-04-07 DIAGNOSIS — Z471 Aftercare following joint replacement surgery: Secondary | ICD-10-CM | POA: Diagnosis not present

## 2018-04-07 DIAGNOSIS — K5900 Constipation, unspecified: Secondary | ICD-10-CM | POA: Diagnosis not present

## 2018-04-07 DIAGNOSIS — Z9181 History of falling: Secondary | ICD-10-CM | POA: Diagnosis not present

## 2018-04-07 DIAGNOSIS — Z7901 Long term (current) use of anticoagulants: Secondary | ICD-10-CM | POA: Diagnosis not present

## 2018-04-07 DIAGNOSIS — I872 Venous insufficiency (chronic) (peripheral): Secondary | ICD-10-CM | POA: Diagnosis not present

## 2018-04-07 DIAGNOSIS — Z79891 Long term (current) use of opiate analgesic: Secondary | ICD-10-CM | POA: Diagnosis not present

## 2018-04-07 DIAGNOSIS — M150 Primary generalized (osteo)arthritis: Secondary | ICD-10-CM | POA: Diagnosis not present

## 2018-04-07 DIAGNOSIS — I10 Essential (primary) hypertension: Secondary | ICD-10-CM | POA: Diagnosis not present

## 2018-04-07 DIAGNOSIS — Z96651 Presence of right artificial knee joint: Secondary | ICD-10-CM | POA: Diagnosis not present

## 2018-04-08 DIAGNOSIS — Z7901 Long term (current) use of anticoagulants: Secondary | ICD-10-CM | POA: Diagnosis not present

## 2018-04-08 DIAGNOSIS — K5900 Constipation, unspecified: Secondary | ICD-10-CM | POA: Diagnosis not present

## 2018-04-08 DIAGNOSIS — Z96651 Presence of right artificial knee joint: Secondary | ICD-10-CM | POA: Diagnosis not present

## 2018-04-08 DIAGNOSIS — I872 Venous insufficiency (chronic) (peripheral): Secondary | ICD-10-CM | POA: Diagnosis not present

## 2018-04-08 DIAGNOSIS — I10 Essential (primary) hypertension: Secondary | ICD-10-CM | POA: Diagnosis not present

## 2018-04-08 DIAGNOSIS — Z9181 History of falling: Secondary | ICD-10-CM | POA: Diagnosis not present

## 2018-04-08 DIAGNOSIS — Z471 Aftercare following joint replacement surgery: Secondary | ICD-10-CM | POA: Diagnosis not present

## 2018-04-08 DIAGNOSIS — Z79891 Long term (current) use of opiate analgesic: Secondary | ICD-10-CM | POA: Diagnosis not present

## 2018-04-08 DIAGNOSIS — M150 Primary generalized (osteo)arthritis: Secondary | ICD-10-CM | POA: Diagnosis not present

## 2018-04-10 DIAGNOSIS — Z7901 Long term (current) use of anticoagulants: Secondary | ICD-10-CM | POA: Diagnosis not present

## 2018-04-10 DIAGNOSIS — Z471 Aftercare following joint replacement surgery: Secondary | ICD-10-CM | POA: Diagnosis not present

## 2018-04-10 DIAGNOSIS — K5900 Constipation, unspecified: Secondary | ICD-10-CM | POA: Diagnosis not present

## 2018-04-10 DIAGNOSIS — M150 Primary generalized (osteo)arthritis: Secondary | ICD-10-CM | POA: Diagnosis not present

## 2018-04-10 DIAGNOSIS — I10 Essential (primary) hypertension: Secondary | ICD-10-CM | POA: Diagnosis not present

## 2018-04-10 DIAGNOSIS — Z9181 History of falling: Secondary | ICD-10-CM | POA: Diagnosis not present

## 2018-04-10 DIAGNOSIS — I872 Venous insufficiency (chronic) (peripheral): Secondary | ICD-10-CM | POA: Diagnosis not present

## 2018-04-10 DIAGNOSIS — Z96651 Presence of right artificial knee joint: Secondary | ICD-10-CM | POA: Diagnosis not present

## 2018-04-10 DIAGNOSIS — Z79891 Long term (current) use of opiate analgesic: Secondary | ICD-10-CM | POA: Diagnosis not present

## 2018-04-14 DIAGNOSIS — R262 Difficulty in walking, not elsewhere classified: Secondary | ICD-10-CM | POA: Diagnosis not present

## 2018-04-14 DIAGNOSIS — M25561 Pain in right knee: Secondary | ICD-10-CM | POA: Diagnosis not present

## 2018-04-14 DIAGNOSIS — Z96651 Presence of right artificial knee joint: Secondary | ICD-10-CM | POA: Diagnosis not present

## 2018-04-17 DIAGNOSIS — R262 Difficulty in walking, not elsewhere classified: Secondary | ICD-10-CM | POA: Diagnosis not present

## 2018-04-17 DIAGNOSIS — M25561 Pain in right knee: Secondary | ICD-10-CM | POA: Diagnosis not present

## 2018-04-17 DIAGNOSIS — Z96651 Presence of right artificial knee joint: Secondary | ICD-10-CM | POA: Diagnosis not present

## 2018-04-20 ENCOUNTER — Other Ambulatory Visit: Payer: Self-pay

## 2018-04-20 ENCOUNTER — Emergency Department (HOSPITAL_BASED_OUTPATIENT_CLINIC_OR_DEPARTMENT_OTHER): Payer: Medicare Other

## 2018-04-20 ENCOUNTER — Ambulatory Visit: Payer: Self-pay | Admitting: *Deleted

## 2018-04-20 ENCOUNTER — Emergency Department (HOSPITAL_COMMUNITY): Payer: Medicare Other

## 2018-04-20 ENCOUNTER — Emergency Department (HOSPITAL_COMMUNITY)
Admission: EM | Admit: 2018-04-20 | Discharge: 2018-04-20 | Disposition: A | Discharge status: Home / Self Care | Payer: Medicare Other | Attending: Emergency Medicine | Admitting: Emergency Medicine

## 2018-04-20 ENCOUNTER — Encounter (HOSPITAL_COMMUNITY): Payer: Self-pay | Admitting: Obstetrics and Gynecology

## 2018-04-20 DIAGNOSIS — Z96651 Presence of right artificial knee joint: Secondary | ICD-10-CM | POA: Diagnosis not present

## 2018-04-20 DIAGNOSIS — R609 Edema, unspecified: Secondary | ICD-10-CM | POA: Diagnosis not present

## 2018-04-20 DIAGNOSIS — I872 Venous insufficiency (chronic) (peripheral): Secondary | ICD-10-CM | POA: Diagnosis not present

## 2018-04-20 DIAGNOSIS — R188 Other ascites: Secondary | ICD-10-CM | POA: Diagnosis not present

## 2018-04-20 DIAGNOSIS — R531 Weakness: Secondary | ICD-10-CM | POA: Diagnosis not present

## 2018-04-20 DIAGNOSIS — M6281 Muscle weakness (generalized): Secondary | ICD-10-CM | POA: Diagnosis not present

## 2018-04-20 DIAGNOSIS — Z7901 Long term (current) use of anticoagulants: Secondary | ICD-10-CM | POA: Insufficient documentation

## 2018-04-20 DIAGNOSIS — Z471 Aftercare following joint replacement surgery: Secondary | ICD-10-CM | POA: Diagnosis not present

## 2018-04-20 DIAGNOSIS — I10 Essential (primary) hypertension: Secondary | ICD-10-CM | POA: Diagnosis not present

## 2018-04-20 DIAGNOSIS — Z79899 Other long term (current) drug therapy: Secondary | ICD-10-CM | POA: Insufficient documentation

## 2018-04-20 DIAGNOSIS — F32A Depression, unspecified: Secondary | ICD-10-CM

## 2018-04-20 DIAGNOSIS — F329 Major depressive disorder, single episode, unspecified: Secondary | ICD-10-CM | POA: Diagnosis not present

## 2018-04-20 HISTORY — DX: Depression, unspecified: F32.A

## 2018-04-20 HISTORY — DX: Major depressive disorder, single episode, unspecified: F32.9

## 2018-04-20 LAB — URINALYSIS, ROUTINE W REFLEX MICROSCOPIC
Bilirubin Urine: NEGATIVE
Glucose, UA: NEGATIVE mg/dL
Hgb urine dipstick: NEGATIVE
Ketones, ur: 5 mg/dL — AB
Leukocytes, UA: NEGATIVE
Nitrite: NEGATIVE
Protein, ur: NEGATIVE mg/dL
Specific Gravity, Urine: 1.013 (ref 1.005–1.030)
pH: 5 (ref 5.0–8.0)

## 2018-04-20 LAB — COMPREHENSIVE METABOLIC PANEL
ALT: 10 U/L — ABNORMAL LOW (ref 14–54)
AST: 18 U/L (ref 15–41)
Albumin: 3.9 g/dL (ref 3.5–5.0)
Alkaline Phosphatase: 99 U/L (ref 38–126)
Anion gap: 12 (ref 5–15)
BUN: 14 mg/dL (ref 6–20)
CO2: 25 mmol/L (ref 22–32)
Calcium: 9.6 mg/dL (ref 8.9–10.3)
Chloride: 107 mmol/L (ref 101–111)
Creatinine, Ser: 0.61 mg/dL (ref 0.44–1.00)
GFR calc Af Amer: >60 mL/min (ref >60)
GFR calc non Af Amer: >60 mL/min (ref >60)
Glucose, Bld: 109 mg/dL — ABNORMAL HIGH (ref 65–99)
Potassium: 3.8 mmol/L (ref 3.5–5.1)
Sodium: 144 mmol/L (ref 135–145)
Total Bilirubin: 1 mg/dL (ref 0.3–1.2)
Total Protein: 7.7 g/dL (ref 6.5–8.1)

## 2018-04-20 LAB — CBC WITH DIFFERENTIAL/PLATELET
Basophils Absolute: 0 10*3/uL (ref 0.0–0.1)
Basophils Relative: 0 %
Eosinophils Absolute: 0.1 10*3/uL (ref 0.0–0.7)
Eosinophils Relative: 2 %
HCT: 40 % (ref 36.0–46.0)
Hemoglobin: 12.8 g/dL (ref 12.0–15.0)
Lymphocytes Relative: 25 %
Lymphs Abs: 1.2 10*3/uL (ref 0.7–4.0)
MCH: 29.4 pg (ref 26.0–34.0)
MCHC: 32 g/dL (ref 30.0–36.0)
MCV: 91.7 fL (ref 78.0–100.0)
Monocytes Absolute: 0.6 10*3/uL (ref 0.1–1.0)
Monocytes Relative: 11 %
Neutro Abs: 3.1 10*3/uL (ref 1.7–7.7)
Neutrophils Relative %: 62 %
Platelets: 157 10*3/uL (ref 150–400)
RBC: 4.36 MIL/uL (ref 3.87–5.11)
RDW: 13.6 % (ref 11.5–15.5)
WBC: 5 10*3/uL (ref 4.0–10.5)

## 2018-04-20 LAB — CK: Total CK: 31 U/L — ABNORMAL LOW (ref 38–234)

## 2018-04-20 LAB — TSH: TSH: 0.659 u[IU]/mL (ref 0.350–4.500)

## 2018-04-20 LAB — I-STAT CG4 LACTIC ACID, ED
Lactic Acid, Venous: 1.18 mmol/L (ref 0.5–1.9)
Lactic Acid, Venous: 1.82 mmol/L (ref 0.5–1.9)

## 2018-04-20 LAB — I-STAT TROPONIN, ED: Troponin i, poc: 0.01 ng/mL (ref 0.00–0.08)

## 2018-04-20 MED ORDER — ALPRAZOLAM 0.25 MG PO TABS: 0.2500 mg | ORAL_TABLET | Freq: Every evening | ORAL | 0 refills | Status: DC | PRN

## 2018-04-20 NOTE — ED Provider Notes (Signed)
[facility]-EMERGENCY DEPT Provider Note   CSN: [id number] Arrival date & time: 04/20/18  1034     History   Chief Complaint Chief Complaint  Patient presents with  . Weakness  . Depression    HPI Autumn Price is a 77 y.o. female.  HPI Patient had a knee replacement on the right in the beginning of April.  She reports that she has been having difficulty since that time with extreme fatigue and also developing depression.  She denies she is specifically having a lot of pain in the knee.  She reports sometimes during physical therapy it is painful.  He took her last oxycodone dose 2 days ago.  She reports she typically tries to take Tylenol for pain control.  She reports that she has become so weak and fatigued that just walking a short distance across her house makes her completely worn out.  She reports that she does feel somewhat short of breath with exertion.  No specific chest pain.  No headache no syncope.  No focal weakness numbness or tingling.  No fevers that she is aware of.  Patient does live alone.  She reports that she is used to being very independent and taking care of herself, now having to have other people come in and help her is very upsetting to her.  She reports that she can cry very easily and can only sleep about 2 hours at a time at night.  He has been taking Zoloft for about 8 months.  She reports before the procedure she seemed to be doing well. Past Medical History:  Diagnosis Date  . Depression   . Hypertension   . Osteoarthritis   . Urge incontinence   . Venous insufficiency     Patient Active Problem List   Diagnosis Date Noted  . Primary osteoarthritis of right knee 03/20/2018  . S/P knee replacement 03/20/2018  . Pre-op evaluation 02/11/2018  . Osteoarthritis of both knees 08/30/2016  . Advance directive discussed with patient 08/04/2015  . Mood disorder (HCC) 06/10/2014  . BMI 40.0-44.9, adult (HCC) 05/22/2012  . Routine  general medical examination at a health care facility 03/19/2011  . Urge incontinence 03/19/2011  . Essential hypertension, benign 08/21/2010  . Chronic venous insufficiency 08/21/2010  . Osteoarthritis, multiple sites 08/21/2010    Past Surgical History:  Procedure Laterality Date  . APPENDECTOMY  1966  . DILATION AND CURETTAGE OF UTERUS  01/27/14   benign  . TOTAL KNEE ARTHROPLASTY Right 03/20/2018   Procedure: RIGHT TOTAL KNEE ARTHROPLASTY;  Surgeon: Eugenia Mcalpine, MD;  Location: [facility] ORS;  Service: Orthopedics;  Laterality: Right;  . TUBAL LIGATION  1966     OB History   None      Home Medications    Prior to Admission medications   Medication Sig Start Date End Date Taking? Authorizing Provider  acetaminophen (TYLENOL) 500 MG tablet Take 500 mg by mouth every 6 (six) hours as needed for mild pain or moderate pain.    Yes [provider]  amLODipine (NORVASC) 5 MG tablet Take 1 tablet (5 mg total) by mouth daily. 08/12/17  Yes Karie Schwalbe, MD  baclofen (LIORESAL) 10 MG tablet Take 1 tablet (10 mg total) by mouth 3 (three) times daily as needed for muscle spasms. 03/24/18 03/24/19 Yes Stilwell, Bryson L, PA-C  diclofenac sodium (VOLTAREN) 1 % GEL Apply 1 application topically daily as needed for pain. knees 04/17/18  Yes [provider]  ketoconazole (  NIZORAL) 2 % cream Apply 1 application topically 2 (two) times daily. Patient taking differently: Apply 1 application topically 2 (two) times daily as needed for irritation.  01/10/17  Yes Tillman Abide I, MD  oxyCODONE (OXY IR/ROXICODONE) 5 MG immediate release tablet Take 5 mg by mouth 4 (four) times daily as needed for pain. 04/06/18  Yes [provider]  rivaroxaban (XARELTO) 10 MG TABS tablet Take 1 tablet (10 mg total) by mouth daily. 03/24/18  Yes Stilwell, Bryson L, PA-C  sertraline (ZOLOFT) 50 MG tablet Take 1.5 tablets (75 mg total) by mouth daily. Patient taking differently: Take 50 mg by mouth  daily.  01/23/18  Yes Emi Belfast, FNP  traMADol (ULTRAM) 50 MG tablet Take 50-100 mg by mouth 2 (two) times daily as needed for pain.   Yes [provider]  ALPRAZolam (XANAX) 0.25 MG tablet Take 1 tablet (0.25 mg total) by mouth at bedtime as needed for anxiety. 04/20/18   Arby Barrette, MD  baclofen (LIORESAL) 10 MG tablet Take 1 tablet (10 mg total) by mouth 3 (three) times daily. Patient not taking: Reported on 04/20/2018 03/23/18 03/23/19  Lanney Gins, PA-C  docusate sodium (COLACE) 100 MG capsule Take 1 capsule (100 mg total) by mouth 2 (two) times daily. Patient not taking: Reported on 04/20/2018 03/23/18   Lanney Gins, PA-C  ferrous sulfate 325 (65 FE) MG tablet Take 1 tablet (325 mg total) by mouth 3 (three) times daily after meals. Patient not taking: Reported on 04/20/2018 03/23/18   Lanney Gins, PA-C  HYDROmorphone (DILAUDID) 2 MG tablet Take 1 tablet (2 mg total) by mouth every 4 (four) hours as needed for severe pain. Patient not taking: Reported on 04/20/2018 03/24/18 03/24/19  Stilwell, Aggie Hacker L, PA-C  polyethylene glycol (MIRALAX / GLYCOLAX) packet Take 17 g by mouth daily as needed for mild constipation. Patient not taking: Reported on 04/20/2018 03/23/18   Lanney Gins, PA-C  rivaroxaban (XARELTO) 10 MG TABS tablet Take 1 tablet (10 mg total) by mouth daily. Patient not taking: Reported on 04/20/2018 03/23/18   Lanney Gins, PA-C    Family History Family History  Problem Relation Age of Onset  . Heart disease Father        died age 54- MI  . Alcohol abuse Brother   . Heart disease Brother   . Heart disease Sister   . Breast cancer Neg Hx     Social History Social History   Tobacco Use  . Smoking status: Never Smoker  . Smokeless tobacco: Never Used  Substance Use Topics  . Alcohol use: No  . Drug use: No     Allergies   Patient has no known allergies.   Review of Systems Review of Systems 10 Systems reviewed and are negative for acute change  except as noted in the HPI.   Physical Exam Updated Vital Signs BP (!) 152/86   Pulse 85   Temp 98.9 F (37.2 C)   Resp 18   SpO2 96%   Physical Exam  Constitutional: She is oriented to person, place, and time. She appears well-developed and well-nourished. No distress.  HENT:  Head: Normocephalic and atraumatic.  Mouth/Throat: Oropharynx is clear and moist.  Eyes: Pupils are equal, round, and reactive to light. EOM are normal.  Neck: Neck supple.  Cardiovascular: Regular rhythm, normal heart sounds and intact distal pulses.  Borderline tachycardia.  No gross rub murmur gallop.  Pulmonary/Chest: Effort normal and breath sounds normal.  Abdominal: Soft. Bowel sounds  are normal. She exhibits no distension. There is no tenderness. There is no guarding.  Musculoskeletal: Normal range of motion. She exhibits no edema or tenderness.  Patient does have diffuse erythema over the anterior right knee.  The surgical incision is well-healed.  There is no drainage from it.  Erythema is blanching.  No evident effusion.  The right lower leg seems to have slightly more changes of chronic venous stasis as opposed to the left.  Dorsalis pedis pulses are 2+ and symmetric.  Neurological: She is alert and oriented to person, place, and time. She has normal strength. She exhibits normal muscle tone. Coordination normal. GCS eye subscore is 4. GCS verbal subscore is 5. GCS motor subscore is 6.  Skin: Skin is warm, dry and intact.  Psychiatric: She has a normal mood and affect.         ED Treatments / Results  Labs (all labs ordered are listed, but only abnormal results are displayed) Labs Reviewed  COMPREHENSIVE METABOLIC PANEL - Abnormal; Notable for the following components:      Result Value   Glucose, Bld 109 (*)    ALT 10 (*)    All other components within normal limits  CK - Abnormal; Notable for the following components:   Total CK 31 (*)    All other components within normal limits    CULTURE, BLOOD (ROUTINE X 2)  CULTURE, BLOOD (ROUTINE X 2)  CBC WITH DIFFERENTIAL/PLATELET  TSH  URINALYSIS, ROUTINE W REFLEX MICROSCOPIC  I-STAT CG4 LACTIC ACID, ED  I-STAT TROPONIN, ED  I-STAT CG4 LACTIC ACID, ED    EKG EKG Interpretation  Date/Time:  Monday Apr 20 2018 16:24:54 EDT Ventricular Rate:  82 PR Interval:    QRS Duration: 105 QT Interval:  407 QTC Calculation: 476 R Axis:   82 Text Interpretation:  Sinus rhythm Borderline right axis deviation no change from previous Confirmed by Arby Barrette [phone]) on 04/20/2018 5:22:47 PM   Radiology Dg Chest 2 View  Result Date: 04/20/2018 CLINICAL DATA:  77 year old who underwent RIGHT knee arthroplasty on 03/20/2018 with home physical therapy. One at recent episode of nausea. Generalized fatigue. Current history of hypertension. EXAM: CHEST - 2 VIEW COMPARISON:  None. FINDINGS: AP semi-erect and lateral images were obtained. Cardiac silhouette normal in size. Thoracic aorta mildly atherosclerotic. Hilar and mediastinal contours otherwise unremarkable. Marked elevation of the RIGHT hemidiaphragm with scar and/or atelectasis involving the RIGHT middle lobe and RIGHT lower lobe. Lungs otherwise clear. Normal pulmonary vascularity. No pleural effusions. Visualized bony thorax intact. IMPRESSION: 1.  No acute cardiopulmonary disease. 2. Elevation of the RIGHT hemidiaphragm, likely chronic, with associated scar and/or atelectasis involving the RIGHT middle lobe and RIGHT lower lobe. 3.  Aortic Atherosclerosis (ICD10-170.0) Electronically Signed   By: Hulan Saas M.D.   On: 04/20/2018 17:00   Dg Knee Complete 4 Views Right  Result Date: 04/20/2018 CLINICAL DATA:  77 year old who underwent RIGHT total knee arthroplasty on 03/20/2018, currently doing physical therapy at home. EXAM: RIGHT KNEE - COMPLETE 4+ VIEW COMPARISON:  No prior RIGHT knee x-rays. RIGHT tibia fibula x-rays 02/09/2006 are correlated. FINDINGS: Anatomic alignment of  the cemented RIGHT knee arthroplasty. No complicating features. No evidence of acute, subacute or healed fractures. Osseous demineralization. Phleboliths in the subcutaneous tissues of the ANTERIOR LOWER leg. IMPRESSION: 1. No acute osseous abnormality. 2. Anatomic alignment of the RIGHT knee arthroplasty without complicating features. Electronically Signed   By: Hulan Saas M.D.   On: 04/20/2018 17:03  Procedures Procedures (including critical care time)  Medications Ordered in ED Medications - No data to display   Initial Impression / Assessment and Plan / ED Course  I have reviewed the triage vital signs and the nursing notes.  Pertinent labs & imaging results that were available during my care of the patient were reviewed by me and considered in my medical decision making (see chart for details).      Final Clinical Impressions(s) / ED Diagnoses   Final diagnoses:  Generalized weakness  Depression, unspecified depression type   Patient is alert and appropriate.  Diagnostic evaluation is within normal limits.  No signs of DVT.  No leukocytosis.  Patient has not been febrile.  Labs do not suggest infectious etiology.  Patient has some diffuse erythema over her knee but the wound is well-healed without effusion or significant pain.  She is a counseled to follow-up closely with orthopedics for recheck.  She does not express any suicidality.  She has supportive family present.  It does appear depression and anxiety may be significant factor in the patient's symptoms.  She expresses being tearful without provocation and difficulty sleeping.  Patient will be given very short course of Xanax for nighttime anxiety and sleep.  She will be following up with her PCP this week to discuss her symptoms.  Return precautions reviewed. ED Discharge Orders        Ordered    ALPRAZolam (XANAX) 0.25 MG tablet  At bedtime PRN     04/20/18 1941       Arby Barrette, MD 04/20/18 1945

## 2018-04-20 NOTE — ED Notes (Signed)
Patient transported to X-ray 

## 2018-04-20 NOTE — Telephone Encounter (Signed)
Last filled 03-17-18 #120 Last OV 02-11-18 Next OV 08-14-18  It is currently off of her medication list most likely due to her recent surgery. I will follow-up with the pt.

## 2018-04-20 NOTE — Progress Notes (Signed)
Bilateral lower extremity venous duplex completed. Preliminary results - Technically difficult due to body habitus. No obvious evidence of DVT,suprficial thrombosis, or Baker's cyst. Toma Deiters, RVS 04/20/2018 7:17 PM

## 2018-04-20 NOTE — Telephone Encounter (Signed)
Daughter- in- law, Lawson Fiscal called - they are still waiting. I encouraged them to stay. They can ask staff what to do about feeding patient. Apologized for their wait.

## 2018-04-20 NOTE — ED Notes (Signed)
This Clinical research associate stuck pt x2 for lab draw without success.

## 2018-04-20 NOTE — Telephone Encounter (Signed)
Patient phoned in with extreme weakness that started over the last week. She recently had knee replacement and has started PT. She complains of breaking into a sweat with walking from one room to another as well as SOB with walking. Feels jittery and hands shaking a lot.  I did note SOB with our conversation. She denies any fever, chest pain and dizziness. She also stated she is crying and feeling depressed along with the weakness. She was given baclofen and oxycodone HCL 5 MG and xaralto post knee replacement which she stated she has not taken the pain med or baclofen since early Friday morning prior to PT. Advised emergency care per protocol.  Reason for Disposition . [1] SEVERE weakness (i.e., unable to walk or barely able to walk, requires support) AND     [2] new onset or worsening  Answer Assessment - Initial Assessment Questions 1. DESCRIPTION: "Describe how you are feeling."     Jittery, hands shake, walking across a room makes her start sweating.  2. SEVERITY: "How bad is it?"  "Can you stand and walk?"   - MILD - Feels weak or tired, but does not interfere with work, school or normal activities   - MODERATE - Able to stand and walk; weakness interferes with work, school, or normal activities   - SEVERE - Unable to stand or walk    Moderate. 3. ONSET:  "When did the weakness begin?"    About one week ago 4. CAUSE: "What do you think is causing the weakness?"     Uncertain. 5. MEDICINES: "Have you recently started a new medicine or had a change in the amount of a medicine?"    Not since April 11th when discharged from the hospital. Started baclofen and Oxycdone HCL 5 MG. She has not taken either of these for 3 days.  6. OTHER SYMPTOMS: "Do you have any other symptoms?" (e.g., chest pain, fever, cough, SOB, vomiting, diarrhea, bleeding)    Shortness of breath with walking. LBM today 7. PREGNANCY: "Is there any chance you are pregnant?" "When was your last menstrual period?"     no  Protocols used: WEAKNESS (GENERALIZED) AND FATIGUE-A-AH

## 2018-04-20 NOTE — Telephone Encounter (Signed)
Pt is in the ER right now.

## 2018-04-20 NOTE — ED Triage Notes (Signed)
Per Pt: Pt reports she had a knee replacement on the 5th of April and has been doing her physical therapy at home. Pt reports for the past week she has been feeling very depressed, reports one bout of nausea, and has been having low energy levels. Pt is alert and oriented x4 and denies any vomiting or diarrhea.  Pt is in a wheelchair in triage

## 2018-04-20 NOTE — Telephone Encounter (Signed)
It looks like she is in the ED now  Thanks

## 2018-04-20 NOTE — Telephone Encounter (Signed)
Malinda RN with [facility] also noted;  FYI-Advised emergency care at [facility] ER at this time. Patient en route with her son.   Routing comment

## 2018-04-21 MED ORDER — TRAMADOL HCL 50 MG PO TABS: 50.0000 mg | ORAL_TABLET | Freq: Two times a day (BID) | ORAL | 0 refills | Status: DC | PRN

## 2018-04-21 NOTE — Telephone Encounter (Signed)
Spoke to pt. She has stopped taking the oxycodone that was prescribed after her recent knee surgery and went back on her tramadol. The ER had put it back on her list yesterday.  Advised her I would forward the refill request to another provider in Dr Karle Starch absence. Next OV 04-24-18.

## 2018-04-21 NOTE — Telephone Encounter (Signed)
Refilled in pcp absence  

## 2018-04-21 NOTE — Telephone Encounter (Signed)
She was seen and discharged from ER Please check on her and set up a follow up with me as soon as practical

## 2018-04-21 NOTE — Telephone Encounter (Signed)
Spoke to pt. Made an appt 04-24-18.

## 2018-04-21 NOTE — Addendum Note (Signed)
Addended by: Eual Fines on: 04/21/2018 09:00 AM   Modules accepted: Orders

## 2018-04-23 DIAGNOSIS — Z96651 Presence of right artificial knee joint: Secondary | ICD-10-CM | POA: Diagnosis not present

## 2018-04-23 DIAGNOSIS — M25561 Pain in right knee: Secondary | ICD-10-CM | POA: Diagnosis not present

## 2018-04-23 DIAGNOSIS — R262 Difficulty in walking, not elsewhere classified: Secondary | ICD-10-CM | POA: Diagnosis not present

## 2018-04-24 ENCOUNTER — Ambulatory Visit (INDEPENDENT_AMBULATORY_CARE_PROVIDER_SITE_OTHER): Payer: Medicare Other | Admitting: Internal Medicine

## 2018-04-24 ENCOUNTER — Encounter: Payer: Self-pay | Admitting: Internal Medicine

## 2018-04-24 VITALS — BP 128/60 | HR 79 | Temp 97.7°F | Ht 63.0 in | Wt 237.0 lb

## 2018-04-24 DIAGNOSIS — M1711 Unilateral primary osteoarthritis, right knee: Secondary | ICD-10-CM

## 2018-04-24 DIAGNOSIS — R5383 Other fatigue: Secondary | ICD-10-CM | POA: Insufficient documentation

## 2018-04-24 DIAGNOSIS — F39 Unspecified mood [affective] disorder: Secondary | ICD-10-CM | POA: Diagnosis not present

## 2018-04-24 NOTE — Assessment & Plan Note (Signed)
Actually doing fairly well post op It takes a while Is at home with help and improving functional status Tramadol now for pain Continuing with rehab outpatient

## 2018-04-24 NOTE — Assessment & Plan Note (Signed)
Incredible stress with the surgery Overall doing better Continue the sertraline

## 2018-04-24 NOTE — Patient Instructions (Signed)
You can try melatonin 1-3 mg at bedtime --to help with sleep.

## 2018-04-24 NOTE — Assessment & Plan Note (Signed)
Discussed reassuring exam and recent labs Can try melatonin for sleep Overall okay

## 2018-04-24 NOTE — Progress Notes (Signed)
Subjective:    Patient ID: Autumn Price, female    DOB: 01-02-1941, 77 y.o.   MRN: [id number]  HPI Here with niece for follow up after ER visit  TKR was 5 weeks ago Was sent to [facility] Only there for 12 hours---never got any medications and wouldn't let her get out of bed Daughter took her out  Did get home therapy Now going for outpatient at [facility] Walking with walker Help with shower Dresses herself, bathroom independently Doing some laundry, feeding the dog Niece is staying with her for now  Still on xarelto Has a few left  Fell last night---legs felt numb after standing off commode Fell forward and hit hit both knees  ER visit due to SOB and was very "shaky" Reviewed notes and labs (benign) Stopped the oxycodone ---may have been the problem Back to the tramadol which she tolerated  Current Outpatient Medications on File Prior to Visit  Medication Sig Dispense Refill  . acetaminophen (TYLENOL) 500 MG tablet Take 500 mg by mouth every 6 (six) hours as needed for mild pain or moderate pain.     Marland Kitchen ALPRAZolam (XANAX) 0.25 MG tablet Take 1 tablet (0.25 mg total) by mouth at bedtime as needed for anxiety. 10 tablet 0  . amLODipine (NORVASC) 5 MG tablet Take 1 tablet (5 mg total) by mouth daily. 30 tablet 11  . baclofen (LIORESAL) 10 MG tablet Take 1 tablet (10 mg total) by mouth 3 (three) times daily as needed for muscle spasms. 50 tablet 1  . diclofenac sodium (VOLTAREN) 1 % GEL Apply 1 application topically daily as needed for pain. knees    . ketoconazole (NIZORAL) 2 % cream Apply 1 application topically 2 (two) times daily. (Patient taking differently: Apply 1 application topically 2 (two) times daily as needed for irritation. ) 60 g 1  . rivaroxaban (XARELTO) 10 MG TABS tablet Take 1 tablet (10 mg total) by mouth daily. 30 tablet 0  . sertraline (ZOLOFT) 50 MG tablet Take 1.5 tablets (75 mg total) by mouth daily. (Patient taking differently: Take 50 mg by  mouth daily. ) 45 tablet 2  . traMADol (ULTRAM) 50 MG tablet Take 1-2 tablets (50-100 mg total) by mouth 2 (two) times daily as needed. 120 tablet 0   No current facility-administered medications on file prior to visit.     No Known Allergies  Past Medical History:  Diagnosis Date  . Depression   . Hypertension   . Osteoarthritis   . Urge incontinence   . Venous insufficiency     Past Surgical History:  Procedure Laterality Date  . APPENDECTOMY  1966  . DILATION AND CURETTAGE OF UTERUS  01/27/14   benign  . TOTAL KNEE ARTHROPLASTY Right 03/20/2018   Procedure: RIGHT TOTAL KNEE ARTHROPLASTY;  Surgeon: Eugenia Mcalpine, MD;  Location: [facility];  Service: Orthopedics;  Laterality: Right;  . TUBAL LIGATION  1966    Family History  Problem Relation Age of Onset  . Heart disease Father        died age 68- MI  . Alcohol abuse Brother   . Heart disease Brother   . Heart disease Sister   . Breast cancer Neg Hx     Social History   Socioeconomic History  . Marital status: Divorced    Spouse name: Not on file  . Number of children: 3  . Years of education: Not on file  . Highest education level: Not on file  Occupational  History  . 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 Financial resource strain: Not on file  . Food insecurity:    Worry: Not on file    Inability: Not on file  . Transportation needs:    Medical: Not on file    Non-medical: Not on file  Tobacco Use  . Smoking status: Never Smoker  . Smokeless tobacco: Never Used  Substance and Sexual Activity  . Alcohol use: No  . Drug use: No  . Sexual activity: Not on file  Lifestyle  . Physical activity:    Days per week: Not on file    Minutes per session: Not on file  . Stress: Not on file  Relationships  . Social connections:    Talks on phone: Not on file    Gets together: Not on file    Attends religious service: Not on file    Active member of club or  organization: Not on file    Attends meetings of clubs or organizations: Not on file    Relationship status: Not on file  . Intimate partner violence:    Fear of current or ex partner: Not on file    Emotionally abused: Not on file    Physically abused: Not on file    Forced sexual activity: Not on file  Other Topics Concern  . Not on file  Social History Narrative   Has living will   Designated son Ronny Flurry to make health care decisions for her   Would accept CPR/resuscitation   Hasn't considered tube feedings--would leave to son   Review of Systems Sleeping better now but went 3 days with no sleep Did try the alprazolam twice--not really helpful Eating okay--but appetite still not great Still "don't have any energy" Did increase the sertraline to  daily    Objective:   Physical Exam  Constitutional: She appears well-developed. No distress.  Neck: No thyromegaly present.  Cardiovascular: Normal rate, regular rhythm and normal heart sounds. Exam reveals no gallop.  No murmur heard. Pulmonary/Chest: Effort normal and breath sounds normal. No respiratory distress. She has no wheezes. She has no rales.  Abdominal: Soft. There is no tenderness.  Lymphadenopathy:    She has no cervical adenopathy.  Skin:  Right knee incision is well healed Slight contusion in mid knee--but no sig inflammation          Assessment & Plan:

## 2018-04-25 LAB — CULTURE, BLOOD (ROUTINE X 2)
Culture: NO GROWTH
Culture: NO GROWTH
Special Requests: ADEQUATE

## 2018-04-28 DIAGNOSIS — M25561 Pain in right knee: Secondary | ICD-10-CM | POA: Diagnosis not present

## 2018-04-28 DIAGNOSIS — R262 Difficulty in walking, not elsewhere classified: Secondary | ICD-10-CM | POA: Diagnosis not present

## 2018-04-28 DIAGNOSIS — Z96651 Presence of right artificial knee joint: Secondary | ICD-10-CM | POA: Diagnosis not present

## 2018-04-30 DIAGNOSIS — Z96651 Presence of right artificial knee joint: Secondary | ICD-10-CM | POA: Diagnosis not present

## 2018-04-30 DIAGNOSIS — M25561 Pain in right knee: Secondary | ICD-10-CM | POA: Diagnosis not present

## 2018-04-30 DIAGNOSIS — R262 Difficulty in walking, not elsewhere classified: Secondary | ICD-10-CM | POA: Diagnosis not present

## 2018-05-05 DIAGNOSIS — Z96651 Presence of right artificial knee joint: Secondary | ICD-10-CM | POA: Diagnosis not present

## 2018-05-05 DIAGNOSIS — R262 Difficulty in walking, not elsewhere classified: Secondary | ICD-10-CM | POA: Diagnosis not present

## 2018-05-05 DIAGNOSIS — M25561 Pain in right knee: Secondary | ICD-10-CM | POA: Diagnosis not present

## 2018-05-07 DIAGNOSIS — M25561 Pain in right knee: Secondary | ICD-10-CM | POA: Diagnosis not present

## 2018-05-07 DIAGNOSIS — R262 Difficulty in walking, not elsewhere classified: Secondary | ICD-10-CM | POA: Diagnosis not present

## 2018-05-07 DIAGNOSIS — Z96651 Presence of right artificial knee joint: Secondary | ICD-10-CM | POA: Diagnosis not present

## 2018-05-12 DIAGNOSIS — Z96651 Presence of right artificial knee joint: Secondary | ICD-10-CM | POA: Diagnosis not present

## 2018-05-12 DIAGNOSIS — M25561 Pain in right knee: Secondary | ICD-10-CM | POA: Diagnosis not present

## 2018-05-12 DIAGNOSIS — R262 Difficulty in walking, not elsewhere classified: Secondary | ICD-10-CM | POA: Diagnosis not present

## 2018-05-14 DIAGNOSIS — R262 Difficulty in walking, not elsewhere classified: Secondary | ICD-10-CM | POA: Diagnosis not present

## 2018-05-14 DIAGNOSIS — M25561 Pain in right knee: Secondary | ICD-10-CM | POA: Diagnosis not present

## 2018-05-14 DIAGNOSIS — Z96651 Presence of right artificial knee joint: Secondary | ICD-10-CM | POA: Diagnosis not present

## 2018-05-19 DIAGNOSIS — Z96651 Presence of right artificial knee joint: Secondary | ICD-10-CM | POA: Diagnosis not present

## 2018-05-19 DIAGNOSIS — M25561 Pain in right knee: Secondary | ICD-10-CM | POA: Diagnosis not present

## 2018-05-19 DIAGNOSIS — R262 Difficulty in walking, not elsewhere classified: Secondary | ICD-10-CM | POA: Diagnosis not present

## 2018-05-21 ENCOUNTER — Other Ambulatory Visit: Payer: Self-pay

## 2018-05-21 MED ORDER — TRAMADOL HCL 50 MG PO TABS: 50.0000 mg | ORAL_TABLET | Freq: Two times a day (BID) | ORAL | 0 refills | Status: DC | PRN

## 2018-05-21 NOTE — Telephone Encounter (Signed)
Last filled 04-21-18 #120 Last OV 04-24-18 Next OV 05-26-18

## 2018-05-26 ENCOUNTER — Ambulatory Visit (INDEPENDENT_AMBULATORY_CARE_PROVIDER_SITE_OTHER): Payer: Medicare Other | Admitting: Internal Medicine

## 2018-05-26 ENCOUNTER — Encounter: Payer: Self-pay | Admitting: Internal Medicine

## 2018-05-26 VITALS — BP 122/68 | HR 84 | Temp 97.3°F | Ht 63.0 in | Wt 235.0 lb

## 2018-05-26 DIAGNOSIS — M25561 Pain in right knee: Secondary | ICD-10-CM | POA: Diagnosis not present

## 2018-05-26 DIAGNOSIS — M170 Bilateral primary osteoarthritis of knee: Secondary | ICD-10-CM | POA: Diagnosis not present

## 2018-05-26 DIAGNOSIS — Z96651 Presence of right artificial knee joint: Secondary | ICD-10-CM | POA: Diagnosis not present

## 2018-05-26 DIAGNOSIS — F39 Unspecified mood [affective] disorder: Secondary | ICD-10-CM

## 2018-05-26 DIAGNOSIS — R262 Difficulty in walking, not elsewhere classified: Secondary | ICD-10-CM | POA: Diagnosis not present

## 2018-05-26 MED ORDER — KETOCONAZOLE 2 % EX CREA: 1.0000 "application " | TOPICAL_CREAM | Freq: Two times a day (BID) | CUTANEOUS | 3 refills | Status: DC | PRN

## 2018-05-26 NOTE — Progress Notes (Signed)
Subjective:    Patient ID: Autumn Price, female    DOB: May 13, 1941, 77 y.o.   MRN: [id number]  HPI Here for follow up after difficulty after TKR  Right knee doing great but having more trouble with the left one Using the tramadol for the left now---right is "so much better" Has restarted the meloxicam again and using the diclofenac gel tid Will consider the other TKR in time Still going to PT   Mood is better now Still gets spells of "real nervous"--- but not as often Lasts for hours at times---still on the sertraline Trying to work through it  Back to living alone Does her instrumental ADLs Driving again  Current Outpatient Medications on File Prior to Visit  Medication Sig Dispense Refill  . acetaminophen (TYLENOL) 500 MG tablet Take 500 mg by mouth every 6 (six) hours as needed for mild pain or moderate pain.     Marland Kitchen. amLODipine (NORVASC) 5 MG tablet Take 1 tablet (5 mg total) by mouth daily. 30 tablet 11  . diclofenac sodium (VOLTAREN) 1 % GEL Apply 1 application topically daily as needed for pain. knees    . ketoconazole (NIZORAL) 2 % cream Apply 1 application topically 2 (two) times daily. (Patient taking differently: Apply 1 application topically 2 (two) times daily as needed for irritation. ) 60 g 1  . sertraline (ZOLOFT) 50 MG tablet Take 1.5 tablets (75 mg total) by mouth daily. (Patient taking differently: Take 50 mg by mouth daily. ) 45 tablet 2  . traMADol (ULTRAM) 50 MG tablet Take 1-2 tablets (50-100 mg total) by mouth 2 (two) times daily as needed. 120 tablet 0   No current facility-administered medications on file prior to visit.     No Known Allergies  Past Medical History:  Diagnosis Date  . Depression   . Hypertension   . Osteoarthritis   . Urge incontinence   . Venous insufficiency     Past Surgical History:  Procedure Laterality Date  . APPENDECTOMY  1966  . DILATION AND CURETTAGE OF UTERUS  01/27/14   benign  . TOTAL KNEE ARTHROPLASTY Right  03/20/2018   Procedure: RIGHT TOTAL KNEE ARTHROPLASTY;  Surgeon: Eugenia Mcalpineollins, Robert, MD;  Location: [facility];  Service: Orthopedics;  Laterality: Right;  . TUBAL LIGATION  1966    Family History  Problem Relation Age of Onset  . Heart disease Father        died age 75- MI  . Alcohol abuse Brother   . Heart disease Brother   . Heart disease Sister   . Breast cancer Neg Hx     Social History   Socioeconomic History  . Marital status: Divorced    Spouse name: Not on file  . Number of children: 3  . Years of education: Not on file  . Highest education level: Not on file  Occupational History  . 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 Financial resource strain: Not on file  . Food insecurity:    Worry: Not on file    Inability: Not on file  . Transportation needs:    Medical: Not on file    Non-medical: Not on file  Tobacco Use  . Smoking status: Never Smoker  . Smokeless tobacco: Never Used  Substance and Sexual Activity  . Alcohol use: No  . Drug use: No  . Sexual activity: Not on file  Lifestyle  . Physical activity:  Days per week: Not on file    Minutes per session: Not on file  . Stress: Not on file  Relationships  . Social connections:    Talks on phone: Not on file    Gets together: Not on file    Attends religious service: Not on file    Active member of club or organization: Not on file    Attends meetings of clubs or organizations: Not on file    Relationship status: Not on file  . Intimate partner violence:    Fear of current or ex partner: Not on file    Emotionally abused: Not on file    Physically abused: Not on file    Forced sexual activity: Not on file  Other Topics Concern  . Not on file  Social History Narrative   Has living will   Designated son Ronny Flurry to make health care decisions for her   Would accept CPR/resuscitation   Hasn't considered tube feedings--would leave to son   Review  of Systems Eating okay Sleep is variable---only 4-5 hours a night. Not new Hasn't tried the melatonin    Objective:   Physical Exam  Constitutional: No distress.  Neck: No thyromegaly present.  Cardiovascular: Normal rate, regular rhythm and normal heart sounds. Exam reveals no gallop.  No murmur heard. Respiratory: Effort normal and breath sounds normal. No respiratory distress. She has no wheezes. She has no rales.  Musculoskeletal: She exhibits no edema.  No calf tenderness  Lymphadenopathy:    She has no cervical adenopathy.  Psychiatric: She has a normal mood and affect. Her behavior is normal.           Assessment & Plan:

## 2018-05-26 NOTE — Assessment & Plan Note (Signed)
Has really done great---driving again after only 2 months Will need to have the left one replaced--she knows it but trying to get herself psyched up for it

## 2018-05-26 NOTE — Assessment & Plan Note (Signed)
Still gets anxiety at times--but is improved Will continue the sertraline without dose change

## 2018-05-28 DIAGNOSIS — R262 Difficulty in walking, not elsewhere classified: Secondary | ICD-10-CM | POA: Diagnosis not present

## 2018-05-28 DIAGNOSIS — M25561 Pain in right knee: Secondary | ICD-10-CM | POA: Diagnosis not present

## 2018-05-28 DIAGNOSIS — Z96651 Presence of right artificial knee joint: Secondary | ICD-10-CM | POA: Diagnosis not present

## 2018-06-15 DIAGNOSIS — Z96651 Presence of right artificial knee joint: Secondary | ICD-10-CM | POA: Diagnosis not present

## 2018-06-15 DIAGNOSIS — M25562 Pain in left knee: Secondary | ICD-10-CM | POA: Diagnosis not present

## 2018-06-22 ENCOUNTER — Other Ambulatory Visit: Payer: Self-pay

## 2018-06-22 MED ORDER — TRAMADOL HCL 50 MG PO TABS: 50.0000 mg | ORAL_TABLET | Freq: Two times a day (BID) | ORAL | 0 refills | Status: DC | PRN

## 2018-06-22 NOTE — Telephone Encounter (Signed)
Last filled 05-21-18 #120 Last OV 05-26-18 Next OV 07-02-18  [facility]

## 2018-07-02 ENCOUNTER — Ambulatory Visit: Payer: Medicare Other | Admitting: Internal Medicine

## 2018-07-12 ENCOUNTER — Encounter: Payer: Self-pay | Admitting: Internal Medicine

## 2018-07-12 LAB — FECAL OCCULT BLOOD, GUAIAC: Fecal Occult Blood: NEGATIVE

## 2018-07-13 ENCOUNTER — Ambulatory Visit (INDEPENDENT_AMBULATORY_CARE_PROVIDER_SITE_OTHER): Payer: Medicare Other | Admitting: Internal Medicine

## 2018-07-13 ENCOUNTER — Encounter: Payer: Self-pay | Admitting: Internal Medicine

## 2018-07-13 ENCOUNTER — Ambulatory Visit: Payer: Medicare Other | Admitting: Internal Medicine

## 2018-07-13 VITALS — BP 122/72 | HR 75 | Temp 98.1°F | Ht 63.0 in | Wt 234.0 lb

## 2018-07-13 DIAGNOSIS — Z01818 Encounter for other preprocedural examination: Secondary | ICD-10-CM | POA: Diagnosis not present

## 2018-07-13 NOTE — Assessment & Plan Note (Signed)
Did very well with right TKR--now planning left EKG in May was fine---no indication to repeat this No symptoms of cardiac or pulmonary disease and no recent illnesses Medically cleared to proceed with planned left TKR

## 2018-07-13 NOTE — Progress Notes (Signed)
Subjective:    Patient ID: Autumn Price, female    DOB: [birth year], 77 y.o.   MRN: [id number]  HPI Here for preoperative clearance for left TKR Dr Thomasena Edis will be doing this one as well  Right knee has completely recovered  She is planning to have someone at home so she doesn't have to go to rehab  No recent illness or fever No cough or SOB No chest pain No palpitations No dizziness or syncope Chronic left > right calf/foot swelling  Current Outpatient Medications on File Prior to Visit  Medication Sig Dispense Refill  . acetaminophen (TYLENOL) 500 MG tablet Take 500 mg by mouth every 6 (six) hours as needed for mild pain or moderate pain.     Marland Kitchen amLODipine (NORVASC) 5 MG tablet Take 1 tablet (5 mg total) by mouth daily. 30 tablet 11  . diclofenac sodium (VOLTAREN) 1 % GEL Apply 1 application topically daily as needed for pain. knees    . ketoconazole (NIZORAL) 2 % cream Apply 1 application topically 2 (two) times daily as needed for irritation. 30 g 3  . sertraline (ZOLOFT) 50 MG tablet Take 1.5 tablets (75 mg total) by mouth daily. (Patient taking differently: Take 50 mg by mouth daily. ) 45 tablet 2  . traMADol (ULTRAM) 50 MG tablet Take 1-2 tablets (50-100 mg total) by mouth 2 (two) times daily as needed. 120 tablet 0   No current facility-administered medications on file prior to visit.     No Known Allergies  Past Medical History:  Diagnosis Date  . Depression   . Hypertension   . Osteoarthritis   . Urge incontinence   . Venous insufficiency     Past Surgical History:  Procedure Laterality Date  . APPENDECTOMY  1966  . DILATION AND CURETTAGE OF UTERUS  01/27/14   benign  . TOTAL KNEE ARTHROPLASTY Right 03/20/2018   Procedure: RIGHT TOTAL KNEE ARTHROPLASTY;  Surgeon: Eugenia Mcalpine, MD;  Location: [facility];  Service: Orthopedics;  Laterality: Right;  . TUBAL LIGATION  1966    Family History  Problem Relation Age of Onset  . Heart disease Father        died  age 31- MI  . Alcohol abuse Brother   . Heart disease Brother   . Heart disease Sister   . Breast cancer Neg Hx     Social History   Socioeconomic History  . Marital status: Divorced    Spouse name: Not on file  . Number of children: 3  . Years of education: Not on file  . Highest education level: Not on file  Occupational History  . 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 Financial resource strain: Not on file  . Food insecurity:    Worry: Not on file    Inability: Not on file  . Transportation needs:    Medical: Not on file    Non-medical: Not on file  Tobacco Use  . Smoking status: Never Smoker  . Smokeless tobacco: Never Used  Substance and Sexual Activity  . Alcohol use: No  . Drug use: No  . Sexual activity: Not on file  Lifestyle  . Physical activity:    Days per week: Not on file    Minutes per session: Not on file  . Stress: Not on file  Relationships  . Social connections:    Talks on phone: Not on file    Gets together: Not  on file    Attends religious service: Not on file    Active member of club or organization: Not on file    Attends meetings of clubs or organizations: Not on file    Relationship status: Not on file  . Intimate partner violence:    Fear of current or ex partner: Not on file    Emotionally abused: Not on file    Physically abused: Not on file    Forced sexual activity: Not on file  Other Topics Concern  . Not on file  Social History Narrative   Has living will   Designated son [name] to make health care decisions for her   Would accept CPR/resuscitation   Hasn't considered tube feedings--would leave to son   Review of Systems Jittery with oxycodone Sleeps okay Appetite is fine Weight stable since the other TKR No mood issues No N/V Bowels are fine    Objective:   Physical Exam  Constitutional: She appears well-developed. No distress.  Neck: No thyromegaly  present.  Cardiovascular: Normal rate, regular rhythm and normal heart sounds. Exam reveals no gallop.  No murmur heard. Respiratory: Effort normal and breath sounds normal. No respiratory distress. She has no wheezes. She has no rales.  GI: Soft. There is no tenderness.  Musculoskeletal:  2+ non pitting edema  Lymphadenopathy:    She has no cervical adenopathy.  Psychiatric: She has a normal mood and affect. Her behavior is normal.           Assessment & Plan:

## 2018-07-22 ENCOUNTER — Other Ambulatory Visit: Payer: Self-pay

## 2018-07-22 MED ORDER — TRAMADOL HCL 50 MG PO TABS: 50.0000 mg | ORAL_TABLET | Freq: Two times a day (BID) | ORAL | 0 refills | Status: DC | PRN

## 2018-07-22 NOTE — Telephone Encounter (Signed)
Last filled 06-22-18 #120 Last OV 07-13-18 Next OV 09-10-18

## 2018-07-27 ENCOUNTER — Ambulatory Visit: Payer: Self-pay | Admitting: Orthopedic Surgery

## 2018-07-27 NOTE — H&P (Addendum)
TOTAL KNEE ADMISSION H&P  Patient is being admitted for left total knee arthroplasty.  Subjective:  Chief Complaint:left knee pain.  HPI: Autumn Price, 77 y.o. female, has a history of pain and functional disability in the left knee due to arthritis and has failed non-surgical conservative treatments for greater than 12 weeks to includecorticosteriod injections, viscosupplementation injections, flexibility and strengthening excercises, use of assistive devices, weight reduction as appropriate and activity modification.  Onset of symptoms was gradual, starting 3 years ago with gradually worsening course since that time. The patient noted no past surgery on the left knee(s).  Patient currently rates pain in the left knee(s) at 9 out of 10 with activity. Patient has night pain, worsening of pain with activity and weight bearing, pain that interferes with activities of daily living, pain with passive range of motion, crepitus and joint swelling.  Patient has evidence of subchondral cysts, subchondral sclerosis, periarticular osteophytes and joint space narrowing by imaging studies.  There is no active infection.  Patient Active Problem List   Diagnosis Date Noted  . Preoperative evaluation to rule out surgical contraindication 07/13/2018  . Fatigue 04/24/2018  . Primary osteoarthritis of right knee 03/20/2018  . S/P knee replacement 03/20/2018  . Osteoarthritis of both knees 08/30/2016  . Advance directive discussed with patient 08/04/2015  . Mood disorder (HCC) 06/10/2014  . BMI 40.0-44.9, adult (HCC) 05/22/2012  . Routine general medical examination at a health care facility 03/19/2011  . Urge incontinence 03/19/2011  . Essential hypertension, benign 08/21/2010  . Chronic venous insufficiency 08/21/2010  . Osteoarthritis, multiple sites 08/21/2010   Past Medical History:  Diagnosis Date  . Depression   . Hypertension   . Osteoarthritis   . Urge incontinence   . Venous insufficiency      Past Surgical History:  Procedure Laterality Date  . APPENDECTOMY  1966  . DILATION AND CURETTAGE OF UTERUS  01/27/14   benign  . TOTAL KNEE ARTHROPLASTY Right 03/20/2018   Procedure: RIGHT TOTAL KNEE ARTHROPLASTY;  Surgeon: Eugenia Mcalpineollins, Robert, MD;  Location: [facility];  Service: Orthopedics;  Laterality: Right;  . TUBAL LIGATION  1966    No current facility-administered medications for this encounter.    Current Outpatient Medications  Medication Sig Dispense Refill Last Dose  . acetaminophen (TYLENOL) 500 MG tablet Take 500 mg by mouth every 6 (six) hours as needed for mild pain or moderate pain.    Taking  . amLODipine (NORVASC) 5 MG tablet Take 1 tablet (5 mg total) by mouth daily. 30 tablet 11 Taking  . diclofenac sodium (VOLTAREN) 1 % GEL Apply 1 application topically daily as needed for pain. knees   Taking  . ketoconazole (NIZORAL) 2 % cream Apply 1 application topically 2 (two) times daily as needed for irritation. 30 g 3 Taking  . sertraline (ZOLOFT) 50 MG tablet Take 1.5 tablets (75 mg total) by mouth daily. (Patient taking differently: Take 50 mg by mouth daily. ) 45 tablet 2 Taking  . traMADol (ULTRAM) 50 MG tablet Take 1-2 tablets (50-100 mg total) by mouth 2 (two) times daily as needed. 120 tablet 0    No Known Allergies  Social History   Tobacco Use  . Smoking status: Never Smoker  . Smokeless tobacco: Never Used  Substance Use Topics  . Alcohol use: No    Family History  Problem Relation Age of Onset  . Heart disease Father        died age 75- MI  . Alcohol abuse  Brother   . Heart disease Brother   . Heart disease Sister   . Breast cancer Neg Hx      Review of Systems  Constitutional: Negative.   HENT: Negative.   Eyes: Negative.   Respiratory: Negative.   Cardiovascular: Negative.   Gastrointestinal: Negative.   Genitourinary: Negative.   Musculoskeletal: Positive for joint pain.  Skin: Negative.   Neurological: Negative.   Endo/Heme/Allergies:  Negative.   Psychiatric/Behavioral: Negative.     Objective:  Physical Exam  Constitutional: She is oriented to person, place, and time. She appears well-developed.  HENT:  Head: Normocephalic.  Eyes: EOM are normal.  Neck: Normal range of motion.  Cardiovascular: Normal rate and intact distal pulses.  Respiratory: Effort normal.  GI: Soft.  Musculoskeletal:  Left knee pain with ROM and WB. Knee is stable. Calf is soft and non tender.   Neurological: She is alert and oriented to person, place, and time.  Skin: Skin is warm and dry.  Psychiatric: Her behavior is normal.    Vital signs in last 24 hours: BP: ()/()  Arterial Line BP: ()/()   Labs:   Estimated body mass index is 41.45 kg/m as calculated from the following:   Height as of 07/13/18: 5\' 3"  (1.6 m).   Weight as of 07/13/18: 106.1 kg.   Imaging Review Plain radiographs demonstrate severe degenerative joint disease of the left knee(s). The overall alignment ismild varus. The bone quality appears to be good for age and reported activity level.   Preoperative templating of the joint replacement has been completed, documented, and submitted to the Operating Room personnel in order to optimize intra-operative equipment management.   Anticipated LOS equal to or greater than 2 midnights due to - Age 75 and older with one or more of the following:  - Obesity  - Expected need for hospital services (PT, OT, Nursing) required for safe  discharge  - Anticipated need for postoperative skilled nursing care or inpatient rehab  - Active co-morbidities: None OR   - Unanticipated findings during/Post Surgery: None  - Patient is a high risk of re-admission due to: None     Assessment/Plan:  End stage arthritis, left knee   The patient history, physical examination, clinical judgment of the provider and imaging studies are consistent with end stage degenerative joint disease of the left knee(s) and total knee arthroplasty  is deemed medically necessary. The treatment options including medical management, injection therapy arthroscopy and arthroplasty were discussed at length. The risks and benefits of total knee arthroplasty were presented and reviewed. The risks due to aseptic loosening, infection, stiffness, patella tracking problems, thromboembolic complications and other imponderables were discussed. The patient acknowledged the explanation, agreed to proceed with the plan and consent was signed. Patient is being admitted for inpatient treatment for surgery, pain control, PT, OT, prophylactic antibiotics, VTE prophylaxis, progressive ambulation and ADL's and discharge planning. The patient is planning to be discharged home with home health services.

## 2018-07-31 ENCOUNTER — Encounter: Payer: Self-pay | Admitting: Internal Medicine

## 2018-08-07 ENCOUNTER — Other Ambulatory Visit (HOSPITAL_COMMUNITY): Payer: Medicare Other

## 2018-08-12 ENCOUNTER — Ambulatory Visit: Payer: Self-pay | Admitting: Orthopedic Surgery

## 2018-08-14 ENCOUNTER — Encounter: Payer: Medicare Other | Admitting: Internal Medicine

## 2018-08-21 ENCOUNTER — Other Ambulatory Visit: Payer: Self-pay

## 2018-08-21 ENCOUNTER — Other Ambulatory Visit: Payer: Self-pay | Admitting: Internal Medicine

## 2018-08-21 MED ORDER — AMLODIPINE BESYLATE 5 MG PO TABS: 5.0000 mg | ORAL_TABLET | Freq: Every day | ORAL | 11 refills | Status: DC

## 2018-08-21 MED ORDER — TRAMADOL HCL 50 MG PO TABS: 50.0000 mg | ORAL_TABLET | Freq: Two times a day (BID) | ORAL | 0 refills | Status: DC | PRN

## 2018-08-21 NOTE — Addendum Note (Signed)
Addended by: Lannie Fields on: 08/21/2018 10:06 AM   Modules accepted: Orders

## 2018-08-21 NOTE — Telephone Encounter (Signed)
Name of Medication: Tramadol 50 mg Name of Pharmacy: [facility] Last [place] or Written Date and Quantity: # 120 on 07/22/18 Last Office Visit and Type: 07/13/18 Next Office Visit and Type: 09/10/18 on [facility]

## 2018-08-21 NOTE — Telephone Encounter (Signed)
Last filled 07-22-18 #120 Last OV 07-13-18 Next OV 09-10-18

## 2018-08-21 NOTE — Telephone Encounter (Signed)
Copied from CRM [id number]. Topic: Quick Communication - See Telephone Encounter >> Aug 21, 2018  9:59 AM Jolayne Haines L wrote: CRM for notification. See Telephone encounter for: 08/21/18.  traMADol (ULTRAM) 50 MG tablet  [facility]

## 2018-08-21 NOTE — Telephone Encounter (Signed)
Ultram 50 mg refill Last Refill:07/22/18 # 120 Last OV: 07/13/18 PCP: Alphonsus Sias Pharmacy:[facility]

## 2018-08-26 MED ORDER — TRANEXAMIC ACID 1000 MG/10ML IV SOLN: 1000.0000 mg | INTRAVENOUS | Status: DC

## 2018-08-31 NOTE — Patient Instructions (Signed)
Autumn Price  08/31/2018   Your procedure is scheduled on: Friday 09/11/2018  [facility]              Report to admitting at  0530  AM     Call this number if you have problems the morning of surgery [phone]    Remember: Do not eat food or drink liquids :After Midnight.   BRUSH YOUR TEETH MORNING OF SURGERY AND RINSE YOUR MOUTH OUT, NO CHEWING GUM CANDY OR MINTS.     Take these medicines the morning of surgery with A SIP OF WATER: Amlodipine (Norvasc), Sertraline (Zoloft)                                You may not have any metal on your body including hair pins and              piercings  Do not wear jewelry, make-up, lotions, powders or perfumes, deodorant             Do not wear nail polish.  Do not shave  48 hours prior to surgery.            Do not bring valuables to the hospital. [place] IS NOT             RESPONSIBLE   FOR VALUABLES.  Contacts, dentures or bridgework may not be worn into surgery.  Leave suitcase in the car. After surgery it may be brought to your room.                   Please read over the following fact sheets you were given: _____________________________________________________________________             [facility] - Preparing for Surgery Before surgery, you can play an important role.  Because skin is not sterile, your skin needs to be as free of germs as possible.  You can reduce the number of germs on your skin by washing with CHG (chlorahexidine gluconate) soap before surgery.  CHG is an antiseptic cleaner which kills germs and bonds with the skin to continue killing germs even after washing. Please DO NOT use if you have an allergy to CHG or antibacterial soaps.  If your skin becomes reddened/irritated stop using the CHG and inform your nurse when you arrive at Short Stay. Do not shave (including legs and underarms) for at least 48 hours prior to the first CHG shower.  You may shave  your face/neck. Please follow these instructions carefully:  1.  Shower with CHG Soap the night before surgery and the  morning of Surgery.  2.  If you choose to wash your hair, wash your hair first as usual with your  normal  shampoo.  3.  After you shampoo, rinse your hair and body thoroughly to remove the  shampoo.                           4.  Use CHG as you would any other liquid soap.  You can apply chg directly  to the skin and wash                       Gently with a scrungie or clean washcloth.  5.  Apply the CHG Soap to your body ONLY FROM THE NECK DOWN.   Do not use on face/ open                           Wound or open sores. Avoid contact with eyes, ears mouth and genitals (private parts).                       Wash face,  Genitals (private parts) with your normal soap.             6.  Wash thoroughly, paying special attention to the area where your surgery  will be performed.  7.  Thoroughly rinse your body with warm water from the neck down.  8.  DO NOT shower/wash with your normal soap after using and rinsing off  the CHG Soap.                9.  Pat yourself dry with a clean towel.            10.  Wear clean pajamas.            11.  Place clean sheets on your bed the night of your first shower and do not  sleep with pets. Day of Surgery : Do not apply any lotions/deodorants the morning of surgery.  Please wear clean clothes to the hospital/surgery center.  FAILURE TO FOLLOW THESE INSTRUCTIONS MAY RESULT IN THE CANCELLATION OF YOUR SURGERY PATIENT SIGNATURE_________________________________  NURSE SIGNATURE__________________________________  ________________________________________________________________________   Adam Phenix  An incentive spirometer is a tool that can help keep your lungs clear and active. This tool measures how well you are filling your lungs with each breath. Taking long deep breaths may help reverse or decrease the chance of developing  breathing (pulmonary) problems (especially infection) following:  A long period of time when you are unable to move or be active. BEFORE THE PROCEDURE   If the spirometer includes an indicator to show your best effort, your nurse or respiratory therapist will set it to a desired goal.  If possible, sit up straight or lean slightly forward. Try not to slouch.  Hold the incentive spirometer in an upright position. INSTRUCTIONS FOR USE  1. Sit on the edge of your bed if possible, or sit up as far as you can in bed or on a chair. 2. Hold the incentive spirometer in an upright position. 3. Breathe out normally. 4. Place the mouthpiece in your mouth and seal your lips tightly around it. 5. Breathe in slowly and as deeply as possible, raising the piston or the ball toward the top of the column. 6. Hold your breath for 3-5 seconds or for as long as possible. Allow the piston or ball to fall to the bottom of the column. 7. Remove the mouthpiece from your mouth and breathe out normally. 8. Rest for a few seconds and repeat Steps 1 through 7 at least 10 times every 1-2 hours when you are awake. Take your time and take a few normal breaths between deep breaths. 9. The spirometer may include an indicator to show your best effort. Use the indicator as a goal to work toward during each repetition. 10. After each set of 10 deep breaths, practice coughing to be sure your lungs are clear. If you have an incision (the cut made at the time of surgery), support your incision when coughing by placing a pillow or  rolled up towels firmly against it. Once you are able to get out of bed, walk around indoors and cough well. You may stop using the incentive spirometer when instructed by your caregiver.  RISKS AND COMPLICATIONS  Take your time so you do not get dizzy or light-headed.  If you are in pain, you may need to take or ask for pain medication before doing incentive spirometry. It is harder to take a deep  breath if you are having pain. AFTER USE  Rest and breathe slowly and easily.  It can be helpful to keep track of a log of your progress. Your caregiver can provide you with a simple table to help with this. If you are using the spirometer at home, follow these instructions: [place] IF:   You are having difficultly using the spirometer.  You have trouble using the spirometer as often as instructed.  Your pain medication is not giving enough relief while using the spirometer.  You develop fever of 100.5 F (38.1 C) or higher. SEEK IMMEDIATE MEDICAL CARE IF:   You cough up bloody sputum that had not been present before.  You develop fever of 102 F (38.9 C) or greater.  You develop worsening pain at or near the incision site. MAKE SURE YOU:   Understand these instructions.  Will watch your condition.  Will get help right away if you are not doing well or get worse. Document Released: 04/14/2007 Document Revised: 02/24/2012 Document Reviewed: 06/15/2007 [facility] Patient Information 2014 [place], [place].   ________________________________________________________________________

## 2018-08-31 NOTE — Progress Notes (Signed)
07/13/18- Medical Clearance on chart from Dr. Alphonsus SiasLetvak with Last office visit.  04/20/2018- noted in Epic-EKG and CXR

## 2018-09-04 ENCOUNTER — Other Ambulatory Visit: Payer: Self-pay

## 2018-09-04 ENCOUNTER — Encounter (HOSPITAL_COMMUNITY): Payer: Self-pay

## 2018-09-04 ENCOUNTER — Encounter (HOSPITAL_COMMUNITY)
Admission: RE | Admit: 2018-09-04 | Discharge: 2018-09-04 | Disposition: A | Discharge status: Home / Self Care | Payer: Medicare Other | Source: Ambulatory Visit | Attending: Specialist | Admitting: Specialist

## 2018-09-04 DIAGNOSIS — M1712 Unilateral primary osteoarthritis, left knee: Secondary | ICD-10-CM | POA: Diagnosis not present

## 2018-09-04 DIAGNOSIS — Z01812 Encounter for preprocedural laboratory examination: Secondary | ICD-10-CM | POA: Diagnosis not present

## 2018-09-04 LAB — BASIC METABOLIC PANEL WITH GFR
Anion gap: 10 (ref 5–15)
BUN: 18 mg/dL (ref 8–23)
CO2: 28 mmol/L (ref 22–32)
Calcium: 9.8 mg/dL (ref 8.9–10.3)
Chloride: 106 mmol/L (ref 98–111)
Creatinine, Ser: 0.78 mg/dL (ref 0.44–1.00)
GFR calc Af Amer: >60 mL/min
GFR calc non Af Amer: >60 mL/min
Glucose, Bld: 101 mg/dL — ABNORMAL HIGH (ref 70–99)
Potassium: 4.2 mmol/L (ref 3.5–5.1)
Sodium: 144 mmol/L (ref 135–145)

## 2018-09-04 LAB — URINALYSIS, ROUTINE W REFLEX MICROSCOPIC
Bilirubin Urine: NEGATIVE
Glucose, UA: NEGATIVE mg/dL
Hgb urine dipstick: NEGATIVE
Ketones, ur: NEGATIVE mg/dL
Nitrite: POSITIVE — AB
Protein, ur: NEGATIVE mg/dL
Specific Gravity, Urine: 1.02 (ref 1.005–1.030)
pH: 5 (ref 5.0–8.0)

## 2018-09-04 LAB — SURGICAL PCR SCREEN
MRSA, PCR: NEGATIVE
Staphylococcus aureus: NEGATIVE

## 2018-09-04 LAB — CBC
HCT: 44.6 % (ref 36.0–46.0)
Hemoglobin: 14 g/dL (ref 12.0–15.0)
MCH: 28 pg (ref 26.0–34.0)
MCHC: 31.4 g/dL (ref 30.0–36.0)
MCV: 89.2 fL (ref 78.0–100.0)
Platelets: 160 10*3/uL (ref 150–400)
RBC: 5 MIL/uL (ref 3.87–5.11)
RDW: 14.4 % (ref 11.5–15.5)
WBC: 5 10*3/uL (ref 4.0–10.5)

## 2018-09-04 LAB — PROTIME-INR
INR: 0.98
Prothrombin Time: 12.8 s (ref 11.4–15.2)

## 2018-09-04 LAB — APTT: aPTT: 38 s — ABNORMAL HIGH (ref 24–36)

## 2018-09-07 ENCOUNTER — Other Ambulatory Visit: Payer: Self-pay

## 2018-09-07 MED ORDER — KETOCONAZOLE 2 % EX CREA: 1.0000 "application " | TOPICAL_CREAM | Freq: Two times a day (BID) | CUTANEOUS | 3 refills | Status: DC | PRN

## 2018-09-10 ENCOUNTER — Encounter: Payer: Self-pay | Admitting: Internal Medicine

## 2018-09-10 ENCOUNTER — Encounter (HOSPITAL_COMMUNITY): Payer: Self-pay | Admitting: Anesthesiology

## 2018-09-10 ENCOUNTER — Ambulatory Visit (INDEPENDENT_AMBULATORY_CARE_PROVIDER_SITE_OTHER): Payer: Medicare Other | Admitting: Internal Medicine

## 2018-09-10 VITALS — BP 120/78 | HR 88 | Temp 97.7°F | Ht 63.0 in | Wt 234.0 lb

## 2018-09-10 DIAGNOSIS — F39 Unspecified mood [affective] disorder: Secondary | ICD-10-CM

## 2018-09-10 DIAGNOSIS — I872 Venous insufficiency (chronic) (peripheral): Secondary | ICD-10-CM | POA: Diagnosis not present

## 2018-09-10 DIAGNOSIS — I10 Essential (primary) hypertension: Secondary | ICD-10-CM | POA: Diagnosis not present

## 2018-09-10 DIAGNOSIS — M170 Bilateral primary osteoarthritis of knee: Secondary | ICD-10-CM | POA: Diagnosis not present

## 2018-09-10 DIAGNOSIS — Z0000 Encounter for general adult medical examination without abnormal findings: Secondary | ICD-10-CM

## 2018-09-10 DIAGNOSIS — Z7189 Other specified counseling: Secondary | ICD-10-CM

## 2018-09-10 NOTE — Anesthesia Preprocedure Evaluation (Addendum)
Anesthesia Evaluation  Patient identified by MRN, date of birth, ID band Patient awake    Reviewed: Allergy & Precautions, NPO status , Patient's Chart, lab work & pertinent test results  Airway Mallampati: III  TM Distance: >3 FB Neck ROM: Full    Dental no notable dental hx. (+) Caps,    Pulmonary neg pulmonary ROS,    Pulmonary exam normal breath sounds clear to auscultation       Cardiovascular hypertension, Pt. on medications + Peripheral Vascular Disease  Normal cardiovascular exam Rhythm:Regular Rate:Normal  Chronic venous insufficiency   Neuro/Psych PSYCHIATRIC DISORDERS Depression Mood disorder negative neurological ROS     GI/Hepatic negative GI ROS, Neg liver ROS,   Endo/Other  Morbid obesity  Renal/GU negative Renal ROS Bladder dysfunction  Urinary incontinence    Musculoskeletal  (+) Arthritis , Osteoarthritis,  Left knee OA   Abdominal (+) + obese,   Peds  Hematology negative hematology ROS (+)   Anesthesia Other Findings   Reproductive/Obstetrics                            Anesthesia Physical  Anesthesia Plan  ASA: III  Anesthesia Plan: Spinal   Post-op Pain Management:  Regional for Post-op pain   Induction:   PONV Risk Score and Plan: 3 and Ondansetron, Dexamethasone, Midazolam and Treatment may vary due to age or medical condition  Airway Management Planned: Natural Airway, Nasal Cannula and Simple Face Mask  Additional Equipment:   Intra-op Plan:   Post-operative Plan:   Informed Consent: I have reviewed the patients History and Physical, chart, labs and discussed the procedure including the risks, benefits and alternatives for the proposed anesthesia with the patient or authorized representative who has indicated his/her understanding and acceptance.   Dental advisory given  Plan Discussed with: CRNA, Anesthesiologist and Surgeon  Anesthesia Plan  Comments:         Anesthesia Quick Evaluation

## 2018-09-10 NOTE — Assessment & Plan Note (Signed)
Hopefully can exercise more and lose weight after other TKR

## 2018-09-10 NOTE — Assessment & Plan Note (Signed)
See social history 

## 2018-09-10 NOTE — Assessment & Plan Note (Signed)
Will restart support hose after the surgery

## 2018-09-10 NOTE — Assessment & Plan Note (Signed)
BP Readings from Last 3 Encounters:  09/10/18 120/78  09/04/18 (!) 157/63  07/13/18 122/72   Good control Recent labs fine

## 2018-09-10 NOTE — Assessment & Plan Note (Signed)
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

## 2018-09-10 NOTE — Assessment & Plan Note (Signed)
Did well with right TKR and now getting left tomorrow

## 2018-09-10 NOTE — Progress Notes (Signed)
Hearing Screening   Method: Audiometry   125Hz  250Hz  500Hz  1000Hz  2000Hz  3000Hz  4000Hz  6000Hz  8000Hz   Right ear:   40 40 40  40    Left ear:   40 40 0  0      Visual Acuity Screening   Right eye Left eye Both eyes  Without correction:     With correction: 20/30 20/30 20/20

## 2018-09-10 NOTE — Assessment & Plan Note (Signed)
I have personally reviewed the Medicare Annual Wellness questionnaire and have noted 1. The patient's medical and social history 2. Their use of alcohol, tobacco or illicit drugs 3. Their current medications and supplements 4. The patient's functional ability including ADL's, fall risks, home safety risks and hearing or visual             impairment. 5. Diet and physical activities 6. Evidence for depression or mood disorders  The patients weight, height, BMI and visual acuity have been recorded in the chart I have made referrals, counseling and provided education to the patient based review of the above and I have provided the pt with a written personalized care plan for preventive services.  I have provided you with a copy of your personalized plan for preventive services. Please take the time to review along with your updated medication list.  Flu vaccine after her surgery FIT yearly--done recently  Mammogram due in November Hopefully should be able to exercise more after TKR

## 2018-09-10 NOTE — Progress Notes (Signed)
Subjective:    Patient ID: Autumn Price, female    DOB: 12/26/1940, 77 y.o.   MRN: [id number]  HPI Here for Medicare wellness visit and follow up of chronic medical conditions Reviewed form and advanced directives Reviewed other doctors No alcohol or tobacco Has been doing knee rehab home exercises No falls Mild mood problems persists Vision is fine. Hearing is okay ---- she doesn't notice any problems Independent with instrumental ADLs No sig memory issues  Did well with right TKR Will have the left done tomorrow--got postponed  Mood is better Still on the medication---gets down at times Appetite is normal for her  No chest pain No SOB No dizziness or syncope No edema No palpitations  Current Outpatient Medications on File Prior to Visit  Medication Sig Dispense Refill  . acetaminophen (TYLENOL) 500 MG tablet Take 500 mg by mouth every 6 (six) hours as needed for mild pain or moderate pain.     Marland Kitchen amLODipine (NORVASC) 5 MG tablet Take 1 tablet (5 mg total) by mouth daily. 30 tablet 11  . diclofenac sodium (VOLTAREN) 1 % GEL Apply 1 application topically daily as needed for pain. knees    . ketoconazole (NIZORAL) 2 % cream Apply 1 application topically 2 (two) times daily as needed for irritation. 30 g 3  . sertraline (ZOLOFT) 50 MG tablet Take 1.5 tablets (75 mg total) by mouth daily. 45 tablet 2  . traMADol (ULTRAM) 50 MG tablet Take 1-2 tablets (50-100 mg total) by mouth 2 (two) times daily as needed. (Patient taking differently: Take 100 mg by mouth 2 (two) times daily as needed for moderate pain. ) 120 tablet 0   No current facility-administered medications on file prior to visit.     No Known Allergies  Past Medical History:  Diagnosis Date  . Depression   . Hypertension   . Osteoarthritis   . Urge incontinence   . Venous insufficiency     Past Surgical History:  Procedure Laterality Date  . APPENDECTOMY  1966  . DILATION AND CURETTAGE OF UTERUS   01/27/14   benign  . JOINT REPLACEMENT     right knee  . TOTAL KNEE ARTHROPLASTY Right 03/20/2018   Procedure: RIGHT TOTAL KNEE ARTHROPLASTY;  Surgeon: Eugenia Mcalpine, MD;  Location: [facility];  Service: Orthopedics;  Laterality: Right;  . TUBAL LIGATION  1966    Family History  Problem Relation Age of Onset  . Heart disease Father        died age 105- MI  . Alcohol abuse Brother   . Heart disease Brother   . Heart disease Sister   . Breast cancer Neg Hx     Social History   Socioeconomic History  . Marital status: Divorced    Spouse name: Not on file  . Number of children: 3  . Years of education: Not on file  . Highest education level: Not on file  Occupational History  . 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 Financial resource strain: Not on file  . Food insecurity:    Worry: Not on file    Inability: Not on file  . Transportation needs:    Medical: Not on file    Non-medical: Not on file  Tobacco Use  . Smoking status: Never Smoker  . Smokeless tobacco: Never Used  Substance and Sexual Activity  . Alcohol use: No  . Drug use: No  . Sexual activity:  Not on file  Lifestyle  . Physical activity:    Days per week: Not on file    Minutes per session: Not on file  . Stress: Not on file  Relationships  . Social connections:    Talks on phone: Not on file    Gets together: Not on file    Attends religious service: Not on file    Active member of club or organization: Not on file    Attends meetings of clubs or organizations: Not on file    Relationship status: Not on file  . Intimate partner violence:    Fear of current or ex partner: Not on file    Emotionally abused: Not on file    Physically abused: Not on file    Forced sexual activity: Not on file  Other Topics Concern  . Not on file  Social History Narrative   Has living will   Designated son Ronny Flurry and other children to make health care decisions  for her   Would accept CPR/resuscitation   Hasn't considered tube feedings--would leave to son   Review of Systems  Weight stable Sleeps okay most of the time Wears seat belt Teeth are fine---keeps up with dentist No skin rash or lesions No heartburn or dysphagia Bowels are good--no blood No dysuria or hematuria. Wears pad for incontinence No other joint pain other than the back (and some on left shoulder she relates to pressure from using the walker)    Objective:   Physical Exam  Constitutional: She is oriented to person, place, and time. She appears well-developed. No distress.  HENT:  Mouth/Throat: Oropharynx is clear and moist. No oropharyngeal exudate.  Neck: No thyromegaly present.  Cardiovascular: Normal rate, regular rhythm, normal heart sounds and intact distal pulses. Exam reveals no gallop.  No murmur heard. Respiratory: Effort normal and breath sounds normal. No respiratory distress. She has no wheezes. She has no rales.  GI: Soft. There is no tenderness.  Musculoskeletal:  2+ non pitting edema  Lymphadenopathy:    She has no cervical adenopathy.  Neurological: She is alert and oriented to person, place, and time.  President--- "[place], [place]" [phone] D-[id number] Recall 3/3  Skin: No rash noted. No erythema.  Psychiatric: She has a normal mood and affect. Her behavior is normal.           Assessment & Plan:

## 2018-09-11 ENCOUNTER — Encounter (HOSPITAL_COMMUNITY): Payer: Self-pay | Admitting: Emergency Medicine

## 2018-09-11 ENCOUNTER — Other Ambulatory Visit: Payer: Self-pay

## 2018-09-11 ENCOUNTER — Inpatient Hospital Stay (HOSPITAL_COMMUNITY)
Admission: RE | Admit: 2018-09-11 | Discharge: 2018-09-13 | DRG: 470 | Disposition: A | Discharge status: Home Health | Payer: Medicare Other | Attending: Specialist | Admitting: Specialist

## 2018-09-11 ENCOUNTER — Encounter (HOSPITAL_COMMUNITY)
Admission: RE | Disposition: A | Discharge status: Home Health | Payer: Self-pay | Source: Home / Self Care | Attending: Specialist

## 2018-09-11 ENCOUNTER — Inpatient Hospital Stay (HOSPITAL_COMMUNITY): Payer: Medicare Other | Admitting: Anesthesiology

## 2018-09-11 DIAGNOSIS — Z79899 Other long term (current) drug therapy: Secondary | ICD-10-CM

## 2018-09-11 DIAGNOSIS — Z6841 Body Mass Index (BMI) 40.0 and over, adult: Secondary | ICD-10-CM

## 2018-09-11 DIAGNOSIS — Z96651 Presence of right artificial knee joint: Secondary | ICD-10-CM | POA: Diagnosis present

## 2018-09-11 DIAGNOSIS — M1712 Unilateral primary osteoarthritis, left knee: Principal | ICD-10-CM | POA: Diagnosis present

## 2018-09-11 DIAGNOSIS — I10 Essential (primary) hypertension: Secondary | ICD-10-CM | POA: Diagnosis present

## 2018-09-11 DIAGNOSIS — G8918 Other acute postprocedural pain: Secondary | ICD-10-CM | POA: Diagnosis not present

## 2018-09-11 DIAGNOSIS — M25562 Pain in left knee: Secondary | ICD-10-CM | POA: Diagnosis present

## 2018-09-11 DIAGNOSIS — Z96659 Presence of unspecified artificial knee joint: Secondary | ICD-10-CM

## 2018-09-11 DIAGNOSIS — F329 Major depressive disorder, single episode, unspecified: Secondary | ICD-10-CM | POA: Diagnosis present

## 2018-09-11 HISTORY — PX: TOTAL KNEE ARTHROPLASTY: SHX125

## 2018-09-11 LAB — TYPE AND SCREEN
ABO/RH(D): O POS
Antibody Screen: NEGATIVE

## 2018-09-11 SURGERY — ARTHROPLASTY, KNEE, TOTAL
Anesthesia: Spinal | Site: Knee | Laterality: Left

## 2018-09-11 MED ORDER — BISACODYL 5 MG PO TBEC: 5.0000 mg | DELAYED_RELEASE_TABLET | Freq: Every day | ORAL | Status: DC | PRN

## 2018-09-11 MED ORDER — CHLORHEXIDINE GLUCONATE 4 % EX LIQD: 60.0000 mL | Freq: Once | CUTANEOUS | Status: DC

## 2018-09-11 MED ORDER — KETOCONAZOLE 2 % EX CREA
1.0000 "application " | TOPICAL_CREAM | Freq: Two times a day (BID) | CUTANEOUS | Status: DC | PRN
  Filled 2018-09-11: qty 15

## 2018-09-11 MED ORDER — BUPIVACAINE-EPINEPHRINE 0.25% -1:200000 IJ SOLN
INTRAMUSCULAR | Status: DC | PRN
  Administered 2018-09-11: 30 mL

## 2018-09-11 MED ORDER — ALUM & MAG HYDROXIDE-SIMETH 200-200-20 MG/5ML PO SUSP: 30.0000 mL | ORAL | Status: DC | PRN

## 2018-09-11 MED ORDER — TRANEXAMIC ACID 1000 MG/10ML IV SOLN
INTRAVENOUS | Status: AC
  Filled 2018-09-11: qty 10

## 2018-09-11 MED ORDER — STERILE WATER FOR IRRIGATION IR SOLN
Status: DC | PRN
  Administered 2018-09-11: 2000 mL

## 2018-09-11 MED ORDER — DEXAMETHASONE SODIUM PHOSPHATE 10 MG/ML IJ SOLN
10.0000 mg | Freq: Once | INTRAMUSCULAR | Status: AC
  Administered 2018-09-12: 10 mg via INTRAVENOUS
  Filled 2018-09-11: qty 1

## 2018-09-11 MED ORDER — PROPOFOL 10 MG/ML IV BOLUS
INTRAVENOUS | Status: AC
  Filled 2018-09-11: qty 40

## 2018-09-11 MED ORDER — PROPOFOL 500 MG/50ML IV EMUL
INTRAVENOUS | Status: DC | PRN
  Administered 2018-09-11: 75 ug/kg/min via INTRAVENOUS

## 2018-09-11 MED ORDER — SODIUM CHLORIDE 0.9 % IR SOLN
Status: DC | PRN
  Administered 2018-09-11: 1000 mL

## 2018-09-11 MED ORDER — METHOCARBAMOL 500 MG IVPB - SIMPLE MED
INTRAVENOUS | Status: AC
  Filled 2018-09-11: qty 50

## 2018-09-11 MED ORDER — ACETAMINOPHEN 500 MG PO TABS
1000.0000 mg | ORAL_TABLET | Freq: Four times a day (QID) | ORAL | Status: AC
  Administered 2018-09-11 – 2018-09-12 (×4): 1000 mg via ORAL
  Filled 2018-09-11 (×4): qty 2

## 2018-09-11 MED ORDER — SERTRALINE HCL 50 MG PO TABS
75.0000 mg | ORAL_TABLET | Freq: Every day | ORAL | Status: DC
  Administered 2018-09-12 – 2018-09-13 (×2): 75 mg via ORAL
  Filled 2018-09-11 (×2): qty 1

## 2018-09-11 MED ORDER — METHOCARBAMOL 500 MG IVPB - SIMPLE MED
500.0000 mg | Freq: Four times a day (QID) | INTRAVENOUS | Status: DC | PRN
  Administered 2018-09-11: 500 mg via INTRAVENOUS
  Filled 2018-09-11: qty 50

## 2018-09-11 MED ORDER — ONDANSETRON HCL 4 MG/2ML IJ SOLN
INTRAMUSCULAR | Status: AC
  Filled 2018-09-11: qty 2

## 2018-09-11 MED ORDER — ONDANSETRON HCL 4 MG/2ML IJ SOLN: 4.0000 mg | Freq: Four times a day (QID) | INTRAMUSCULAR | Status: DC | PRN

## 2018-09-11 MED ORDER — OXYCODONE HCL 5 MG PO TABS
10.0000 mg | ORAL_TABLET | ORAL | Status: DC | PRN
  Administered 2018-09-12: 15 mg via ORAL
  Administered 2018-09-12: 10 mg via ORAL
  Administered 2018-09-13 (×2): 15 mg via ORAL
  Administered 2018-09-13: 10 mg via ORAL
  Filled 2018-09-11: qty 2
  Filled 2018-09-11: qty 3
  Filled 2018-09-11: qty 2
  Filled 2018-09-11 (×2): qty 3

## 2018-09-11 MED ORDER — CEFAZOLIN SODIUM-DEXTROSE 2-4 GM/100ML-% IV SOLN
2.0000 g | Freq: Four times a day (QID) | INTRAVENOUS | Status: AC
  Administered 2018-09-11 (×2): 2 g via INTRAVENOUS
  Filled 2018-09-11 (×2): qty 100

## 2018-09-11 MED ORDER — PHENYLEPHRINE 40 MCG/ML (10ML) SYRINGE FOR IV PUSH (FOR BLOOD PRESSURE SUPPORT)
PREFILLED_SYRINGE | INTRAVENOUS | Status: DC | PRN
  Administered 2018-09-11 (×2): 80 ug via INTRAVENOUS

## 2018-09-11 MED ORDER — MIDAZOLAM HCL 2 MG/2ML IJ SOLN
INTRAMUSCULAR | Status: AC
  Filled 2018-09-11: qty 2

## 2018-09-11 MED ORDER — BUPIVACAINE IN DEXTROSE 0.75-8.25 % IT SOLN
INTRATHECAL | Status: DC | PRN
  Administered 2018-09-11: 1.8 mL via INTRATHECAL

## 2018-09-11 MED ORDER — FENTANYL CITRATE (PF) 100 MCG/2ML IJ SOLN
INTRAMUSCULAR | Status: AC
  Filled 2018-09-11: qty 2

## 2018-09-11 MED ORDER — DOCUSATE SODIUM 100 MG PO CAPS
100.0000 mg | ORAL_CAPSULE | Freq: Two times a day (BID) | ORAL | Status: DC
  Administered 2018-09-11 – 2018-09-13 (×4): 100 mg via ORAL
  Filled 2018-09-11 (×4): qty 1

## 2018-09-11 MED ORDER — FENTANYL CITRATE (PF) 100 MCG/2ML IJ SOLN
INTRAMUSCULAR | Status: DC | PRN
  Administered 2018-09-11 (×2): 50 ug via INTRAVENOUS

## 2018-09-11 MED ORDER — LACTATED RINGERS IV SOLN
INTRAVENOUS | Status: DC | PRN
  Administered 2018-09-11 (×2): via INTRAVENOUS

## 2018-09-11 MED ORDER — ACETAMINOPHEN 325 MG PO TABS: 325.0000 mg | ORAL_TABLET | Freq: Four times a day (QID) | ORAL | Status: DC | PRN

## 2018-09-11 MED ORDER — SODIUM CHLORIDE 0.9 % IJ SOLN
INTRAMUSCULAR | Status: AC
  Filled 2018-09-11: qty 50

## 2018-09-11 MED ORDER — CEFAZOLIN SODIUM-DEXTROSE 2-4 GM/100ML-% IV SOLN
2.0000 g | INTRAVENOUS | Status: AC
  Administered 2018-09-11: 2 g via INTRAVENOUS
  Filled 2018-09-11: qty 100

## 2018-09-11 MED ORDER — SODIUM CHLORIDE 0.9 % IV SOLN
INTRAVENOUS | Status: DC
  Administered 2018-09-11: 75 mL/h via INTRAVENOUS
  Administered 2018-09-12: 02:00:00 via INTRAVENOUS

## 2018-09-11 MED ORDER — MENTHOL 3 MG MT LOZG: 1.0000 | LOZENGE | OROMUCOSAL | Status: DC | PRN

## 2018-09-11 MED ORDER — DIPHENHYDRAMINE HCL 12.5 MG/5ML PO ELIX: 12.5000 mg | ORAL_SOLUTION | ORAL | Status: DC | PRN

## 2018-09-11 MED ORDER — MAGNESIUM CITRATE PO SOLN: 1.0000 | Freq: Once | ORAL | Status: DC | PRN

## 2018-09-11 MED ORDER — POLYETHYLENE GLYCOL 3350 17 G PO PACK: 17.0000 g | PACK | Freq: Every day | ORAL | Status: DC | PRN

## 2018-09-11 MED ORDER — PHENOL 1.4 % MT LIQD: 1.0000 | OROMUCOSAL | Status: DC | PRN

## 2018-09-11 MED ORDER — SODIUM CHLORIDE 0.9 % IJ SOLN
INTRAMUSCULAR | Status: DC | PRN
  Administered 2018-09-11: 30 mL

## 2018-09-11 MED ORDER — DEXAMETHASONE SODIUM PHOSPHATE 10 MG/ML IJ SOLN
INTRAMUSCULAR | Status: AC
  Filled 2018-09-11: qty 1

## 2018-09-11 MED ORDER — BUPIVACAINE-EPINEPHRINE (PF) 0.25% -1:200000 IJ SOLN
INTRAMUSCULAR | Status: AC
  Filled 2018-09-11: qty 30

## 2018-09-11 MED ORDER — KETOROLAC TROMETHAMINE 30 MG/ML IJ SOLN
INTRAMUSCULAR | Status: DC | PRN
  Administered 2018-09-11: 30 mg via INTRAVENOUS

## 2018-09-11 MED ORDER — OXYCODONE HCL 5 MG PO TABS
5.0000 mg | ORAL_TABLET | ORAL | Status: DC | PRN
  Administered 2018-09-11 – 2018-09-12 (×5): 10 mg via ORAL
  Administered 2018-09-12: 5 mg via ORAL
  Filled 2018-09-11 (×5): qty 2
  Filled 2018-09-11: qty 1

## 2018-09-11 MED ORDER — MEPERIDINE HCL 50 MG/ML IJ SOLN: 6.2500 mg | INTRAMUSCULAR | Status: DC | PRN

## 2018-09-11 MED ORDER — MIDAZOLAM HCL 5 MG/5ML IJ SOLN
INTRAMUSCULAR | Status: DC | PRN
  Administered 2018-09-11: 2 mg via INTRAVENOUS

## 2018-09-11 MED ORDER — METHOCARBAMOL 500 MG PO TABS
500.0000 mg | ORAL_TABLET | Freq: Four times a day (QID) | ORAL | Status: DC | PRN
  Administered 2018-09-11 – 2018-09-13 (×6): 500 mg via ORAL
  Filled 2018-09-11 (×6): qty 1

## 2018-09-11 MED ORDER — DEXAMETHASONE SODIUM PHOSPHATE 10 MG/ML IJ SOLN
10.0000 mg | Freq: Once | INTRAMUSCULAR | Status: AC
  Administered 2018-09-11: 10 mg via INTRAVENOUS

## 2018-09-11 MED ORDER — AMLODIPINE BESYLATE 5 MG PO TABS
5.0000 mg | ORAL_TABLET | Freq: Every day | ORAL | Status: DC
  Administered 2018-09-12 – 2018-09-13 (×2): 5 mg via ORAL
  Filled 2018-09-11 (×2): qty 1

## 2018-09-11 MED ORDER — KETOROLAC TROMETHAMINE 30 MG/ML IJ SOLN
INTRAMUSCULAR | Status: AC
  Filled 2018-09-11: qty 1

## 2018-09-11 MED ORDER — PROPOFOL 10 MG/ML IV BOLUS
INTRAVENOUS | Status: AC
  Filled 2018-09-11: qty 20

## 2018-09-11 MED ORDER — PHENYLEPHRINE 40 MCG/ML (10ML) SYRINGE FOR IV PUSH (FOR BLOOD PRESSURE SUPPORT)
PREFILLED_SYRINGE | INTRAVENOUS | Status: AC
  Filled 2018-09-11: qty 10

## 2018-09-11 MED ORDER — ONDANSETRON HCL 4 MG/2ML IJ SOLN
INTRAMUSCULAR | Status: DC | PRN
  Administered 2018-09-11: 4 mg via INTRAVENOUS

## 2018-09-11 MED ORDER — ENOXAPARIN SODIUM 30 MG/0.3ML ~~LOC~~ SOLN
30.0000 mg | Freq: Two times a day (BID) | SUBCUTANEOUS | Status: DC
  Administered 2018-09-12 – 2018-09-13 (×3): 30 mg via SUBCUTANEOUS
  Filled 2018-09-11 (×3): qty 0.3

## 2018-09-11 MED ORDER — HYDROCODONE-ACETAMINOPHEN 7.5-325 MG PO TABS: 1.0000 | ORAL_TABLET | Freq: Once | ORAL | Status: DC | PRN

## 2018-09-11 MED ORDER — ONDANSETRON HCL 4 MG PO TABS: 4.0000 mg | ORAL_TABLET | Freq: Four times a day (QID) | ORAL | Status: DC | PRN

## 2018-09-11 MED ORDER — HYDROMORPHONE HCL 1 MG/ML IJ SOLN
0.5000 mg | INTRAMUSCULAR | Status: DC | PRN
  Administered 2018-09-12: 0.5 mg via INTRAVENOUS
  Filled 2018-09-11: qty 1

## 2018-09-11 MED ORDER — HYDROMORPHONE HCL 1 MG/ML IJ SOLN
0.2500 mg | INTRAMUSCULAR | Status: DC | PRN
  Administered 2018-09-11 (×4): 0.5 mg via INTRAVENOUS

## 2018-09-11 MED ORDER — ROPIVACAINE HCL 7.5 MG/ML IJ SOLN
INTRAMUSCULAR | Status: DC | PRN
  Administered 2018-09-11: 20 mL via PERINEURAL

## 2018-09-11 MED ORDER — ONDANSETRON HCL 4 MG/2ML IJ SOLN: 4.0000 mg | Freq: Once | INTRAMUSCULAR | Status: DC | PRN

## 2018-09-11 MED ORDER — HYDROMORPHONE HCL 1 MG/ML IJ SOLN
INTRAMUSCULAR | Status: AC
  Filled 2018-09-11: qty 2

## 2018-09-11 MED ORDER — METOCLOPRAMIDE HCL 5 MG PO TABS: 5.0000 mg | ORAL_TABLET | Freq: Three times a day (TID) | ORAL | Status: DC | PRN

## 2018-09-11 MED ORDER — FERROUS SULFATE 325 (65 FE) MG PO TABS
325.0000 mg | ORAL_TABLET | Freq: Three times a day (TID) | ORAL | Status: DC
  Administered 2018-09-11 – 2018-09-13 (×6): 325 mg via ORAL
  Filled 2018-09-11 (×6): qty 1

## 2018-09-11 MED ORDER — METOCLOPRAMIDE HCL 5 MG/ML IJ SOLN: 5.0000 mg | Freq: Three times a day (TID) | INTRAMUSCULAR | Status: DC | PRN

## 2018-09-11 MED ORDER — TRANEXAMIC ACID 1000 MG/10ML IV SOLN
1000.0000 mg | INTRAVENOUS | Status: AC
  Administered 2018-09-11: 1000 mg via INTRAVENOUS

## 2018-09-11 SURGICAL SUPPLY — 60 items
BAG DECANTER FOR FLEXI CONT (MISCELLANEOUS) IMPLANT
BAG ZIPLOCK 12X15 (MISCELLANEOUS) ×4 IMPLANT
BANDAGE ACE 4X5 VEL STRL LF (GAUZE/BANDAGES/DRESSINGS) ×2 IMPLANT
BANDAGE ACE 6X5 VEL STRL LF (GAUZE/BANDAGES/DRESSINGS) ×2 IMPLANT
BLADE SAG 18X100X1.27 (BLADE) ×2 IMPLANT
BLADE SAW SGTL 13.0X1.19X90.0M (BLADE) ×2 IMPLANT
BOWL SMART MIX CTS (DISPOSABLE) ×2 IMPLANT
CEMENT HV SMART SET (Cement) ×4 IMPLANT
CEMENT TIBIA MBT (Knees) ×1 IMPLANT
COVER SURGICAL LIGHT HANDLE (MISCELLANEOUS) ×2 IMPLANT
CUFF TOURN SGL QUICK 34 (TOURNIQUET CUFF) ×1
CUFF TRNQT CYL 34X4X40X1 (TOURNIQUET CUFF) ×1 IMPLANT
DECANTER SPIKE VIAL GLASS SM (MISCELLANEOUS) ×4 IMPLANT
DERMABOND ADVANCED (GAUZE/BANDAGES/DRESSINGS) ×1
DERMABOND ADVANCED .7 DNX12 (GAUZE/BANDAGES/DRESSINGS) ×1 IMPLANT
DRAPE U-SHAPE 47X51 STRL (DRAPES) ×2 IMPLANT
DRSG AQUACEL AG ADV 3.5X10 (GAUZE/BANDAGES/DRESSINGS) ×2 IMPLANT
DRSG TEGADERM 4X4.75 (GAUZE/BANDAGES/DRESSINGS) ×2 IMPLANT
DURAPREP 26ML APPLICATOR (WOUND CARE) ×4 IMPLANT
ELECT REM PT RETURN 15FT ADLT (MISCELLANEOUS) ×2 IMPLANT
EVACUATOR 1/8 PVC DRAIN (DRAIN) ×2 IMPLANT
GAUZE SPONGE 2X2 8PLY STRL LF (GAUZE/BANDAGES/DRESSINGS) ×1 IMPLANT
GLOVE BIO SURGEON STRL SZ7.5 (GLOVE) ×4 IMPLANT
GLOVE BIOGEL PI IND STRL 8 (GLOVE) ×6 IMPLANT
GLOVE BIOGEL PI INDICATOR 8 (GLOVE) ×6
GLOVE ECLIPSE 8.0 STRL XLNG CF (GLOVE) ×4 IMPLANT
GLOVE SURG ORTHO 9.0 STRL STRW (GLOVE) ×2 IMPLANT
GOWN STRL REUS W/TWL XL LVL3 (GOWN DISPOSABLE) ×8 IMPLANT
HANDPIECE INTERPULSE COAX TIP (DISPOSABLE) ×1
HOLDER FOLEY CATH W/STRAP (MISCELLANEOUS) IMPLANT
IMMOBILIZER KNEE 20 (SOFTGOODS) ×2
IMMOBILIZER KNEE 20 THIGH 36 (SOFTGOODS) ×1 IMPLANT
IMPL FEMUR SIGMA LT PS SZ 3 (Knees) ×1 IMPLANT
IMPLANT FEMUR SIGMA LT PS SZ 3 (Knees) ×2 IMPLANT
INSERT PFC SIG STB SZ3 15.0MM (Knees) ×2 IMPLANT
NS IRRIG 1000ML POUR BTL (IV SOLUTION) ×2 IMPLANT
PACK TOTAL KNEE CUSTOM (KITS) ×2 IMPLANT
PATELLA DOME PFC 35MM (Knees) ×2 IMPLANT
PIN STEINMAN FIXATION KNEE (PIN) ×2 IMPLANT
POSITIONER SURGICAL ARM (MISCELLANEOUS) ×2 IMPLANT
SET HNDPC FAN SPRY TIP SCT (DISPOSABLE) ×1 IMPLANT
SET PAD KNEE POSITIONER (MISCELLANEOUS) ×2 IMPLANT
SPONGE GAUZE 2X2 STER 10/PKG (GAUZE/BANDAGES/DRESSINGS) ×1
SPONGE LAP 18X18 RF (DISPOSABLE) IMPLANT
SPONGE SURGIFOAM ABS GEL 100 (HEMOSTASIS) ×2 IMPLANT
STOCKINETTE 6  STRL (DRAPES) ×1
STOCKINETTE 6 STRL (DRAPES) ×1 IMPLANT
SUT BONE WAX W31G (SUTURE) IMPLANT
SUT MNCRL AB 3-0 PS2 18 (SUTURE) ×2 IMPLANT
SUT VIC AB 1 CT1 27 (SUTURE) ×4
SUT VIC AB 1 CT1 27XBRD ANTBC (SUTURE) ×4 IMPLANT
SUT VIC AB 2-0 CT1 27 (SUTURE) ×2
SUT VIC AB 2-0 CT1 TAPERPNT 27 (SUTURE) ×2 IMPLANT
SUT VLOC 180 0 24IN GS25 (SUTURE) ×2 IMPLANT
SYR 50ML LL SCALE MARK (SYRINGE) ×2 IMPLANT
TAPE STRIPS DRAPE STRL (GAUZE/BANDAGES/DRESSINGS) ×2 IMPLANT
TIBIA MBT CEMENT (Knees) ×2 IMPLANT
TRAY FOLEY CATH 14FR (SET/KITS/TRAYS/PACK) ×2 IMPLANT
WRAP KNEE MAXI GEL POST OP (GAUZE/BANDAGES/DRESSINGS) ×2 IMPLANT
YANKAUER SUCT BULB TIP 10FT TU (MISCELLANEOUS) ×2 IMPLANT

## 2018-09-11 NOTE — Op Note (Signed)
DATE OF SURGERY:  09/11/2018  TIME: 9:53 AM  PATIENT NAME:  Autumn Price    AGE: 77 y.o.   PRE-OPERATIVE DIAGNOSIS:  Left knee osteoarthritis  POST-OPERATIVE DIAGNOSIS:  Left knee osteoarthritis  PROCEDURE:  Procedure(s): LEFT TOTAL KNEE ARTHROPLASTY  SURGEON:  Neila Teem ANDREW  ASSISTANT:  Bryson Stilwell, PA-C, present and scrubbed throughout the case, critical for assistance with exposure, retraction, instrumentation, and closure.  OPERATIVE IMPLANTS: Depuy PFC Sigma Rotating Platform.  Femur size 3, Tibia size 3, Patella size 35 3-peg oval button, with a 15 mm polyethylene insert.   PREOPERATIVE INDICATIONS:   Autumn Price is a 77 y.o. year old female with end stage bone on bone arthritis of the knee who failed conservative treatment and elected for Total Knee Arthroplasty.   The risks, benefits, and alternatives were discussed at length including but not limited to the risks of infection, bleeding, nerve injury, stiffness, blood clots, the need for revision surgery, cardiopulmonary complications, among others, and they were willing to proceed.  OPERATIVE DESCRIPTION:  The patient was brought to the operative room and placed in a supine position.  Spinal anesthesia was administered.  IV antibiotics were given.  The lower extremity was prepped and draped in the usual sterile fashion.  Time out was performed.  The leg was elevated and exsanguinated and the tourniquet was inflated.  Anterior quadriceps tendon splitting approach was performed.  The patella was retracted and osteophytes were removed.  The anterior horn of the medial and lateral meniscus was removed and cruciate ligaments resected.   The distal femur was opened with the drill and the intramedullary distal femoral cutting jig was utilized, set at 5 degrees resecting 10 mm off the distal femur.  Care was taken to protect the collateral ligaments.  The distal femoral sizing jig was applied, taking care to  avoid notching.  Then the 4-in-1 cutting jig was applied and the anterior and posterior femur was cut, along with the chamfer cuts.    Then the extramedullary tibial cutting jig was utilized making the appropriate cut using the anterior tibial crest as a reference building in appropriate posterior slope.  Care was taken during the cut to protect the medial and collateral ligaments.  The proximal tibia was removed along with the posterior horns of the menisci.   The posterior medial femoral osteophytes and posterior lateral femoral osteophytes were removed.    The flexion gap was then measured and was symmetric with the extension gap, measured at 15.  I completed the distal femoral preparation using the appropriate jig to prepare the box.  The patella was then measured, and cut with the saw.    The proximal tibia sized and prepared accordingly with the reamer and the punch, and then all components were trialed with the trial insert.  The knee was found to have excellent balance and full motion.    The above named components were then cemented into place and all excess cement was removed.  The trial polyethylene component was in place during cementation, and then was exchanged for the real polyethylene component.    The knee was easily taken through a range of motion and the patella tracked well and the knee irrigated copiously and the parapatellar and subcutaneous tissue closed with vicryl, and monocryl with steri strips for the skin.  The arthrotomy was closed at 90 of flexion. The wounds were dressed with sterile gauze and the tourniquet released and the patient was awakened and returned to the PACU  in stable and satisfactory condition.  There were no complications.  Total tourniquet time was 70  minutes.

## 2018-09-11 NOTE — Plan of Care (Signed)
  Problem: Clinical Measurements: Goal: Ability to maintain clinical measurements within normal limits will improve Outcome: Progressing Goal: Will remain free from infection Outcome: Progressing Goal: Cardiovascular complication will be avoided Outcome: Progressing   Problem: Activity: Goal: Risk for activity intolerance will decrease Outcome: Progressing   Problem: Nutrition: Goal: Adequate nutrition will be maintained Outcome: Progressing   Problem: Coping: Goal: Level of anxiety will decrease Outcome: Progressing   Problem: Elimination: Goal: Will not experience complications related to bowel motility Outcome: Progressing Goal: Will not experience complications related to urinary retention Outcome: Progressing   Problem: Safety: Goal: Ability to remain free from injury will improve Outcome: Progressing   Problem: Skin Integrity: Goal: Risk for impaired skin integrity will decrease Outcome: Progressing   Problem: Education: Goal: Knowledge of the prescribed therapeutic regimen will improve Outcome: Progressing Goal: Individualized Educational Video(s) Outcome: Progressing   Problem: Activity: Goal: Ability to avoid complications of mobility impairment will improve Outcome: Progressing Goal: Range of joint motion will improve Outcome: Progressing   Problem: Clinical Measurements: Goal: Postoperative complications will be avoided or minimized Outcome: Progressing   Problem: Pain Management: Goal: Pain level will decrease with appropriate interventions Outcome: Progressing   Problem: Skin Integrity: Goal: Will show signs of wound healing Outcome: Progressing

## 2018-09-11 NOTE — Anesthesia Procedure Notes (Signed)
Anesthesia Regional Block: Adductor canal block   Pre-Anesthetic Checklist: ,, timeout performed, Correct Patient, Correct Site, Correct Laterality, Correct Procedure, Correct Position, site marked, Risks and benefits discussed,  Surgical consent,  Pre-op evaluation,  At surgeon's request and post-op pain management  Laterality: Left  Prep: chloraprep       Needles:  Injection technique: Single-shot  Needle Type: Echogenic Stimulator Needle     Needle Length: 10cm  Needle Gauge: 21   Needle insertion depth: 7 cm   Additional Needles:   Procedures:,,,, ultrasound used (permanent image in chart),,,,  Narrative:  Start time: 09/11/2018 6:57 AM End time: 09/11/2018 7:02 AM Injection made incrementally with aspirations every 5 mL.  Performed by: Personally  Anesthesiologist: Mal Amabile, MD  Additional Notes: Timeout performed. Patient sedated. Relevant anatomy ID'd using [place]. Incremental 2-61ml injection of LA with frequent aspiration. Patient tolerated procedure well.

## 2018-09-11 NOTE — Interval H&P Note (Signed)
History and Physical Interval Note:  09/11/2018 7:35 AM  Autumn Price  has presented today for surgery, with the diagnosis of Left knee osteoarthritis  The various methods of treatment have been discussed with the patient and family. After consideration of risks, benefits and other options for treatment, the patient has consented to  Procedure(s): LEFT TOTAL KNEE ARTHROPLASTY (Left) as a surgical intervention .  The patient's history has been reviewed, patient examined, no change in status, stable for surgery.  I have reviewed the patient's chart and labs.  Questions were answered to the patient's satisfaction.     Corderro Koloski ANDREW

## 2018-09-11 NOTE — Transfer of Care (Signed)
Immediate Anesthesia Transfer of Care Note  Patient: JHENE WESTMORELAND  Procedure(s) Performed: LEFT TOTAL KNEE ARTHROPLASTY (Left Knee)  Patient Location: PACU  Anesthesia Type:Regional and Spinal  Level of Consciousness: sedated  Airway & Oxygen Therapy: Patient Spontanous Breathing and Patient connected to face mask oxygen  Post-op Assessment: Report given to RN and Post -op Vital signs reviewed and stable  Post vital signs: Reviewed and stable  Last Vitals:  Vitals Value Taken Time  BP    Temp    Pulse 78 09/11/2018  9:57 AM  Resp 16 09/11/2018  9:57 AM  SpO2 100 % 09/11/2018  9:57 AM  Vitals shown include unvalidated device data.  Last Pain:  Vitals:   09/11/18 0607  TempSrc: Oral  PainSc: 8       Patients Stated Pain Goal: 4 (09/11/18 4782)  Complications: No apparent anesthesia complications

## 2018-09-11 NOTE — Evaluation (Signed)
Physical Therapy Evaluation Patient Details Name: Autumn Price MRN: [id number] DOB: 1941-02-16 Today's Date: 09/11/2018   History of Present Illness  Pt is a 77 YO female s/p L TKR on 09/11/18. PMH includes R TKR 03/2018, OA, fatigue, mood disorder, HTN, venous insufficiency.   Clinical Impression   Pt s/p L TKR. Pt presents with L knee pain, difficulty and increased time to perform bed mobility, decreased tolerance for ambulation. Pt to benefit from acute PT to address deficits. Pt ambulated hallway distance with PT today, requiring min guard assist. Pt tolerated mobility well with no increased pain. PT to continue to follow acutely, and will progress mobility as able.     Follow Up Recommendations Follow surgeon's recommendation for DC plan and follow-up therapies;Supervision for mobility/OOB(HHPT )    Equipment Recommendations  None recommended by PT    Recommendations for Other Services       Precautions / Restrictions Precautions Precautions: Fall Required Braces or Orthoses: Knee Immobilizer - Left Knee Immobilizer - Left: On when out of bed or walking;Discontinue once straight leg raise with < 10 degree lag Restrictions Weight Bearing Restrictions: No Other Position/Activity Restrictions: WBAT       Mobility  Bed Mobility Overal bed mobility: Needs Assistance Bed Mobility: Supine to Sit     Supine to sit: Min guard     General bed mobility comments: Min guard for bed mobility, very increased time and effort but pt did not want physical assist.   Transfers Overall transfer level: Needs assistance Equipment used: Rolling walker (2 wheeled) Transfers: Sit to/from Stand Sit to Stand: Mod assist;From elevated surface         General transfer comment: Assist for trunk elevation, and hip extension for standing. Difficulty with hip extension, increased time and effort with 2 attempts to stand, one unsuccessful and one successful.   Ambulation/Gait Ambulation/Gait  assistance: Min guard Gait Distance (Feet): 60 Feet Assistive device: Rolling walker (2 wheeled) Gait Pattern/deviations: Step-to pattern;Decreased stride length;Decreased weight shift to left;Antalgic Gait velocity: decr    General Gait Details: Min guard for safety. Verbal cuing for sequencing, placement in RW.   Stairs            Wheelchair Mobility    Modified Rankin (Stroke Patients Only)       Balance Overall balance assessment: Mild deficits observed, not formally tested                                           Pertinent Vitals/Pain Pain Assessment: 0-10 Pain Score: 5  Pain Location: L knee  Pain Descriptors / Indicators: Aching Pain Intervention(s): Limited activity within patient's tolerance;Repositioned;Ice applied;Monitored during session    Home Living Family/patient expects to be discharged to:: Private residence Living Arrangements: Alone Available Help at Discharge: Family;Available PRN/intermittently Type of Home: Mobile home Home Access: Stairs to enter Entrance Stairs-Rails: Can reach both;Left;Right Entrance Stairs-Number of Steps: 4 Home Layout: One level Home Equipment: Walker - 2 wheels;Bedside commode      Prior Function Level of Independence: Independent with assistive device(s)         Comments: used RW for ambulation prior to surgery.      Hand Dominance   Dominant Hand: Right    Extremity/Trunk Assessment   Upper Extremity Assessment Upper Extremity Assessment: Overall WFL for tasks assessed    Lower Extremity Assessment Lower Extremity Assessment: Generalized weakness;LLE  deficits/detail LLE Deficits / Details: Suspected post-surgical weakness    Cervical / Trunk Assessment Cervical / Trunk Assessment: Normal  Communication   Communication: No difficulties  Cognition Arousal/Alertness: Awake/alert Behavior During Therapy: WFL for tasks assessed/performed Overall Cognitive Status: Within  Functional Limits for tasks assessed                                        General Comments      Exercises Total Joint Exercises Ankle Circles/Pumps: AROM;Both;10 reps;Supine   Assessment/Plan    PT Assessment Patient needs continued PT services  PT Problem List Decreased strength;Pain;Decreased range of motion;Decreased activity tolerance;Decreased balance;Decreased mobility;Decreased safety awareness;Decreased knowledge of use of DME       PT Treatment Interventions DME instruction;Therapeutic activities;Gait training;Therapeutic exercise;Patient/family education;Stair training;Balance training;Functional mobility training    PT Goals (Current goals can be found in the Care Plan section)  Acute Rehab PT Goals PT Goal Formulation: With patient Time For Goal Achievement: 09/25/18 Potential to Achieve Goals: Good    Frequency 7X/week   Barriers to discharge        Co-evaluation               AM-PAC PT "6 Clicks" Daily Activity  Outcome Measure Difficulty turning over in bed (including adjusting bedclothes, sheets and blankets)?: Unable Difficulty moving from lying on back to sitting on the side of the bed? : Unable Difficulty sitting down on and standing up from a chair with arms (e.g., wheelchair, bedside commode, etc,.)?: Unable Help needed moving to and from a bed to chair (including a wheelchair)?: None Help needed walking in hospital room?: A Little Help needed climbing 3-5 steps with a railing? : A Little 6 Click Score: 13    End of Session Equipment Utilized During Treatment: Gait belt Activity Tolerance: Patient tolerated treatment well Patient left: in chair;with chair alarm set;with call bell/phone within reach;with family/visitor present;with SCD's reapplied Nurse Communication: Mobility status PT Visit Diagnosis: Other abnormalities of gait and mobility (R26.89);Difficulty in walking, not elsewhere classified (R26.2)    Time:  4696-2952 PT Time Calculation (min) (ACUTE ONLY): 38 min   Charges:   PT Evaluation $PT Eval Low Complexity: 1 Low PT Treatments $Gait Training: 8-22 mins       Nicola Police, PT Acute Rehabilitation Services Pager [phone]  Office [phone]   Mayte Diers D Despina Hidden 09/11/2018, 5:33 PM

## 2018-09-11 NOTE — Anesthesia Postprocedure Evaluation (Signed)
Anesthesia Post Note  Patient: Autumn Price  Procedure(s) Performed: LEFT TOTAL KNEE ARTHROPLASTY (Left Knee)     Patient location during evaluation: PACU Anesthesia Type: Spinal Level of consciousness: oriented and awake and alert Pain management: pain level controlled Vital Signs Assessment: post-procedure vital signs reviewed and stable Respiratory status: spontaneous breathing, respiratory function stable, patient connected to nasal cannula oxygen and nonlabored ventilation Cardiovascular status: blood pressure returned to baseline and stable Postop Assessment: no headache, no backache, no apparent nausea or vomiting, patient able to bend at knees and spinal receding Anesthetic complications: no    Last Vitals:  Vitals:   09/11/18 1045 09/11/18 1108  BP: (!) 159/82 (!) 165/80  Pulse: 81 83  Resp: 15 14  Temp: (!) 36.4 C 36.5 C  SpO2: 96% 92%    Last Pain:  Vitals:   09/11/18 1108  TempSrc: Oral  PainSc:                  [name] A.

## 2018-09-11 NOTE — Progress Notes (Signed)
OR nurse notified that pts TED hose are knee ted hose due to pt having thighs too large for thigh high ted hose.

## 2018-09-11 NOTE — Care Management Note (Signed)
Case Management Note  Patient Details  Name: Autumn Price MRN: [id number] Date of Birth: Mar 04, 1941  Subjective/Objective:   Discharge planning, spoke with patient at bedside. Have chosen [facility] PT, evaluate and treat.          Action/Plan: Contacted [facility] for referral. Has DME. [phone]          Expected Discharge Date:  09/11/18               Expected Discharge Plan:  Home w Home Health Services  In-House Referral:  NA  Discharge planning Services  CM Consult  Post Acute Care Choice:  Home Health Choice offered to:  Patient  DME Arranged:  N/A DME Agency:  NA  HH Arranged:  PT HH Agency:  [facility] ([facility]  Status of Service:  Completed, signed off  If discussed at [employer], dates discussed:    Additional Comments:  Alexis Goodell, RN 09/11/2018, 3:27 PM

## 2018-09-12 LAB — CBC
HCT: 36.2 % (ref 36.0–46.0)
Hemoglobin: 11.3 g/dL — ABNORMAL LOW (ref 12.0–15.0)
MCH: 28 pg (ref 26.0–34.0)
MCHC: 31.2 g/dL (ref 30.0–36.0)
MCV: 89.8 fL (ref 78.0–100.0)
Platelets: 125 10*3/uL — ABNORMAL LOW (ref 150–400)
RBC: 4.03 MIL/uL (ref 3.87–5.11)
RDW: 14 % (ref 11.5–15.5)
WBC: 7.4 10*3/uL (ref 4.0–10.5)

## 2018-09-12 LAB — BASIC METABOLIC PANEL
Anion gap: 7 (ref 5–15)
BUN: 23 mg/dL (ref 8–23)
CO2: 27 mmol/L (ref 22–32)
Calcium: 9.1 mg/dL (ref 8.9–10.3)
Chloride: 106 mmol/L (ref 98–111)
Creatinine, Ser: 0.78 mg/dL (ref 0.44–1.00)
GFR calc Af Amer: >60 mL/min (ref >60)
GFR calc non Af Amer: >60 mL/min (ref >60)
Glucose, Bld: 159 mg/dL — ABNORMAL HIGH (ref 70–99)
Potassium: 4.9 mmol/L (ref 3.5–5.1)
Sodium: 140 mmol/L (ref 135–145)

## 2018-09-12 NOTE — Progress Notes (Signed)
PT Cancellation Note  Patient Details Name: Autumn Price MRN: [id number] DOB: [birth year]   Cancelled Treatment:    Reason Eval/Treat Not Completed: Pain limiting ability to participate   Rada Hay 09/12/2018, 5:55 PM Blanchard Kelch PT Acute Rehabilitation Services Pager [phone] Office [phone]

## 2018-09-12 NOTE — Progress Notes (Signed)
    Subjective:  Patient reports pain as mild to moderate.  Denies N/V/CP/SOB. No c/o.  Objective:   VITALS:   Vitals:   09/11/18 2122 09/12/18 0144 09/12/18 0501 09/12/18 0937  BP: (!) 145/74 (!) 135/58 (!) 121/55 (!) 133/58  Pulse: 82 76 70 67  Resp: 18 16 18 13   Temp: 98.3 F (36.8 C) 98 F (36.7 C) 97.7 F (36.5 C) 97.7 F (36.5 C)  TempSrc: Oral Oral Oral Oral  SpO2: 93% 96% 98% 95%  Weight:      Height:        NAD ABD soft Sensation intact distally Intact pulses distally Dorsiflexion/Plantar flexion intact Incision: dressing C/D/I Compartment soft HV ss  Lab Results  Component Value Date   WBC 7.4 09/12/2018   HGB 11.3 (L) 09/12/2018   HCT 36.2 09/12/2018   MCV 89.8 09/12/2018   PLT 125 (L) 09/12/2018   BMET    Component Value Date/Time   NA 140 09/12/2018 0400   NA 142 01/20/2014 1303   K 4.9 09/12/2018 0400   K 4.4 01/20/2014 1303   CL 106 09/12/2018 0400   CL 106 01/20/2014 1303   CO2 27 09/12/2018 0400   CO2 33 (H) 01/20/2014 1303   GLUCOSE 159 (H) 09/12/2018 0400   GLUCOSE 129 (H) 01/20/2014 1303   BUN 23 09/12/2018 0400   BUN 13 01/20/2014 1303   CREATININE 0.78 09/12/2018 0400   CREATININE 0.72 01/20/2014 1303   CALCIUM 9.1 09/12/2018 0400   CALCIUM 9.6 01/20/2014 1303   GFRNONAA >60 09/12/2018 0400   GFRNONAA >60 01/20/2014 1303   GFRAA >60 09/12/2018 0400   GFRAA >60 01/20/2014 1303     Assessment/Plan: 1 Day Post-Op   Active Problems:   Osteoarthritis of left knee   S/P knee replacement   WBAT with walker DVT ppx: Lovenox, SCDs, TEDS PO pain control PT/OT Dispo: D/C HV drain, D/c home tomorrow with HHPT    Iline Oven Davon Abdelaziz 09/12/2018, 10:07 AM   Samson Frederic, MD Cell [phone]

## 2018-09-12 NOTE — Progress Notes (Signed)
Physical Therapy Treatment Patient Details Name: Autumn Price MRN: [id number] DOB: 10-Feb-1941 Today's Date: 09/12/2018    History of Present Illness Pt is a 77 YO female s/p L TKR on 09/11/18. PMH includes R TKR 03/2018, OA, fatigue, mood disorder, HTN, venous insufficiency.     PT Comments    Patient is progressing well. Plans Dc to home with HHPT, ? Tomorrow. Continue PT.  Follow Up Recommendations  Home health PT     Equipment Recommendations  None recommended by PT    Recommendations for Other Services       Precautions / Restrictions Precautions Precautions: Fall Required Braces or Orthoses: Knee Immobilizer - Left Knee Immobilizer - Left: On when out of bed or walking;Discontinue once straight leg raise with < 10 degree lag Restrictions Other Position/Activity Restrictions: WBAT     Mobility  Bed Mobility Overal bed mobility: Needs Assistance Bed Mobility: Supine to Sit     Supine to sit: Min guard     General bed mobility comments: Min guard for bed mobility,  extra time to move the  Hips to bed edge. Able to slide  leg to bed edge.  Transfers Overall transfer level: Needs assistance Equipment used: Rolling walker (2 wheeled) Transfers: Sit to/from Stand Sit to Stand: Min assist         General transfer comment: assist from bed and BSC in bathroom, patient is able to self assist and balance  Ambulation/Gait Ambulation/Gait assistance: Min assist;Min guard Gait Distance (Feet): 60 Feet Assistive device: Rolling walker (2 wheeled) Gait Pattern/deviations: Step-to pattern;Decreased stride length;Decreased weight shift to left;Antalgic Gait velocity: decr    General Gait Details: Min guard for safety. Verbal cuing for sequencing, placement in RW.    Stairs             Wheelchair Mobility    Modified Rankin (Stroke Patients Only)       Balance                                            Cognition Arousal/Alertness:  Awake/alert                                            Exercises Total Joint Exercises Ankle Circles/Pumps: AROM;Both;10 reps;Supine Quad Sets: AROM;5 reps;Left;Supine Short Arc Quad: AROM;Left;5 reps;Supine Heel Slides: AAROM;Left;5 reps;Supine Hip ABduction/ADduction: AROM;Left;10 reps;Supine    General Comments        Pertinent Vitals/Pain Pain Score: 5  Pain Location: L knee  Pain Descriptors / Indicators: Aching;Discomfort Pain Intervention(s): Premedicated before session;Monitored during session;Limited activity within patient's tolerance    Home Living                      Prior Function            PT Goals (current goals can now be found in the care plan section) Progress towards PT goals: Progressing toward goals    Frequency    7X/week      PT Plan Current plan remains appropriate    Co-evaluation              AM-PAC PT "6 Clicks" Daily Activity  Outcome Measure  Difficulty turning over in bed (including adjusting bedclothes, sheets and blankets)?: A Little  Difficulty moving from lying on back to sitting on the side of the bed? : A Little Difficulty sitting down on and standing up from a chair with arms (e.g., wheelchair, bedside commode, etc,.)?: A Lot Help needed moving to and from a bed to chair (including a wheelchair)?: A Lot Help needed walking in hospital room?: A Lot Help needed climbing 3-5 steps with a railing? : Total 6 Click Score: 13    End of Session Equipment Utilized During Treatment: Gait belt;Left knee immobilizer Activity Tolerance: Patient tolerated treatment well Patient left: in chair;with call bell/phone within reach;with chair alarm set Nurse Communication: Mobility status PT Visit Diagnosis: Other abnormalities of gait and mobility (R26.89);Difficulty in walking, not elsewhere classified (R26.2);Pain Pain - Right/Left: Left Pain - part of body: Knee     Time: 1610-9604 PT Time  Calculation (min) (ACUTE ONLY): 46 min  Charges:  $Gait Training: 8-22 mins $Therapeutic Exercise: 8-22 mins $Self Care/Home Management: 8-22                     Blanchard Kelch PT Acute Rehabilitation Services Pager [phone] Office [phone]    Rada Hay 09/12/2018, 1:24 PM

## 2018-09-13 LAB — CBC
HCT: 34.9 % — ABNORMAL LOW (ref 36.0–46.0)
Hemoglobin: 11 g/dL — ABNORMAL LOW (ref 12.0–15.0)
MCH: 28 pg (ref 26.0–34.0)
MCHC: 31.5 g/dL (ref 30.0–36.0)
MCV: 88.8 fL (ref 78.0–100.0)
Platelets: 115 10*3/uL — ABNORMAL LOW (ref 150–400)
RBC: 3.93 MIL/uL (ref 3.87–5.11)
RDW: 14.2 % (ref 11.5–15.5)
WBC: 7.3 10*3/uL (ref 4.0–10.5)

## 2018-09-13 NOTE — Progress Notes (Signed)
Physical Therapy Treatment Patient Details Name: Autumn Price MRN: [id number] DOB: 06-Oct-1941 Today's Date: 09/13/2018    History of Present Illness Pt is a 77 YO female s/p L TKR on 09/11/18. PMH includes R TKR 03/2018, OA, fatigue, mood disorder, HTN, venous insufficiency.     PT Comments    Initiated stair training and pt did well.  She had good recall from last TKA.  She was able to increase her ambulation distance as well.   Follow Up Recommendations  Home health PT     Equipment Recommendations  None recommended by PT    Recommendations for Other Services       Precautions / Restrictions Precautions Precautions: Fall Required Braces or Orthoses: Knee Immobilizer - Left Knee Immobilizer - Left: On when out of bed or walking;Discontinue once straight leg raise with < 10 degree lag Restrictions Weight Bearing Restrictions: No    Mobility  Bed Mobility Overal bed mobility: Needs Assistance Bed Mobility: Supine to Sit     Supine to sit: Min guard     General bed mobility comments: min guard with increased time  Transfers Overall transfer level: Needs assistance Equipment used: Rolling walker (2 wheeled) Transfers: Sit to/from Stand Sit to Stand: Min assist         General transfer comment: cues for hand placement and MIN A to power up  Ambulation/Gait Ambulation/Gait assistance: Min guard Gait Distance (Feet): 90 Feet Assistive device: Rolling walker (2 wheeled) Gait Pattern/deviations: Decreased stance time - left;Decreased step length - right Gait velocity: decr    General Gait Details: min /guard for safety   Stairs Stairs: Yes Stairs assistance: Min guard;Min assist Stair Management: Two rails;Step to pattern;Forwards Number of Stairs: 5 General stair comments: Pt with good recall of sequencing from other TKA   Wheelchair Mobility    Modified Rankin (Stroke Patients Only)       Balance                                             Cognition Arousal/Alertness: Awake/alert Behavior During Therapy: WFL for tasks assessed/performed Overall Cognitive Status: Within Functional Limits for tasks assessed                                        Exercises      General Comments        Pertinent Vitals/Pain Pain Assessment: Faces Faces Pain Scale: Hurts even more Pain Location: L knee  Pain Descriptors / Indicators: Aching;Discomfort Pain Intervention(s): Limited activity within patient's tolerance;Monitored during session;Repositioned;Ice applied    Home Living                      Prior Function            PT Goals (current goals can now be found in the care plan section) Progress towards PT goals: Progressing toward goals    Frequency    7X/week      PT Plan Current plan remains appropriate    Co-evaluation              AM-PAC PT "6 Clicks" Daily Activity  Outcome Measure  Difficulty turning over in bed (including adjusting bedclothes, sheets and blankets)?: A Little Difficulty moving from lying on back to  sitting on the side of the bed? : A Little Difficulty sitting down on and standing up from a chair with arms (e.g., wheelchair, bedside commode, etc,.)?: A Little Help needed moving to and from a bed to chair (including a wheelchair)?: A Little Help needed walking in hospital room?: A Little Help needed climbing 3-5 steps with a railing? : A Little 6 Click Score: 18    End of Session Equipment Utilized During Treatment: Gait belt;Left knee immobilizer Activity Tolerance: Patient tolerated treatment well Patient left: in chair;with call bell/phone within reach;with chair alarm set Nurse Communication: Mobility status PT Visit Diagnosis: Other abnormalities of gait and mobility (R26.89);Difficulty in walking, not elsewhere classified (R26.2);Pain Pain - Right/Left: Left Pain - part of body: Knee     Time: 1610-9604 PT Time Calculation (min)  (ACUTE ONLY): 23 min  Charges:  $Gait Training: 8-22 mins $Therapeutic Activity: 8-22 mins                     Autumn Price, [place] Pager [phone] 09/13/2018    Autumn Price 09/13/2018, 1:15 PM

## 2018-09-13 NOTE — Progress Notes (Signed)
    Subjective: 2 Days Post-Op Procedure(s) (LRB): LEFT TOTAL KNEE ARTHROPLASTY (Left) Patient reports pain as 5 on 0-10 scale.   Denies CP or SOB.  Voiding without difficulty. Positive flatus. Objective: Vital signs in last 24 hours: Temp:  [97.7 F (36.5 C)-98.5 F (36.9 C)] 97.7 F (36.5 C) (09/29 0450) Pulse Rate:  [67-85] 73 (09/29 0450) Resp:  [13-19] 18 (09/29 0450) BP: (133-161)/(58-76) 156/69 (09/29 0450) SpO2:  [86 %-95 %] 91 % (09/29 0450)  Intake/Output from previous day: 09/28 0701 - 09/29 0700 In: 760 [P.O.:760] Out: 855 [Urine:825; Drains:30] Intake/Output this shift: No intake/output data recorded.  Labs: Recent Labs    09/12/18 0400 09/13/18 0416  HGB 11.3* 11.0*   Recent Labs    09/12/18 0400 09/13/18 0416  WBC 7.4 7.3  RBC 4.03 3.93  HCT 36.2 34.9*  PLT 125* 115*   Recent Labs    09/12/18 0400  NA 140  K 4.9  CL 106  CO2 27  BUN 23  CREATININE 0.78  GLUCOSE 159*  CALCIUM 9.1   No results for input(s): LABPT, INR in the last 72 hours.  Physical Exam: Neurologically intact ABD soft Sensation intact distally Dorsiflexion/Plantar flexion intact Incision: wrapped in ace bandage Compartment soft Body mass index is 41.45 kg/m.   Assessment/Plan: 2 Days Post-Op Procedure(s) (LRB): LEFT TOTAL KNEE ARTHROPLASTY (Left) Advance diet Up with therapy  Hopeful to DC today with Home Health Pt already has post op meds  Kariah Loredo, Baxter Kail for Dr. Venita Lick [facility] [phone] 09/13/2018, 7:54 AM

## 2018-09-13 NOTE — Progress Notes (Signed)
Physical Therapy Treatment Patient Details Name: Autumn Price MRN: [id number] DOB: 06-13-1941 Today's Date: 09/13/2018    History of Present Illness Pt is a 77 YO female s/p L TKR on 09/11/18. PMH includes R TKR 03/2018, OA, fatigue, mood disorder, HTN, venous insufficiency.     PT Comments    Pt was sleepy this afternoon, but alert.  She was able to increase her ambulation distance.  She declined stair training this afternoon, but did this AM.    Follow Up Recommendations  Home health PT     Equipment Recommendations  None recommended by PT    Recommendations for Other Services       Precautions / Restrictions Precautions Precautions: Fall Required Braces or Orthoses: Knee Immobilizer - Left Knee Immobilizer - Left: On when out of bed or walking;Discontinue once straight leg raise with < 10 degree lag Restrictions Weight Bearing Restrictions: No Other Position/Activity Restrictions: WBAT     Mobility  Bed Mobility Overal bed mobility: Needs Assistance Bed Mobility: Supine to Sit;Sit to Supine     Supine to sit: Min guard Sit to supine: Min assist   General bed mobility comments: MIN A for L LE to return to bed   Transfers Overall transfer level: Needs assistance Equipment used: Rolling walker (2 wheeled) Transfers: Sit to/from Stand Sit to Stand: Min assist         General transfer comment: cues for hand placement and MIN A to power up  Ambulation/Gait Ambulation/Gait assistance: Min guard Gait Distance (Feet): 100 Feet Assistive device: Rolling walker (2 wheeled) Gait Pattern/deviations: Decreased stance time - left;Decreased step length - right;Trunk flexed Gait velocity: decreased   General Gait Details: Cues for posture and step length   Stairs  General stair comments: Pt declined stair training   Wheelchair Mobility    Modified Rankin (Stroke Patients Only)       Balance                                             Cognition Arousal/Alertness: Awake/alert Behavior During Therapy: WFL for tasks assessed/performed Overall Cognitive Status: Within Functional Limits for tasks assessed                                        Exercises Total Joint Exercises Ankle Circles/Pumps: AROM;Both;10 reps;Supine Quad Sets: AROM;5 reps;Left;Supine Heel Slides: AAROM;Left;10 reps;Supine Hip ABduction/ADduction: AROM;Left;10 reps;Supine    General Comments        Pertinent Vitals/Pain Pain Assessment: Faces Faces Pain Scale: Hurts a little bit Pain Location: L knee  Pain Descriptors / Indicators: Aching;Discomfort Pain Intervention(s): Premedicated before session;Limited activity within patient's tolerance;Monitored during session;Ice applied    Home Living                      Prior Function            PT Goals (current goals can now be found in the care plan section) Acute Rehab PT Goals PT Goal Formulation: With patient Time For Goal Achievement: 09/25/18 Potential to Achieve Goals: Good Progress towards PT goals: Progressing toward goals    Frequency    7X/week      PT Plan Current plan remains appropriate    Co-evaluation  AM-PAC PT "6 Clicks" Daily Activity  Outcome Measure  Difficulty turning over in bed (including adjusting bedclothes, sheets and blankets)?: A Little Difficulty moving from lying on back to sitting on the side of the bed? : A Lot Difficulty sitting down on and standing up from a chair with arms (e.g., wheelchair, bedside commode, etc,.)?: A Little Help needed moving to and from a bed to chair (including a wheelchair)?: A Little Help needed walking in hospital room?: A Little Help needed climbing 3-5 steps with a railing? : A Little 6 Click Score: 17    End of Session Equipment Utilized During Treatment: Gait belt;Left knee immobilizer Activity Tolerance: Patient tolerated treatment well Patient left: in bed;with call  bell/phone within reach;with bed alarm set;with family/visitor present Nurse Communication: Mobility status PT Visit Diagnosis: Other abnormalities of gait and mobility (R26.89);Difficulty in walking, not elsewhere classified (R26.2);Pain Pain - Right/Left: Left Pain - part of body: Knee     Time: 1430-1454 PT Time Calculation (min) (ACUTE ONLY): 24 min  Charges:  $Gait Training: 8-22 mins $Therapeutic Exercise: 8-22 mins                      Autumn Price, [place] Pager [phone] 09/13/2018    Autumn Price 09/13/2018, 3:24 PM

## 2018-09-14 ENCOUNTER — Encounter (HOSPITAL_COMMUNITY): Payer: Self-pay | Admitting: Specialist

## 2018-09-16 DIAGNOSIS — I10 Essential (primary) hypertension: Secondary | ICD-10-CM | POA: Diagnosis not present

## 2018-09-16 DIAGNOSIS — Z471 Aftercare following joint replacement surgery: Secondary | ICD-10-CM | POA: Diagnosis not present

## 2018-09-16 DIAGNOSIS — Z9181 History of falling: Secondary | ICD-10-CM | POA: Diagnosis not present

## 2018-09-16 DIAGNOSIS — Z7901 Long term (current) use of anticoagulants: Secondary | ICD-10-CM | POA: Diagnosis not present

## 2018-09-16 DIAGNOSIS — I872 Venous insufficiency (chronic) (peripheral): Secondary | ICD-10-CM | POA: Diagnosis not present

## 2018-09-16 DIAGNOSIS — Z96653 Presence of artificial knee joint, bilateral: Secondary | ICD-10-CM | POA: Diagnosis not present

## 2018-09-16 DIAGNOSIS — M150 Primary generalized (osteo)arthritis: Secondary | ICD-10-CM | POA: Diagnosis not present

## 2018-09-18 DIAGNOSIS — Z471 Aftercare following joint replacement surgery: Secondary | ICD-10-CM | POA: Diagnosis not present

## 2018-09-18 DIAGNOSIS — Z96652 Presence of left artificial knee joint: Secondary | ICD-10-CM | POA: Diagnosis not present

## 2018-09-25 NOTE — Discharge Summary (Signed)
Physician Discharge Summary  Patient ID: Autumn Price MRN: [id number] DOB/AGE: [birth year] 75 y.o.  Admit date: 09/11/2018 09/13/18 Admission Diagnoses:   Discharge Diagnoses:  Active Problems:   Osteoarthritis of left knee   S/P knee replacement   Discharged Condition:good  Hospital Course:  Autumn Price is a 77 y.o. who was admitted to [facility]. They were brought to the operating room on 09/11/2018 and underwent Procedure(s): LEFT TOTAL KNEE ARTHROPLASTY.  Patient tolerated the procedure well and was later transferred to the recovery room and then to the orthopaedic floor for postoperative care.  They were given PO and IV analgesics for pain control following their surgery.  They were given 24 hours of postoperative antibiotics of  Anti-infectives (From admission, onward)   Start     Dose/Rate Route Frequency Ordered Stop   09/11/18 1400  ceFAZolin (ANCEF) IVPB 2g/100 mL premix     2 g 200 mL/hr over 30 Minutes Intravenous Every 6 hours 09/11/18 1122 09/11/18 2146   09/11/18 0600  ceFAZolin (ANCEF) IVPB 2g/100 mL premix     2 g 200 mL/hr over 30 Minutes Intravenous On call to O.R. 09/11/18 0543 09/11/18 0754     and started on DVT prophylaxis in the form of lovenox.   PT and OT were ordered for total joint protocol.  Discharge planning consulted to help with postop disposition and equipment needs.  Patient had a good night on the evening of surgery and started to get up OOB with therapy on day one.  Hemovac drain was pulled without difficulty.  Continued to work with therapy into day two.  Dressing was with normal limits.  The patient had progressed with therapy and meeting their goals. Patient was seen in rounds and was ready to go home.  Consults:n/a  Significant Diagnostic Studies: routine  Treatments:routine  Discharge Exam: Blood pressure (!) 156/51, pulse 85, temperature 97.6 F (36.4 C), temperature source Oral, resp. rate 18, height 5\' 3"  (1.6 m), weight  106.1 kg, SpO2 96 %. Alert and oriented x3. RRR, Lungs clear, BS x4. Left Calf soft and non tender. L knee dressing C/D/I. No DVT signs. No signs of infection or compartment syndrome. LLE grossly neurovascularly intact.   Disposition:   Discharge Instructions    Call MD / Call 911   Complete by:  As directed    If you experience chest pain or shortness of breath, CALL 911 and be transported to the hospital emergency room.  If you develope a fever above 101 F, pus (white drainage) or increased drainage or redness at the wound, or calf pain, call your surgeon's office.   Call MD / Call 911   Complete by:  As directed    If you experience chest pain or shortness of breath, CALL 911 and be transported to the hospital emergency room.  If you develope a fever above 101 F, pus (white drainage) or increased drainage or redness at the wound, or calf pain, call your surgeon's office.   Constipation Prevention   Complete by:  As directed    Drink plenty of fluids.  Prune juice may be helpful.  You may use a stool softener, such as Colace (over the counter) 100 mg twice a day.  Use MiraLax (over the counter) for constipation as needed.   Constipation Prevention   Complete by:  As directed    Drink plenty of fluids.  Prune juice may be helpful.  You may use a stool softener, such as Colace (  over the counter) 100 mg twice a day.  Use MiraLax (over the counter) for constipation as needed.   Diet - low sodium heart healthy   Complete by:  As directed    Diet - low sodium heart healthy   Complete by:  As directed    Discharge instructions   Complete by:  As directed    INSTRUCTIONS AFTER JOINT REPLACEMENT   Remove items at home which could result in a fall. This includes throw rugs or furniture in walking pathways ICE to the affected joint every three hours while awake for 30 minutes at a time, for at least the first 3-5 days, and then as needed for pain and swelling.  Continue to use ice for pain and  swelling. You may notice swelling that will progress down to the foot and ankle.  This is normal after surgery.  Elevate your leg when you are not up walking on it.   Continue to use the breathing machine you got in the hospital (incentive spirometer) which will help keep your temperature down.  It is common for your temperature to cycle up and down following surgery, especially at night when you are not up moving around and exerting yourself.  The breathing machine keeps your lungs expanded and your temperature down.   DIET:  As you were doing prior to hospitalization, we recommend a well-balanced diet.  DRESSING / WOUND CARE / SHOWERING  Keep the surgical dressing until follow up.  The dressing is water proof, so you can shower without any extra covering.  IF THE DRESSING FALLS OFF or the wound gets wet inside, change the dressing with sterile gauze.  Please use good hand washing techniques before changing the dressing.  Do not use any lotions or creams on the incision until instructed by your surgeon.    ACTIVITY  Increase activity slowly as tolerated, but follow the weight bearing instructions below.   No driving for 6 weeks or until further direction given by your physician.  You cannot drive while taking narcotics.  No lifting or carrying greater than 10 lbs. until further directed by your surgeon. Avoid periods of inactivity such as sitting longer than an hour when not asleep. This helps prevent blood clots.  You may return to work once you are authorized by your doctor.     WEIGHT BEARING   Weight bearing as tolerated with assist device (walker, cane, etc) as directed, use it as long as suggested by your surgeon or therapist, typically at least 4-6 weeks.   EXERCISES  Results after joint replacement surgery are often greatly improved when you follow the exercise, range of motion and muscle strengthening exercises prescribed by your doctor. Safety measures are also important to  protect the joint from further injury. Any time any of these exercises cause you to have increased pain or swelling, decrease what you are doing until you are comfortable again and then slowly increase them. If you have problems or questions, call your caregiver or physical therapist for advice.   Rehabilitation is important following a joint replacement. After just a few days of immobilization, the muscles of the leg can become weakened and shrink (atrophy).  These exercises are designed to build up the tone and strength of the thigh and leg muscles and to improve motion. Often times heat used for twenty to thirty minutes before working out will loosen up your tissues and help with improving the range of motion but do not use heat for the  first two weeks following surgery (sometimes heat can increase post-operative swelling).   These exercises can be done on a training (exercise) mat, on the floor, on a table or on a bed. Use whatever works the best and is most comfortable for you.    Use music or television while you are exercising so that the exercises are a pleasant break in your day. This will make your life better with the exercises acting as a break in your routine that you can look forward to.   Perform all exercises about fifteen times, three times per day or as directed.  You should exercise both the operative leg and the other leg as well.   Exercises include:   Quad Sets - Tighten up the muscle on the front of the thigh (Quad) and hold for 5-10 seconds.   Straight Leg Raises - With your knee straight (if you were given a brace, keep it on), lift the leg to 60 degrees, hold for 3 seconds, and slowly lower the leg.  Perform this exercise against resistance later as your leg gets stronger.  Leg Slides: Lying on your back, slowly slide your foot toward your buttocks, bending your knee up off the floor (only go as far as is comfortable). Then slowly slide your foot back down until your leg is flat  on the floor again.  Angel Wings: Lying on your back spread your legs to the side as far apart as you can without causing discomfort.  Hamstring Strength:  Lying on your back, push your heel against the floor with your leg straight by tightening up the muscles of your buttocks.  Repeat, but this time bend your knee to a comfortable angle, and push your heel against the floor.  You may put a pillow under the heel to make it more comfortable if necessary.   A rehabilitation program following joint replacement surgery can speed recovery and prevent re-injury in the future due to weakened muscles. Contact your doctor or a physical therapist for more information on knee rehabilitation.    CONSTIPATION  Constipation is defined medically as fewer than three stools per week and severe constipation as less than one stool per week.  Even if you have a regular bowel pattern at home, your normal regimen is likely to be disrupted due to multiple reasons following surgery.  Combination of anesthesia, postoperative narcotics, change in appetite and fluid intake all can affect your bowels.   YOU MUST use at least one of the following options; they are listed in order of increasing strength to get the job done.  They are all available over the counter, and you may need to use some, POSSIBLY even all of these options:    Drink plenty of fluids (prune juice may be helpful) and high fiber foods Colace 100 mg by mouth twice a day  Senokot for constipation as directed and as needed Dulcolax (bisacodyl), take with full glass of water  Miralax (polyethylene glycol) once or twice a day as needed.  If you have tried all these things and are unable to have a bowel movement in the first 3-4 days after surgery call either your surgeon or your primary doctor.    If you experience loose stools or diarrhea, hold the medications until you stool forms back up.  If your symptoms do not get better within 1 week or if they get  worse, check with your doctor.  If you experience "the worst abdominal pain ever" or develop nausea  or vomiting, please contact the office immediately for further recommendations for treatment.   ITCHING:  If you experience itching with your medications, try taking only a single pain pill, or even half a pain pill at a time.  You can also use Benadryl over the counter for itching or also to help with sleep.   TED HOSE STOCKINGS:  Use stockings on both legs until for at least 2 weeks or as directed by physician office. They may be removed at night for sleeping.  MEDICATIONS:  See your medication summary on the "After Visit Summary" that nursing will review with you.  You may have some home medications which will be placed on hold until you complete the course of blood thinner medication.  It is important for you to complete the blood thinner medication as prescribed.  PRECAUTIONS:  If you experience chest pain or shortness of breath - call 911 immediately for transfer to the hospital emergency department.   If you develop a fever greater that 101 F, purulent drainage from wound, increased redness or drainage from wound, foul odor from the wound/dressing, or calf pain - CONTACT YOUR SURGEON.                                                   FOLLOW-UP APPOINTMENTS:  If you do not already have a post-op appointment, please call the office for an appointment to be seen by your surgeon.  Guidelines for how soon to be seen are listed in your "After Visit Summary", but are typically between 1-4 weeks after surgery.  OTHER INSTRUCTIONS:   Knee Replacement:  Do not place pillow under knee, focus on keeping the knee straight while resting. CPM instructions: 0-90 degrees, 2 hours in the morning, 2 hours in the afternoon, and 2 hours in the evening. Place foam block, curve side up under heel at all times except when in CPM or when walking.  DO NOT modify, tear, cut, or change the foam block in any way.  MAKE  SURE YOU:  Understand these instructions.  Get help right away if you are not doing well or get worse.    Thank you for letting us be a part of your medical care team.  It is a privilege we respect greatly.  We hope these instructions will help you stay on track for a fast and full recovery!   Increase activity slowly as tolerated   Complete by:  As directed    Increase activity slowly as tolerated   Complete by:  As directed      Allergies as of 09/13/2018   No Known Allergies     Medication List    STOP taking these medications   diclofenac sodium 1 % Gel Commonly known as:  VOLTAREN   traMADol 50 MG tablet Commonly known as:  ULTRAM     TAKE these medications   acetaminophen 500 MG tablet Commonly known as:  TYLENOL Take 500 mg by mouth every 6 (six) hours as needed for mild pain or moderate pain.   amLODipine 5 MG tablet Commonly known as:  NORVASC Take 1 tablet (5 mg total) by mouth daily.   ketoconazole 2 % cream Commonly known as:  NIZORAL Apply 1 application topically 2 (two) times daily as needed for irritation.   sertraline 50 MG tablet Commonly known as:  ZOLOFT Take 1.5 tablets (75 mg total) by mouth daily.      Follow-up Information    [facility] Follow up.   Specialty:  Home Health Services Why:  physical therapy Contact information: [address] [place] [address] [place] [address] [phone]           Signed: Markham Jordan 09/25/2018, 2:04 PM

## 2018-09-29 DIAGNOSIS — Z9181 History of falling: Secondary | ICD-10-CM | POA: Diagnosis not present

## 2018-09-29 DIAGNOSIS — I10 Essential (primary) hypertension: Secondary | ICD-10-CM | POA: Diagnosis not present

## 2018-09-29 DIAGNOSIS — Z7901 Long term (current) use of anticoagulants: Secondary | ICD-10-CM | POA: Diagnosis not present

## 2018-09-29 DIAGNOSIS — Z96653 Presence of artificial knee joint, bilateral: Secondary | ICD-10-CM | POA: Diagnosis not present

## 2018-09-29 DIAGNOSIS — M150 Primary generalized (osteo)arthritis: Secondary | ICD-10-CM | POA: Diagnosis not present

## 2018-09-29 DIAGNOSIS — Z471 Aftercare following joint replacement surgery: Secondary | ICD-10-CM | POA: Diagnosis not present

## 2018-09-29 DIAGNOSIS — I872 Venous insufficiency (chronic) (peripheral): Secondary | ICD-10-CM | POA: Diagnosis not present

## 2018-10-01 DIAGNOSIS — Z471 Aftercare following joint replacement surgery: Secondary | ICD-10-CM | POA: Diagnosis not present

## 2018-10-01 DIAGNOSIS — Z7901 Long term (current) use of anticoagulants: Secondary | ICD-10-CM | POA: Diagnosis not present

## 2018-10-01 DIAGNOSIS — Z9181 History of falling: Secondary | ICD-10-CM | POA: Diagnosis not present

## 2018-10-01 DIAGNOSIS — M150 Primary generalized (osteo)arthritis: Secondary | ICD-10-CM | POA: Diagnosis not present

## 2018-10-01 DIAGNOSIS — I872 Venous insufficiency (chronic) (peripheral): Secondary | ICD-10-CM | POA: Diagnosis not present

## 2018-10-01 DIAGNOSIS — I10 Essential (primary) hypertension: Secondary | ICD-10-CM | POA: Diagnosis not present

## 2018-10-01 DIAGNOSIS — Z96653 Presence of artificial knee joint, bilateral: Secondary | ICD-10-CM | POA: Diagnosis not present

## 2018-10-02 DIAGNOSIS — I872 Venous insufficiency (chronic) (peripheral): Secondary | ICD-10-CM | POA: Diagnosis not present

## 2018-10-02 DIAGNOSIS — Z471 Aftercare following joint replacement surgery: Secondary | ICD-10-CM | POA: Diagnosis not present

## 2018-10-02 DIAGNOSIS — M150 Primary generalized (osteo)arthritis: Secondary | ICD-10-CM | POA: Diagnosis not present

## 2018-10-02 DIAGNOSIS — Z7901 Long term (current) use of anticoagulants: Secondary | ICD-10-CM | POA: Diagnosis not present

## 2018-10-02 DIAGNOSIS — Z96653 Presence of artificial knee joint, bilateral: Secondary | ICD-10-CM | POA: Diagnosis not present

## 2018-10-02 DIAGNOSIS — I10 Essential (primary) hypertension: Secondary | ICD-10-CM | POA: Diagnosis not present

## 2018-10-02 DIAGNOSIS — Z9181 History of falling: Secondary | ICD-10-CM | POA: Diagnosis not present

## 2018-10-05 DIAGNOSIS — Z471 Aftercare following joint replacement surgery: Secondary | ICD-10-CM | POA: Diagnosis not present

## 2018-10-05 DIAGNOSIS — Z9181 History of falling: Secondary | ICD-10-CM | POA: Diagnosis not present

## 2018-10-05 DIAGNOSIS — Z7901 Long term (current) use of anticoagulants: Secondary | ICD-10-CM | POA: Diagnosis not present

## 2018-10-05 DIAGNOSIS — M150 Primary generalized (osteo)arthritis: Secondary | ICD-10-CM | POA: Diagnosis not present

## 2018-10-05 DIAGNOSIS — I872 Venous insufficiency (chronic) (peripheral): Secondary | ICD-10-CM | POA: Diagnosis not present

## 2018-10-05 DIAGNOSIS — I10 Essential (primary) hypertension: Secondary | ICD-10-CM | POA: Diagnosis not present

## 2018-10-05 DIAGNOSIS — Z96653 Presence of artificial knee joint, bilateral: Secondary | ICD-10-CM | POA: Diagnosis not present

## 2018-10-08 DIAGNOSIS — Z471 Aftercare following joint replacement surgery: Secondary | ICD-10-CM | POA: Diagnosis not present

## 2018-10-08 DIAGNOSIS — M150 Primary generalized (osteo)arthritis: Secondary | ICD-10-CM | POA: Diagnosis not present

## 2018-10-08 DIAGNOSIS — I872 Venous insufficiency (chronic) (peripheral): Secondary | ICD-10-CM | POA: Diagnosis not present

## 2018-10-08 DIAGNOSIS — I10 Essential (primary) hypertension: Secondary | ICD-10-CM | POA: Diagnosis not present

## 2018-10-08 DIAGNOSIS — Z7901 Long term (current) use of anticoagulants: Secondary | ICD-10-CM | POA: Diagnosis not present

## 2018-10-08 DIAGNOSIS — Z9181 History of falling: Secondary | ICD-10-CM | POA: Diagnosis not present

## 2018-10-08 DIAGNOSIS — Z96653 Presence of artificial knee joint, bilateral: Secondary | ICD-10-CM | POA: Diagnosis not present

## 2018-10-12 DIAGNOSIS — Z471 Aftercare following joint replacement surgery: Secondary | ICD-10-CM | POA: Diagnosis not present

## 2018-10-12 DIAGNOSIS — M25562 Pain in left knee: Secondary | ICD-10-CM | POA: Diagnosis not present

## 2018-10-14 DIAGNOSIS — Z471 Aftercare following joint replacement surgery: Secondary | ICD-10-CM | POA: Diagnosis not present

## 2018-10-14 DIAGNOSIS — M25562 Pain in left knee: Secondary | ICD-10-CM | POA: Diagnosis not present

## 2018-10-20 DIAGNOSIS — Z471 Aftercare following joint replacement surgery: Secondary | ICD-10-CM | POA: Diagnosis not present

## 2018-10-20 DIAGNOSIS — M25562 Pain in left knee: Secondary | ICD-10-CM | POA: Diagnosis not present

## 2018-10-23 DIAGNOSIS — M25562 Pain in left knee: Secondary | ICD-10-CM | POA: Diagnosis not present

## 2018-10-23 DIAGNOSIS — Z471 Aftercare following joint replacement surgery: Secondary | ICD-10-CM | POA: Diagnosis not present

## 2018-10-26 DIAGNOSIS — M25562 Pain in left knee: Secondary | ICD-10-CM | POA: Diagnosis not present

## 2018-10-26 DIAGNOSIS — Z471 Aftercare following joint replacement surgery: Secondary | ICD-10-CM | POA: Diagnosis not present

## 2018-10-29 DIAGNOSIS — M25562 Pain in left knee: Secondary | ICD-10-CM | POA: Diagnosis not present

## 2018-10-29 DIAGNOSIS — Z471 Aftercare following joint replacement surgery: Secondary | ICD-10-CM | POA: Diagnosis not present

## 2018-10-30 ENCOUNTER — Telehealth: Payer: Self-pay

## 2018-10-30 NOTE — Telephone Encounter (Signed)
[name] with [facility] calling to get order for prevnar 13; advised renee pt received prevnar 13 on 06/10/14. [name] voiced understanding and said pt would not need to repeat the prevnar 13 and nothing further needed.

## 2018-11-03 DIAGNOSIS — Z471 Aftercare following joint replacement surgery: Secondary | ICD-10-CM | POA: Diagnosis not present

## 2018-11-03 DIAGNOSIS — M25562 Pain in left knee: Secondary | ICD-10-CM | POA: Diagnosis not present

## 2018-11-05 DIAGNOSIS — Z471 Aftercare following joint replacement surgery: Secondary | ICD-10-CM | POA: Diagnosis not present

## 2018-11-05 DIAGNOSIS — M25562 Pain in left knee: Secondary | ICD-10-CM | POA: Diagnosis not present

## 2018-11-11 DIAGNOSIS — Z471 Aftercare following joint replacement surgery: Secondary | ICD-10-CM | POA: Diagnosis not present

## 2018-11-11 DIAGNOSIS — M25562 Pain in left knee: Secondary | ICD-10-CM | POA: Diagnosis not present

## 2018-11-17 DIAGNOSIS — Z471 Aftercare following joint replacement surgery: Secondary | ICD-10-CM | POA: Diagnosis not present

## 2018-11-17 DIAGNOSIS — M25562 Pain in left knee: Secondary | ICD-10-CM | POA: Diagnosis not present

## 2018-11-19 ENCOUNTER — Telehealth: Payer: Self-pay

## 2018-11-19 DIAGNOSIS — M25562 Pain in left knee: Secondary | ICD-10-CM | POA: Diagnosis not present

## 2018-11-19 DIAGNOSIS — Z471 Aftercare following joint replacement surgery: Secondary | ICD-10-CM | POA: Diagnosis not present

## 2018-11-19 MED ORDER — TRAMADOL HCL 50 MG PO TABS: 100.0000 mg | ORAL_TABLET | Freq: Two times a day (BID) | ORAL | 0 refills | Status: DC | PRN

## 2018-11-19 NOTE — Telephone Encounter (Signed)
Last filled 08-21-18 #120 Last OV 09-10-18 Next OV 09-14-19 [facility]

## 2018-11-23 DIAGNOSIS — M25562 Pain in left knee: Secondary | ICD-10-CM | POA: Diagnosis not present

## 2018-11-23 DIAGNOSIS — Z471 Aftercare following joint replacement surgery: Secondary | ICD-10-CM | POA: Diagnosis not present

## 2018-11-27 DIAGNOSIS — M25562 Pain in left knee: Secondary | ICD-10-CM | POA: Diagnosis not present

## 2018-11-27 DIAGNOSIS — Z471 Aftercare following joint replacement surgery: Secondary | ICD-10-CM | POA: Diagnosis not present

## 2018-12-01 DIAGNOSIS — M25562 Pain in left knee: Secondary | ICD-10-CM | POA: Diagnosis not present

## 2018-12-01 DIAGNOSIS — Z471 Aftercare following joint replacement surgery: Secondary | ICD-10-CM | POA: Diagnosis not present

## 2018-12-02 DIAGNOSIS — Z96653 Presence of artificial knee joint, bilateral: Secondary | ICD-10-CM | POA: Diagnosis not present

## 2018-12-02 DIAGNOSIS — Z471 Aftercare following joint replacement surgery: Secondary | ICD-10-CM | POA: Diagnosis not present

## 2018-12-21 ENCOUNTER — Other Ambulatory Visit: Payer: Self-pay

## 2018-12-21 MED ORDER — TRAMADOL HCL 50 MG PO TABS: 100.0000 mg | ORAL_TABLET | Freq: Two times a day (BID) | ORAL | 0 refills | Status: DC | PRN

## 2018-12-21 NOTE — Telephone Encounter (Signed)
Last filled 11-19-18 #120 Last OV 09-10-18 Next OV 09-14-19 [facility]

## 2019-01-21 ENCOUNTER — Other Ambulatory Visit: Payer: Self-pay

## 2019-01-21 MED ORDER — TRAMADOL HCL 50 MG PO TABS: 100.0000 mg | ORAL_TABLET | Freq: Two times a day (BID) | ORAL | 0 refills | Status: DC | PRN

## 2019-01-21 NOTE — Telephone Encounter (Signed)
Last filled 12-21-18 #120 Last OV 09-10-18 Next OV 09-14-19 [facility]

## 2019-02-19 ENCOUNTER — Other Ambulatory Visit: Payer: Self-pay | Admitting: Family Medicine

## 2019-02-19 ENCOUNTER — Other Ambulatory Visit: Payer: Self-pay

## 2019-02-19 DIAGNOSIS — F39 Unspecified mood [affective] disorder: Secondary | ICD-10-CM

## 2019-02-19 MED ORDER — TRAMADOL HCL 50 MG PO TABS: 100.0000 mg | ORAL_TABLET | Freq: Two times a day (BID) | ORAL | 0 refills | Status: DC | PRN

## 2019-02-19 NOTE — Telephone Encounter (Signed)
Last filled 01-21-19 #120 Last OV 09-10-18 Next OV 09-14-19 [facility]

## 2019-02-19 NOTE — Addendum Note (Signed)
Addended by: Lorre Munroe on: 02/19/2019 04:55 PM   Modules accepted: Orders

## 2019-03-23 ENCOUNTER — Telehealth: Payer: Self-pay

## 2019-03-23 NOTE — Telephone Encounter (Signed)
Inbound call to triage - patient requests refill for Tramadol. Last fill on 02/19/19. 120 pills. [facility].

## 2019-03-24 MED ORDER — TRAMADOL HCL 50 MG PO TABS: 100.0000 mg | ORAL_TABLET | Freq: Two times a day (BID) | ORAL | 0 refills | Status: DC | PRN

## 2019-04-23 ENCOUNTER — Other Ambulatory Visit: Payer: Self-pay | Admitting: Internal Medicine

## 2019-04-23 NOTE — Telephone Encounter (Signed)
Renea @ [employer] [employer] [facility] [address] number [phone]

## 2019-04-23 NOTE — Telephone Encounter (Signed)
Last filled 03-24-19 #120 Last OV 09-10-18 Next OV 09-14-19 [facility]

## 2019-05-23 ENCOUNTER — Other Ambulatory Visit: Payer: Self-pay | Admitting: Internal Medicine

## 2019-05-23 DIAGNOSIS — F39 Unspecified mood [affective] disorder: Secondary | ICD-10-CM

## 2019-05-24 DIAGNOSIS — Z96653 Presence of artificial knee joint, bilateral: Secondary | ICD-10-CM | POA: Diagnosis not present

## 2019-05-24 DIAGNOSIS — Z471 Aftercare following joint replacement surgery: Secondary | ICD-10-CM | POA: Diagnosis not present

## 2019-05-24 NOTE — Telephone Encounter (Signed)
[facility] calling to check on RX. Stated she has been faxing this for 30 days and has not received a response.

## 2019-05-24 NOTE — Telephone Encounter (Signed)
We never received a fax. But it looks like they finally sent it electronically. I will send it in.

## 2019-05-25 ENCOUNTER — Other Ambulatory Visit: Payer: Self-pay | Admitting: Internal Medicine

## 2019-05-25 NOTE — Telephone Encounter (Signed)
Last filled 04-23-19 #120 Last OV 09-10-18 Next OV 09-14-19 [facility]

## 2019-06-25 ENCOUNTER — Other Ambulatory Visit: Payer: Self-pay | Admitting: Internal Medicine

## 2019-06-25 NOTE — Telephone Encounter (Signed)
Last filled 05-26-19 #120 Last OV 09-10-18 Next OV 09-14-19 [facility]

## 2019-07-09 DIAGNOSIS — N3281 Overactive bladder: Secondary | ICD-10-CM | POA: Diagnosis not present

## 2019-07-25 ENCOUNTER — Other Ambulatory Visit: Payer: Self-pay | Admitting: Internal Medicine

## 2019-07-26 ENCOUNTER — Telehealth: Payer: Self-pay | Admitting: Internal Medicine

## 2019-07-26 NOTE — Telephone Encounter (Signed)
Patient's daughter-in-law, Cecille Rubin, called (not on DPR).  Patient has phone call scheduled with Dr.Letvak on 07/28/19.  Cecille Rubin said patient has had cough and sinus drainage for months.  Cecille Rubin said it's in her chest and she's afraid that she'll get bronchitis.  Cecille Rubin said patient doesn't have a fever.  Cecille Rubin wants to know if patient can come in to the office to be checked,  Lori's concerned that Dr.Letvak won't be able to listen to patient's lungs on the phone. Cecille Rubin would like for patient to be able to see Dr.Letvak sooner, also.

## 2019-07-26 NOTE — Telephone Encounter (Signed)
Spoke to pt. She wants to wait and talk to Dr Silvio Pate. Right now it will just be a phone visit. She will ask her DIL to come do a video visit if possible

## 2019-07-26 NOTE — Telephone Encounter (Signed)
I spoke to the pt. She said she thinks she can wait until the possible appt on 07-28-19. I advised her that we are keeping sick people out of the office to keep exposure down. May need to go to an UC.

## 2019-07-26 NOTE — Telephone Encounter (Signed)
I will wait and see what Junie Panning and Leafy Ro say before I contact her.

## 2019-07-26 NOTE — Telephone Encounter (Signed)
If clinically appropriate she should go to urgent care.  Based upon the criteria everyone has agreed upon anyone with cough, congestion or upper resp. Symptoms should do a virtual visit.  I am not clinical though so do not want to give clinical direction specific to this patient.

## 2019-07-26 NOTE — Telephone Encounter (Signed)
I would go ahead and plan on a virtual visit on 8/12

## 2019-07-26 NOTE — Telephone Encounter (Signed)
I could push her up to today at 4:15 (or really 4:30)PM Not sure if we are allowed to see her in the office with her symptoms--even using the side door. Will need to check on this--and make sure the patient agrees with all this

## 2019-07-26 NOTE — Telephone Encounter (Signed)
Last filled 06-25-19 #120 Last OV 09-10-18 Next OV 07-28-19 [facility]

## 2019-07-28 ENCOUNTER — Other Ambulatory Visit: Payer: Self-pay

## 2019-07-28 ENCOUNTER — Encounter: Payer: Self-pay | Admitting: Internal Medicine

## 2019-07-28 ENCOUNTER — Ambulatory Visit (INDEPENDENT_AMBULATORY_CARE_PROVIDER_SITE_OTHER): Payer: Medicare Other | Admitting: Internal Medicine

## 2019-07-28 DIAGNOSIS — Z20822 Contact with and (suspected) exposure to covid-19: Secondary | ICD-10-CM

## 2019-07-28 DIAGNOSIS — J0110 Acute frontal sinusitis, unspecified: Secondary | ICD-10-CM | POA: Insufficient documentation

## 2019-07-28 MED ORDER — AMOXICILLIN 500 MG PO TABS: 1000.0000 mg | ORAL_TABLET | Freq: Two times a day (BID) | ORAL | 0 refills | Status: AC

## 2019-07-28 NOTE — Assessment & Plan Note (Signed)
Her symptoms are persisting after a week Unlikely to be COVID--but certainly possible. Family is very concerned. I asked her to go to [facility] for testing Will try empiric amoxil for this in case standard bacterial sinusitis Discussed quarantine till symptoms gone at least 1 day

## 2019-07-28 NOTE — Addendum Note (Signed)
Addended by: [name] I on: 07/28/2019 11:20 AM   Modules accepted: Level of Service

## 2019-07-28 NOTE — Progress Notes (Signed)
Subjective:    Patient ID: Autumn Price, female    DOB: Jan 09, 1941, 78 y.o.   MRN: [id number]  HPI Virtual visit due to persistent cough  Interactive audio and video telecommunications were attempted between this provider and patient, however failed, due to patient having technical difficulties OR patient did not have access to video capability.  We continued and completed visit with audio only.   Virtual Visit via Telephone Note  I connected with Autumn Price on 07/28/19 at  8:45 AM EDT by telephone and verified that I am speaking with the correct person using two identifiers.  Location: Patient: home Provider: office   I discussed the limitations, risks, security and privacy concerns of performing an evaluation and management service by telephone and the availability of in person appointments. I also discussed with the patient that there may be a patient responsible charge related to this service. The patient expressed understanding and agreed to proceed.   History of Present Illness: Started about a week ago---started just like her typical bronchitis Lots of sinus drainage Only sporadic cough in spells--like with drainage No fever Not SOB No sore throat Headache is frontal now No ear pain  Takes tylenol for headache  Current Outpatient Medications on File Prior to Visit  Medication Sig Dispense Refill  . acetaminophen (TYLENOL) 500 MG tablet Take 500 mg by mouth every 6 (six) hours as needed for mild pain or moderate pain.     Marland Kitchen. amLODipine (NORVASC) 5 MG tablet Take 1 tablet (5 mg total) by mouth daily. 30 tablet 11  . ketoconazole (NIZORAL) 2 % cream Apply 1 application topically 2 (two) times daily as needed for irritation. 30 g 3  . sertraline (ZOLOFT) 50 MG tablet TAKE 1 AND 1/2 TABLETS BY MOUTH ONCE DAILY 45 tablet 11  . solifenacin (VESICARE) 10 MG tablet Take 10 mg by mouth daily.    . traMADol (ULTRAM) 50 MG tablet TAKE TWO TABLETS BY MOUTH TWICE DAILY AS  NEEDED MODERATE PAIN 120 tablet 0   No current facility-administered medications on file prior to visit.     No Known Allergies  Past Medical History:  Diagnosis Date  . Depression   . Hypertension   . Osteoarthritis   . Urge incontinence   . Venous insufficiency     Past Surgical History:  Procedure Laterality Date  . APPENDECTOMY  1966  . DILATION AND CURETTAGE OF UTERUS  01/27/14   benign  . JOINT REPLACEMENT     right knee  . TOTAL KNEE ARTHROPLASTY Right 03/20/2018   Procedure: RIGHT TOTAL KNEE ARTHROPLASTY;  Surgeon: Eugenia Mcalpineollins, Robert, MD;  Location: [facility];  Service: Orthopedics;  Laterality: Right;  . TOTAL KNEE ARTHROPLASTY Left 09/11/2018   Procedure: LEFT TOTAL KNEE ARTHROPLASTY;  Surgeon: Eugenia Mcalpineollins, Robert, MD;  Location: [facility];  Service: Orthopedics;  Laterality: Left;  . TUBAL LIGATION  1966    Family History  Problem Relation Age of Onset  . Heart disease Father        died age 76- MI  . Alcohol abuse Brother   . Heart disease Brother   . Heart disease Sister   . Breast cancer Neg Hx     Social History   Socioeconomic History  . Marital status: Divorced    Spouse name: Not on file  . Number of children: 3  . Years of education: Not on file  . Highest education level: Not on file  Occupational History  . 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  Comment: [employer] and finishing  Social Needs  . Financial resource strain: Not on file  . Food insecurity    Worry: Not on file    Inability: Not on file  . Transportation needs    Medical: Not on file    Non-medical: Not on file  Tobacco Use  . Smoking status: Never Smoker  . Smokeless tobacco: Never Used  Substance and Sexual Activity  . Alcohol use: No  . Drug use: No  . Sexual activity: Not on file  Lifestyle  . Physical activity    Days per week: Not on file    Minutes per session: Not on file  . Stress: Not on file  Relationships  . Social Herbalist on phone: Not on file    Gets  together: Not on file    Attends religious service: Not on file    Active member of club or organization: Not on file    Attends meetings of clubs or organizations: Not on file    Relationship status: Not on file  . Intimate partner violence    Fear of current or ex partner: Not on file    Emotionally abused: Not on file    Physically abused: Not on file    Forced sexual activity: Not on file  Other Topics Concern  . Not on file  Social History Narrative   Has living will   Designated son Alcide Goodness and other children to make health care decisions for her   Would accept CPR/resuscitation   Hasn't considered tube feedings--would leave to son   ROS Appetite is okay No change in taste or smell Has stayed home other than getting groceries Put on medication for her OAB--first gave her headache (better now)   Observations/Objective: No breathing difficulty  Normal conversation  Assessment and Plan: See problem list  Follow Up Instructions:    I discussed the assessment and treatment plan with the patient. The patient was provided an opportunity to ask questions and all were answered. The patient agreed with the plan and demonstrated an understanding of the instructions.   The patient was advised to call back or seek an in-person evaluation if the symptoms worsen or if the condition fails to improve as anticipated.  I provided 12 minutes of non-face-to-face time during this encounter.   [name], MD   Review of Systems     Objective:   Physical Exam         Assessment & Plan:

## 2019-07-29 LAB — NOVEL CORONAVIRUS, NAA: SARS-CoV-2, NAA: NOT DETECTED

## 2019-08-06 ENCOUNTER — Telehealth: Payer: Self-pay

## 2019-08-06 NOTE — Telephone Encounter (Signed)
Patient called and states that her cough has returned today. She saw Dr Silvio Pate on 07/29/2019 and was given Amoxil and was tested for COVID which was negative. Patient called and states she got up this morning and has a deep dry cough again. Post nasal drip. Finished amoxil 2 to 3 days ago and her symptoms were resolved except for sinus pressure. She does have post nasal drip, sneezing some and sinus pressure. Denies having headache, sore throat, ear pain, fever, dyspnea, chills or body aches. She has not been taking anything OTC for her symptoms. Patient would like to know what to do at this time? Please review for Dr Silvio Pate

## 2019-08-06 NOTE — Telephone Encounter (Signed)
Called and spoke with patient.  She does not have fever.  She is having postnasal drainage, little nasal congestion.  Cough is worse in the morning.  She feels it is related to the postnasal drainage.  No shortness of breath or wheeze.  Discussed taking a long-acting antihistamine as well as using an over-the-counter cough medicine like Robitussin-DM or Delsym.  She also complained of feeling dry since starting Vesicare.  Encouraged her to use saline nasal spray and to continue good fluid intake.  She was instructed to notify the office on Monday if she is not feeling better.

## 2019-08-24 ENCOUNTER — Other Ambulatory Visit: Payer: Self-pay | Admitting: Internal Medicine

## 2019-08-24 NOTE — Telephone Encounter (Signed)
[facility] called and stated that for the last 30 days they have been faxing over refill requests for the patient's  Tramadol Amlodipine   They stated they have not received anything back from the office and patient is due for refill  [address] [place]

## 2019-08-25 ENCOUNTER — Other Ambulatory Visit: Payer: Self-pay | Admitting: Internal Medicine

## 2019-08-25 NOTE — Telephone Encounter (Signed)
Tramadol  Last filled 07-26-19 #120 Last OV 07/28/2019 Next OV 09/14/2019 [facility]

## 2019-08-25 NOTE — Telephone Encounter (Signed)
These were refilled today at 2pm

## 2019-08-25 NOTE — Telephone Encounter (Signed)
Patient stated that she is out of medication and wanted to check status of refill request

## 2019-09-01 DIAGNOSIS — Z471 Aftercare following joint replacement surgery: Secondary | ICD-10-CM | POA: Diagnosis not present

## 2019-09-01 DIAGNOSIS — Z96653 Presence of artificial knee joint, bilateral: Secondary | ICD-10-CM | POA: Diagnosis not present

## 2019-09-14 ENCOUNTER — Encounter: Payer: Medicare Other | Admitting: Internal Medicine

## 2019-09-24 ENCOUNTER — Other Ambulatory Visit: Payer: Self-pay | Admitting: Internal Medicine

## 2019-09-24 NOTE — Telephone Encounter (Signed)
Last filled 08-25-19 #120 Last OV Acute 07-28-19 Next OV 12-07-19 [facility]

## 2019-09-27 ENCOUNTER — Telehealth: Payer: Self-pay | Admitting: Internal Medicine

## 2019-09-27 DIAGNOSIS — Z23 Encounter for immunization: Secondary | ICD-10-CM

## 2019-09-27 MED ORDER — SHINGRIX 50 MCG/0.5ML IM SUSR: 0.5000 mL | Freq: Once | INTRAMUSCULAR | 0 refills | Status: DC

## 2019-09-27 NOTE — Telephone Encounter (Signed)
[facility] is calling back to request the Pneumonia vaccine prescription

## 2019-09-27 NOTE — Telephone Encounter (Signed)
Rx for Shingrix sent to pharmacy as requested. 

## 2019-09-27 NOTE — Telephone Encounter (Signed)
[facility] called to request a prescription for the Shingles vaccine. Patient is needing to get this done at the [facility]-

## 2019-09-29 NOTE — Telephone Encounter (Signed)
Pt is not due Pneumovax, last was 2018

## 2019-10-06 ENCOUNTER — Ambulatory Visit (INDEPENDENT_AMBULATORY_CARE_PROVIDER_SITE_OTHER): Payer: Medicare Other | Admitting: Family Medicine

## 2019-10-06 ENCOUNTER — Encounter: Payer: Self-pay | Admitting: Family Medicine

## 2019-10-06 ENCOUNTER — Ambulatory Visit
Admission: RE | Admit: 2019-10-06 | Discharge: 2019-10-06 | Disposition: A | Discharge status: Home / Self Care | Payer: Medicare Other | Source: Ambulatory Visit | Attending: Family Medicine | Admitting: Family Medicine

## 2019-10-06 ENCOUNTER — Other Ambulatory Visit: Payer: Self-pay

## 2019-10-06 VITALS — BP 150/64 | HR 73 | Temp 98.5°F | Ht 63.0 in | Wt 258.5 lb

## 2019-10-06 DIAGNOSIS — R0600 Dyspnea, unspecified: Secondary | ICD-10-CM

## 2019-10-06 DIAGNOSIS — R600 Localized edema: Secondary | ICD-10-CM

## 2019-10-06 DIAGNOSIS — S9031XA Contusion of right foot, initial encounter: Secondary | ICD-10-CM

## 2019-10-06 DIAGNOSIS — M7989 Other specified soft tissue disorders: Secondary | ICD-10-CM | POA: Insufficient documentation

## 2019-10-06 DIAGNOSIS — R0609 Other forms of dyspnea: Secondary | ICD-10-CM

## 2019-10-06 LAB — CBC WITH DIFFERENTIAL/PLATELET
Basophils Absolute: 0.1 10*3/uL (ref 0.0–0.1)
Basophils Relative: 1.2 % (ref 0.0–3.0)
Eosinophils Absolute: 0.2 10*3/uL (ref 0.0–0.7)
Eosinophils Relative: 3.2 % (ref 0.0–5.0)
HCT: 38.7 % (ref 36.0–46.0)
Hemoglobin: 12.8 g/dL (ref 12.0–15.0)
Lymphocytes Relative: 21.9 % (ref 12.0–46.0)
Lymphs Abs: 1.1 10*3/uL (ref 0.7–4.0)
MCHC: 33.2 g/dL (ref 30.0–36.0)
MCV: 86.6 fl (ref 78.0–100.0)
Monocytes Absolute: 0.5 10*3/uL (ref 0.1–1.0)
Monocytes Relative: 9.6 % (ref 3.0–12.0)
Neutro Abs: 3.3 10*3/uL (ref 1.4–7.7)
Neutrophils Relative %: 64.1 % (ref 43.0–77.0)
Platelets: 152 10*3/uL (ref 150.0–400.0)
RBC: 4.46 Mil/uL (ref 3.87–5.11)
RDW: 15.1 % (ref 11.5–15.5)
WBC: 5.2 10*3/uL (ref 4.0–10.5)

## 2019-10-06 LAB — HEPATIC FUNCTION PANEL
ALT: 11 U/L (ref 0–35)
AST: 20 U/L (ref 0–37)
Albumin: 4.4 g/dL (ref 3.5–5.2)
Alkaline Phosphatase: 94 U/L (ref 39–117)
Bilirubin, Direct: 0.2 mg/dL (ref 0.0–0.3)
Total Bilirubin: 0.8 mg/dL (ref 0.2–1.2)
Total Protein: 7 g/dL (ref 6.0–8.3)

## 2019-10-06 LAB — BASIC METABOLIC PANEL
BUN: 14 mg/dL (ref 6–23)
CO2: 28 mEq/L (ref 19–32)
Calcium: 9.5 mg/dL (ref 8.4–10.5)
Chloride: 105 mEq/L (ref 96–112)
Creatinine, Ser: 0.79 mg/dL (ref 0.40–1.20)
GFR: 70.73 mL/min (ref >60.00)
Glucose, Bld: 95 mg/dL (ref 70–99)
Potassium: 3.7 mEq/L (ref 3.5–5.1)
Sodium: 143 mEq/L (ref 135–145)

## 2019-10-06 LAB — PROTIME-INR
INR: 1.1 ratio — ABNORMAL HIGH (ref 0.8–1.0)
Prothrombin Time: 12.6 s (ref 9.6–13.1)

## 2019-10-06 LAB — BRAIN NATRIURETIC PEPTIDE: Pro B Natriuretic peptide (BNP): 134 pg/mL — ABNORMAL HIGH (ref 0.0–100.0)

## 2019-10-06 NOTE — Progress Notes (Signed)
Autumn Casten T. Marquavius Scaife, MD Primary Care and Sports Medicine [facility] at [facility] [address] [place], [address] Phone: [phone]  FAX: [phone]  Autumn Price - 78 y.o. female  MRN [id number]  Date of Birth: 05-Aug-1941  Visit Date: 10/06/2019  PCP: Autumn Schwalbe, MD  Referred by: Autumn Schwalbe, MD  Chief Complaint  Patient presents with  . Foot Swelling    Bilateral with bruising   Subjective:   Autumn Price is a 78 y.o. very pleasant female patient who presents with the following:  Feet B are swelling.  Body mass index is 45.79 kg/m.   She is an elderly lady with no history of congestive heart failure, renal impairment, or liver dysfunction.  She presents today with a 2-day history of bilateral foot and lower extremity swelling that per her is increased over the last few days.  They are not her normal appearance per her report.  She also has some bruising on the right forefoot, which is new. No trauma.  B LE edema and bruising on the R forefoot.   09/04/2018, since then has had some Doe.   Past Medical History, Surgical History, Social History, Family History, Problem List, Medications, and Allergies have been reviewed and updated if relevant.  Patient Active Problem List   Diagnosis Date Noted  . Acute non-recurrent frontal sinusitis 07/28/2019  . Preoperative evaluation to rule out surgical contraindication 07/13/2018  . Fatigue 04/24/2018  . Primary osteoarthritis of right knee 03/20/2018  . S/P knee replacement 03/20/2018  . Osteoarthritis of left knee 08/30/2016  . Advance directive discussed with patient 08/04/2015  . Mood disorder (HCC) 06/10/2014  . Morbid obesity (HCC) 05/22/2012  . Routine general medical examination at a health care facility 03/19/2011  . Urge incontinence 03/19/2011  . Essential hypertension, benign 08/21/2010  . Chronic venous insufficiency 08/21/2010  . Osteoarthritis, multiple sites  08/21/2010    Past Medical History:  Diagnosis Date  . Depression   . Hypertension   . Osteoarthritis   . Urge incontinence   . Venous insufficiency     Past Surgical History:  Procedure Laterality Date  . APPENDECTOMY  1966  . DILATION AND CURETTAGE OF UTERUS  01/27/14   benign  . JOINT REPLACEMENT     right knee  . TOTAL KNEE ARTHROPLASTY Right 03/20/2018   Procedure: RIGHT TOTAL KNEE ARTHROPLASTY;  Surgeon: Eugenia Mcalpine, MD;  Location: [facility];  Service: Orthopedics;  Laterality: Right;  . TOTAL KNEE ARTHROPLASTY Left 09/11/2018   Procedure: LEFT TOTAL KNEE ARTHROPLASTY;  Surgeon: Eugenia Mcalpine, MD;  Location: [facility];  Service: Orthopedics;  Laterality: Left;  . TUBAL LIGATION  1966    Social History   Socioeconomic History  . Marital status: Divorced    Spouse name: Not on file  . Number of children: 3  . Years of education: Not on file  . Highest education level: Not on file  Occupational History  . 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 Financial resource strain: Not on file  . Food insecurity    Worry: Not on file    Inability: Not on file  . Transportation needs    Medical: Not on file    Non-medical: Not on file  Tobacco Use  . Smoking status: Never Smoker  . Smokeless tobacco: Never Used  Substance and Sexual Activity  . Alcohol use: No  . Drug use:  No  . Sexual activity: Not on file  Lifestyle  . Physical activity    Days per week: Not on file    Minutes per session: Not on file  . Stress: Not on file  Relationships  . Social Herbalist on phone: Not on file    Gets together: Not on file    Attends religious service: Not on file    Active member of club or organization: Not on file    Attends meetings of clubs or organizations: Not on file    Relationship status: Not on file  . Intimate partner violence    Fear of current or ex partner: Not on file    Emotionally abused: Not on  file    Physically abused: Not on file    Forced sexual activity: Not on file  Other Topics Concern  . Not on file  Social History Narrative   Has living will   Designated son Autumn Price and other children to make health care decisions for her   Would accept CPR/resuscitation   Hasn't considered tube feedings--would leave to son    Family History  Problem Relation Age of Onset  . Heart disease Father        died age 32- MI  . Alcohol abuse Brother   . Heart disease Brother   . Heart disease Sister   . Breast cancer Neg Hx     No Known Allergies  Medication list reviewed and updated in full in [facility].   GEN: No acute illnesses, no fevers, chills. GI: No n/v/d, eating normally Pulm: No SOB Interactive and getting along well at home.  Otherwise, ROS is as per the HPI.  Objective:   BP (!) 150/64   Pulse 73   Temp 98.5 F (36.9 C) (Temporal)   Ht 5\' 3"  (1.6 m)   Wt 258 lb 8 oz (117.3 kg)   SpO2 94%   BMI 45.79 kg/m   GEN: WDWN, NAD, Non-toxic, A & O x 3 HEENT: Atraumatic, Normocephalic. Neck supple. No masses, No LAD. Ears and Nose: No external deformity. CV: RRR, No M/G/R. No JVD. No thrill. No extra heart sounds. PULM: CTA B, no wheezes, crackles, rhonchi. No retractions. No resp. distress. No accessory muscle use. EXTR: 1 - 2+ LE edema and 1+ pedal edema. Bruising, R forefoot NEURO Normal gait.  PSYCH: Normally interactive. Conversant. Not depressed or anxious appearing.  Calm demeanor.   Laboratory and Imaging Data:  Assessment and Plan:     ICD-10-CM   1. Bilateral lower extremity edema  R60.0 Protime-INR    CBC with Differential/Platelet    Basic metabolic panel    Hepatic function panel    Brain natriuretic peptide    US Venous Img Lower Bilateral  2. DOE (dyspnea on exertion)  R06.00 Protime-INR    CBC with Differential/Platelet    Basic metabolic panel    Hepatic function panel    Brain natriuretic peptide    US Venous Img Lower  Bilateral  3. Foot swelling  M79.89 Protime-INR    CBC with Differential/Platelet    Basic metabolic panel    Hepatic function panel    Brain natriuretic peptide    US Venous Img Lower Bilateral  4. Contusion of right foot, initial encounter  S90.31XA Protime-INR   All labs are pending.  Swelling bilateral lower extremities of unclear origin.  Check multiple lab studies.  Ecchymosis of unclear origin.  Given acute onset, I  would like to have a bilateral DVT ultrasound to evaluate for potential DVT.  If positive, then we will need to anticoagulate the patient.  Follow-up: No follow-ups on file.  No orders of the defined types were placed in this encounter.  Orders Placed This Encounter  Procedures  . US Venous Img Lower Bilateral  . Protime-INR  . CBC with Differential/Platelet  . Basic metabolic panel  . Hepatic function panel  . Brain natriuretic peptide    Signed,  Shevon Sian T. Kylen Ismael, MD   Outpatient Encounter Medications as of 10/06/2019  Medication Sig  . acetaminophen (TYLENOL) 500 MG tablet Take 500 mg by mouth every 6 (six) hours as needed for mild pain or moderate pain.   Marland Kitchen. amLODipine (NORVASC) 5 MG tablet TAKE ONE TABLET BY MOUTH ONCE DAILY  . ketoconazole (NIZORAL) 2 % cream Apply 1 application topically 2 (two) times daily as needed for irritation.  . sertraline (ZOLOFT) 50 MG tablet TAKE 1 AND 1/2 TABLETS BY MOUTH ONCE DAILY  . solifenacin (VESICARE) 10 MG tablet Take 10 mg by mouth daily.  . traMADol (ULTRAM) 50 MG tablet TAKE TWO TABLETS BY MOUTH TWICE DAILY AS NEEDED for MODERATE PAIN   No facility-administered encounter medications on file as of 10/06/2019.

## 2019-10-08 ENCOUNTER — Encounter: Payer: Self-pay | Admitting: Emergency Medicine

## 2019-10-08 ENCOUNTER — Telehealth: Payer: Self-pay

## 2019-10-08 ENCOUNTER — Emergency Department: Payer: Medicare Other

## 2019-10-08 ENCOUNTER — Ambulatory Visit (INDEPENDENT_AMBULATORY_CARE_PROVIDER_SITE_OTHER)
Admission: EM | Admit: 2019-10-08 | Discharge: 2019-10-08 | Disposition: A | Discharge status: Other Acute Inpatient Hospital | Payer: Medicare Other | Source: Home / Self Care | Attending: Family Medicine | Admitting: Family Medicine

## 2019-10-08 ENCOUNTER — Other Ambulatory Visit: Payer: Self-pay

## 2019-10-08 ENCOUNTER — Emergency Department
Admission: EM | Admit: 2019-10-08 | Discharge: 2019-10-09 | Disposition: A | Discharge status: Home / Self Care | Payer: Medicare Other | Attending: Emergency Medicine | Admitting: Emergency Medicine

## 2019-10-08 DIAGNOSIS — R0602 Shortness of breath: Secondary | ICD-10-CM | POA: Insufficient documentation

## 2019-10-08 DIAGNOSIS — I10 Essential (primary) hypertension: Secondary | ICD-10-CM | POA: Insufficient documentation

## 2019-10-08 DIAGNOSIS — R2243 Localized swelling, mass and lump, lower limb, bilateral: Secondary | ICD-10-CM | POA: Diagnosis present

## 2019-10-08 DIAGNOSIS — R079 Chest pain, unspecified: Secondary | ICD-10-CM | POA: Diagnosis not present

## 2019-10-08 DIAGNOSIS — R609 Edema, unspecified: Secondary | ICD-10-CM | POA: Insufficient documentation

## 2019-10-08 DIAGNOSIS — I440 Atrioventricular block, first degree: Secondary | ICD-10-CM | POA: Diagnosis not present

## 2019-10-08 DIAGNOSIS — Z79899 Other long term (current) drug therapy: Secondary | ICD-10-CM | POA: Diagnosis not present

## 2019-10-08 DIAGNOSIS — R600 Localized edema: Secondary | ICD-10-CM | POA: Diagnosis not present

## 2019-10-08 DIAGNOSIS — R7989 Other specified abnormal findings of blood chemistry: Secondary | ICD-10-CM | POA: Diagnosis not present

## 2019-10-08 DIAGNOSIS — K8689 Other specified diseases of pancreas: Secondary | ICD-10-CM | POA: Diagnosis not present

## 2019-10-08 LAB — URINALYSIS, ROUTINE W REFLEX MICROSCOPIC
Bilirubin Urine: NEGATIVE
Glucose, UA: NEGATIVE mg/dL
Hgb urine dipstick: NEGATIVE
Ketones, ur: NEGATIVE mg/dL
Nitrite: NEGATIVE
Protein, ur: NEGATIVE mg/dL
Specific Gravity, Urine: 1.018 (ref 1.005–1.030)
pH: 7 (ref 5.0–8.0)

## 2019-10-08 LAB — CBC WITH DIFFERENTIAL/PLATELET
Abs Immature Granulocytes: 0.01 10*3/uL (ref 0.00–0.07)
Basophils Absolute: 0.1 10*3/uL (ref 0.0–0.1)
Basophils Relative: 1 %
Eosinophils Absolute: 0.2 10*3/uL (ref 0.0–0.5)
Eosinophils Relative: 5 %
HCT: 39.5 % (ref 36.0–46.0)
Hemoglobin: 12.6 g/dL (ref 12.0–15.0)
Immature Granulocytes: 0 %
Lymphocytes Relative: 25 %
Lymphs Abs: 1.2 10*3/uL (ref 0.7–4.0)
MCH: 28.3 pg (ref 26.0–34.0)
MCHC: 31.9 g/dL (ref 30.0–36.0)
MCV: 88.8 fL (ref 80.0–100.0)
Monocytes Absolute: 0.5 10*3/uL (ref 0.1–1.0)
Monocytes Relative: 10 %
Neutro Abs: 2.9 10*3/uL (ref 1.7–7.7)
Neutrophils Relative %: 59 %
Platelets: 143 10*3/uL — ABNORMAL LOW (ref 150–400)
RBC: 4.45 MIL/uL (ref 3.87–5.11)
RDW: 14 % (ref 11.5–15.5)
WBC: 4.9 10*3/uL (ref 4.0–10.5)
nRBC: 0 % (ref 0.0–0.2)

## 2019-10-08 LAB — BASIC METABOLIC PANEL
Anion gap: 9 (ref 5–15)
BUN: 14 mg/dL (ref 8–23)
CO2: 27 mmol/L (ref 22–32)
Calcium: 9.5 mg/dL (ref 8.9–10.3)
Chloride: 106 mmol/L (ref 98–111)
Creatinine, Ser: 0.76 mg/dL (ref 0.44–1.00)
GFR calc Af Amer: >60 mL/min (ref >60)
GFR calc non Af Amer: >60 mL/min (ref >60)
Glucose, Bld: 104 mg/dL — ABNORMAL HIGH (ref 70–99)
Potassium: 3.7 mmol/L (ref 3.5–5.1)
Sodium: 142 mmol/L (ref 135–145)

## 2019-10-08 LAB — TROPONIN I (HIGH SENSITIVITY)
Troponin I (High Sensitivity): 7 ng/L (ref <18)
Troponin I (High Sensitivity): 10 ng/L (ref <18)

## 2019-10-08 LAB — BRAIN NATRIURETIC PEPTIDE: B Natriuretic Peptide: 215 pg/mL — ABNORMAL HIGH (ref 0.0–100.0)

## 2019-10-08 MED ORDER — IOHEXOL 350 MG/ML SOLN
125.0000 mL | Freq: Once | INTRAVENOUS | Status: AC | PRN
  Administered 2019-10-08: 125 mL via INTRAVENOUS

## 2019-10-08 MED ORDER — FUROSEMIDE 20 MG PO TABS: 20.0000 mg | ORAL_TABLET | Freq: Every day | ORAL | 0 refills | Status: DC

## 2019-10-08 NOTE — ED Notes (Addendum)
Pt denies kidney and cardiac history. Swelling to feet and ankles bilat. 2+ in feet and 1+ in ankles. Doraslis pedis pulse 2+ in both feet; feet both warm and appropriate color. Pt denies pain. Bruising across R foot which pt states showed up with the swelling. Denies hitting foot. States more recent SOB. Denies CP.

## 2019-10-08 NOTE — ED Provider Notes (Addendum)
[facility] Emergency Department Provider Note  ____________________________________________   First MD Initiated Contact with Patient 10/08/19 2136     (approximate)  I have reviewed the triage vital signs and the nursing notes.   HISTORY  Chief Complaint Leg Swelling    HPI Autumn Price is a [age] y.o. female who presents with leg swelling.  Patient had sudden onset of leg swelling 5 days ago.  The swelling has been severe, constant, unable to pull up her socks, nothing makes it better, nothing makes it worse.  Patient had negative DVT ultrasounds done earlier this week.  She was then seen at urgent care [place] who recommended she be sent here for evaluation for heart failure.   She denies any prior heart issues.  No chest pain.  She has having some exertional shortness of breath associated with the leg swelling.          Past Medical History:  Diagnosis Date  . Depression   . Hypertension   . Osteoarthritis   . Urge incontinence   . Venous insufficiency     Patient Active Problem List   Diagnosis Date Noted  . Acute non-recurrent frontal sinusitis 07/28/2019  . Preoperative evaluation to rule out surgical contraindication 07/13/2018  . Fatigue 04/24/2018  . Primary osteoarthritis of right knee 03/20/2018  . S/P knee replacement 03/20/2018  . Osteoarthritis of left knee 08/30/2016  . Advance directive discussed with patient 08/04/2015  . Mood disorder (HCC) 06/10/2014  . Morbid obesity (HCC) 05/22/2012  . Routine general medical examination at a health care facility 03/19/2011  . Urge incontinence 03/19/2011  . Essential hypertension, benign 08/21/2010  . Chronic venous insufficiency 08/21/2010  . Osteoarthritis, multiple sites 08/21/2010    Past Surgical History:  Procedure Laterality Date  . APPENDECTOMY  1966  . DILATION AND CURETTAGE OF UTERUS  01/27/14   benign  . JOINT REPLACEMENT     right knee  . TOTAL KNEE ARTHROPLASTY  Right 03/20/2018   Procedure: RIGHT TOTAL KNEE ARTHROPLASTY;  Surgeon: Eugenia Mcalpineollins, Robert, MD;  Location: [facility];  Service: Orthopedics;  Laterality: Right;  . TOTAL KNEE ARTHROPLASTY Left 09/11/2018   Procedure: LEFT TOTAL KNEE ARTHROPLASTY;  Surgeon: Eugenia Mcalpineollins, Robert, MD;  Location: [facility];  Service: Orthopedics;  Laterality: Left;  . TUBAL LIGATION  1966    Prior to Admission medications   Medication Sig Start Date End Date Taking? Authorizing Provider  acetaminophen (TYLENOL) 500 MG tablet Take 500 mg by mouth every 6 (six) hours as needed for mild pain or moderate pain.     [provider]  amLODipine (NORVASC) 5 MG tablet TAKE ONE TABLET BY MOUTH ONCE DAILY 08/25/19   Karie SchwalbeLetvak, Richard I, MD  ketoconazole (NIZORAL) 2 % cream Apply 1 application topically 2 (two) times daily as needed for irritation. 09/07/18   Karie SchwalbeLetvak, Richard I, MD  sertraline (ZOLOFT) 50 MG tablet TAKE 1 AND 1/2 TABLETS BY MOUTH ONCE DAILY 05/24/19   Tillman AbideLetvak, Richard I, MD  solifenacin (VESICARE) 10 MG tablet Take 10 mg by mouth daily. 07/16/19   [provider]  traMADol (ULTRAM) 50 MG tablet TAKE TWO TABLETS BY MOUTH TWICE DAILY AS NEEDED for MODERATE PAIN 09/24/19   Karie SchwalbeLetvak, Richard I, MD    Allergies Patient has no known allergies.  Family History  Problem Relation Age of Onset  . Heart disease Father        died age 76- MI  . Alcohol abuse Brother   . Heart  disease Brother   . Heart disease Sister   . Breast cancer Neg Hx     Social History Social History   Tobacco Use  . Smoking status: Never Smoker  . Smokeless tobacco: Never Used  Substance Use Topics  . Alcohol use: No  . Drug use: No      Review of Systems Constitutional: No fever/chills Eyes: No visual changes. ENT: No sore throat. Cardiovascular: Denies chest pain. Respiratory: Positive shortness of breath Gastrointestinal: No abdominal pain.  No nausea, no vomiting.  No diarrhea.  No constipation. Genitourinary: Negative for  dysuria. Musculoskeletal: Negative for back pain.  Positive leg swelling Skin: Negative for rash. Neurological: Negative for headaches, focal weakness or numbness. All other ROS negative ____________________________________________   PHYSICAL EXAM:  VITAL SIGNS: ED Triage Vitals  Enc Vitals Group     BP 10/08/19 1833 (!) 181/64     Pulse Rate 10/08/19 1833 69     Resp 10/08/19 1833 16     Temp 10/08/19 1833 98.2 F (36.8 C)     Temp Source 10/08/19 1833 Oral     SpO2 10/08/19 1833 99 %     Weight 10/08/19 1830 255 lb (115.7 kg)     Height 10/08/19 1830 5\' 3"  (1.6 m)     Head Circumference --      Peak Flow --      Pain Score 10/08/19 1830 0     Pain Loc --      Pain Edu? --      Excl. in [place]? --     Constitutional: Alert and oriented. Well appearing and in no acute distress. Eyes: Conjunctivae are normal. EOMI. Head: Atraumatic. Nose: No congestion/rhinnorhea. Mouth/Throat: Mucous membranes are moist.   Neck: No stridor. Trachea Midline. FROM Cardiovascular: Normal rate, regular rhythm. Grossly normal heart sounds.  Good peripheral circulation. Respiratory: Normal respiratory effort.  No retractions. Lungs CTAB. Gastrointestinal: Soft and nontender. No distention. No abdominal bruits.  Musculoskeletal: 2+ edema bilaterally with some bruising on the right foot.  No joint effusions.  No tenderness on the foot.  2+ distal pulses. Neurologic:  Normal speech and language. No gross focal neurologic deficits are appreciated.  Skin:  Skin is warm, dry and intact. No rash noted. Psychiatric: Mood and affect are normal. Speech and behavior are normal. GU: Deferred   ____________________________________________   LABS (all labs ordered are listed, but only abnormal results are displayed)  Labs Reviewed  CBC WITH DIFFERENTIAL/PLATELET - Abnormal; Notable for the following components:      Result Value   Platelets 143 (*)    All other components within normal limits  BASIC  METABOLIC PANEL - Abnormal; Notable for the following components:   Glucose, Bld 104 (*)    All other components within normal limits  BRAIN NATRIURETIC PEPTIDE - Abnormal; Notable for the following components:   B Natriuretic Peptide 215.0 (*)    All other components within normal limits  URINALYSIS, ROUTINE W REFLEX MICROSCOPIC  TROPONIN I (HIGH SENSITIVITY)  TROPONIN I (HIGH SENSITIVITY)   ____________________________________________   ED ECG REPORT I, Vanessa Coupland, the attending physician, personally viewed and interpreted this ECG.  EKG is normal sinus rate of 66, no ST elevations, no T wave inversion, type I AV block ____________________________________________  RADIOLOGY Robert Bellow, personally viewed and evaluated these images (plain radiographs) as part of my medical decision making, as well as reviewing the written report by the radiologist.  ED MD interpretation: Chest x-ray  without any evidence of significant pulmonary edema.  Official radiology report(s): Dg Chest 2 View  Result Date: 10/08/2019 CLINICAL DATA:  Shortness of breath EXAM: CHEST - 2 VIEW COMPARISON:  Apr 20, 2018 FINDINGS: There is slight atelectasis in the left base. The lungs elsewhere are clear. Heart size and pulmonary vascularity are normal. No adenopathy. No pneumothorax. No bone lesions. IMPRESSION: Slight left base atelectasis. No edema or consolidation. Cardiac silhouette within normal limits. Electronically Signed   By: Bretta Bang III M.D.   On: 10/08/2019 19:17    ____________________________________________   PROCEDURES  Procedure(s) performed (including Critical Care):  Procedures   ____________________________________________   INITIAL IMPRESSION / ASSESSMENT AND PLAN / ED COURSE  Autumn Price was evaluated in Emergency Department on 10/08/2019 for the symptoms described in the history of present illness. She was evaluated in the context of the global COVID-19  pandemic, which necessitated consideration that the patient might be at risk for infection with the SARS-CoV-2 virus that causes COVID-19. Institutional protocols and algorithms that pertain to the evaluation of patients at risk for COVID-19 are in a state of rapid change based on information released by regulatory bodies including the CDC and federal and state organizations. These policies and algorithms were followed during the patient's care in the ED.    Patient is a 78 year old who presents with sudden onset bilateral leg swelling.  Patient already had liver function testing done that was negative for cirrhosis.  She also has normal kidney function and will get urine to make sure is no evidence of nephrotic syndrome.  She had a x-ray done in the did not show any evidence of pulmonary edema and her BNP is only slightly elevated.  Patient will need outpatient echocardiogram to evaluate for heart failure.  However I am concerned given the sudden onset of the swelling if patient could have an upper venous clot therefore will get CT venogram to further evaluate.  I am also concerned given the exertional shortness of breath that we could be missing a pulmonary embolism therefore will get CT PE as well.  Patient understands that if work-up is negative that we would send her home on a couple days of Lasix and have her follow-up with her primary care doctor for electrolyte recheck and to schedule an echocardiogram outpatient.  Work-up is reassuring with no evidence of anemia.  No evidence of kidney dysfunction.  BNP is slightly elevated at 215.  Troponin was at 7  1. No acute intrathoracic, abdominal, or pelvic pathology. No CT evidence of pulmonary embolism, aortic dissection or aneurysm. 2. Mild haziness of the pancreas. Correlation with pancreatic enzymes recommended to exclude pancreatitis.  No pancreas pain.   Troponins ruled her out for ACS.  Work-up has been reassuring.  Discussed with patient  getting outpatient echocardiogram.  We will give her 3 days of Lasix and have patient follow-up with her primary care doctor on Monday for electrolyte recheck and decide if they want to continue the Lasix as well as to schedule outpatient echocardiogram.  Patient is continued to be satting 96% on room air and looks very comfortable.  Patient's urine is slightly concerning for UTI but patient been on some antibiotics for recent patient abscess that she wants to hold off on antibiotics.  No dysuria at this time did had some a couple days ago.  Will send off urine culture and if results are positive we will call patient back.   ____________________________________________   FINAL CLINICAL IMPRESSION(S) /  ED DIAGNOSES   Final diagnoses:  Peripheral edema      MEDICATIONS GIVEN DURING THIS VISIT:  Medications  iohexol (OMNIPAQUE) 350 MG/ML injection 125 mL (125 mLs Intravenous Contrast Given 10/08/19 2235)     ED Discharge Orders         Ordered    furosemide (LASIX) 20 MG tablet  Daily     10/08/19 2328           Note:  This document was prepared using Dragon voice recognition software and may include unintentional dictation errors.   Concha Se, MD 10/08/19 2328    Concha Se, MD 10/08/19 2348    Concha Se, MD 10/08/19 2352

## 2019-10-08 NOTE — ED Notes (Signed)
Patient assisted to commode in room. Urine sample obtained and sent to lab. Patient assisted back to stretcher and medical equipment hooked back up. Patient denies need for anything additional at this time. She received a phone call from her daughter while this RN was in the room. Family was updated to current treatment plan and approximate time of discharge.

## 2019-10-08 NOTE — Telephone Encounter (Signed)
Negative dopplers... now bilateral edema and shortness of breath with exertion.. needs eval at urgent care.. ER if symptoms progress.

## 2019-10-08 NOTE — ED Notes (Signed)
EDP Funke at bedside.  

## 2019-10-08 NOTE — Discharge Instructions (Addendum)
You should wear compression socks and take the Lasix for 3 days.  He should follow with your primary care doctor on Monday for recheck of your electrolytes as well as to schedule an outpatient echocardiogram.  Return to the ER for worsening shortness of breath or any other concerns  1. No acute intrathoracic, abdominal, or pelvic pathology. No CT evidence of pulmonary embolism, aortic dissection or aneurysm. 2. Mild haziness of the pancreas. Correlation with pancreatic enzymes recommended to exclude pancreatitis.

## 2019-10-08 NOTE — ED Provider Notes (Signed)
[facility]    CSN: [id number] Arrival date & time: 10/08/19  1530      History   Chief Complaint Chief Complaint  Patient presents with  . Foot Swelling    bilateral    HPI Autumn Price is a 78 y.o. female.   78 yo female with a c/o progressively worsening leg swelling and shortness of breath (worse with activity) for the past week.  States she saw her PCP earlier this week and had blood work as well as a doppler US of her legs to check for blood clots (neg for DVT). Patient denies any chest pains, injuries, palpitations, fevers, chills, sick contacts, cough, wheezing.       Past Medical History:  Diagnosis Date  . Depression   . Hypertension   . Osteoarthritis   . Urge incontinence   . Venous insufficiency     Patient Active Problem List   Diagnosis Date Noted  . Acute non-recurrent frontal sinusitis 07/28/2019  . Preoperative evaluation to rule out surgical contraindication 07/13/2018  . Fatigue 04/24/2018  . Primary osteoarthritis of right knee 03/20/2018  . S/P knee replacement 03/20/2018  . Osteoarthritis of left knee 08/30/2016  . Advance directive discussed with patient 08/04/2015  . Mood disorder (HCC) 06/10/2014  . Morbid obesity (HCC) 05/22/2012  . Routine general medical examination at a health care facility 03/19/2011  . Urge incontinence 03/19/2011  . Essential hypertension, benign 08/21/2010  . Chronic venous insufficiency 08/21/2010  . Osteoarthritis, multiple sites 08/21/2010    Past Surgical History:  Procedure Laterality Date  . APPENDECTOMY  1966  . DILATION AND CURETTAGE OF UTERUS  01/27/14   benign  . JOINT REPLACEMENT     right knee  . TOTAL KNEE ARTHROPLASTY Right 03/20/2018   Procedure: RIGHT TOTAL KNEE ARTHROPLASTY;  Surgeon: Eugenia Mcalpineollins, Robert, MD;  Location: [facility];  Service: Orthopedics;  Laterality: Right;  . TOTAL KNEE ARTHROPLASTY Left 09/11/2018   Procedure: LEFT TOTAL KNEE ARTHROPLASTY;  Surgeon: Eugenia Mcalpineollins, Robert,  MD;  Location: [facility];  Service: Orthopedics;  Laterality: Left;  . TUBAL LIGATION  1966    OB History   No obstetric history on file.      Home Medications    Prior to Admission medications   Medication Sig Start Date End Date Taking? Authorizing Provider  amLODipine (NORVASC) 5 MG tablet TAKE ONE TABLET BY MOUTH ONCE DAILY 08/25/19  Yes Tillman AbideLetvak, Richard I, MD  sertraline (ZOLOFT) 50 MG tablet TAKE 1 AND 1/2 TABLETS BY MOUTH ONCE DAILY 05/24/19  Yes Karie SchwalbeLetvak, Richard I, MD  acetaminophen (TYLENOL) 500 MG tablet Take 500 mg by mouth every 6 (six) hours as needed for mild pain or moderate pain.     [provider]  ketoconazole (NIZORAL) 2 % cream Apply 1 application topically 2 (two) times daily as needed for irritation. 09/07/18   Karie SchwalbeLetvak, Richard I, MD  solifenacin (VESICARE) 10 MG tablet Take 10 mg by mouth daily. 07/16/19   [provider]  traMADol (ULTRAM) 50 MG tablet TAKE TWO TABLETS BY MOUTH TWICE DAILY AS NEEDED for MODERATE PAIN 09/24/19   Karie SchwalbeLetvak, Richard I, MD    Family History Family History  Problem Relation Age of Onset  . Heart disease Father        died age 76- MI  . Alcohol abuse Brother   . Heart disease Brother   . Heart disease Sister   . Breast cancer Neg Hx     Social History Social History  Tobacco Use  . Smoking status: Never Smoker  . Smokeless tobacco: Never Used  Substance Use Topics  . Alcohol use: No  . Drug use: No     Allergies   Patient has no known allergies.   Review of Systems Review of Systems   Physical Exam Triage Vital Signs ED Triage Vitals  Enc Vitals Group     BP 10/08/19 1543 (!) 166/73     Pulse Rate 10/08/19 1543 71     Resp 10/08/19 1543 16     Temp 10/08/19 1543 98.5 F (36.9 C)     Temp Source 10/08/19 1543 Oral     SpO2 10/08/19 1543 97 %     Weight 10/08/19 1539 254 lb (115.2 kg)     Height 10/08/19 1539 5\' 3"  (1.6 m)     Head Circumference --      Peak Flow --      Pain Score 10/08/19 1539  0     Pain Loc --      Pain Edu? --      Excl. in [place]? --    No data found.  Updated Vital Signs BP (!) 166/73 (BP Location: Right Arm)   Pulse 71   Temp 98.5 F (36.9 C) (Oral)   Resp 16   Ht 5\' 3"  (1.6 m)   Wt 115.2 kg   SpO2 97%   BMI 44.99 kg/m   Visual Acuity Right Eye Distance:   Left Eye Distance:   Bilateral Distance:    Right Eye Near:   Left Eye Near:    Bilateral Near:     Physical Exam Vitals signs and nursing note reviewed.  Constitutional:      General: She is not in acute distress.    Appearance: She is not toxic-appearing or diaphoretic.  Neck:     Musculoskeletal: Neck supple.  Cardiovascular:     Rate and Rhythm: Normal rate and regular rhythm.     Heart sounds: Normal heart sounds.  Pulmonary:     Effort: Pulmonary effort is normal. No respiratory distress.     Breath sounds: No stridor. Rales (velcro crackles at the bases bilaterally) present. No wheezing or rhonchi.  Musculoskeletal:     Right lower leg: Edema (2+) present.     Left lower leg: Edema (2+) present.  Neurological:     General: No focal deficit present.     Mental Status: She is alert.      UC Treatments / Results  Labs (all labs ordered are listed, but only abnormal results are displayed) Labs Reviewed - No data to display  EKG   Radiology No results found.  Procedures Procedures (including critical care time)  Medications Ordered in UC Medications - No data to display  Initial Impression / Assessment and Plan / UC Course  I have reviewed the triage vital signs and the nursing notes.  Pertinent labs & imaging results that were available during my care of the patient were reviewed by me and considered in my medical decision making (see chart for details).      Final Clinical Impressions(s) / UC Diagnoses   Final diagnoses:  Shortness of breath  Bilateral lower extremity edema  Elevated brain natriuretic peptide (BNP) level     Discharge Instructions      Recommend patient go to Emergency Department for further evaluation and management for possible new onset CHF    ED Prescriptions    None      1. recent  lab results (elevated BNP) and possible etiology/diagnosis reviewed with patient. Recommend patient go to Emergency Department for further evaluation and management. Patient in stable condition will proceed with daughter in law driving her.    Marland KitchenPDMP not reviewed this encounter.   Payton Mccallum, MD 10/08/19 [phone]

## 2019-10-08 NOTE — ED Notes (Signed)
Pt leaving for CT.  

## 2019-10-08 NOTE — ED Triage Notes (Signed)
Patient c/o bilateral foot swelling that started on Tuesday.  Patient denies injury or fall. Patient states that she saw her PCP on Wed and had blood work done and [place].

## 2019-10-08 NOTE — ED Triage Notes (Signed)
Pt to ED via POV c/o bilateral LE swelling. Pt was sent from [facility] urgent Care for evaluation of New onset CHF. Pt saw her PCP on Wednesday and had blood work and US done. Pt states that she called her PCP today because she noticed that she has bruising on her right foot. Pt states that she is more short of breath than normal. Pt is speaking in complete sentences at this time. Breathing is equal and unlabored.

## 2019-10-08 NOTE — ED Notes (Signed)
Autumn Price, son and DIL out in car waiting for update and for one of them to be called back to stay with pt once in room. Son [phone]; DIL [phone].

## 2019-10-08 NOTE — Telephone Encounter (Signed)
Pt seen 10/06/19 by Dr Lorelei Pont; no CP or SOB now; pt has SOB upon exertion last night. This morning the rt foot was bruised blue color but now the upper rt  foot to the toes is very dark blue and the swelling in the foot has gone 1/2 way up calf of rt leg today. There is new swelling in lt lower leg as well. No redness or pain in lower legs. No SOB now. BP 168/66 P 75. Dr Diona Browner said she is concerned about possible overload and suggest pt be eval at [facility]. 10/06/19  BNP 134.0. pt voiced understanding and will go to [facility] in [place]. fYI to Dr Lorelei Pont, Dr Diona Browner and Dr Silvio Pate as PCP.

## 2019-10-08 NOTE — Discharge Instructions (Signed)
Recommend patient go to Emergency Department for further evaluation and management for possible new onset CHF

## 2019-10-09 NOTE — ED Notes (Signed)
Patient rang out to say she had urinated on herself. States "I just can't hold it." Patient assisted to commode in the room, wipes provided to clean urine from skin. Patient given belongings bag for soiled pants. Hospital gowns provided for patient to wear home as we are out of blue scrub pants. Patient apologetic; patient reassured all was well. Bed linens changed to clean dry ones after mattress pad changed. Patient's discharge papers came up at this time and gone over while RN in the room. Patient taken to front entrance where family was waiting to pick her up. Patient assisted from wheelchair and into family member's truck.

## 2019-10-10 NOTE — Telephone Encounter (Signed)
Please check on her on Monday 

## 2019-10-10 NOTE — Telephone Encounter (Signed)
Reviewed before and entirely agree.

## 2019-10-11 ENCOUNTER — Ambulatory Visit (INDEPENDENT_AMBULATORY_CARE_PROVIDER_SITE_OTHER): Payer: Medicare Other | Admitting: Internal Medicine

## 2019-10-11 ENCOUNTER — Telehealth: Payer: Self-pay

## 2019-10-11 ENCOUNTER — Other Ambulatory Visit: Payer: Self-pay

## 2019-10-11 ENCOUNTER — Encounter: Payer: Self-pay | Admitting: Internal Medicine

## 2019-10-11 VITALS — BP 140/82 | HR 73 | Temp 98.2°F | Ht 63.0 in | Wt 248.0 lb

## 2019-10-11 DIAGNOSIS — R0600 Dyspnea, unspecified: Secondary | ICD-10-CM

## 2019-10-11 DIAGNOSIS — I872 Venous insufficiency (chronic) (peripheral): Secondary | ICD-10-CM

## 2019-10-11 DIAGNOSIS — R0609 Other forms of dyspnea: Secondary | ICD-10-CM | POA: Insufficient documentation

## 2019-10-11 LAB — URINE CULTURE: Culture: >=100000 — AB

## 2019-10-11 MED ORDER — FUROSEMIDE 20 MG PO TABS: 20.0000 mg | ORAL_TABLET | Freq: Every day | ORAL | 1 refills | Status: DC

## 2019-10-11 NOTE — Assessment & Plan Note (Signed)
Will continue furosemide daily for now Might consider changing away from the amlodipine (though she has been on that for years)

## 2019-10-11 NOTE — Progress Notes (Signed)
Subjective:    Patient ID: Autumn Price, female    DOB: 11-15-1941, 78 y.o.   MRN: [id number]  HPI Here for ER follow up  Was here 5 days ago Both legs very swollen--right one was blue Ultrasound showed no blood clots Then went to ER 2 days later after going to ER (sent from urgent care) Extensive testing (CT venogram) was benign Got 3 furosemide ----hasn't been voiding more (but weight is down some) All records reviewed  Has had DOE (like bringing out trash can) for 2 weeks or so Then the leg swelling worsened last week Better since the furosemide  Sleeps in bed--flat. No PND No chest pain---but gets some uncomfortable feeling in epigastrium Occasionally notes heart beating fast (especially with activity) No dizziness or syncope  Current Outpatient Medications on File Prior to Visit  Medication Sig Dispense Refill  . acetaminophen (TYLENOL) 500 MG tablet Take 500 mg by mouth every 6 (six) hours as needed for mild pain or moderate pain.     Marland Kitchen amLODipine (NORVASC) 5 MG tablet TAKE ONE TABLET BY MOUTH ONCE DAILY 30 tablet 11  . furosemide (LASIX) 20 MG tablet Take 1 tablet (20 mg total) by mouth daily for 3 days. 3 tablet 0  . ketoconazole (NIZORAL) 2 % cream Apply 1 application topically 2 (two) times daily as needed for irritation. 30 g 3  . sertraline (ZOLOFT) 50 MG tablet TAKE 1 AND 1/2 TABLETS BY MOUTH ONCE DAILY 45 tablet 11  . traMADol (ULTRAM) 50 MG tablet TAKE TWO TABLETS BY MOUTH TWICE DAILY AS NEEDED for MODERATE PAIN 120 tablet 0   No current facility-administered medications on file prior to visit.     No Known Allergies  Past Medical History:  Diagnosis Date  . Depression   . Hypertension   . Osteoarthritis   . Urge incontinence   . Venous insufficiency     Past Surgical History:  Procedure Laterality Date  . APPENDECTOMY  1966  . DILATION AND CURETTAGE OF UTERUS  01/27/14   benign  . JOINT REPLACEMENT     right knee  . TOTAL KNEE ARTHROPLASTY  Right 03/20/2018   Procedure: RIGHT TOTAL KNEE ARTHROPLASTY;  Surgeon: Eugenia Mcalpine, MD;  Location: [facility];  Service: Orthopedics;  Laterality: Right;  . TOTAL KNEE ARTHROPLASTY Left 09/11/2018   Procedure: LEFT TOTAL KNEE ARTHROPLASTY;  Surgeon: Eugenia Mcalpine, MD;  Location: [facility];  Service: Orthopedics;  Laterality: Left;  . TUBAL LIGATION  1966    Family History  Problem Relation Age of Onset  . Heart disease Father        died age 23- MI  . Alcohol abuse Brother   . Heart disease Brother   . Heart disease Sister   . Breast cancer Neg Hx     Social History   Socioeconomic History  . Marital status: Divorced    Spouse name: Not on file  . Number of children: 3  . Years of education: Not on file  . Highest education level: Not on file  Occupational History  . 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 Financial resource strain: Not on file  . Food insecurity    Worry: Not on file    Inability: Not on file  . Transportation needs    Medical: Not on file    Non-medical: Not on file  Tobacco Use  . Smoking status: Never Smoker  . Smokeless tobacco: Never  Used  Substance and Sexual Activity  . Alcohol use: No  . Drug use: No  . Sexual activity: Not on file  Lifestyle  . Physical activity    Days per week: Not on file    Minutes per session: Not on file  . Stress: Not on file  Relationships  . Social Herbalist on phone: Not on file    Gets together: Not on file    Attends religious service: Not on file    Active member of club or organization: Not on file    Attends meetings of clubs or organizations: Not on file    Relationship status: Not on file  . Intimate partner violence    Fear of current or ex partner: Not on file    Emotionally abused: Not on file    Physically abused: Not on file    Forced sexual activity: Not on file  Other Topics Concern  . Not on file  Social History Narrative   Has  living will   Designated son Alcide Goodness and other children to make health care decisions for her   Would accept CPR/resuscitation   Hasn't considered tube feedings--would leave to son   Review of Systems No cough or fever Appetite is okay Amoxil twice from dentist due to abscess    Objective:   Physical Exam  Constitutional: No distress.  Neck: No thyromegaly present.  Cardiovascular: Normal rate, regular rhythm and normal heart sounds. Exam reveals no gallop.  No murmur heard. Frequent extra beats  Respiratory: Effort normal and breath sounds normal. No respiratory distress. She has no wheezes. She has no rales.  Musculoskeletal:     Comments: 2+ non pitting calf edema. Feet okay  Lymphadenopathy:    She has no cervical adenopathy.           Assessment & Plan:

## 2019-10-11 NOTE — Telephone Encounter (Signed)
No evidence of any infection Okay to come to the office with usual precautions

## 2019-10-11 NOTE — Patient Instructions (Signed)
Please continue the furosemide 20mg  daily till your follow up. You should hear from the cardiology office about setting up the echocardiogram.

## 2019-10-11 NOTE — Telephone Encounter (Signed)
[place] Night - Client Nonclinical Telephone Record AccessNurse Client [facility] Night - Client Client Site [place] Physician [name] - MD Contact Type Call Who Is Calling Patient / Member / Family / Caregiver Caller Name [place] Phone Number [phone] Patient Name Autumn Price Patient DOB March 30, 1941 Call Type Message Only Information Provided Reason for Call Request for General Office Information Initial Comment Caller states Friday night [facility] sent [place] [facility]; was adv to see pcp and adv needs echocardiogram. Additional Comment Called mainline/appts; Call Closed By: Jerrye Beavers Transaction Date/Time: 10/11/2019 8:00:43 AM (ET)

## 2019-10-11 NOTE — Telephone Encounter (Signed)
Pt went to ER and is on our schedule for today

## 2019-10-11 NOTE — Telephone Encounter (Signed)
Pt has ED FU today from being seen on 10/08/19 for FU of swelling in lower legs, SOB upon exertion,pt has been taking Lasix 20 mg daily since seen in ED on 10/08/19 without a lot of improvement in leg swelling and SOB upon exertion. Pt was scheduled today at 2 PM and is it OK for pt to come to office with SOB? If pt does not get phone call pt will be at office at 2 PM. ED precautions given and pt voiced understanding.

## 2019-10-11 NOTE — Assessment & Plan Note (Signed)
As well as edema Mildly elevated BNP CT doesn't show pulmonary vascular congestion--just slight atelectasis Edema prompted vascular evaluation--negative CHF is still a concern Will check echo Coronary ischemia is less likely but still possible----if DOE is gone with more diuresis, and echo still shows normal EF ---no more testing. If DOE persists, to cardiology

## 2019-10-12 NOTE — Progress Notes (Addendum)
Brief Pharmacy Note  78 y/o patient with history of urge incontinence, HTN, chronic venous insufficiency presented to [facility] ED 10/23 c/o bilateral leg swelling. UA significant for rare bacteria, WBC 11-20 and was c/f UTI. Patient deferred antibiotic therapy at that time due to patient reported recent antibiotics for abscess. Denied dysuria at that time but did report recent symptoms a few days prior. There was documentation of urinary incontinence during that ED visit with patient stating "I just can't hold it".   Urine culture from 10/23 resulted >100k colonies/mL E coli (ampicillin resistant). Patient has NKDA. CrCl calculated based on 10/23 Scr 70-80 mL/min. Spoke with EDP and received verbal prescription for nitrofurantoin (Macrobid) 100 mg BID x 7 days. Attempted to call patient but received answering machine. Left VM for patient to return call to main pharmacy. Will attempt to reach patient again tomorrow if no callback received before that time.  Update: Patient returned phone call. She was informed of her urine culture results. She denies urinary symptoms, back pain, fevers. Appears to have asymptomatic bacteriuria presently. Reports she will discuss results with her primary care provider at up-coming visit. Will not pursue antibiotics at this time.   [place] Resident 12 October 2019

## 2019-10-13 ENCOUNTER — Other Ambulatory Visit: Payer: Self-pay

## 2019-10-13 ENCOUNTER — Ambulatory Visit (HOSPITAL_COMMUNITY): Payer: Medicare Other | Attending: Cardiovascular Disease

## 2019-10-13 DIAGNOSIS — R0600 Dyspnea, unspecified: Secondary | ICD-10-CM | POA: Diagnosis not present

## 2019-10-13 DIAGNOSIS — R0609 Other forms of dyspnea: Secondary | ICD-10-CM

## 2019-10-15 ENCOUNTER — Telehealth: Payer: Self-pay

## 2019-10-15 NOTE — Telephone Encounter (Signed)
Spoke to pt. See results.

## 2019-10-15 NOTE — Telephone Encounter (Signed)
Patient called requesting results on echocardiogram that was done on 10/13/19.

## 2019-10-22 ENCOUNTER — Other Ambulatory Visit: Payer: Self-pay

## 2019-10-22 MED ORDER — TRAMADOL HCL 50 MG PO TABS: ORAL_TABLET | ORAL | 0 refills | Status: DC

## 2019-10-22 NOTE — Telephone Encounter (Signed)
Last filled 09-24-19 #120 Last OV 10-11-19 Next OV 10-25-19 [facility]

## 2019-10-25 ENCOUNTER — Other Ambulatory Visit: Payer: Self-pay

## 2019-10-25 ENCOUNTER — Ambulatory Visit (INDEPENDENT_AMBULATORY_CARE_PROVIDER_SITE_OTHER): Payer: Medicare Other | Admitting: Internal Medicine

## 2019-10-25 ENCOUNTER — Encounter: Payer: Self-pay | Admitting: Internal Medicine

## 2019-10-25 DIAGNOSIS — I5032 Chronic diastolic (congestive) heart failure: Secondary | ICD-10-CM

## 2019-10-25 NOTE — Assessment & Plan Note (Signed)
Not clear cut but with increased edema and DOE and elevated BNP---may be mild CHF Echo showed normal EF--no major valvular issues Doing fine with low dose fursomide Will recheck labs at upcoming wellness visit  Discussed going off the amlodipine and switching to ACEI or ARB/HCTZ. Her legs are now back at her baseline for many years, so will hold off

## 2019-10-25 NOTE — Progress Notes (Signed)
Subjective:    Patient ID: Autumn Price, female    DOB: 1941/11/07, 78 y.o.   MRN: [id number]  HPI  Here for follow up of edema and concern for CHF   Generally feels okay  Swelling is better Still gets DOE with exertion---about the same No chest pain  No palpitations No dizziness  Current Outpatient Medications on File Prior to Visit  Medication Sig Dispense Refill  . acetaminophen (TYLENOL) 500 MG tablet Take 500 mg by mouth every 6 (six) hours as needed for mild pain or moderate pain.     Marland Kitchen amLODipine (NORVASC) 5 MG tablet TAKE ONE TABLET BY MOUTH ONCE DAILY 30 tablet 11  . furosemide (LASIX) 20 MG tablet Take 1 tablet (20 mg total) by mouth daily. 30 tablet 1  . ketoconazole (NIZORAL) 2 % cream Apply 1 application topically 2 (two) times daily as needed for irritation. 30 g 3  . sertraline (ZOLOFT) 50 MG tablet TAKE 1 AND 1/2 TABLETS BY MOUTH ONCE DAILY 45 tablet 11  . traMADol (ULTRAM) 50 MG tablet TAKE TWO TABLETS BY MOUTH TWICE DAILY AS NEEDED for MODERATE PAIN 120 tablet 0   No current facility-administered medications on file prior to visit.     No Known Allergies  Past Medical History:  Diagnosis Date  . Depression   . Hypertension   . Osteoarthritis   . Urge incontinence   . Venous insufficiency     Past Surgical History:  Procedure Laterality Date  . APPENDECTOMY  1966  . DILATION AND CURETTAGE OF UTERUS  01/27/14   benign  . JOINT REPLACEMENT     right knee  . TOTAL KNEE ARTHROPLASTY Right 03/20/2018   Procedure: RIGHT TOTAL KNEE ARTHROPLASTY;  Surgeon: Eugenia Mcalpine, MD;  Location: [facility];  Service: Orthopedics;  Laterality: Right;  . TOTAL KNEE ARTHROPLASTY Left 09/11/2018   Procedure: LEFT TOTAL KNEE ARTHROPLASTY;  Surgeon: Eugenia Mcalpine, MD;  Location: [facility];  Service: Orthopedics;  Laterality: Left;  . TUBAL LIGATION  1966    Family History  Problem Relation Age of Onset  . Heart disease Father        died age 67- MI  . Alcohol abuse  Brother   . Heart disease Brother   . Heart disease Sister   . Breast cancer Neg Hx     Social History   Socioeconomic History  . Marital status: Divorced    Spouse name: Not on file  . Number of children: 3  . Years of education: Not on file  . Highest education level: Not on file  Occupational History  . 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 Financial resource strain: Not on file  . Food insecurity    Worry: Not on file    Inability: Not on file  . Transportation needs    Medical: Not on file    Non-medical: Not on file  Tobacco Use  . Smoking status: Never Smoker  . Smokeless tobacco: Never Used  Substance and Sexual Activity  . Alcohol use: No  . Drug use: No  . Sexual activity: Not on file  Lifestyle  . Physical activity    Days per week: Not on file    Minutes per session: Not on file  . Stress: Not on file  Relationships  . Social Musician on phone: Not on file    Gets together: Not on file  Attends religious service: Not on file    Active member of club or organization: Not on file    Attends meetings of clubs or organizations: Not on file    Relationship status: Not on file  . Intimate partner violence    Fear of current or ex partner: Not on file    Emotionally abused: Not on file    Physically abused: Not on file    Forced sexual activity: Not on file  Other Topics Concern  . Not on file  Social History Narrative   Has living will   Designated son Alcide Goodness and other children to make health care decisions for her   Would accept CPR/resuscitation   Hasn't considered tube feedings--would leave to son   Review of Systems Appetite is fine Sleeps in bed on one pillow--no PND Takes a while to get to sleep. Then only 4-5 hours. Occasional daytime nap Intermittent elevated BP---usually at consultant offices    Objective:   Physical Exam  Constitutional: She appears well-developed.  No distress.  Neck: No thyromegaly present.  Cardiovascular: Normal rate, regular rhythm and normal heart sounds. Exam reveals no gallop.  No murmur heard. Respiratory: Effort normal and breath sounds normal. No respiratory distress. She has no wheezes. She has no rales.  Musculoskeletal:     Comments: Thick calves but no palpable fluid Feet fairly normal  Lymphadenopathy:    She has no cervical adenopathy.           Assessment & Plan:

## 2019-11-10 ENCOUNTER — Other Ambulatory Visit: Payer: Medicare Other

## 2019-11-11 IMAGING — CR DG CHEST 2V
2 series · 2 of 2 positions shown · non-contrast
Comparison: None.

CLINICAL DATA: 76-year-old who underwent RIGHT knee arthroplasty on
03/20/2018 with home physical therapy. One at recent episode of
nausea. Generalized fatigue. Current history of hypertension.

EXAM:
CHEST - 2 VIEW

[w chest lat]
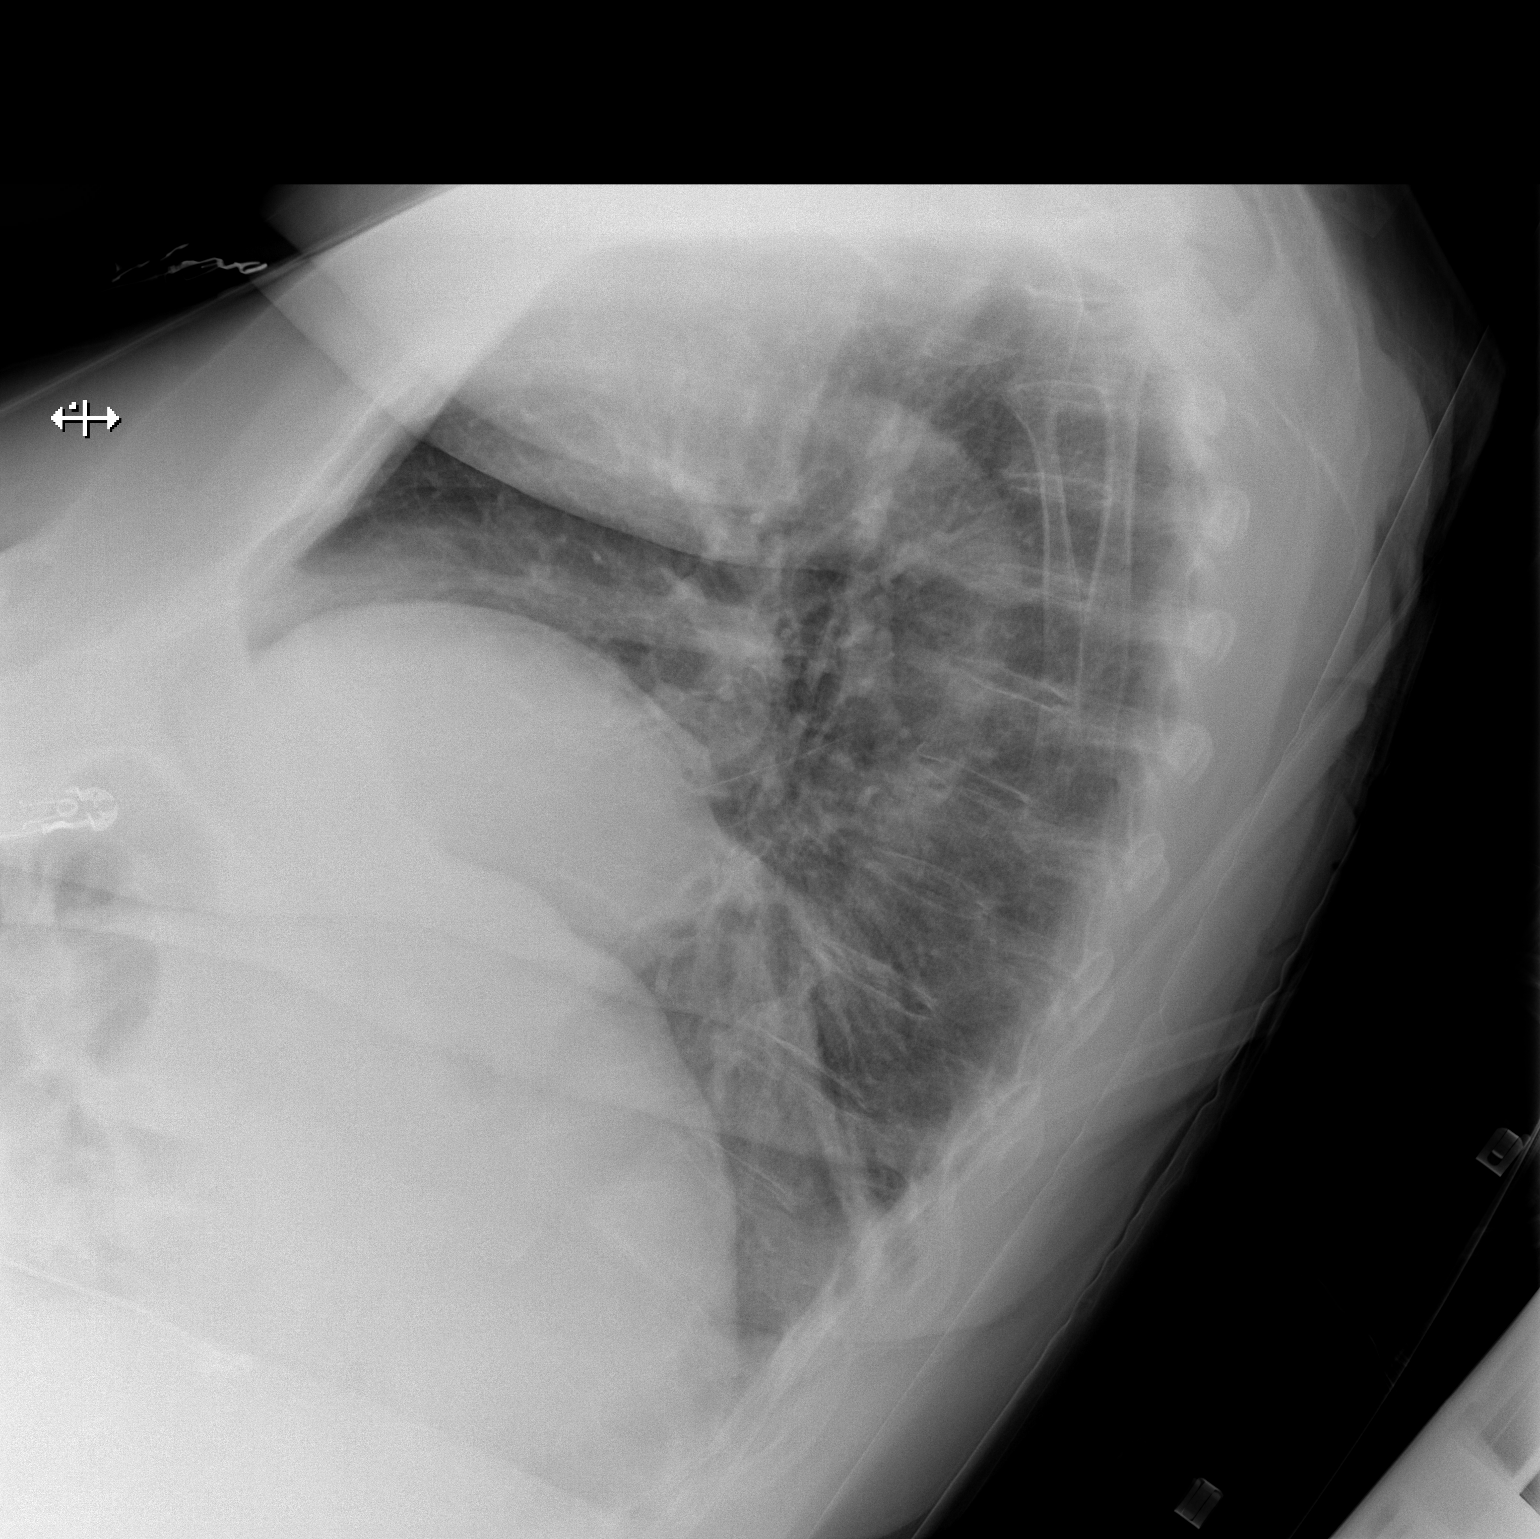

[x chest ap]
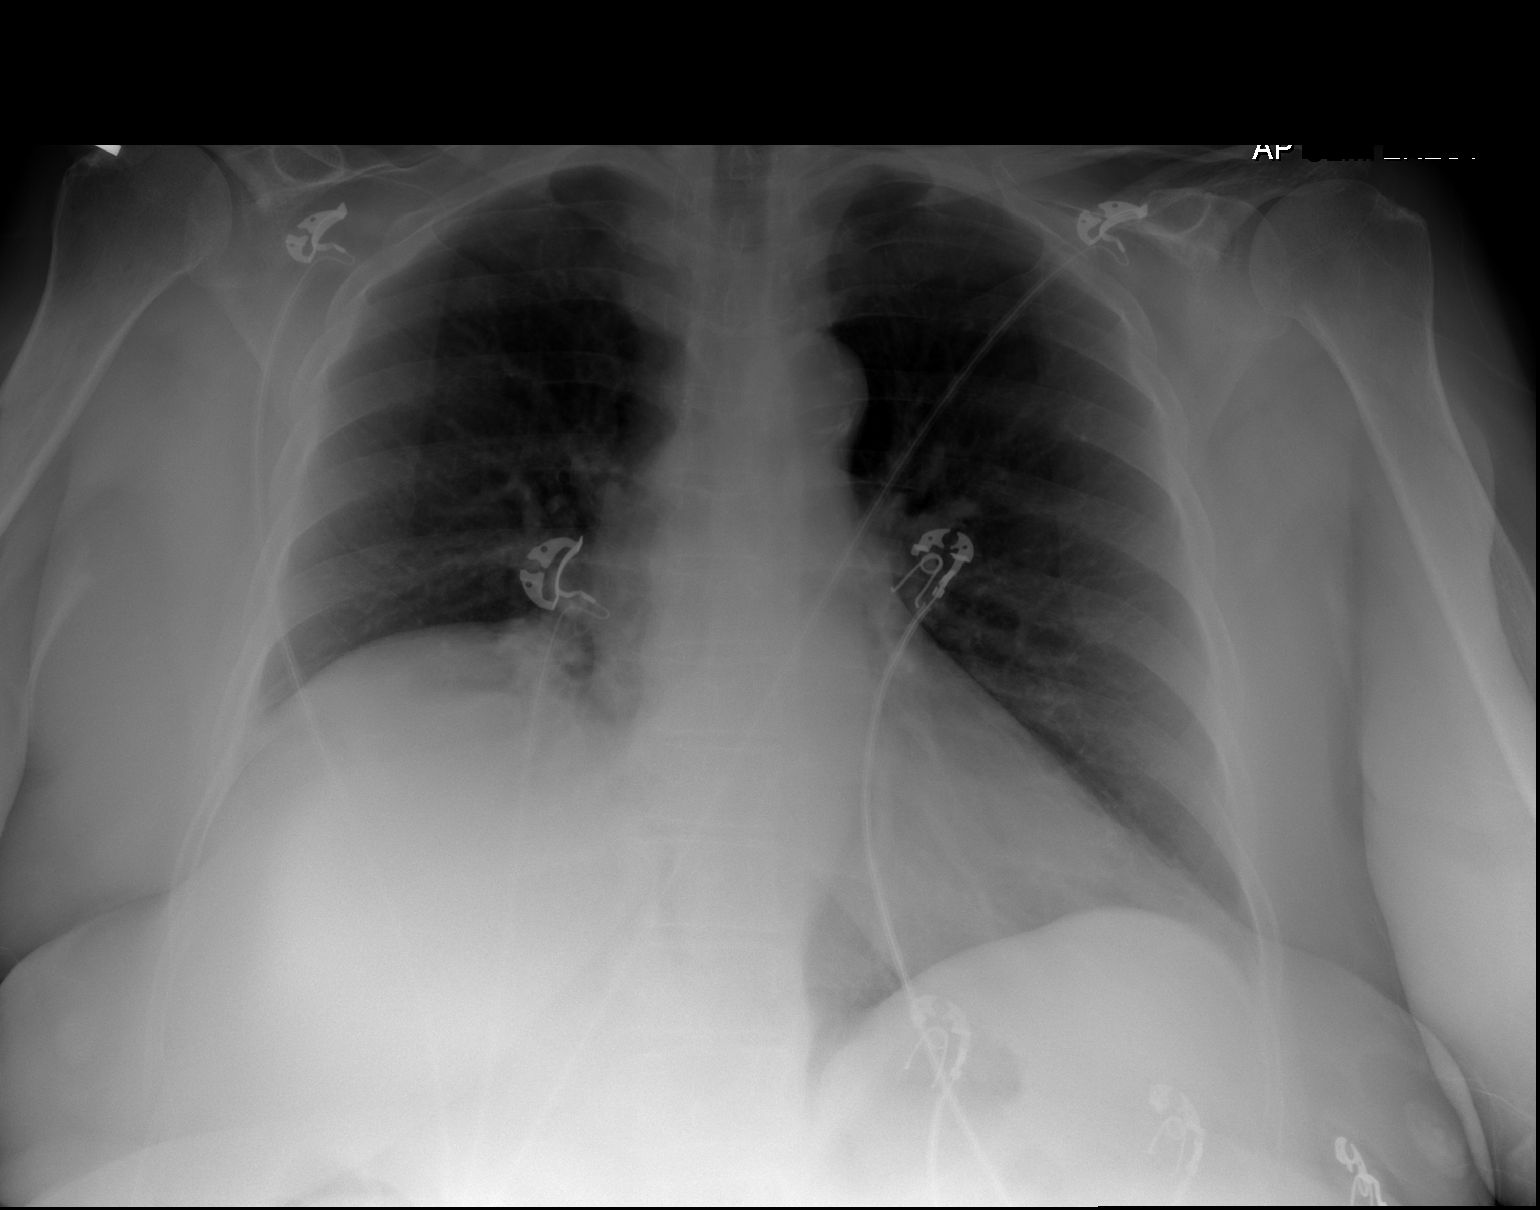

[2 of 2 positions shown; findings below may reference images not displayed]

FINDINGS: AP semi-erect and lateral images were obtained. Cardiac silhouette
normal in size. Thoracic aorta mildly atherosclerotic. Hilar and
mediastinal contours otherwise unremarkable. Marked elevation of the
RIGHT hemidiaphragm with scar and/or atelectasis involving the RIGHT
middle lobe and RIGHT lower lobe. Lungs otherwise clear. Normal
pulmonary vascularity. No pleural effusions. Visualized bony thorax
intact.
IMPRESSION: 1.  No acute cardiopulmonary disease.
2. Elevation of the RIGHT hemidiaphragm, likely chronic, with
associated scar and/or atelectasis involving the RIGHT middle lobe
and RIGHT lower lobe.
3.  Aortic Atherosclerosis ([id number]-170.0)

## 2019-11-11 IMAGING — CR DG KNEE COMPLETE 4+V*R*
4 series · 4 of 4 positions shown · non-contrast
Comparison: No prior RIGHT knee x-rays. RIGHT tibia fibula x-rays
02/09/2006 are correlated.

CLINICAL DATA: 76-year-old who underwent RIGHT total knee
arthroplasty on 03/20/2018, currently doing physical therapy at
home.

EXAM:
RIGHT KNEE - COMPLETE 4+ VIEW

[x knee lat right]
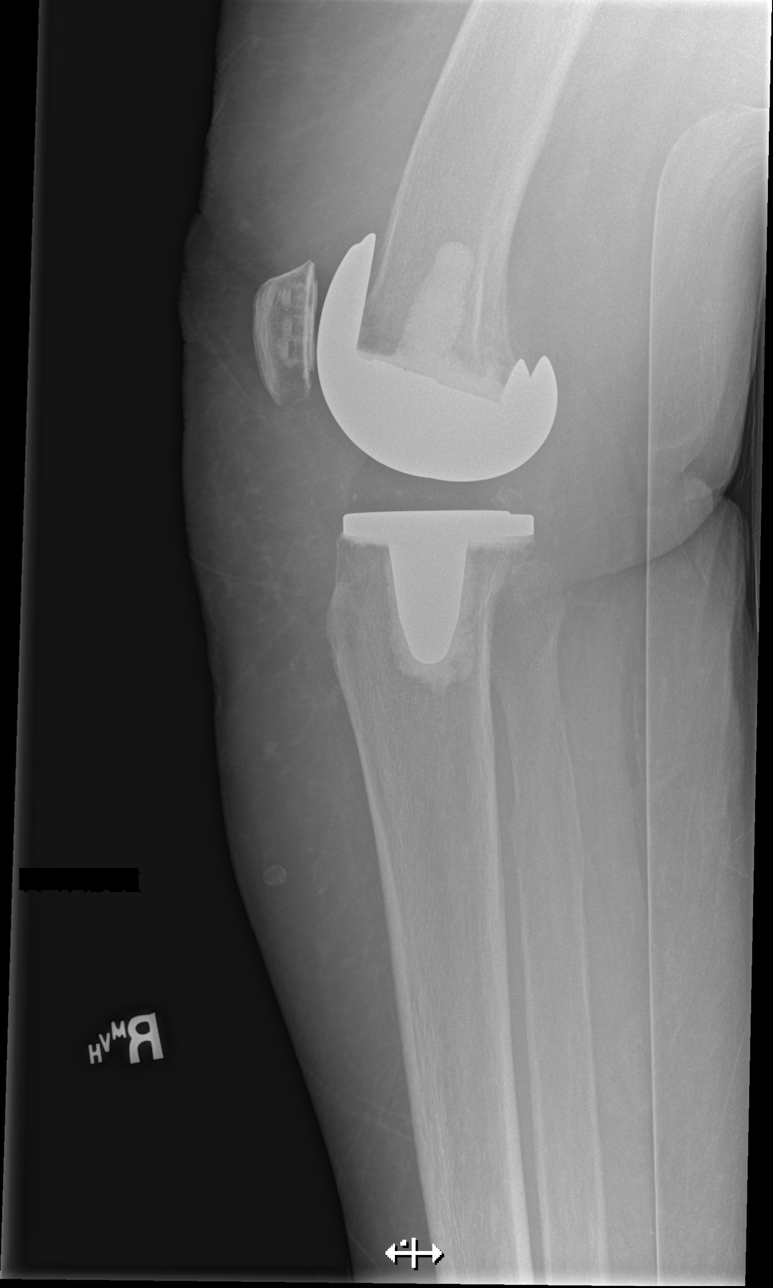

[x knee ap right]
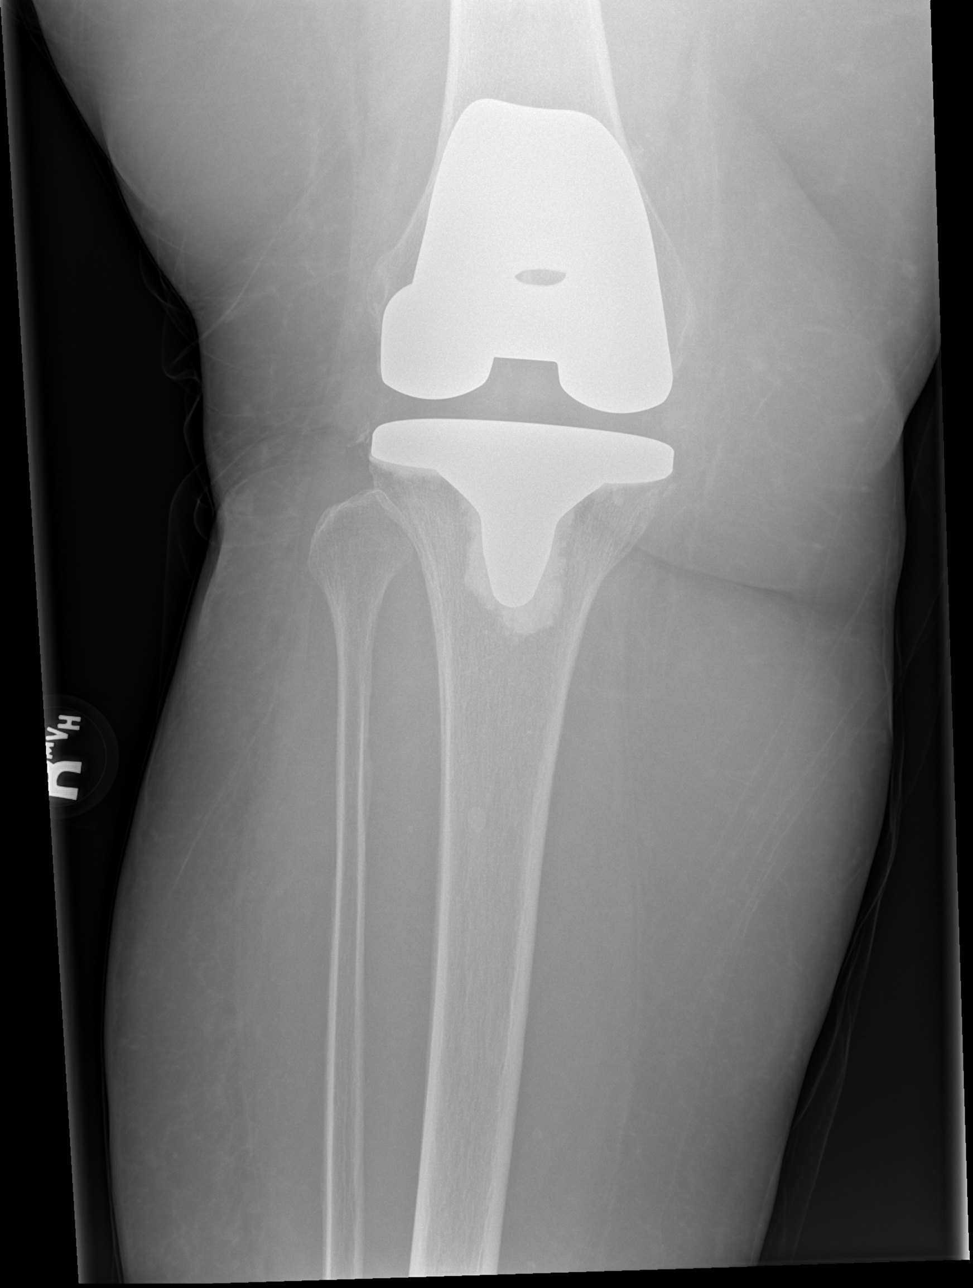

[x knee obl right (1 of 2)]
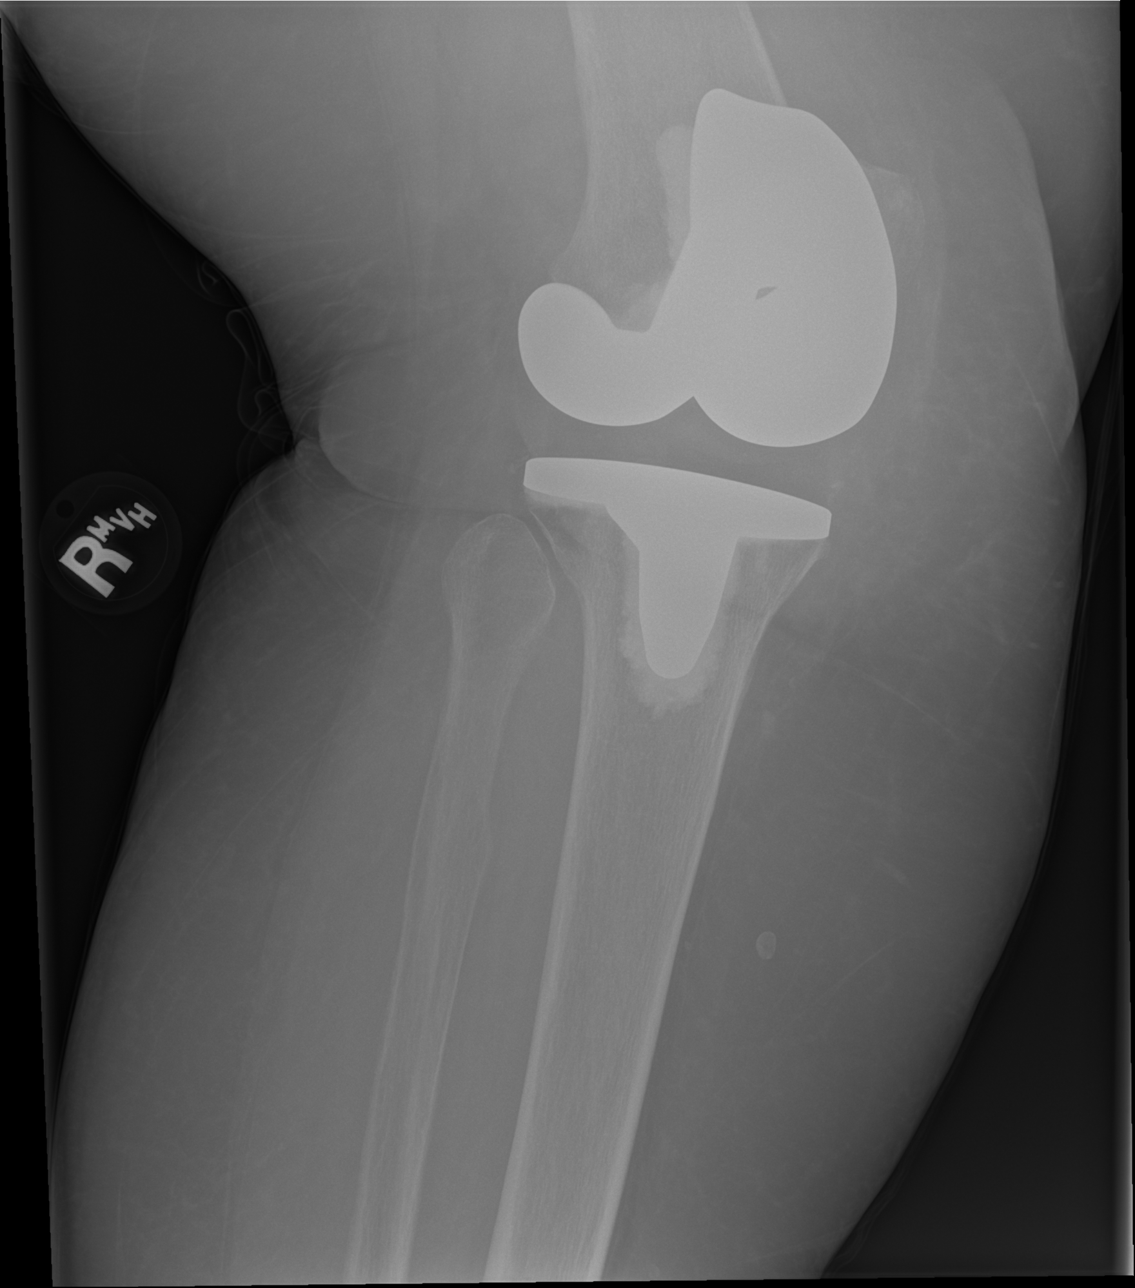

[x knee obl right (2 of 2)]
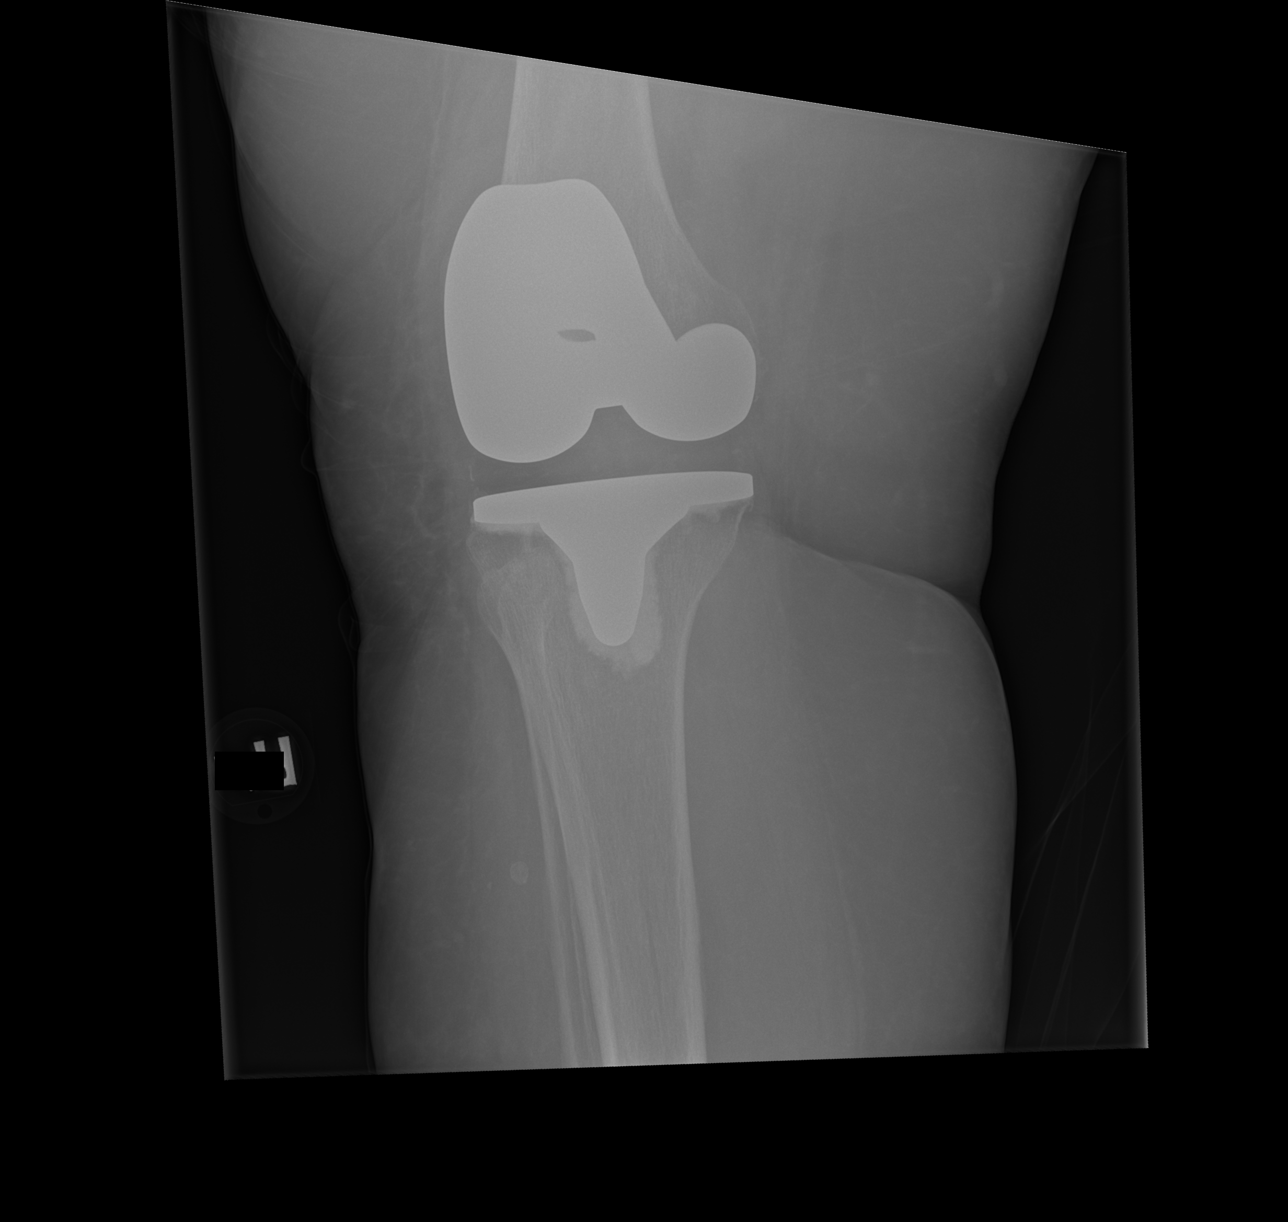

[4 of 4 positions shown; findings below may reference images not displayed]

FINDINGS: Anatomic alignment of the cemented RIGHT knee arthroplasty. No
complicating features. No evidence of acute, subacute or healed
fractures. Osseous demineralization. Phleboliths in the subcutaneous
tissues of the ANTERIOR LOWER leg.
IMPRESSION: 1. No acute osseous abnormality.
2. Anatomic alignment of the RIGHT knee arthroplasty without
complicating features.

## 2019-11-19 ENCOUNTER — Other Ambulatory Visit: Payer: Self-pay | Admitting: Internal Medicine

## 2019-11-21 ENCOUNTER — Other Ambulatory Visit: Payer: Self-pay | Admitting: Internal Medicine

## 2019-11-22 ENCOUNTER — Telehealth: Payer: Self-pay

## 2019-11-22 NOTE — Telephone Encounter (Signed)
Last filled 10-22-19 #120 Last OV 10-25-19 Next OV 12-07-19 [facility]

## 2019-11-22 NOTE — Telephone Encounter (Signed)
Topical Yeast Infection in groin area. Spoke to Autumn Price.

## 2019-11-22 NOTE — Telephone Encounter (Signed)
[name] with [facility] left v/m wanting to know where pt will be using the ketoconazole cream. I  Was not able to speak with pt and ask where she is applying ketoconazole cream .

## 2019-12-02 ENCOUNTER — Telehealth: Payer: Self-pay | Admitting: Internal Medicine

## 2019-12-02 NOTE — Telephone Encounter (Signed)
Spoke to pt. She said she feels better but has a lot of fatigue. Cough is the same as she has had for the last several months.

## 2019-12-02 NOTE — Telephone Encounter (Signed)
Sick since last Thursday 12/10, vomiting and fever on Friday was 100.2, no fever or vomiting since. Pt c/o having the Worst headache she has ever had, Increased sweating. Bad cough - dry, very little production (clear). Croupy cough. Denies SOB or wheezing, fever or chest pain Runny nose first thing in the morning, tapers off during the day - mucous is clear.  Feels tired and give out -- hardly enough energy to hold her arms up.  Pt has been quarantining since symptoms onset.  Pt has not been around anyone sick or exposed that she is aware of.  Pt notes that she did go to the grocery store on Tuesday 12/8 but she wore a mask and sanitized.   Pt has been taking OTC meds for sx's -- Immune Guard, Vit C with Zinc  Pharmacy : [facility]

## 2019-12-02 NOTE — Telephone Encounter (Signed)
Let her know this very well could be COVID---she should quarantine for at least 10 days from symptom onset and three days from any fever (with improving overall symptoms). Not much to do except supportive care (tylenol, cough syrup or drops, etc) Can schedule virtual visit tomorrow if she likes (if worsens, can go to urgent care)

## 2019-12-06 ENCOUNTER — Telehealth: Payer: Self-pay | Admitting: Internal Medicine

## 2019-12-06 NOTE — Telephone Encounter (Signed)
Patient had an annual wellness visit scheduled for tomorrow.  Patient called to find out if she could come with a cough,fatigue, and occasional  Headache. Patient hasn't had a fever for over a week.  We cancelled the appointment with Dr.Letvak for tomorrow.  Patient wants to know what she needs to do. Does she need to [facility] and if so, how long?  Patient said she's feeling a lot better than when she first got sick.

## 2019-12-06 NOTE — Telephone Encounter (Signed)
Patient given Dr.Letvak's recommendations.  Patient rescheduled AWV to 12/24/19.

## 2019-12-06 NOTE — Telephone Encounter (Signed)
She should quarantine for preferably 2 weeks----but if she is COVID tested now, and is negative, it is probably okay to go out with proper distancing and mask. Please reschedule her AMW for 2 weeks or more

## 2019-12-07 ENCOUNTER — Encounter: Payer: Medicare Other | Admitting: Internal Medicine

## 2019-12-21 ENCOUNTER — Other Ambulatory Visit: Payer: Self-pay | Admitting: Internal Medicine

## 2019-12-21 NOTE — Telephone Encounter (Signed)
Last filled 11-22-19 #120 Last OV  10-25-19 Next OV 12-24-19 [facility]

## 2019-12-21 NOTE — Telephone Encounter (Signed)
[facility] called requesting refills on the patient's  traMADol

## 2019-12-22 ENCOUNTER — Telehealth: Payer: Self-pay | Admitting: Internal Medicine

## 2019-12-22 ENCOUNTER — Other Ambulatory Visit: Payer: Self-pay | Admitting: Internal Medicine

## 2019-12-22 MED ORDER — TRAMADOL HCL 50 MG PO TABS: ORAL_TABLET | ORAL | 0 refills | Status: DC

## 2019-12-22 NOTE — Telephone Encounter (Signed)
Spoke to Autumn Price at [facility] with a verbal order. They will fax the form so it can be signed. Will send to Dr Alphonsus Sias as Lorain Childes.

## 2019-12-22 NOTE — Telephone Encounter (Signed)
Autumn Price,[facility], called.  A fax was sent for incontinence supplies x 1 month.  Autumn Price is calling to get a verbal order until she receives the paperwork back.

## 2019-12-22 NOTE — Telephone Encounter (Signed)
Spoke to pt to verified. She had been getting supplies from [facility]. They stopped doing that and you have to go through [place]. [facility] does not have what she needs so they recommended this group.

## 2019-12-23 DIAGNOSIS — N3281 Overactive bladder: Secondary | ICD-10-CM | POA: Diagnosis not present

## 2019-12-23 NOTE — Telephone Encounter (Signed)
That is fine I will know when I see the form now

## 2019-12-24 ENCOUNTER — Encounter: Payer: Self-pay | Admitting: Internal Medicine

## 2019-12-24 ENCOUNTER — Other Ambulatory Visit: Payer: Self-pay

## 2019-12-24 ENCOUNTER — Ambulatory Visit (INDEPENDENT_AMBULATORY_CARE_PROVIDER_SITE_OTHER): Payer: Medicare Other | Admitting: Internal Medicine

## 2019-12-24 VITALS — BP 140/72 | HR 69 | Temp 97.7°F | Ht 63.75 in | Wt 250.0 lb

## 2019-12-24 DIAGNOSIS — M159 Polyosteoarthritis, unspecified: Secondary | ICD-10-CM

## 2019-12-24 DIAGNOSIS — F39 Unspecified mood [affective] disorder: Secondary | ICD-10-CM

## 2019-12-24 DIAGNOSIS — I10 Essential (primary) hypertension: Secondary | ICD-10-CM | POA: Diagnosis not present

## 2019-12-24 DIAGNOSIS — Z0000 Encounter for general adult medical examination without abnormal findings: Secondary | ICD-10-CM | POA: Diagnosis not present

## 2019-12-24 DIAGNOSIS — I5032 Chronic diastolic (congestive) heart failure: Secondary | ICD-10-CM | POA: Diagnosis not present

## 2019-12-24 DIAGNOSIS — M8949 Other hypertrophic osteoarthropathy, multiple sites: Secondary | ICD-10-CM

## 2019-12-24 DIAGNOSIS — Z7189 Other specified counseling: Secondary | ICD-10-CM

## 2019-12-24 LAB — COMPREHENSIVE METABOLIC PANEL
ALT: 10 U/L (ref 0–35)
AST: 18 U/L (ref 0–37)
Albumin: 4.4 g/dL (ref 3.5–5.2)
Alkaline Phosphatase: 96 U/L (ref 39–117)
BUN: 14 mg/dL (ref 6–23)
CO2: 30 mEq/L (ref 19–32)
Calcium: 10 mg/dL (ref 8.4–10.5)
Chloride: 102 mEq/L (ref 96–112)
Creatinine, Ser: 0.79 mg/dL (ref 0.40–1.20)
GFR: 70.69 mL/min (ref >60.00)
Glucose, Bld: 104 mg/dL — ABNORMAL HIGH (ref 70–99)
Potassium: 4.2 mEq/L (ref 3.5–5.1)
Sodium: 140 mEq/L (ref 135–145)
Total Bilirubin: 0.9 mg/dL (ref 0.2–1.2)
Total Protein: 7.6 g/dL (ref 6.0–8.3)

## 2019-12-24 LAB — T4, FREE: Free T4: 0.93 ng/dL (ref 0.60–1.60)

## 2019-12-24 LAB — CBC
HCT: 42.6 % (ref 36.0–46.0)
Hemoglobin: 13.8 g/dL (ref 12.0–15.0)
MCHC: 32.4 g/dL (ref 30.0–36.0)
MCV: 87.2 fl (ref 78.0–100.0)
Platelets: 119 10*3/uL — ABNORMAL LOW (ref 150.0–400.0)
RBC: 4.89 Mil/uL (ref 3.87–5.11)
RDW: 14.4 % (ref 11.5–15.5)
WBC: 4.8 10*3/uL (ref 4.0–10.5)

## 2019-12-24 NOTE — Progress Notes (Signed)
Hearing Screening   Method: Audiometry   125Hz  250Hz  500Hz  1000Hz  2000Hz  3000Hz  4000Hz  6000Hz  8000Hz   Right ear:   40 40 40  0    Left ear:   40 40 0  0      Visual Acuity Screening   Right eye Left eye Both eyes  Without correction:     With correction: 20/25 20/25 20/20

## 2019-12-24 NOTE — Assessment & Plan Note (Signed)
I have personally reviewed the Medicare Annual Wellness questionnaire and have noted 1. The patient's medical and social history 2. Their use of alcohol, tobacco or illicit drugs 3. Their current medications and supplements 4. The patient's functional ability including ADL's, fall risks, home safety risks and hearing or visual             impairment. 5. Diet and physical activities 6. Evidence for depression or mood disorders  The patients weight, height, BMI and visual acuity have been recorded in the chart I have made referrals, counseling and provided education to the patient based review of the above and I have provided the pt with a written personalized care plan for preventive services.  I have provided you with a copy of your personalized plan for preventive services. Please take the time to review along with your updated medication list.  Needs 2nd shingrix Annual flu vaccine COVID vaccine as soon as possible Discussed cancer screening--she wishes to stop this Discussed proper eating and restarting the walking

## 2019-12-24 NOTE — Progress Notes (Signed)
Subjective:    Patient ID: Autumn Price, female    DOB: 1941/05/04, 79 y.o.   MRN: [id number]  HPI Here for Medicare wellness visit and follow up of chronic health conditions  This visit occurred during the SARS-CoV-2 public health emergency.  Safety protocols were in place, including screening questions prior to the visit, additional usage of staff PPE, and extensive cleaning of exam room while observing appropriate contact time as indicated for disinfecting solutions.   Reviewed form and advanced directives Reviewed other doctors No tobacco or alcohol Not walking since illness-but fairly consistent before that Stumbled once--but didn't go all the way down Vision is okay---needs appt (it has been 3 years) Hearing is fine No depression or anhedonia Independent with all instrumental ADLs Memory seems fine  Feels "no energy" Worse since illness last month---bad headache and then chills and sweats Finally did get better but not all the way Some cough--croupy  Breathing has been good Edema has not returned on the diuretic  Weight is still up Discussed proper eating----doesn't follow a specific plan BMI still over 40  Current Outpatient Medications on File Prior to Visit  Medication Sig Dispense Refill  . acetaminophen (TYLENOL) 500 MG tablet Take 500 mg by mouth every 6 (six) hours as needed for mild pain or moderate pain.     Marland Kitchen amLODipine (NORVASC) 5 MG tablet TAKE ONE TABLET BY MOUTH ONCE DAILY 30 tablet 11  . furosemide (LASIX) 20 MG tablet TAKE ONE TABLET BY MOUTH ONCE DAILY 30 tablet 1  . ketoconazole (NIZORAL) 2 % cream APPLY 1 APPLICATION TOPICALLY 2 TIMES A DAY AS NEEDED FOR IRRITATION 30 g 3  . sertraline (ZOLOFT) 50 MG tablet TAKE 1 AND 1/2 TABLETS BY MOUTH ONCE DAILY 45 tablet 11  . traMADol (ULTRAM) 50 MG tablet TAKE TWO TABLETS BY MOUTH TWICE DAILY AS NEEDED MODERATE PAIN 120 tablet 0   No current facility-administered medications on file prior to visit.    No  Known Allergies  Past Medical History:  Diagnosis Date  . Depression   . Hypertension   . Osteoarthritis   . Urge incontinence   . Venous insufficiency     Past Surgical History:  Procedure Laterality Date  . APPENDECTOMY  1966  . DILATION AND CURETTAGE OF UTERUS  01/27/14   benign  . JOINT REPLACEMENT     right knee  . TOTAL KNEE ARTHROPLASTY Right 03/20/2018   Procedure: RIGHT TOTAL KNEE ARTHROPLASTY;  Surgeon: Sydnee Cabal, MD;  Location: [facility];  Service: Orthopedics;  Laterality: Right;  . TOTAL KNEE ARTHROPLASTY Left 09/11/2018   Procedure: LEFT TOTAL KNEE ARTHROPLASTY;  Surgeon: Sydnee Cabal, MD;  Location: [facility];  Service: Orthopedics;  Laterality: Left;  . TUBAL LIGATION  1966    Family History  Problem Relation Age of Onset  . Heart disease Father        died age 77- MI  . Alcohol abuse Brother   . Heart disease Brother   . Heart disease Sister   . Breast cancer Neg Hx     Social History   Socioeconomic History  . Marital status: Divorced    Spouse name: Not on file  . Number of children: 3  . Years of education: Not on file  . Highest education level: Not on file  Occupational History  . 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 Smoking status: Never Smoker  . Smokeless tobacco: Never Used  Substance and Sexual Activity  . Alcohol use: No  . Drug use: No  . Sexual activity: Not on file  Other Topics Concern  . Not on file  Social History Narrative   Has living will   Designated son Ronny Flurry and other children to make health care decisions for her   Would accept CPR/resuscitation   Hasn't considered tube feedings--would leave to son   Social Determinants of Health   Financial Resource Strain:   . Difficulty of Paying Living Expenses: Not on file  Food Insecurity:   . Worried About Programme researcher, broadcasting/film/video in the Last Year: Not on file  . Ran Out of Food in the Last Year: Not on file    Transportation Needs:   . Lack of Transportation (Medical): Not on file  . Lack of Transportation (Non-Medical): Not on file  Physical Activity:   . Days of Exercise per Week: Not on file  . Minutes of Exercise per Session: Not on file  Stress:   . Feeling of Stress : Not on file  Social Connections:   . Frequency of Communication with Friends and Family: Not on file  . Frequency of Social Gatherings with Friends and Family: Not on file  . Attends Religious Services: Not on file  . Active Member of Clubs or Organizations: Not on file  . Attends Banker Meetings: Not on file  . Marital Status: Not on file  Intimate Partner Violence:   . Fear of Current or Ex-Partner: Not on file  . Emotionally Abused: Not on file  . Physically Abused: Not on file  . Sexually Abused: Not on file   Review of Systems Trouble initiating sleep-- usually 7 hours though. Some daytime tired feeling lately though Wears seat belt Needs cap No rash or suspicious skin lesions No sig joint pain other than hands now Bowels are fine--no blood Voids okay--- wears pad for mixed incontinence (worse at night). Had bed pads to protect her bed    Objective:   Physical Exam  Constitutional: She is oriented to person, place, and time. She appears well-developed. No distress.  HENT:  Mouth/Throat: Oropharynx is clear and moist. No oropharyngeal exudate.  Neck: No thyromegaly present.  Cardiovascular: Normal rate, regular rhythm, normal heart sounds and intact distal pulses. Exam reveals no gallop.  No murmur heard. Respiratory: Effort normal and breath sounds normal. No respiratory distress. She has no wheezes. She has no rales.  GI: Soft. There is no abdominal tenderness.  Musculoskeletal:        General: No tenderness.     Comments: Large calves but no pitting  Lymphadenopathy:    She has no cervical adenopathy.  Neurological: She is alert and oriented to person, place, and time.   President---"Trump, Obama, Bush" 100-93- "I don't do that" D-l-r-o-w Recall 3/3  Skin:  Mild stasis changes and granulating traumatic ulcer left calf  Psychiatric: She has a normal mood and affect. Her behavior is normal.           Assessment & Plan:

## 2019-12-24 NOTE — Assessment & Plan Note (Signed)
BMI still over 40 Discussed healthy eating Consider Weight watchers or comparable program

## 2019-12-24 NOTE — Patient Instructions (Signed)
DASH Eating Plan DASH stands for "Dietary Approaches to Stop Hypertension." The DASH eating plan is a healthy eating plan that has been shown to reduce high blood pressure (hypertension). It may also reduce your risk for type 2 diabetes, heart disease, and stroke. The DASH eating plan may also help with weight loss. What are tips for following this plan?  General guidelines  Avoid eating more than 2,300 mg (milligrams) of salt (sodium) a day. If you have hypertension, you may need to reduce your sodium intake to 1,500 mg a day.  Limit alcohol intake to no more than 1 drink a day for nonpregnant women and 2 drinks a day for men. One drink equals 12 oz of beer, 5 oz of wine, or 1 oz of hard liquor.  Work with your health care provider to maintain a healthy body weight or to lose weight. Ask what an ideal weight is for you.  Get at least 30 minutes of exercise that causes your heart to beat faster (aerobic exercise) most days of the week. Activities may include walking, swimming, or biking.  Work with your health care provider or diet and nutrition specialist (dietitian) to adjust your eating plan to your individual calorie needs. Reading food labels   Check food labels for the amount of sodium per serving. Choose foods with less than 5 percent of the Daily Value of sodium. Generally, foods with less than 300 mg of sodium per serving fit into this eating plan.  To find whole grains, look for the word "whole" as the first word in the ingredient list. Shopping  Buy products labeled as "low-sodium" or "no salt added."  Buy fresh foods. Avoid canned foods and premade or frozen meals. Cooking  Avoid adding salt when cooking. Use salt-free seasonings or herbs instead of table salt or sea salt. Check with your health care provider or pharmacist before using salt substitutes.  Do not fry foods. Cook foods using healthy methods such as baking, boiling, grilling, and broiling instead.  Cook with  heart-healthy oils, such as olive, canola, soybean, or sunflower oil. Meal planning  Eat a balanced diet that includes: ? 5 or more servings of fruits and vegetables each day. At each meal, try to fill half of your plate with fruits and vegetables. ? Up to 6-8 servings of whole grains each day. ? Less than 6 oz of lean meat, poultry, or fish each day. A 3-oz serving of meat is about the same size as a deck of cards. One egg equals 1 oz. ? 2 servings of low-fat dairy each day. ? A serving of nuts, seeds, or beans 5 times each week. ? Heart-healthy fats. Healthy fats called Omega-3 fatty acids are found in foods such as flaxseeds and coldwater fish, like sardines, salmon, and mackerel.  Limit how much you eat of the following: ? Canned or prepackaged foods. ? Food that is high in trans fat, such as fried foods. ? Food that is high in saturated fat, such as fatty meat. ? Sweets, desserts, sugary drinks, and other foods with added sugar. ? Full-fat dairy products.  Do not salt foods before eating.  Try to eat at least 2 vegetarian meals each week.  Eat more home-cooked food and less restaurant, buffet, and fast food.  When eating at a restaurant, ask that your food be prepared with less salt or no salt, if possible. What foods are recommended? The items listed may not be a complete list. Talk with your dietitian about   what dietary choices are best for you. Grains Whole-grain or whole-wheat bread. Whole-grain or whole-wheat pasta. Brown rice. Oatmeal. Quinoa. Bulgur. Whole-grain and low-sodium cereals. Pita bread. Low-fat, low-sodium crackers. Whole-wheat flour tortillas. Vegetables Fresh or frozen vegetables (raw, steamed, roasted, or grilled). Low-sodium or reduced-sodium tomato and vegetable juice. Low-sodium or reduced-sodium tomato sauce and tomato paste. Low-sodium or reduced-sodium canned vegetables. Fruits All fresh, dried, or frozen fruit. Canned fruit in natural juice (without  added sugar). Meat and other protein foods Skinless chicken or turkey. Ground chicken or turkey. Pork with fat trimmed off. Fish and seafood. Egg whites. Dried beans, peas, or lentils. Unsalted nuts, nut butters, and seeds. Unsalted canned beans. Lean cuts of beef with fat trimmed off. Low-sodium, lean deli meat. Dairy Low-fat (1%) or fat-free (skim) milk. Fat-free, low-fat, or reduced-fat cheeses. Nonfat, low-sodium ricotta or cottage cheese. Low-fat or nonfat yogurt. Low-fat, low-sodium cheese. Fats and oils Soft margarine without trans fats. Vegetable oil. Low-fat, reduced-fat, or light mayonnaise and salad dressings (reduced-sodium). Canola, safflower, olive, soybean, and sunflower oils. Avocado. Seasoning and other foods Herbs. Spices. Seasoning mixes without salt. Unsalted popcorn and pretzels. Fat-free sweets. What foods are not recommended? The items listed may not be a complete list. Talk with your dietitian about what dietary choices are best for you. Grains Baked goods made with fat, such as croissants, muffins, or some breads. Dry pasta or rice meal packs. Vegetables Creamed or fried vegetables. Vegetables in a cheese sauce. Regular canned vegetables (not low-sodium or reduced-sodium). Regular canned tomato sauce and paste (not low-sodium or reduced-sodium). Regular tomato and vegetable juice (not low-sodium or reduced-sodium). Pickles. Olives. Fruits Canned fruit in a light or heavy syrup. Fried fruit. Fruit in cream or butter sauce. Meat and other protein foods Fatty cuts of meat. Ribs. Fried meat. Bacon. Sausage. Bologna and other processed lunch meats. Salami. Fatback. Hotdogs. Bratwurst. Salted nuts and seeds. Canned beans with added salt. Canned or smoked fish. Whole eggs or egg yolks. Chicken or turkey with skin. Dairy Whole or 2% milk, cream, and half-and-half. Whole or full-fat cream cheese. Whole-fat or sweetened yogurt. Full-fat cheese. Nondairy creamers. Whipped toppings.  Processed cheese and cheese spreads. Fats and oils Butter. Stick margarine. Lard. Shortening. Ghee. Bacon fat. Tropical oils, such as coconut, palm kernel, or palm oil. Seasoning and other foods Salted popcorn and pretzels. Onion salt, garlic salt, seasoned salt, table salt, and sea salt. Worcestershire sauce. Tartar sauce. Barbecue sauce. Teriyaki sauce. Soy sauce, including reduced-sodium. Steak sauce. Canned and packaged gravies. Fish sauce. Oyster sauce. Cocktail sauce. Horseradish that you find on the shelf. Ketchup. Mustard. Meat flavorings and tenderizers. Bouillon cubes. Hot sauce and Tabasco sauce. Premade or packaged marinades. Premade or packaged taco seasonings. Relishes. Regular salad dressings. Where to find more information:  National Heart, Lung, and Blood Institute: www.nhlbi.nih.gov  American Heart Association: www.heart.org Summary  The DASH eating plan is a healthy eating plan that has been shown to reduce high blood pressure (hypertension). It may also reduce your risk for type 2 diabetes, heart disease, and stroke.  With the DASH eating plan, you should limit salt (sodium) intake to 2,300 mg a day. If you have hypertension, you may need to reduce your sodium intake to 1,500 mg a day.  When on the DASH eating plan, aim to eat more fresh fruits and vegetables, whole grains, lean proteins, low-fat dairy, and heart-healthy fats.  Work with your health care provider or diet and nutrition specialist (dietitian) to adjust your eating plan to your   individual calorie needs. This information is not intended to replace advice given to you by your health care provider. Make sure you discuss any questions you have with your health care provider. Document Revised: 11/14/2017 Document Reviewed: 11/25/2016 Elsevier Patient Education  2020 Elsevier Inc.  

## 2019-12-24 NOTE — Assessment & Plan Note (Addendum)
Mood is good now still on sertraline (since DIL and grandson's deaths) Not anhedonic Will continue for now

## 2019-12-24 NOTE — Assessment & Plan Note (Signed)
Doing well with the furosemide Will recheck labs No functional problems

## 2019-12-24 NOTE — Assessment & Plan Note (Signed)
Knees better Uses the tylenol and prn tramadol

## 2019-12-24 NOTE — Assessment & Plan Note (Signed)
See social history 

## 2019-12-24 NOTE — Assessment & Plan Note (Signed)
BP Readings from Last 3 Encounters:  12/24/19 140/72  10/25/19 132/70  10/11/19 140/82   Reviewed the amlodipine which can cause edema Now doing well so no change

## 2019-12-27 ENCOUNTER — Encounter: Payer: Self-pay | Admitting: Lab

## 2019-12-29 ENCOUNTER — Telehealth: Payer: Self-pay

## 2019-12-29 NOTE — Telephone Encounter (Signed)
Received faxed CMN form.  Note attached states form is missing info.  Placed form in Dr. Karle Starch box.

## 2019-12-29 NOTE — Telephone Encounter (Signed)
Form revised Please resend

## 2019-12-31 ENCOUNTER — Other Ambulatory Visit: Payer: Self-pay | Admitting: Internal Medicine

## 2019-12-31 DIAGNOSIS — Z23 Encounter for immunization: Secondary | ICD-10-CM

## 2020-01-21 ENCOUNTER — Other Ambulatory Visit: Payer: Self-pay | Admitting: Internal Medicine

## 2020-01-21 NOTE — Telephone Encounter (Signed)
Last filled 12-22-19 #120 Last OV 12-24-19 Next OV 12-27-20 [facility]

## 2020-01-24 DIAGNOSIS — N3281 Overactive bladder: Secondary | ICD-10-CM | POA: Diagnosis not present

## 2020-01-25 ENCOUNTER — Telehealth: Payer: Self-pay | Admitting: Internal Medicine

## 2020-01-25 NOTE — Telephone Encounter (Signed)
Patient called today She stated that she normally has paperwork come to the office for her bed pads. She said that she is now using [employer] to receive these and wanted to make you aware that she is using a different company so the correct paperwork gets filled out

## 2020-01-26 NOTE — Telephone Encounter (Signed)
Yes. We received that paperwork this morning and I made a note for Dr Alphonsus Sias letting him know that it was ok to sign per pt.

## 2020-01-27 ENCOUNTER — Telehealth: Payer: Self-pay | Admitting: Internal Medicine

## 2020-01-27 NOTE — Telephone Encounter (Signed)
Patient called today and stated that she never received the results in the mail She would like to know if these could be resent

## 2020-01-27 NOTE — Telephone Encounter (Signed)
I will send them, again.

## 2020-02-18 ENCOUNTER — Other Ambulatory Visit: Payer: Self-pay

## 2020-02-18 MED ORDER — TRAMADOL HCL 50 MG PO TABS: ORAL_TABLET | ORAL | 0 refills | Status: DC

## 2020-02-18 NOTE — Telephone Encounter (Signed)
Name of Medication: tramadol 50 mg Name of Pharmacy: [facility] Last [place] or Written Date and Quantity: # 120 on 01/21/20 Last Office Visit and Type:12/24/2019 MWV  Next Office Visit and Type: 12/27/2020 CPX Last Controlled Substance Agreement Date: none Last [id number]  Raynelle Fanning at [facility] said pt will be out of med on 02/19/20.

## 2020-02-18 NOTE — Telephone Encounter (Signed)
[name] at [facility] said she did receive the Tramadol rx electronically.

## 2020-03-16 ENCOUNTER — Other Ambulatory Visit: Payer: Self-pay | Admitting: Internal Medicine

## 2020-03-16 MED ORDER — TRAMADOL HCL 50 MG PO TABS: ORAL_TABLET | ORAL | 0 refills | Status: DC

## 2020-03-16 NOTE — Telephone Encounter (Signed)
We have not received a fax refill request. I will send request to [facility] in Dr Karle Starch absence

## 2020-03-16 NOTE — Telephone Encounter (Signed)
Name of Medication: tramadol 50 mg Name of Pharmacy: [facility] Last [place] or Written Date and Quantity: # 120 on 02/18/20 Last Office Visit and Type:12/24/2019 MWV  Next Office Visit and Type: 12/27/2020 CPX Last Controlled Substance Agreement Date: none Last [id number]

## 2020-03-16 NOTE — Telephone Encounter (Signed)
I called the rx in to the pharmacy.

## 2020-03-16 NOTE — Telephone Encounter (Signed)
[name] With [facility] called to request a refill for the Patient Tramadol Stated she has been trying to fax over to [place] and fax will not go ,

## 2020-03-18 ENCOUNTER — Other Ambulatory Visit: Payer: Self-pay | Admitting: Internal Medicine

## 2020-04-07 ENCOUNTER — Ambulatory Visit: Payer: Medicare Other | Admitting: Family Medicine

## 2020-04-07 ENCOUNTER — Other Ambulatory Visit: Payer: Self-pay

## 2020-04-07 ENCOUNTER — Encounter: Payer: Self-pay | Admitting: Emergency Medicine

## 2020-04-07 ENCOUNTER — Ambulatory Visit (INDEPENDENT_AMBULATORY_CARE_PROVIDER_SITE_OTHER): Payer: Medicare Other

## 2020-04-07 ENCOUNTER — Ambulatory Visit
Admission: EM | Admit: 2020-04-07 | Discharge: 2020-04-07 | Disposition: A | Discharge status: Home / Self Care | Payer: Medicare Other | Attending: Family Medicine | Admitting: Family Medicine

## 2020-04-07 ENCOUNTER — Telehealth: Payer: Self-pay

## 2020-04-07 DIAGNOSIS — J189 Pneumonia, unspecified organism: Secondary | ICD-10-CM

## 2020-04-07 DIAGNOSIS — R062 Wheezing: Secondary | ICD-10-CM | POA: Diagnosis not present

## 2020-04-07 DIAGNOSIS — R05 Cough: Secondary | ICD-10-CM | POA: Diagnosis not present

## 2020-04-07 DIAGNOSIS — R0602 Shortness of breath: Secondary | ICD-10-CM | POA: Diagnosis not present

## 2020-04-07 MED ORDER — AMOXICILLIN-POT CLAVULANATE 875-125 MG PO TABS: 1.0000 | ORAL_TABLET | Freq: Two times a day (BID) | ORAL | 0 refills | Status: DC

## 2020-04-07 MED ORDER — DOXYCYCLINE HYCLATE 100 MG PO TABS: 100.0000 mg | ORAL_TABLET | Freq: Two times a day (BID) | ORAL | 0 refills | Status: DC

## 2020-04-07 MED ORDER — ALBUTEROL SULFATE HFA 108 (90 BASE) MCG/ACT IN AERS: 1.0000 | INHALATION_SPRAY | Freq: Four times a day (QID) | RESPIRATORY_TRACT | 0 refills | Status: DC | PRN

## 2020-04-07 NOTE — Telephone Encounter (Signed)
Carollee Herter, TL advised and will look into this

## 2020-04-07 NOTE — ED Triage Notes (Addendum)
Patient c/o cough, congestion and SOB that started 2-3 weeks ago.  Patient denies fevers.  Patient did not want a COVID test.

## 2020-04-07 NOTE — Telephone Encounter (Signed)
Looks like she is in UC now  Thanks

## 2020-04-07 NOTE — Telephone Encounter (Signed)
Coughing

## 2020-04-07 NOTE — Telephone Encounter (Signed)
Contacted pt and advised she could have a VV or go to an UC. Pt is not able to do a VV. Advised it would be best to go to the UC so she can be physically assessed. Pt agreed. Gave number to UC and location. Pt said she will go today. Advised to f/u with PCP if anything is needed. Pt verbalized understanding.

## 2020-04-07 NOTE — Telephone Encounter (Signed)
Pt is already scheduled to see Dr Milinda Antis 04/07/20 at 3PM.

## 2020-04-07 NOTE — Telephone Encounter (Signed)
[facility] - Client TELEPHONE ADVICE RECORD [facility] Patient Name: Autumn Price Gender: Female DOB: 1941-10-16 Age: 79 Y 7 M 29 D Return Phone Number: [phone] (Primary) Address: City/State/Zip: [place] [address] Client [facility] - Client Client Site [facility] - Day Physician Tillman Abide - MD Contact Type Call Who Is Calling Patient / Member / Family / Caregiver Call Type Triage / Clinical Relationship To Patient Self Return Phone Number [phone] (Primary) Chief Complaint BREATHING - shortness of breath or sounds breathless Reason for Call Symptomatic / Request for Health Information Initial Comment Caller states she is having SOB. Translation No Nurse Assessment Nurse: Clarita Leber, RN, Deborah Date/Time (Eastern Time): 04/07/2020 12:30:48 PM Confirm and document reason for call. If symptomatic, describe symptoms. ---The caller states that she is having SOB that is worsening. Getting worse. Has had cough with coughing up phlegm that is clear. Tired and "give out all the time." Has the patient had close contact with a person known or suspected to have the novel coronavirus illness OR traveled / lives in area with major community spread (including international travel) in the last 14 days from the onset of symptoms? * If Asymptomatic, screen for exposure and travel within the last 14 days. ---No Does the patient have any new or worsening symptoms? ---Yes Will a triage be completed? ---Yes Related visit to physician within the last 2 weeks? ---No Does the PT have any chronic conditions? (i.e. diabetes, asthma, this includes High risk factors for pregnancy, etc.) ---Yes List chronic conditions. ---hypertension takes amlodipine 5 mg, certline 50 mg and Lasix as needed Is this a behavioral health or substance abuse call? ---No Guidelines Guideline Title Affirmed Question Affirmed Notes  Nurse Date/Time (Eastern Time) Breathing Difficulty [1] MILD difficulty breathing (e.g., minimal/ no SOB at rest, SOB with walking, pulse <100) AND [2] NEW-onset or WORSE than normal Womble, RN, Gavin Pound 04/07/2020 12:33:59 PM PLEASE NOTE: All timestamps contained within this report are represented as [place] Standard Time. CONFIDENTIALTY NOTICE: This fax transmission is intended only for the addressee. It contains information that is legally privileged, confidential or otherwise protected from use or disclosure. If you are not the intended recipient, you are strictly prohibited from reviewing, disclosing, copying using or disseminating any of this information or taking any action in reliance on or regarding this information. If you have received this fax in error, please notify us immediately by telephone so that we can arrange for its return to [place]. Phone: [phone], Toll-Free: [phone], Fax: [phone] Page: 2 of 2 Call Id: [id number] Disp. Time [name] Time) Disposition Final User 04/07/2020 12:26:46 PM Send to Urgent Queue Gerhard Perches 04/07/2020 12:47:31 PM See HCP within 4 Hours (or PCP triage) Yes Clarita Leber, RN, Jetty Duhamel Disagree/Comply Comply Caller Understands Yes PreDisposition Call Doctor Care Advice Given Per Guideline SEE HCP WITHIN 4 HOURS (OR PCP TRIAGE): Referrals Warm transfer to backline

## 2020-04-08 NOTE — ED Provider Notes (Signed)
[facility]    CSN: [id number] Arrival date & time: 04/07/20  1517      History   Chief Complaint Chief Complaint  Patient presents with  . Cough  . Shortness of Breath    HPI Autumn Price is a 79 y.o. female.   79 yo female with a c/o cough, chest congestion and shortness of breath for the past 2 weeks. Denies any fevers, chills, vomiting, chest pains, wheezing.    Cough Associated symptoms: shortness of breath   Shortness of Breath Associated symptoms: cough     Past Medical History:  Diagnosis Date  . Depression   . Hypertension   . Osteoarthritis   . Urge incontinence   . Venous insufficiency     Patient Active Problem List   Diagnosis Date Noted  . Chronic diastolic heart failure (HCC) 10/25/2019  . DOE (dyspnea on exertion) 10/11/2019  . Acute non-recurrent frontal sinusitis 07/28/2019  . Preoperative evaluation to rule out surgical contraindication 07/13/2018  . Fatigue 04/24/2018  . Primary osteoarthritis of right knee 03/20/2018  . S/P knee replacement 03/20/2018  . Osteoarthritis of left knee 08/30/2016  . Advance directive discussed with patient 08/04/2015  . Mood disorder (HCC) 06/10/2014  . Morbid obesity (HCC) 05/22/2012  . Routine general medical examination at a health care facility 03/19/2011  . Urge incontinence 03/19/2011  . Essential hypertension, benign 08/21/2010  . Chronic venous insufficiency 08/21/2010  . Osteoarthritis, multiple sites 08/21/2010    Past Surgical History:  Procedure Laterality Date  . APPENDECTOMY  1966  . DILATION AND CURETTAGE OF UTERUS  01/27/14   benign  . JOINT REPLACEMENT     right knee  . TOTAL KNEE ARTHROPLASTY Right 03/20/2018   Procedure: RIGHT TOTAL KNEE ARTHROPLASTY;  Surgeon: Eugenia Mcalpine, MD;  Location: [facility];  Service: Orthopedics;  Laterality: Right;  . TOTAL KNEE ARTHROPLASTY Left 09/11/2018   Procedure: LEFT TOTAL KNEE ARTHROPLASTY;  Surgeon: Eugenia Mcalpine, MD;  Location: [facility]  [facility];  Service: Orthopedics;  Laterality: Left;  . TUBAL LIGATION  1966    OB History   No obstetric history on file.      Home Medications    Prior to Admission medications   Medication Sig Start Date End Date Taking? Authorizing Provider  acetaminophen (TYLENOL) 500 MG tablet Take 500 mg by mouth every 6 (six) hours as needed for mild pain or moderate pain.    Yes [provider]  amLODipine (NORVASC) 5 MG tablet TAKE ONE TABLET BY MOUTH ONCE DAILY 08/25/19  Yes Karie Schwalbe, MD  furosemide (LASIX) 20 MG tablet TAKE ONE TABLET BY MOUTH ONCE DAILY 01/21/20  Yes Tillman Abide I, MD  sertraline (ZOLOFT) 50 MG tablet TAKE 1 AND 1/2 TABLETS BY MOUTH ONCE DAILY 05/24/19  Yes Karie Schwalbe, MD  traMADol (ULTRAM) 50 MG tablet TAKE TWO TABLETS BY MOUTH TWICE DAILY AS NEEDED MODERATE PAIN 03/16/20  Yes Lorre Munroe, NP  albuterol (VENTOLIN HFA) 108 (90 Base) MCG/ACT inhaler Inhale 1-2 puffs into the lungs every 6 (six) hours as needed for wheezing or shortness of breath. 04/07/20   Payton Mccallum, MD  amoxicillin-clavulanate (AUGMENTIN) 875-125 MG tablet Take 1 tablet by mouth 2 (two) times daily. 04/07/20   Payton Mccallum, MD  doxycycline (VIBRA-TABS) 100 MG tablet Take 1 tablet (100 mg total) by mouth 2 (two) times daily. 04/07/20   Payton Mccallum, MD  ketoconazole (NIZORAL) 2 % cream APPLY 1 APPLICATION TOPICALLY TO GROIN AREA 2 TIMES  A DAY AS NEEDED FOR IRRITATION 03/20/20   Venia Carbon, MD  [facility] injection ADMINISTERED VIA PHARMACIST 12/31/19   Venia Carbon, MD    Family History Family History  Problem Relation Age of Onset  . Heart disease Father        died age 19- MI  . Alcohol abuse Brother   . Heart disease Brother   . Heart disease Sister   . Breast cancer Neg Hx     Social History Social History   Tobacco Use  . Smoking status: Never Smoker  . Smokeless tobacco: Never Used  Substance Use Topics  . Alcohol use: No  . Drug use: No      Allergies   Patient has no known allergies.   Review of Systems Review of Systems  Respiratory: Positive for cough and shortness of breath.      Physical Exam Triage Vital Signs ED Triage Vitals  Enc Vitals Group     BP 04/07/20 1542 (!) 160/65     Pulse Rate 04/07/20 1542 68     Resp 04/07/20 1542 17     Temp 04/07/20 1542 97.7 F (36.5 C)     Temp Source 04/07/20 1542 Oral     SpO2 04/07/20 1542 98 %     Weight 04/07/20 1539 245 lb (111.1 kg)     Height 04/07/20 1539 5\' 4"  (1.626 m)     Head Circumference --      Peak Flow --      Pain Score 04/07/20 1538 0     Pain Loc --      Pain Edu? --      Excl. in [place]? --    No data found.  Updated Vital Signs BP (!) 160/65 (BP Location: Right Arm)   Pulse 68   Temp 97.7 F (36.5 C) (Oral)   Resp 17   Ht 5\' 4"  (1.626 m)   Wt 111.1 kg   SpO2 98%   BMI 42.05 kg/m   Visual Acuity Right Eye Distance:   Left Eye Distance:   Bilateral Distance:    Right Eye Near:   Left Eye Near:    Bilateral Near:     Physical Exam Vitals and nursing note reviewed.  Constitutional:      General: She is not in acute distress.    Appearance: Normal appearance. She is not toxic-appearing or diaphoretic.  Cardiovascular:     Rate and Rhythm: Normal rate.     Heart sounds: Normal heart sounds.  Pulmonary:     Effort: Pulmonary effort is normal. No respiratory distress.     Breath sounds: Rhonchi present. No wheezing.  Neurological:     Mental Status: She is alert.      UC Treatments / Results  Labs (all labs ordered are listed, but only abnormal results are displayed) Labs Reviewed - No data to display  EKG   Radiology DG Chest 2 View  Result Date: 04/07/2020 CLINICAL DATA:  Shortness of breath.  Cough. EXAM: CHEST - 2 VIEW COMPARISON:  October 08, 2019 FINDINGS: The heart size is borderline. The hila and mediastinum are normal. No pneumothorax. Mild opacity in left base. Lungs are otherwise clear. No other acute  abnormalities. IMPRESSION: There is mild opacity in the left base. The appearance is most suggestive of atelectasis. Pneumonia is considered less likely. Electronically Signed   By: Dorise Bullion III M.D   On: 04/07/2020 16:26    Procedures Procedures (including critical care  time)  Medications Ordered in UC Medications - No data to display  Initial Impression / Assessment and Plan / UC Course  I have reviewed the triage vital signs and the nursing notes.  Pertinent labs & imaging results that were available during my care of the patient were reviewed by me and considered in my medical decision making (see chart for details).     Final Clinical Impressions(s) / UC Diagnoses   Final diagnoses:  Community acquired pneumonia of left lower lobe of lung  Wheezing    ED Prescriptions    Medication Sig Dispense Auth. Provider   amoxicillin-clavulanate (AUGMENTIN) 875-125 MG tablet Take 1 tablet by mouth 2 (two) times daily. 14 tablet Norah Fick, Pamala Hurry, MD   doxycycline (VIBRA-TABS) 100 MG tablet Take 1 tablet (100 mg total) by mouth 2 (two) times daily. 14 tablet Rhett Najera, Pamala Hurry, MD   albuterol (VENTOLIN HFA) 108 (90 Base) MCG/ACT inhaler Inhale 1-2 puffs into the lungs every 6 (six) hours as needed for wheezing or shortness of breath. 8 g Payton Mccallum, MD      1. Chest x-ray results and diagnosis reviewed with patient 2. rx as per orders above; reviewed possible side effects, interactions, risks and benefits  3. Recommend supportive treatment with rest, fluids 4. Close follow up with PCP next week 5. Follow-up prn if symptoms worsen or don't improve   PDMP not reviewed this encounter.   Payton Mccallum, MD 04/08/20 1407

## 2020-04-09 NOTE — Telephone Encounter (Signed)
Please call to check on her CXR read as likely atelectasis--not pneumonia Is on antibiotic Make sure she is feeling better

## 2020-04-10 NOTE — Telephone Encounter (Signed)
Spoke to pt. She said she is feeling better but not good. Still taking the antibiotic.

## 2020-04-16 ENCOUNTER — Other Ambulatory Visit: Payer: Self-pay | Admitting: Internal Medicine

## 2020-04-17 NOTE — Telephone Encounter (Signed)
Last Fill # 120 on 02/18/20 Last OV 12-24-19 Next OV 12-27-20 [facility]

## 2020-05-02 ENCOUNTER — Ambulatory Visit
Admission: EM | Admit: 2020-05-02 | Discharge: 2020-05-02 | Disposition: A | Discharge status: Home / Self Care | Payer: Medicare Other | Attending: Emergency Medicine | Admitting: Emergency Medicine

## 2020-05-02 ENCOUNTER — Ambulatory Visit (INDEPENDENT_AMBULATORY_CARE_PROVIDER_SITE_OTHER): Payer: Medicare Other

## 2020-05-02 ENCOUNTER — Telehealth: Payer: Self-pay

## 2020-05-02 ENCOUNTER — Other Ambulatory Visit: Payer: Self-pay

## 2020-05-02 DIAGNOSIS — R05 Cough: Secondary | ICD-10-CM | POA: Diagnosis not present

## 2020-05-02 DIAGNOSIS — R059 Cough, unspecified: Secondary | ICD-10-CM

## 2020-05-02 DIAGNOSIS — R0602 Shortness of breath: Secondary | ICD-10-CM | POA: Diagnosis not present

## 2020-05-02 DIAGNOSIS — R1110 Vomiting, unspecified: Secondary | ICD-10-CM

## 2020-05-02 DIAGNOSIS — G4483 Primary cough headache: Secondary | ICD-10-CM

## 2020-05-02 MED ORDER — PREDNISONE 10 MG (21) PO TBPK: ORAL_TABLET | Freq: Every day | ORAL | 0 refills | Status: DC

## 2020-05-02 MED ORDER — ATROVENT HFA 17 MCG/ACT IN AERS: 2.0000 | INHALATION_SPRAY | Freq: Three times a day (TID) | RESPIRATORY_TRACT | 0 refills | Status: DC | PRN

## 2020-05-02 MED ORDER — HYDROCOD POLST-CPM POLST ER 10-8 MG/5ML PO SUER: 5.0000 mL | Freq: Two times a day (BID) | ORAL | 0 refills | Status: DC | PRN

## 2020-05-02 MED ORDER — ALBUTEROL SULFATE HFA 108 (90 BASE) MCG/ACT IN AERS: 1.0000 | INHALATION_SPRAY | Freq: Four times a day (QID) | RESPIRATORY_TRACT | 0 refills | Status: DC | PRN

## 2020-05-02 NOTE — Discharge Instructions (Addendum)
It was very nice seeing you today in clinic. Thank you for entrusting me with your care.   Please utilize the medications that we discussed. Your prescriptions has been called in to your pharmacy.   Make arrangements to follow up with your regular doctor in 1 week for re-evaluation if not improving. If your symptoms/condition worsens, please seek follow up care either here or in the ER. Please remember, our [facility] providers are "right here with you" when you need us.   Again, it was my pleasure to take care of you today. Thank you for choosing our clinic. I hope that you start to feel better quickly.   Whitni Pasquini, MSN, APRN, FNP-C, CEN Advanced Practice Provider [facility] 

## 2020-05-02 NOTE — Telephone Encounter (Signed)
[facility] - Client TELEPHONE ADVICE RECORD [facility] Patient Name: Autumn Price Gender: Female DOB: 10-27-1941 Age: 79 Y 63 M 23 D Return Phone Number: [phone] (Primary) Address: City/State/Zip: [place] [address] Client [facility] - Client Client Site [facility] - Day Physician [name] - MD Contact Type Call Who Is Calling Patient / Member / Family / Caregiver Call Type Triage / Clinical Relationship To Patient Self Return Phone Number [phone] (Primary) Chief Complaint WHEEZING Reason for Call Symptomatic / Request for Health Information Initial Comment caller is having a cough/wheezing/ shortness and was seen in Urgent care on 4/23 for pneumonia but was then told the lower left lobe had collapsed -- Caller states because of her coughing, she was told she could not come in the office [place] Not Listed [facility] urgent care Translation No Nurse Assessment Nurse: [name], RN, [name] Date/Time (Eastern Time): 05/02/2020 1:05:45 PM Confirm and document reason for call. If symptomatic, describe symptoms. ---Caller states she went to the urgent care who said she had pneumonia. Doctor's office said her left lower lobe had collapsed. Still having coughing spells. Has SOB. No fever or chest pain. Has the patient had close contact with a person known or suspected to have the novel coronavirus illness OR traveled / lives in area with major community spread (including international travel) in the last 14 days from the onset of symptoms? * If Asymptomatic, screen for exposure and travel within the last 14 days. ---No Does the patient have any new or worsening symptoms? ---Yes Will a triage be completed? ---Yes Related visit to physician within the last 2 weeks? ---No Does the PT have any chronic conditions? (i.e. diabetes, asthma, this includes High risk factors for pregnancy,  etc.) ---Yes List chronic conditions. ---HTN Is this a behavioral health or substance abuse call? ---No Guidelines Guideline Title Affirmed Question Affirmed Notes Nurse Date/Time (Eastern Time) Cough - Chronic [1] MILD difficulty breathing (e.g., minimal/ no SOB at rest, SOB with [name], RN, [name] 05/02/2020 1:08:54 PM PLEASE NOTE: All timestamps contained within this report are represented as [place] Standard Time. CONFIDENTIALTY NOTICE: This fax transmission is intended only for the addressee. It contains information that is legally privileged, confidential or otherwise protected from use or disclosure. If you are not the intended recipient, you are strictly prohibited from reviewing, disclosing, copying using or disseminating any of this information or taking any action in reliance on or regarding this information. If you have received this fax in error, please notify us immediately by telephone so that we can arrange for its return to [place]. Phone: [phone], Toll-Free: [phone], Fax: [phone] Page: 2 of 2 Call Id: [id number] Guidelines Guideline Title Affirmed Question Affirmed Notes Nurse Date/Time [name] Time) walking, pulse <100) AND [2] still present when not coughing (Exception: no change from usual, chronic shortness of breath) Disp. Time [name] Time) Disposition Final User 05/02/2020 1:04:01 PM Send to Urgent [name], [name] 05/02/2020 1:20:30 PM See HCP within 4 Hours (or PCP triage) Yes [name], RN, [name] Disagree/Comply Comply Caller Understands Yes PreDisposition Did not know what to do Care Advice Given Per Guideline SEE HCP WITHIN 4 HOURS (OR PCP TRIAGE): * IF OFFICE WILL BE OPEN: You need to be seen within the next 3 or 4 hours. Call your doctor (or NP/PA) now or as soon as the office opens. CALL BACK IF: * You become worse. CARE ADVICE given per Cough - Chronic (  Adult) guideline. Comments User: [name], RN Date/Time [name]  Time): 05/02/2020 1:19:54 PM called office and gave report that pt has cough with SOB with exertion. triage outcome of see physician within 4 hrs. No appts available and pt needs to go to urgent care. States she will go to [facility] urgent care. User: [name], RN Date/Time [name] Time): 05/02/2020 1:21:15 PM has SOB with exertion Referrals GO TO FACILITY OTHER - SPECIFY

## 2020-05-02 NOTE — ED Provider Notes (Signed)
[place], [place]   Name: Autumn Price DOB: 06-Nov-1941 MRN: [id number] CSN: [id number] PCP: Karie Schwalbe, MD  Arrival date and time:  05/02/20 1427  Chief Complaint:  Shortness of Breath  NOTE: Prior to seeing the patient today, I have reviewed the triage nursing documentation and vital signs. Clinical staff has updated patient's PMH/PSHx, current medication list, and drug allergies/intolerances to ensure comprehensive history available to assist in medical decision making.   History:   HPI: Autumn Price is a 79 y.o. female who presents today with complaints of cough and shortness of breath for the last 6 months. Patient notes that her symptoms have worsened over the last 2 months. Shortness of breath is noted to be worse with ambulation and at night. Patient reports significant fatigue with minimal short distance ambulation. Cough is minimally productive of yellow sputum. She denies associated fevers. She describes episodes of post-tussive vomiting and generalized headaches following severe coughing fits that she has at night. She denies orthopnea. Patient reports that she was seen here back on 04/07/2020 at which time she was diagnosed with CAP in her LLL; treated with amoxicillin-clavulanate and doxycycline. She completed the ABX with minimal improvement of her symptoms. Patient presents with a BLE edema; it is not pitting. PMH (+) for chronic diastolic heart failure for which she takes PRN furosemide; has not taken in over a month.   Past Medical History:  Diagnosis Date  . Depression   . Hypertension   . Osteoarthritis   . Urge incontinence   . Venous insufficiency     Past Surgical History:  Procedure Laterality Date  . APPENDECTOMY  1966  . DILATION AND CURETTAGE OF UTERUS  01/27/14   benign  . JOINT REPLACEMENT     right knee  . TOTAL KNEE ARTHROPLASTY Right 03/20/2018   Procedure: RIGHT TOTAL KNEE ARTHROPLASTY;  Surgeon: Eugenia Mcalpine, MD;  Location: [facility];  Service:  Orthopedics;  Laterality: Right;  . TOTAL KNEE ARTHROPLASTY Left 09/11/2018   Procedure: LEFT TOTAL KNEE ARTHROPLASTY;  Surgeon: Eugenia Mcalpine, MD;  Location: [facility];  Service: Orthopedics;  Laterality: Left;  . TUBAL LIGATION  1966    Family History  Problem Relation Age of Onset  . Heart disease Father        died age 23- MI  . Alcohol abuse Brother   . Heart disease Brother   . Heart disease Sister   . Breast cancer Neg Hx     Social History   Tobacco Use  . Smoking status: Never Smoker  . Smokeless tobacco: Never Used  Substance Use Topics  . Alcohol use: No  . Drug use: No    Patient Active Problem List   Diagnosis Date Noted  . Chronic diastolic heart failure (HCC) 10/25/2019  . DOE (dyspnea on exertion) 10/11/2019  . Acute non-recurrent frontal sinusitis 07/28/2019  . Preoperative evaluation to rule out surgical contraindication 07/13/2018  . Fatigue 04/24/2018  . Primary osteoarthritis of right knee 03/20/2018  . S/P knee replacement 03/20/2018  . Osteoarthritis of left knee 08/30/2016  . Advance directive discussed with patient 08/04/2015  . Mood disorder (HCC) 06/10/2014  . Morbid obesity (HCC) 05/22/2012  . Routine general medical examination at a health care facility 03/19/2011  . Urge incontinence 03/19/2011  . Essential hypertension, benign 08/21/2010  . Chronic venous insufficiency 08/21/2010  . Osteoarthritis, multiple sites 08/21/2010    Home Medications:    Current Meds  Medication Sig  . acetaminophen (TYLENOL) 500 MG  tablet Take 500 mg by mouth every 6 (six) hours as needed for mild pain or moderate pain.   Marland Kitchen amLODipine (NORVASC) 5 MG tablet TAKE ONE TABLET BY MOUTH ONCE DAILY  . furosemide (LASIX) 20 MG tablet TAKE ONE TABLET BY MOUTH ONCE DAILY  . SHINGRIX injection ADMINISTERED VIA PHARMACIST  . traMADol (ULTRAM) 50 MG tablet TAKE TWO TABLETS BY MOUTH TWICE DAILY AS NEEDED MODERATE PAIN  . [DISCONTINUED] albuterol (VENTOLIN HFA) 108 (90  Base) MCG/ACT inhaler Inhale 1-2 puffs into the lungs every 6 (six) hours as needed for wheezing or shortness of breath.    Allergies:   Patient has no known allergies.  Review of Systems (ROS):  Review of systems NEGATIVE unless otherwise noted in narrative H&P section.   Vital Signs: Today's Vitals   05/02/20 1450 05/02/20 1454 05/02/20 1556  BP:  (!) 164/70   Pulse:  77   Resp:  20   Temp:  98.4 F (36.9 C)   SpO2:  97%   PainSc: 0-No pain  0-No pain    Physical Exam: Physical Exam  Constitutional: She is oriented to person, place, and time and well-developed, well-nourished, and in no distress.  HENT:  Head: Normocephalic and atraumatic.  Eyes: Pupils are equal, round, and reactive to light.  Cardiovascular: Normal rate, regular rhythm, normal heart sounds and intact distal pulses.  Pulmonary/Chest: Effort normal. She has decreased breath sounds in the left lower field. She has wheezes (scattered expiratory).  Moderate to severe cough noted in clinic. No SOB or increased WOB. No distress. Able to speak in complete sentences without difficulties. SPO2 97% on RA.  Musculoskeletal:        General: Edema (1+ non-pitting edema to BLE; ankle to knee) present. No tenderness.     Cervical back: Normal range of motion and neck supple.  Lymphadenopathy:    She has no cervical adenopathy.  Neurological: She is alert and oriented to person, place, and time. She has normal sensation. Gait normal.  Skin: Skin is warm and dry. No rash noted. She is not diaphoretic.  Psychiatric: Memory, affect and judgment normal. Her mood appears anxious.  Nursing note and vitals reviewed.   Urgent Care Treatments / Results:   Orders Placed This Encounter  Procedures  . DG Chest 2 View    LABS: PLEASE NOTE: all labs that were ordered this encounter are listed, however only abnormal results are displayed. Labs Reviewed - No data to display  EKG: -None  RADIOLOGY: DG Chest 2  View  Result Date: 05/02/2020 CLINICAL DATA:  Shortness of breath x6 months. EXAM: CHEST - 2 VIEW COMPARISON:  April 07, 2020 FINDINGS: Very mild, stable linear atelectasis is seen within the left lung base. There is no evidence of acute infiltrate, pleural effusion or pneumothorax. The heart size and mediastinal contours are within normal limits. There is mild calcification of the aortic arch. The visualized skeletal structures are unremarkable. IMPRESSION: Stable mild left basilar linear atelectasis. Electronically Signed   By: Aram Candela M.D.   On: 05/02/2020 15:25    PROCEDURES: Procedures  MEDICATIONS RECEIVED THIS VISIT: Medications - No data to display  PERTINENT CLINICAL COURSE NOTES/UPDATES:   Initial Impression / Assessment and Plan / Urgent Care Course:  Pertinent labs & imaging results that were available during my care of the patient were personally reviewed by me and considered in my medical decision making (see lab/imaging section of note for values and interpretations).  Autumn Price is a 79  y.o. female who presents to [facility] today with complaints of Shortness of Breath  Patient is well appearing overall in clinic today. She does not appear to be in any acute distress. Presenting symptoms (see HPI) and exam as documented above. Repeat CXR today reveals no areas of focal consolidation or new opacities. There is stable LLL atelectasis. Exam reveals significant cough and wheezing today. (+) post-tussive emesis and headache reported. Patient prescribed SABA (albuterol) MDI, however notes that she thinks it is defective; no medication aerosolizes when depressed. Will refill albuterol MDI and also send in Rx for ipratropium MDI for PRN use. Wheezing is significant; SPO2 normal. Sending in systemic steroid (prednisone) taper. Patient's cough is her most distressing factor. She has used guaifenesin and benzonatate with no improvement. Patient requesting Tussionex citing  that she has used this medication in the past without issues. Discussed indications and side effects; Rx sent for short term supply. If not improving, patient may benefit from referral to PCCM for further evaluation, which will likely include formal PFTs. Patient is concerned about potential for malignancy. Discussed no immediate concerns on diagnostic radiographs, however CT imaging could be considered for a more definitive rule out. Patient to discuss with Dr. Alphonsus Sias.   Discussed follow up with primary care physician in 1 week for re-evaluation. I have reviewed the follow up and strict return precautions for any new or worsening symptoms. Patient is aware of symptoms that would be deemed urgent/emergent, and would thus require further evaluation either here or in the emergency department. At the time of discharge, she verbalized understanding and consent with the discharge plan as it was reviewed with her. All questions were fielded by provider and/or clinic staff prior to patient discharge.    Final Clinical Impressions / Urgent Care Diagnoses:   Final diagnoses:  Cough  Post-tussive emesis  Cough headache    New Prescriptions:  [place] Controlled Substance Registry consulted? Yes, I have consulted the [place] Controlled Substances Registry for this patient, and feel the risk/benefit ratio today is favorable for proceeding with this prescription for a controlled substance.  . Discussed use of controlled substance medication to treat her acute symptoms.  o Reviewed [place] STOP Act regulations  o Clinic does not refill controlled substances over the phone without face to face evaluation.  . Safety precautions reviewed.  o Medications should not be sold or taken with alcohol.  o Avoid use while working, driving, or operating heavy machinery.  o Side effects associated with the use of this particular medication reviewed. - Patient understands that this medication can cause CNS depression, increase her risk of  falls, and even lead to overdose that may result in death, if used outside of the parameters that she and I discussed.  With all of this in mind, she knowingly accepts the risks and responsibilities associated with intended course of treatment, and elects to responsibly proceed as discussed.  Meds ordered this encounter  Medications  . albuterol (VENTOLIN HFA) 108 (90 Base) MCG/ACT inhaler    Sig: Inhale 1-2 puffs into the lungs every 6 (six) hours as needed for wheezing or shortness of breath.    Dispense:  18 g    Refill:  0  . ipratropium (ATROVENT HFA) 17 MCG/ACT inhaler    Sig: Inhale 2 puffs into the lungs 3 (three) times daily as needed for wheezing.    Dispense:  1 Inhaler    Refill:  0  . predniSONE (STERAPRED UNI-PAK 21 TAB) 10 MG (21)  TBPK tablet    Sig: Take by mouth daily. 60 mg x 1 day, 50 mg x 1 day, 40 mg x 1 day, 30 mg x 1 day, 20 mg x 1 day, 10 mg x 1 day    Dispense:  21 tablet    Refill:  0  . chlorpheniramine-HYDROcodone (TUSSIONEX PENNKINETIC ER) 10-8 MG/5ML SUER    Sig: Take 5 mLs by mouth every 12 (twelve) hours as needed for cough. Will cause drowsiness; NO DRIVING.    Dispense:  70 mL    Refill:  0    Recommended Follow up Care:  Patient encouraged to follow up with the following provider within the specified time frame, or sooner as dictated by the severity of her symptoms. As always, she was instructed that for any urgent/emergent care needs, she should seek care either here or in the emergency department for more immediate evaluation.  Follow-up Information    Venia Carbon, MD In 1 week.   Specialties: Internal Medicine, Pediatrics Why: General reassessment of symptoms if not improving Contact information: [address] [place] [id number] [phone]         NOTE: This note was prepared using Dragon dictation software along with smaller phrase technology. Despite my best ability to proofread, there is the potential that  transcriptional errors may still occur from this process, and are completely unintentional.    Karen Kitchens, NP 05/02/20 2350

## 2020-05-02 NOTE — ED Triage Notes (Signed)
Pt reports cough, SOB x 6 mos worsening in past 2 months.  Increased SOB with ambulation and during coughing fits.  Reports feeling worn out just walking to another room.  Does not cough constantly - reports severe coughing spells mostly at night.  Pt states cough is only a "tiny bit" productive for yellow colored mucous.  Denies fever, chills, CP, or other symptoms.   Slight pitting edema noticed in BLE, left greater than right. Has not taken her PRN Lasix in over a month.   Was previously seen at [facility] and told she had pneumonia.  States her PCP told her she had a collapsed lung but didn't have her follow up with anyone.

## 2020-05-02 NOTE — Telephone Encounter (Signed)
Per chart review tab pt is at [facility]. 

## 2020-05-09 ENCOUNTER — Telehealth: Payer: Self-pay

## 2020-05-09 NOTE — Telephone Encounter (Signed)
I will leave this up to [facility] and Angelica Chessman to decide. We can bring her in through our backdoor if needed. I do not mind working her up.

## 2020-05-09 NOTE — Telephone Encounter (Signed)
I am willing to see her--but I don't see a negative COVID test or evidence of vaccination. I am not sure what the office policy would be for this

## 2020-05-09 NOTE — Telephone Encounter (Signed)
Pt was seen in the ED on 5/18 for cough, and headache. Patient was advised to follow up here in PCP office. Patient states she is still having productive cough, and headache off and on. No other sx such as fever, shortness of breath.  Patient is wondering if she can be seen here in the office, as she wants someone to evaluate her, but I advised I needed to speak with Carollee Herter and Dr. Alphonsus Sias first to see if this okay with these sx. Please advise.

## 2020-05-10 NOTE — Telephone Encounter (Signed)
Pt has been scheduled by the front office.

## 2020-05-10 NOTE — Telephone Encounter (Signed)
Pt c/o continued cough even after finishing prednisone. Pt has had cough for over 6 months but reports it has gotten worse over the last couple of months. Pt reports she does not cough all the time but there are coughing "spells" that she has. Pt denies any other symptoms currently. She reports she was told at the UC that they did not perform a covid test because they said "this is not covid."  Pt has not been exposed to anyone that is positive for covid that she is aware of. An x-ray was performed and revealed continued LLL atelectasis. Pt has not had any covid vaccines due to being scared with the cough she has. Pt was also prescribed an albuterol inhaler and  atrovent and she is using both as prescribed. She has contacted the office because she was told to contact PCP if when she was finished with the steroid she still had persistent symptoms. Pt reports the physician she saw at the UC said she needed further imaging with a CT. Advised pt that a msg would be sent to Dr. Alphonsus Sias and this office would f/u. Advised if symptoms worsened to go to the UC or ER. Pt verbalized understanding.

## 2020-05-10 NOTE — Telephone Encounter (Signed)
I am okay with bringing her into the office for my evaluation. I suspect I will need to set her up for a pulmonary evaluation--but will handle this at the visit

## 2020-05-11 ENCOUNTER — Other Ambulatory Visit: Payer: Self-pay

## 2020-05-11 ENCOUNTER — Encounter: Payer: Self-pay | Admitting: Internal Medicine

## 2020-05-11 ENCOUNTER — Ambulatory Visit (INDEPENDENT_AMBULATORY_CARE_PROVIDER_SITE_OTHER): Payer: Medicare Other | Admitting: Internal Medicine

## 2020-05-11 VITALS — BP 140/62 | HR 88 | Temp 98.5°F | Ht 63.75 in | Wt 265.5 lb

## 2020-05-11 DIAGNOSIS — R0600 Dyspnea, unspecified: Secondary | ICD-10-CM | POA: Diagnosis not present

## 2020-05-11 DIAGNOSIS — R053 Chronic cough: Secondary | ICD-10-CM | POA: Insufficient documentation

## 2020-05-11 DIAGNOSIS — R0609 Other forms of dyspnea: Secondary | ICD-10-CM

## 2020-05-11 DIAGNOSIS — I5032 Chronic diastolic (congestive) heart failure: Secondary | ICD-10-CM

## 2020-05-11 DIAGNOSIS — R05 Cough: Secondary | ICD-10-CM | POA: Diagnosis not present

## 2020-05-11 NOTE — Assessment & Plan Note (Signed)
I am concerned about interstitial disease Doesn't seem to be cough from CHF or fluid (but may be cofactor) Will set up with pulmonary

## 2020-05-11 NOTE — Progress Notes (Signed)
Subjective:    Patient ID: Autumn Price, female    DOB: 03/06/1941, 79 y.o.   MRN: [id number]  HPI Here due to persistent cough This visit occurred during the SARS-CoV-2 public health emergency.  Safety protocols were in place, including screening questions prior to the visit, additional usage of staff PPE, and extensive cleaning of exam room while observing appropriate contact time as indicated for disinfecting solutions.   Started with cough about 6 months ago Croupy type and non productive Has worsened over time --especially the past 3 months Now having DOE---mostly in past month. Happens just walking in house  Uses the furosemide intermittently ---~ every other day Chronic edema is some worse in the past week Some cough in bed---in spells (will cough then vomit) Uses one pillow---has had PND--will have to sit up in bed  No chest pain---but gets "uncomfortable" feeling in upper left chest at times Not exertional  Does get sense of heart beating fast at times--not frequent  Current Outpatient Medications on File Prior to Visit  Medication Sig Dispense Refill  . acetaminophen (TYLENOL) 500 MG tablet Take 500 mg by mouth every 6 (six) hours as needed for mild pain or moderate pain.     Marland Kitchen albuterol (VENTOLIN HFA) 108 (90 Base) MCG/ACT inhaler Inhale 1-2 puffs into the lungs every 6 (six) hours as needed for wheezing or shortness of breath. 18 g 0  . amLODipine (NORVASC) 5 MG tablet TAKE ONE TABLET BY MOUTH ONCE DAILY 30 tablet 11  . chlorpheniramine-HYDROcodone (TUSSIONEX PENNKINETIC ER) 10-8 MG/5ML SUER Take 5 mLs by mouth every 12 (twelve) hours as needed for cough. Will cause drowsiness; NO DRIVING. 70 mL 0  . furosemide (LASIX) 20 MG tablet TAKE ONE TABLET BY MOUTH ONCE DAILY 30 tablet 11  . ipratropium (ATROVENT HFA) 17 MCG/ACT inhaler Inhale 2 puffs into the lungs 3 (three) times daily as needed for wheezing. 1 Inhaler 0  . traMADol (ULTRAM) 50 MG tablet TAKE TWO TABLETS BY  MOUTH TWICE DAILY AS NEEDED MODERATE PAIN 120 tablet 0  . [DISCONTINUED] sertraline (ZOLOFT) 50 MG tablet TAKE 1 AND 1/2 TABLETS BY MOUTH ONCE DAILY 45 tablet 11   No current facility-administered medications on file prior to visit.    No Known Allergies  Past Medical History:  Diagnosis Date  . Depression   . Hypertension   . Osteoarthritis   . Urge incontinence   . Venous insufficiency     Past Surgical History:  Procedure Laterality Date  . APPENDECTOMY  1966  . DILATION AND CURETTAGE OF UTERUS  01/27/14   benign  . JOINT REPLACEMENT     right knee  . TOTAL KNEE ARTHROPLASTY Right 03/20/2018   Procedure: RIGHT TOTAL KNEE ARTHROPLASTY;  Surgeon: Eugenia Mcalpine, MD;  Location: [facility];  Service: Orthopedics;  Laterality: Right;  . TOTAL KNEE ARTHROPLASTY Left 09/11/2018   Procedure: LEFT TOTAL KNEE ARTHROPLASTY;  Surgeon: Eugenia Mcalpine, MD;  Location: [facility];  Service: Orthopedics;  Laterality: Left;  . TUBAL LIGATION  1966    Family History  Problem Relation Age of Onset  . Heart disease Father        died age 27- MI  . Alcohol abuse Brother   . Heart disease Brother   . Heart disease Sister   . Breast cancer Neg Hx     Social History   Socioeconomic History  . Marital status: Divorced    Spouse name: Not on file  . Number of children: 3  .  Years of education: Not on file  . Highest education level: Not on file  Occupational History  . 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 Smoking status: Never Smoker  . Smokeless tobacco: Never Used  Substance and Sexual Activity  . Alcohol use: No  . Drug use: No  . Sexual activity: Not on file  Other Topics Concern  . Not on file  Social History Narrative   Has living will   Designated son Alcide Goodness and other children to make health care decisions for her   Would accept CPR/resuscitation   Hasn't considered tube feedings--would leave to son   Social Determinants  of Health   Financial Resource Strain:   . Difficulty of Paying Living Expenses:   Food Insecurity:   . Worried About Charity fundraiser in the Last Year:   . Arboriculturist in the Last Year:   Transportation Needs:   . Film/video editor (Medical):   Marland Kitchen Lack of Transportation (Non-Medical):   Physical Activity:   . Days of Exercise per Week:   . Minutes of Exercise per Session:   Stress:   . Feeling of Stress :   Social Connections:   . Frequency of Communication with Friends and Family:   . Frequency of Social Gatherings with Friends and Family:   . Attends Religious Services:   . Active Member of Clubs or Organizations:   . Attends Archivist Meetings:   Marland Kitchen Marital Status:   Intimate Partner Violence:   . Fear of Current or Ex-Partner:   . Emotionally Abused:   Marland Kitchen Physically Abused:   . Sexually Abused:    Review of Systems Eating about the same Surprised by weight gain seen today No fever    Objective:   Physical Exam  Constitutional: She appears well-developed. No distress.  Neck: No thyromegaly present.  Cardiovascular: Normal rate, regular rhythm, normal heart sounds and intact distal pulses. Exam reveals no gallop.  No murmur heard. Respiratory: Effort normal and breath sounds normal. No respiratory distress. She has no wheezes. She has no rales.  GI: Soft. There is no abdominal tenderness.  Musculoskeletal:     Comments: 2+ non pitting edema in calves--not feet  Lymphadenopathy:    She has no cervical adenopathy.           Assessment & Plan:

## 2020-05-11 NOTE — Assessment & Plan Note (Signed)
May be exacerbated with weight loss Use the furosemide daily

## 2020-05-11 NOTE — Assessment & Plan Note (Addendum)
Strikingly severe very recently Trouble even walking in her house Could be related to the cough----but also could be from coronary ischemia or CHF Will check labs--including BNP EKG--unable to do today, machine not working  Since this could be related to her CHF, will ask her to take furosemide every day Consider cardiology referral if no answer with the pulmonary Echo showed good LV function last fall

## 2020-05-12 LAB — COMPREHENSIVE METABOLIC PANEL
ALT: 9 U/L (ref 0–35)
AST: 14 U/L (ref 0–37)
Albumin: 4.2 g/dL (ref 3.5–5.2)
Alkaline Phosphatase: 96 U/L (ref 39–117)
BUN: 21 mg/dL (ref 6–23)
CO2: 30 mEq/L (ref 19–32)
Calcium: 9.4 mg/dL (ref 8.4–10.5)
Chloride: 103 mEq/L (ref 96–112)
Creatinine, Ser: 0.89 mg/dL (ref 0.40–1.20)
GFR: 61.54 mL/min (ref >60.00)
Glucose, Bld: 99 mg/dL (ref 70–99)
Potassium: 4.3 mEq/L (ref 3.5–5.1)
Sodium: 141 mEq/L (ref 135–145)
Total Bilirubin: 0.5 mg/dL (ref 0.2–1.2)
Total Protein: 7.2 g/dL (ref 6.0–8.3)

## 2020-05-12 LAB — CBC
HCT: 41.2 % (ref 36.0–46.0)
Hemoglobin: 13.7 g/dL (ref 12.0–15.0)
MCHC: 33.4 g/dL (ref 30.0–36.0)
MCV: 86.6 fl (ref 78.0–100.0)
Platelets: 125 10*3/uL — ABNORMAL LOW (ref 150.0–400.0)
RBC: 4.75 Mil/uL (ref 3.87–5.11)
RDW: 14.2 % (ref 11.5–15.5)
WBC: 6.5 10*3/uL (ref 4.0–10.5)

## 2020-05-12 LAB — T4, FREE: Free T4: 0.98 ng/dL (ref 0.60–1.60)

## 2020-05-12 LAB — BRAIN NATRIURETIC PEPTIDE: Pro B Natriuretic peptide (BNP): 158 pg/mL — ABNORMAL HIGH (ref 0.0–100.0)

## 2020-05-15 ENCOUNTER — Other Ambulatory Visit: Payer: Self-pay | Admitting: Internal Medicine

## 2020-05-15 DIAGNOSIS — F39 Unspecified mood [affective] disorder: Secondary | ICD-10-CM

## 2020-05-16 ENCOUNTER — Other Ambulatory Visit: Payer: Self-pay | Admitting: Internal Medicine

## 2020-05-16 DIAGNOSIS — F39 Unspecified mood [affective] disorder: Secondary | ICD-10-CM

## 2020-05-17 ENCOUNTER — Other Ambulatory Visit: Payer: Self-pay | Admitting: Internal Medicine

## 2020-05-17 DIAGNOSIS — F39 Unspecified mood [affective] disorder: Secondary | ICD-10-CM

## 2020-05-17 NOTE — Telephone Encounter (Signed)
Last filled 04-17-20 #120 Last OV 05-11-20 Next OV 12-27-20 [facility]

## 2020-05-18 ENCOUNTER — Other Ambulatory Visit: Payer: Self-pay | Admitting: Internal Medicine

## 2020-05-18 DIAGNOSIS — F39 Unspecified mood [affective] disorder: Secondary | ICD-10-CM

## 2020-05-19 ENCOUNTER — Other Ambulatory Visit: Payer: Self-pay | Admitting: Internal Medicine

## 2020-05-19 DIAGNOSIS — F39 Unspecified mood [affective] disorder: Secondary | ICD-10-CM

## 2020-05-22 MED ORDER — HYDROCODONE-HOMATROPINE 5-1.5 MG/5ML PO SYRP: 5.0000 mL | ORAL_SOLUTION | Freq: Every evening | ORAL | 0 refills | Status: DC | PRN

## 2020-05-22 NOTE — Telephone Encounter (Signed)
Patient called back today She stated she was seen in the office by Dr Alphonsus Sias for her cough Patient is still having the cough., She said when she gets into a coughing fit she has trouble breathing. And would like to know what she should do next   C/B # [phone]

## 2020-05-22 NOTE — Telephone Encounter (Signed)
Advised pt of medication and that it should be about $15 at [facility]. Advised to watch for call from pulmonary. Advised if any symptoms worsen to contact office. Pt appreciative and  verbalized understanding.

## 2020-05-22 NOTE — Telephone Encounter (Signed)
Pt c/o persistent cough. Pt reports cough and wheezing has not gotten any better and she cannot get into pulmonology until 7/15. Pt reports all symptoms are the same as the apt she had on 5/27. Inquired if anything helps. Pt reports the cough syrup helps some at night and that she has only taken it at night. Pt reports she is exhausted from the cough and not being able to get any sleep due to the cough. Pt also reported she could not afford the cough medicine because insurance didn't cover it and it cost $54. Pt also reports she has been using both inhalers, Atrovent and albuterol with no relief. Looked med up on [facility] and [facility] price is $17.06. Pt said she could afford that. Advised this nurse is not sure what Dr. Alphonsus Sias will want to do next but this office will f/u with her. Pt appreciative.  Pt asked for a [facility] card to be mailed.  Mailed card.

## 2020-05-22 NOTE — Addendum Note (Signed)
Addended by: Tillman Abide I on: 05/22/2020 04:55 PM   Modules accepted: Orders

## 2020-05-22 NOTE — Telephone Encounter (Signed)
Please let her know that I sent the prescription for a different cough med--works as well but usually cheaper. [facility] may help with that also I will try to get her pulmonary appt pushed up

## 2020-05-23 ENCOUNTER — Telehealth: Payer: Self-pay

## 2020-05-23 NOTE — Telephone Encounter (Signed)
Pt has been scheduled for 05/29/2020 at 10:30a. Pt is aware and voiced her understanding. Nothing further is needed.

## 2020-05-23 NOTE — Telephone Encounter (Signed)
LM for offer sooner appt then 06/2020. Per Dr. Jayme Cloud, okay to offer 05/29/2020.

## 2020-05-29 ENCOUNTER — Other Ambulatory Visit: Payer: Self-pay

## 2020-05-29 ENCOUNTER — Encounter: Payer: Self-pay | Admitting: Pulmonary Disease

## 2020-05-29 ENCOUNTER — Ambulatory Visit (INDEPENDENT_AMBULATORY_CARE_PROVIDER_SITE_OTHER): Payer: Medicare Other | Admitting: Pulmonary Disease

## 2020-05-29 VITALS — BP 140/80 | HR 84 | Temp 98.8°F | Ht 63.5 in | Wt 262.0 lb

## 2020-05-29 DIAGNOSIS — R05 Cough: Secondary | ICD-10-CM | POA: Diagnosis not present

## 2020-05-29 DIAGNOSIS — R0600 Dyspnea, unspecified: Secondary | ICD-10-CM

## 2020-05-29 DIAGNOSIS — Z6841 Body Mass Index (BMI) 40.0 and over, adult: Secondary | ICD-10-CM

## 2020-05-29 DIAGNOSIS — E662 Morbid (severe) obesity with alveolar hypoventilation: Secondary | ICD-10-CM | POA: Diagnosis not present

## 2020-05-29 DIAGNOSIS — K219 Gastro-esophageal reflux disease without esophagitis: Secondary | ICD-10-CM

## 2020-05-29 DIAGNOSIS — R0609 Other forms of dyspnea: Secondary | ICD-10-CM

## 2020-05-29 DIAGNOSIS — E66813 Obesity, class 3: Secondary | ICD-10-CM

## 2020-05-29 DIAGNOSIS — R059 Cough, unspecified: Secondary | ICD-10-CM

## 2020-05-29 MED ORDER — OMEPRAZOLE 40 MG PO CPDR: 40.0000 mg | DELAYED_RELEASE_CAPSULE | Freq: Every day | ORAL | 2 refills | Status: DC

## 2020-05-29 NOTE — Progress Notes (Signed)
Subjective:    Patient ID: Autumn Price, female    DOB: 1941-02-13, 79 y.o.   MRN: [id number]  HPI Patient is a 79 year old lifelong never smoker, with morbid obesity (BMI 45.7) who presents for evaluation of dyspnea that she has noted for at least 8 months.  She is kindly referred by Dr. Tillman Abide.  She notes that when she is walking she cannot take a deep breath.  She states that she had a knee replacement on the right in April 2019 and had no difficulties at that time after the procedure.  In September 2019 she had her left knee replaced and since that time has had a dry cough which is worse after eating.  Since then she has also had increasing issues with gastroesophageal reflux and heartburn.  She also complains of tachypalpitations, no chest pain.  She has chronic two-pillow orthopnea, no paroxysmal nocturnal dyspnea.  She does get lower extremity edema.  She notes that on 23 April she was treated for "fluid" with "fluid pills" this did not make any change in her breathing status.  She has not had any fevers, chills or sweats.  No other symptomatology.  This feels fatigue, feels that she "cannot go on".  She voices no other complaint.   Review of Systems A 10 point review of systems was performed and it is as noted above otherwise negative.  Past Medical History:  Diagnosis Date  . Depression   . Hypertension   . Osteoarthritis   . Urge incontinence   . Venous insufficiency    Past Surgical History:  Procedure Laterality Date  . APPENDECTOMY  1966  . DILATION AND CURETTAGE OF UTERUS  01/27/14   benign  . JOINT REPLACEMENT     right knee  . TOTAL KNEE ARTHROPLASTY Right 03/20/2018   Procedure: RIGHT TOTAL KNEE ARTHROPLASTY;  Surgeon: Eugenia Mcalpine, MD;  Location: [facility];  Service: Orthopedics;  Laterality: Right;  . TOTAL KNEE ARTHROPLASTY Left 09/11/2018   Procedure: LEFT TOTAL KNEE ARTHROPLASTY;  Surgeon: Eugenia Mcalpine, MD;  Location: [facility];  Service: Orthopedics;   Laterality: Left;  . TUBAL LIGATION  1966   Family History  Problem Relation Age of Onset  . Heart disease Father        died age 55- MI  . Alcohol abuse Brother   . Heart disease Brother   . Heart disease Sister   . Breast cancer Neg Hx    Social History   Tobacco Use  . Smoking status: Never Smoker  . Smokeless tobacco: Never Used  Substance Use Topics  . Alcohol use: No   No Known Allergies  Medications reviewed with patient.     Objective:   Physical Exam BP 140/80 (BP Location: Left Arm, Cuff Size: Normal)   Pulse 84   Temp 98.8 F (37.1 C) (Temporal)   Ht 5' 3.5" (1.613 m)   Wt 262 lb (118.8 kg)   SpO2 96%   BMI 45.68 kg/m   GENERAL: Obese woman, no acute distress, fully ambulatory HEAD: Normocephalic, atraumatic.  EYES: Pupils equal, round, reactive to light.  No scleral icterus.  MOUTH: Nose/mouth/throat not examined due to masking requirements for COVID 19. NECK: Supple. No thyromegaly. Trachea midline. No JVD.  No adenopathy. PULMONARY: Good air entry bilaterally.  No adventitious sounds. CARDIOVASCULAR: S1 and S2. Regular rate and rhythm.  No rubs, murmurs or gallops heard. ABDOMEN: Obese otherwise benign. MUSCULOSKELETAL: No joint deformity, no clubbing, no edema.  NEUROLOGIC: No focal  deficit, no gait disturbance, speech is fluent. SKIN: Intact,warm,dry.  Limited exam shows no rashes PSYCH: Mood appears depressed, behavior normal.  Ambulatory oximetry performed today: Failed to show oxygen desaturations with ambulation.  Most recent chest x-ray performed 02 May 2020, independently reviewed:       Assessment & Plan:     ICD-10-CM   1. DOE (dyspnea on exertion)  R06.00 Pulse oximetry, overnight    Pulmonary Function Test [facility] Only   Suspect multifactorial Query obesity with obesity hypoventilation Will obtain PFTs Recent 2D echo normal  2. Gastroesophageal reflux disease, unspecified whether esophagitis present  K21.9    This may be  contributing to her symptoms Does have hiatal hernia Trial of Prilosec 40 mg daily Antireflux measures  3. Cough  R05 Pulmonary Function Test [facility] Only   May be related to reflux with LPR Prilosec as above  4. Class 3 obesity with alveolar hypoventilation and body mass index (BMI) of 45.0 to 49.9 in adult, unspecified whether serious comorbidity present [facility])  E66.2    Z68.42    This issue adds complexity to her management Suspect this is adding to her symptomatology   Orders Placed This Encounter  Procedures  . Pulse oximetry, overnight    On roomair.    Standing Status:   Future    Standing Expiration Date:   05/29/2021  . Pulmonary Function Test [facility] Only    Standing Status:   Future    Number of Occurrences:   1    Standing Expiration Date:   05/29/2021    Order Specific Question:   Full PFT: includes the following: basic spirometry, spirometry pre & post bronchodilator, diffusion capacity (DLCO), lung volumes    Answer:   Full PFT   Meds ordered this encounter  Medications  . Omeprazole (PRILOSEC) 40 MG capsule    Sig: Take 1 capsule (40 mg total) by mouth daily.    Dispense:  30 capsule    Refill:  2   Discussion:  Add issues with dyspnea on exertion for at least 8 months.  She feels that when she walks she cannot take a deep breath.  This may be related to obesity with obesity hypoventilation.  She has had significant weight gain with BMI over 45 today.  Will obtain PFTs to better delineate potential respiratory defect.  Patient also has significant gastroesophageal reflux symptoms and this may be aggravating her symptoms.  She had CT angio chest on the initial evaluation of this issue that was negative.  2D echo has been negative.  We will see the patient in follow-up to 6 weeks time she is to contact us prior to that time should any new difficulties arise.  Renold Don, MD [facility]   *This note was dictated using voice recognition software/Dragon.  Despite  best efforts to proofread, errors can occur which can change the meaning.  Any change was purely unintentional.

## 2020-05-29 NOTE — Patient Instructions (Addendum)
We are going to obtain breathing tests.  We are also going to get an overnight oximetry test that will tell us about your sleep.  I think your cough is caused by reflux as you do have a hiatal hernia.  We are going to give you a trial of Prilosec 1 capsule daily.  We will see him in follow-up in 4 to 6 weeks time call sooner should any new difficulties arise.

## 2020-06-07 DIAGNOSIS — R0609 Other forms of dyspnea: Secondary | ICD-10-CM | POA: Diagnosis not present

## 2020-06-09 ENCOUNTER — Telehealth: Payer: Self-pay

## 2020-06-09 DIAGNOSIS — G4733 Obstructive sleep apnea (adult) (pediatric): Secondary | ICD-10-CM

## 2020-06-09 NOTE — Telephone Encounter (Signed)
ONO reviewed by Dr. Fredrich Romans 2L QHS and split night sleep study.  Pt is aware of results and voiced her understanding.  Pt wished to proceed with oxygen but would like to hold off on sleep study.  Order has been place for nighttime oxygen.  Nothing further is needed.   Will route to Dr. Jayme Cloud as an FYI/

## 2020-06-09 NOTE — Telephone Encounter (Signed)
Noted  

## 2020-06-12 DIAGNOSIS — I5032 Chronic diastolic (congestive) heart failure: Secondary | ICD-10-CM | POA: Diagnosis not present

## 2020-06-12 DIAGNOSIS — G4733 Obstructive sleep apnea (adult) (pediatric): Secondary | ICD-10-CM | POA: Diagnosis not present

## 2020-06-15 ENCOUNTER — Other Ambulatory Visit: Payer: Self-pay

## 2020-06-15 MED ORDER — TRAMADOL HCL 50 MG PO TABS: ORAL_TABLET | ORAL | 0 refills | Status: DC

## 2020-06-15 NOTE — Telephone Encounter (Signed)
Last filled 05-17-20 #120 Last OV 05-11-20 Next OV 12-27-20 [facility]

## 2020-06-23 ENCOUNTER — Telehealth: Payer: Self-pay

## 2020-06-23 NOTE — Telephone Encounter (Signed)
Pt is aware of date/time of covid test prior to PFT. Pt voiced her understanding.  Nothing further is needed.  

## 2020-06-26 ENCOUNTER — Other Ambulatory Visit
Admission: RE | Admit: 2020-06-26 | Discharge: 2020-06-26 | Disposition: A | Discharge status: Home / Self Care | Payer: Medicare Other | Source: Ambulatory Visit | Attending: Pulmonary Disease | Admitting: Pulmonary Disease

## 2020-06-26 ENCOUNTER — Other Ambulatory Visit: Payer: Self-pay

## 2020-06-26 DIAGNOSIS — Z20822 Contact with and (suspected) exposure to covid-19: Secondary | ICD-10-CM | POA: Diagnosis not present

## 2020-06-26 DIAGNOSIS — Z01812 Encounter for preprocedural laboratory examination: Secondary | ICD-10-CM | POA: Insufficient documentation

## 2020-06-26 LAB — SARS CORONAVIRUS 2 (TAT 6-24 HRS): SARS Coronavirus 2: NEGATIVE

## 2020-06-27 ENCOUNTER — Ambulatory Visit: Payer: Medicare Other | Attending: Pulmonary Disease

## 2020-06-27 DIAGNOSIS — R059 Cough, unspecified: Secondary | ICD-10-CM

## 2020-06-27 DIAGNOSIS — R0600 Dyspnea, unspecified: Secondary | ICD-10-CM | POA: Insufficient documentation

## 2020-06-27 DIAGNOSIS — R05 Cough: Secondary | ICD-10-CM | POA: Diagnosis not present

## 2020-06-27 DIAGNOSIS — R0609 Other forms of dyspnea: Secondary | ICD-10-CM

## 2020-06-27 MED ORDER — ALBUTEROL SULFATE (2.5 MG/3ML) 0.083% IN NEBU
2.5000 mg | INHALATION_SOLUTION | Freq: Once | RESPIRATORY_TRACT | Status: DC
  Filled 2020-06-27: qty 3

## 2020-06-29 ENCOUNTER — Institutional Professional Consult (permissible substitution): Payer: Medicare Other | Admitting: Pulmonary Disease

## 2020-07-06 ENCOUNTER — Other Ambulatory Visit: Payer: Self-pay | Admitting: Internal Medicine

## 2020-07-11 ENCOUNTER — Other Ambulatory Visit: Payer: Self-pay

## 2020-07-11 ENCOUNTER — Encounter: Payer: Self-pay | Admitting: Pulmonary Disease

## 2020-07-11 ENCOUNTER — Ambulatory Visit: Payer: Medicare Other | Admitting: Pulmonary Disease

## 2020-07-11 VITALS — BP 110/64 | HR 76 | Temp 98.1°F | Ht 64.0 in | Wt 261.8 lb

## 2020-07-11 DIAGNOSIS — G4733 Obstructive sleep apnea (adult) (pediatric): Secondary | ICD-10-CM

## 2020-07-11 NOTE — Patient Instructions (Signed)
We are going to make sure that you get a humidifier bottle for your oxygen concentrator.  Use only distilled water in this container.  This should help with your nose dryness.  Continue your efforts at weight loss.  Very proud that you were able to lose 4 pounds! Good job!  We will see you in follow-up in 2 months time call sooner should any new problems arise.

## 2020-07-12 DIAGNOSIS — I5032 Chronic diastolic (congestive) heart failure: Secondary | ICD-10-CM | POA: Diagnosis not present

## 2020-07-12 DIAGNOSIS — G4733 Obstructive sleep apnea (adult) (pediatric): Secondary | ICD-10-CM | POA: Diagnosis not present

## 2020-07-15 ENCOUNTER — Other Ambulatory Visit: Payer: Self-pay | Admitting: Pulmonary Disease

## 2020-07-15 ENCOUNTER — Other Ambulatory Visit: Payer: Self-pay | Admitting: Internal Medicine

## 2020-07-17 NOTE — Telephone Encounter (Signed)
Last office visit 05/11/2020 for DOE/Cough.  Last refilled Tramadol 06/15/2020 for #120 with no refills.

## 2020-07-17 NOTE — Telephone Encounter (Signed)
Ketoconazole cream not on current medication list and states The original prescription was discontinued on 05/02/2020 by Verlee Monte, NP. Renewing this prescription may not be appropriate.  CPE scheduled for 12/28/2019.

## 2020-07-26 DIAGNOSIS — H25013 Cortical age-related cataract, bilateral: Secondary | ICD-10-CM | POA: Diagnosis not present

## 2020-08-12 DIAGNOSIS — I5032 Chronic diastolic (congestive) heart failure: Secondary | ICD-10-CM | POA: Diagnosis not present

## 2020-08-12 DIAGNOSIS — G4733 Obstructive sleep apnea (adult) (pediatric): Secondary | ICD-10-CM | POA: Diagnosis not present

## 2020-08-16 ENCOUNTER — Other Ambulatory Visit: Payer: Self-pay | Admitting: Internal Medicine

## 2020-08-16 NOTE — Telephone Encounter (Signed)
Last filled 07-17-20 #120 Last OV 05-11-20 Next OV 12-27-20 [facility]

## 2020-09-12 DIAGNOSIS — G4733 Obstructive sleep apnea (adult) (pediatric): Secondary | ICD-10-CM | POA: Diagnosis not present

## 2020-09-12 DIAGNOSIS — I5032 Chronic diastolic (congestive) heart failure: Secondary | ICD-10-CM | POA: Diagnosis not present

## 2020-09-15 ENCOUNTER — Other Ambulatory Visit: Payer: Self-pay | Admitting: Internal Medicine

## 2020-09-15 NOTE — Telephone Encounter (Signed)
Last filled 08-16-20 #120 Last OV 05-11-20 Next OV 12-27-20 [facility]

## 2020-09-27 ENCOUNTER — Ambulatory Visit (INDEPENDENT_AMBULATORY_CARE_PROVIDER_SITE_OTHER): Payer: Medicare Other | Admitting: Pulmonary Disease

## 2020-09-27 ENCOUNTER — Encounter: Payer: Self-pay | Admitting: Pulmonary Disease

## 2020-09-27 ENCOUNTER — Other Ambulatory Visit: Payer: Self-pay

## 2020-09-27 VITALS — BP 126/68 | HR 81 | Temp 98.4°F | Ht 63.0 in | Wt 236.6 lb

## 2020-09-27 DIAGNOSIS — R0602 Shortness of breath: Secondary | ICD-10-CM | POA: Diagnosis not present

## 2020-09-27 DIAGNOSIS — K449 Diaphragmatic hernia without obstruction or gangrene: Secondary | ICD-10-CM

## 2020-09-27 DIAGNOSIS — K219 Gastro-esophageal reflux disease without esophagitis: Secondary | ICD-10-CM

## 2020-09-27 DIAGNOSIS — Z9189 Other specified personal risk factors, not elsewhere classified: Secondary | ICD-10-CM

## 2020-09-27 MED ORDER — PANTOPRAZOLE SODIUM 40 MG PO TBEC: 40.0000 mg | DELAYED_RELEASE_TABLET | Freq: Every day | ORAL | 3 refills | Status: DC

## 2020-09-27 NOTE — Progress Notes (Signed)
Subjective:    Patient ID: Autumn Price, female    DOB: 01/31/1941, 79 y.o.   MRN: [id number]  HPI [name] is a 79 year old lifelong never smoker who follows here for the issue of dyspnea and nonproductive cough.  She continues to have issues with dyspnea on exertion.  PFTs more consistent with potential obesity with obesity hypoventilation.  No major issues with obstruction or restriction on PFTs.  She has lost some weight since last visit and feels that maybe this may be helping some.  She has a nonproductive cough and known hiatal hernia.  She initially responded well to Prilosec.  She notes that heartburn has continued to be an issue after several weeks of using Prilosec cough has returned.  She would like to try a different PPI.  She has had some tachycardia palpitations and occasional chest discomfort.  She has not had formal cardiac evaluation.  There is family history of coronary artery disease.  She is on nocturnal oxygen.  A sleep study was ordered however has not been set up.  Will try to obtain a home sleep study for the patient.  She did have very high oxygen desaturation index which may indicate sleep disturbance.   DATA 10/13/2019 2D echo: LVEF 60 to 65%, essentially normal 06/05/2020 overnight oximetry: Shows desaturations to 72% throughout the course of the night with a high oxygen desaturation index/sleep disturbance (24 events per hour) 06/27/2020 PFTs: FEV1 1.86 L or 93% predicted, FVC 2.28 L or 86% predicted.  FEV1/FVC 82%.  No bronchodilator response.  Diffusion capacity not performed despite being ordered.  No obstructive defect.  No restrictive defect  Review of Systems A 10 point review of systems was performed and it is as noted above otherwise negative.  No Known Allergies  Current Meds  Medication Sig  . acetaminophen (TYLENOL) 500 MG tablet Take 500 mg by mouth every 6 (six) hours as needed for mild pain or moderate pain.   Marland Kitchen albuterol (VENTOLIN HFA) 108 (90  Base) MCG/ACT inhaler Inhale 1-2 puffs into the lungs every 6 (six) hours as needed for wheezing or shortness of breath.  Marland Kitchen amLODipine (NORVASC) 5 MG tablet TAKE ONE TABLET BY MOUTH ONCE DAILY  . Fexofenadine-Pseudoephedrine (ALLEGRA-D 24 HOUR PO) Take 1 tablet by mouth daily as needed.  . furosemide (LASIX) 20 MG tablet TAKE ONE TABLET BY MOUTH ONCE DAILY  . ipratropium (ATROVENT HFA) 17 MCG/ACT inhaler Inhale 2 puffs into the lungs 3 (three) times daily as needed for wheezing.  Marland Kitchen ketoconazole (NIZORAL) 2 % cream APPLY 1 APPLICATION TOPICALLY TO GROIN AREA 2 TIMES A DAY AS NEEDED FOR IRRITATION  . omeprazole (PRILOSEC) 40 MG capsule TAKE ONE CAPSULE BY MOUTH ONCE DAILY  . traMADol (ULTRAM) 50 MG tablet TAKE TWO TABLETS BY MOUTH TWICE DAILY AS NEEDED   Immunization History  Administered Date(s) Administered  . Influenza Inj Mdck Quad Pf 10/30/2018, 09/25/2019  . Influenza, Seasonal, Injecte, Preservative Fre 09/30/2016  . Influenza,inj,Quad PF,6+ Mos 09/12/2017  . Pneumococcal Conjugate-13 06/10/2014  . Pneumococcal Polysaccharide-23 01/16/2009, 08/12/2017  . Td 08/21/2010  . Zoster 09/18/2016  . Zoster Recombinat (Shingrix) 09/30/2019, 01/11/2020       Objective:   Physical Exam BP 126/68 (BP Location: Left Arm, Patient Position: Sitting, Cuff Size: Normal)   Pulse 81   Temp 98.4 F (36.9 C) (Temporal)   Ht 5\' 3"  (1.6 m)   Wt 236 lb 9.6 oz (107.3 kg)   SpO2 95%   BMI 41.91 kg/m  GENERAL: Obese woman, no acute distress, fully ambulatory HEAD: Normocephalic, atraumatic.  EYES: Pupils equal, round, reactive to light.  No scleral icterus.  MOUTH: Nose/mouth/throat not examined due to masking requirements for COVID 19. NECK: Supple. No thyromegaly. Trachea midline. No JVD.  No adenopathy. PULMONARY: Good air entry bilaterally.  No adventitious sounds. CARDIOVASCULAR: S1 and S2. Regular rate and rhythm.  No rubs, murmurs or gallops heard. ABDOMEN: Obese otherwise  benign. MUSCULOSKELETAL: No joint deformity, no clubbing, no edema.  NEUROLOGIC: No focal deficit, no gait disturbance, speech is fluent. SKIN: Intact,warm,dry.  Limited exam shows no rashes PSYCH: Mood appropriate, behavior normal.  Ambulatory oximetry was performed today: No oxygen desaturations with ambulation.     Assessment & Plan:     ICD-10-CM   1. Shortness of breath  R06.02 Ambulatory referral to Cardiology   Pulmonary evaluation has been negative thus far Issues with obesity and obesity hypoventilation possibility She is at risk for sleep apnea Cardiology eval  2. At risk for sleep apnea  Z91.89 Home sleep test   Reorder sleep study Continue nocturnal O2  3. Hiatal hernia with GERD  K21.9    K44.9    Only partial relief with Prilosec Change to Protonix  4. Morbid obesity (HCC)  E66.01    Orders Placed This Encounter  Procedures  . Ambulatory referral to Cardiology    Referral Priority:   Routine    Referral Type:   Consultation    Referral Reason:   Specialty Services Required    Requested Specialty:   Cardiology    Number of Visits Requested:   1  . Home sleep test    Standing Status:   Future    Standing Expiration Date:   09/27/2021    Order Specific Question:   Where should this test be performed:    Answer:   [facility] - Pulmonary   Meds ordered this encounter  Medications  . pantoprazole (PROTONIX) 40 MG tablet    Sig: Take 1 tablet (40 mg total) by mouth daily.    Dispense:  30 tablet    Refill:  3   Discussion:  With regards to her dyspnea on exertion she does not exhibit oxygen desaturations with ambulation.  She has lost some weight with perhaps some minimal help in this regard.  PFTs have not been diagnostic.  Will obtain cardiology evaluation to delve into potential cardiac causes of dyspnea.  She does have a dry cough that initially responded to omeprazole.  She notes that omeprazole has quit working.  We will give her a trial of Protonix.  She does  have nocturnal oxygen desaturations and home sleep study has been ordered.  We continue to encourage weight loss.  We will see her in follow-up titers prior to that time should any new difficulties arise.  Gailen Shelter, MD [facility]   *This note was dictated using voice recognition software/Dragon.  Despite best efforts to proofread, errors can occur which can change the meaning.  Any change was purely unintentional.

## 2020-09-27 NOTE — Patient Instructions (Addendum)
Your walking oximetry today was good.  We will refer you to cardiology I want to make sure your heart is checked thoroughly.  We will make sure that a sleep study is ordered to evaluate you for potential sleep apnea.  For your reflux I have switched the prescription to Protonix per your request.  For you Dr. Alphonsus Sias can continue refilling this for you.  You in follow-up in 2 months time.

## 2020-10-06 ENCOUNTER — Encounter: Payer: Self-pay | Admitting: Pulmonary Disease

## 2020-10-08 DIAGNOSIS — G4733 Obstructive sleep apnea (adult) (pediatric): Secondary | ICD-10-CM | POA: Diagnosis not present

## 2020-10-09 ENCOUNTER — Ambulatory Visit (INDEPENDENT_AMBULATORY_CARE_PROVIDER_SITE_OTHER): Payer: Medicare Other | Admitting: Cardiology

## 2020-10-09 ENCOUNTER — Ambulatory Visit: Payer: Medicare Other

## 2020-10-09 ENCOUNTER — Other Ambulatory Visit: Payer: Self-pay

## 2020-10-09 ENCOUNTER — Encounter: Payer: Self-pay | Admitting: Cardiology

## 2020-10-09 VITALS — BP 160/80 | HR 69 | Ht 64.0 in | Wt 266.5 lb

## 2020-10-09 DIAGNOSIS — I10 Essential (primary) hypertension: Secondary | ICD-10-CM

## 2020-10-09 DIAGNOSIS — R0609 Other forms of dyspnea: Secondary | ICD-10-CM

## 2020-10-09 DIAGNOSIS — R0600 Dyspnea, unspecified: Secondary | ICD-10-CM

## 2020-10-09 DIAGNOSIS — Z9189 Other specified personal risk factors, not elsewhere classified: Secondary | ICD-10-CM

## 2020-10-09 NOTE — Progress Notes (Signed)
Cardiology Office Note:    Date:  10/09/2020   ID:  Autumn Price, DOB 12-Aug-1941, MRN [id number]  PCP:  Autumn Schwalbe, MD  [facility] Cardiologist:  Autumn Odea, MD  [facility] Electrophysiologist:  None   Referring MD: Autumn Saner, MD   Chief Complaint  Patient presents with  . OTHER    Sob. Meds reviewed verbally with pt.    History of Present Illness:    Autumn Price is a 79 y.o. female with a hx of hypertension who presents due to shortness of breath.  Patient states having worsening shortness of breath over the past year.  Symptoms are usually associated with exertion.  She denies smoking, denies chest pain.  Her father had an MI in his 44s.  Previously had lower extremity edema which is improved with Lasix.  Patient saw pulmonary medicine, had PFTs done and were more consistent with obesity/obesity hypoventilation. No evidence of obstruction or restriction noted. Echocardiogram 09/2019 showed normal systolic and diastolic function, EF 60 to 65%.  Past Medical History:  Diagnosis Date  . Depression   . Hypertension   . Osteoarthritis   . Urge incontinence   . Venous insufficiency     Past Surgical History:  Procedure Laterality Date  . APPENDECTOMY  1966  . DILATION AND CURETTAGE OF UTERUS  01/27/14   benign  . JOINT REPLACEMENT     right knee  . TOTAL KNEE ARTHROPLASTY Right 03/20/2018   Procedure: RIGHT TOTAL KNEE ARTHROPLASTY;  Surgeon: Eugenia Mcalpine, MD;  Location: [facility];  Service: Orthopedics;  Laterality: Right;  . TOTAL KNEE ARTHROPLASTY Left 09/11/2018   Procedure: LEFT TOTAL KNEE ARTHROPLASTY;  Surgeon: Eugenia Mcalpine, MD;  Location: [facility];  Service: Orthopedics;  Laterality: Left;  . TUBAL LIGATION  1966    Current Medications: Current Meds  Medication Sig  . acetaminophen (TYLENOL) 500 MG tablet Take 500 mg by mouth every 6 (six) hours as needed for mild pain or moderate pain.   Marland Kitchen albuterol (VENTOLIN HFA) 108 (90 Base)  MCG/ACT inhaler Inhale 1-2 puffs into the lungs every 6 (six) hours as needed for wheezing or shortness of breath.  Marland Kitchen amLODipine (NORVASC) 5 MG tablet TAKE ONE TABLET BY MOUTH ONCE DAILY  . Fexofenadine-Pseudoephedrine (ALLEGRA-D 24 HOUR PO) Take 1 tablet by mouth daily as needed.  Marland Kitchen ipratropium (ATROVENT HFA) 17 MCG/ACT inhaler Inhale 2 puffs into the lungs 3 (three) times daily as needed for wheezing.  Marland Kitchen ketoconazole (NIZORAL) 2 % cream APPLY 1 APPLICATION TOPICALLY TO GROIN AREA 2 TIMES A DAY AS NEEDED FOR IRRITATION  . pantoprazole (PROTONIX) 40 MG tablet Take 1 tablet (40 mg total) by mouth daily.  . traMADol (ULTRAM) 50 MG tablet TAKE TWO TABLETS BY MOUTH TWICE DAILY AS NEEDED     Allergies:   Patient has no known allergies.   Social History   Socioeconomic History  . Marital status: Divorced    Spouse name: Not on file  . Number of children: 3  . Years of education: Not on file  . Highest education level: Not on file  Occupational History  . 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 Smoking status: Never Smoker  . Smokeless tobacco: Never Used  Vaping Use  . Vaping Use: Never used  Substance and Sexual Activity  . Alcohol use: No  . Drug use: No  . Sexual activity: Not on file  Other Topics Concern  .  Not on file  Social History Narrative   Has living will   Designated son Ronny Flurry and other children to make health care decisions for her   Would accept CPR/resuscitation   Hasn't considered tube feedings--would leave to son   Social Determinants of Health   Financial Resource Strain:   . Difficulty of Paying Living Expenses: Not on file  Food Insecurity:   . Worried About Programme researcher, broadcasting/film/video in the Last Year: Not on file  . Ran Out of Food in the Last Year: Not on file  Transportation Needs:   . Lack of Transportation (Medical): Not on file  . Lack of Transportation (Non-Medical): Not on file  Physical Activity:    . Days of Exercise per Week: Not on file  . Minutes of Exercise per Session: Not on file  Stress:   . Feeling of Stress : Not on file  Social Connections:   . Frequency of Communication with Friends and Family: Not on file  . Frequency of Social Gatherings with Friends and Family: Not on file  . Attends Religious Services: Not on file  . Active Member of Clubs or Organizations: Not on file  . Attends Banker Meetings: Not on file  . Marital Status: Not on file     Family History: The patient's family history includes Alcohol abuse in her brother; Heart disease in her brother, father, and sister. There is no history of Breast cancer.  ROS:   Please see the history of present illness.     All other systems reviewed and are negative.  EKGs/Labs/Other Studies Reviewed:    The following studies were reviewed today:   EKG:  EKG is  ordered today.  The ekg ordered today demonstrates normal sinus rhythm, normal ECG.  Recent Labs: 05/11/2020: ALT 9; BUN 21; Creatinine, Ser 0.89; Hemoglobin 13.7; Platelets 125.0; Potassium 4.3; Pro B Natriuretic peptide (BNP) 158.0; Sodium 141  Recent Lipid Panel    Component Value Date/Time   CHOL 185 08/09/2016 1037   TRIG 116.0 08/09/2016 1037   HDL 59.10 08/09/2016 1037   CHOLHDL 3 08/09/2016 1037   VLDL 23.2 08/09/2016 1037   LDLCALC 102 (H) 08/09/2016 1037     Risk Assessment/Calculations:      Physical Exam:    VS:  BP (!) 160/80 (BP Location: Left Arm, Patient Position: Sitting, Cuff Size: Normal)   Pulse 69   Ht  (1.626 m)   Wt 266 lb 8 oz (120.9 kg)   SpO2 96%   BMI 45.74 kg/m     Wt Readings from Last 3 Encounters:  10/09/20 266 lb 8 oz (120.9 kg)  09/27/20 236 lb 9.6 oz (107.3 kg)  07/11/20 (!) 261 lb 12.8 oz (118.8 kg)     GEN:  Well nourished, well developed in no acute distress HEENT: Normal NECK: No JVD; No carotid bruits LYMPHATICS: No lymphadenopathy CARDIAC: RRR, no murmurs, rubs,  gallops RESPIRATORY:  Clear to auscultation without rales, wheezing or rhonchi  ABDOMEN: Soft, non-tender, distended MUSCULOSKELETAL:  No edema; No deformity  SKIN: Warm and dry NEUROLOGIC:  Alert and oriented x 3 PSYCHIATRIC:  Normal affect   ASSESSMENT:    1. Dyspnea on exertion   2. Primary hypertension   3. Morbid obesity (HCC)    PLAN:    In order of problems listed above:  1. Patient with worsening dyspnea on exertion.  Has a history of hypertension, family history of early CAD.  Last echo 09/2019 showed  normal systolic and diastolic function, EF 60 to 65%.  Get a Lexiscan Myoview to evaluate for ischemia.  Morbid obesity/deconditioning may be contributing. 2. History of hypertension, BP elevated today, usually controlled.  Continue current BP meds for now.  Keep BP log at home. 3. Patient is morbidly obese, low-calorie diet advised.  If stress test is normal, will refer patient to nutritionist for additional guidance regarding weight loss.  Follow-up after my review.   Medication Adjustments/Labs and Tests Ordered: Current medicines are reviewed at length with the patient today.  Concerns regarding medicines are outlined above.  Orders Placed This Encounter  Procedures  . NM Myocar Multi W/Spect W/Wall Motion / EF  . EKG 12-Lead   No orders of the defined types were placed in this encounter.   Patient Instructions  Medication Instructions:  Your physician recommends that you continue on your current medications as directed. Please refer to the Current Medication list given to you today.  *If you need a refill on your cardiac medications before your next appointment, please call your pharmacy*   Lab Work: none If you have labs (blood work) drawn today and your tests are completely normal, you will receive your results only by: Marland Kitchen MyChart Message (if you have MyChart) OR . A paper copy in the mail If you have any lab test that is abnormal or we need to change your  treatment, we will call you to review the results.   Testing/Procedures: Your physician has requested that you have a lexiscan myoview. For further information please visit [email or web address] Please follow instruction sheet, as given.  [facility] MYOVIEW  Your caregiver has ordered a Stress Test with nuclear imaging. The purpose of this test is to evaluate the blood supply to your heart muscle. This procedure is referred to as a "Non-Invasive Stress Test." This is because other than having an IV started in your vein, nothing is inserted or "invades" your body. Cardiac stress tests are done to find areas of poor blood flow to the heart by determining the extent of coronary artery disease (CAD). Some patients exercise on a treadmill, which naturally increases the blood flow to your heart, while others who are  unable to walk on a treadmill due to physical limitations have a pharmacologic/chemical stress agent called Lexiscan . This medicine will mimic walking on a treadmill by temporarily increasing your coronary blood flow.   Please note: these test may take anywhere between 2-4 hours to complete  PLEASE REPORT TO [facility] MEDICAL MALL ENTRANCE  THE VOLUNTEERS AT THE FIRST DESK WILL DIRECT YOU WHERE TO GO  Date of Procedure:_____________________________________  Arrival Time for Procedure:______________________________   PLEASE NOTIFY THE OFFICE AT LEAST 24 HOURS IN ADVANCE IF YOU ARE UNABLE TO KEEP YOUR APPOINTMENT.  [phone] AND  PLEASE NOTIFY NUCLEAR MEDICINE AT [facility] AT LEAST 24 HOURS IN ADVANCE IF YOU ARE UNABLE TO KEEP YOUR APPOINTMENT. [phone]  How to prepare for your Myoview test:  4. Do not eat or drink after midnight 5. No caffeine for 24 hours prior to test 6. No smoking 24 hours prior to test. 7. Your medication may be taken with water.  If your doctor stopped a medication because of this test, do not take that medication. 8. Ladies, please do not wear dresses.  Skirts or  pants are appropriate. Please wear a short sleeve shirt. 9. No perfume, cologne or lotion. 10. Wear comfortable walking shoes. No heels!    Follow-Up: At [facility], you and your health  needs are our priority.  As part of our continuing mission to provide you with exceptional heart care, we have created designated Provider Care Teams.  These Care Teams include your primary Cardiologist (physician) and Advanced Practice Providers (APPs -  Physician Assistants and Nurse Practitioners) who all work together to provide you with the care you need, when you need it.  We recommend signing up for the patient portal called "MyChart".  Sign up information is provided on this After Visit Summary.  MyChart is used to connect with patients for Virtual Visits (Telemedicine).  Patients are able to view lab/test results, encounter notes, upcoming appointments, etc.  Non-urgent messages can be sent to your provider as well.   To learn more about what you can do with MyChart, go to [email or web address].    Your next appointment:   After testing.  The format for your next appointment:   In Person  Provider:   You may see Autumn Odea, MD or one of the following Advanced Practice Providers on your designated Care Team:    Nicolasa Ducking, NP  Eula Listen, PA-C  Marisue Ivan, PA-C  Cadence Fransico Michael, [place]    Cardiac Nuclear Scan A cardiac nuclear scan is a test that measures blood flow to the heart when a person is resting and when he or she is exercising. The test looks for problems such as:  Not enough blood reaching a portion of the heart.  The heart muscle not working normally. You may need this test if:  You have heart disease.  You have had abnormal lab results.  You have had heart surgery or a balloon procedure to open up blocked arteries (angioplasty).  You have chest pain.  You have shortness of breath. In this test, a radioactive dye (tracer) is injected into your  bloodstream. After the tracer has traveled to your heart, an imaging device is used to measure how much of the tracer is absorbed by or distributed to various areas of your heart. This procedure is usually done at a hospital and takes 2-4 hours. Tell a health care provider about:  Any allergies you have.  All medicines you are taking, including vitamins, herbs, eye drops, creams, and over-the-counter medicines.  Any problems you or family members have had with anesthetic medicines.  Any blood disorders you have.  Any surgeries you have had.  Any medical conditions you have.  Whether you are pregnant or may be pregnant. What are the risks? Generally, this is a safe procedure. However, problems may occur, including:  Serious chest pain and heart attack. This is only a risk if the stress portion of the test is done.  Rapid heartbeat.  Sensation of warmth in your chest. This usually passes quickly.  Allergic reaction to the tracer. What happens before the procedure?  Ask your health care provider about changing or stopping your regular medicines. This is especially important if you are taking diabetes medicines or blood thinners.  Follow instructions from your health care provider about eating or drinking restrictions.  Remove your jewelry on the day of the procedure. What happens during the procedure?  An IV will be inserted into one of your veins.  Your health care provider will inject a small amount of radioactive tracer through the IV.  You will wait for 20-40 minutes while the tracer travels through your bloodstream.  Your heart activity will be monitored with an electrocardiogram (ECG).  You will lie down on an exam table.  Images of your heart  will be taken for about 15-20 minutes.  You may also have a stress test. For this test, one of the following may be done: ? You will exercise on a treadmill or stationary bike. While you exercise, your heart's activity will be  monitored with an ECG, and your blood pressure will be checked. ? You will be given medicines that will increase blood flow to parts of your heart. This is done if you are unable to exercise.  When blood flow to your heart has peaked, a tracer will again be injected through the IV.  After 20-40 minutes, you will get back on the exam table and have more images taken of your heart.  Depending on the type of tracer used, scans may need to be repeated 3-4 hours later.  Your IV line will be removed when the procedure is over. The procedure may vary among health care providers and hospitals. What happens after the procedure?  Unless your health care provider tells you otherwise, you may return to your normal schedule, including diet, activities, and medicines.  Unless your health care provider tells you otherwise, you may increase your fluid intake. This will help to flush the contrast dye from your body. Drink enough fluid to keep your urine pale yellow.  Ask your health care provider, or the department that is doing the test: ? When will my results be ready? ? How will I get my results? Summary  A cardiac nuclear scan measures the blood flow to the heart when a person is resting and when he or she is exercising.  Tell your health care provider if you are pregnant.  Before the procedure, ask your health care provider about changing or stopping your regular medicines. This is especially important if you are taking diabetes medicines or blood thinners.  After the procedure, unless your health care provider tells you otherwise, increase your fluid intake. This will help flush the contrast dye from your body.  After the procedure, unless your health care provider tells you otherwise, you may return to your normal schedule, including diet, activities, and medicines. This information is not intended to replace advice given to you by your health care provider. Make sure you discuss any questions  you have with your health care provider. Document Revised: 05/18/2018 Document Reviewed: 05/18/2018 Elsevier Patient Education  2020 [employer]      Signed, Autumn OdeaBrian Agbor-Etang, MD  10/09/2020 5:02 PM    [facility]

## 2020-10-09 NOTE — Patient Instructions (Signed)
Medication Instructions:  Your physician recommends that you continue on your current medications as directed. Please refer to the Current Medication list given to you today.  *If you need a refill on your cardiac medications before your next appointment, please call your pharmacy*   Lab Work: none If you have labs (blood work) drawn today and your tests are completely normal, you will receive your results only by: Marland Kitchen MyChart Message (if you have MyChart) OR . A paper copy in the mail If you have any lab test that is abnormal or we need to change your treatment, we will call you to review the results.   Testing/Procedures: Your physician has requested that you have a lexiscan myoview. For further information please visit [email or web address] Please follow instruction sheet, as given.  [facility] MYOVIEW  Your caregiver has ordered a Stress Test with nuclear imaging. The purpose of this test is to evaluate the blood supply to your heart muscle. This procedure is referred to as a "Non-Invasive Stress Test." This is because other than having an IV started in your vein, nothing is inserted or "invades" your body. Cardiac stress tests are done to find areas of poor blood flow to the heart by determining the extent of coronary artery disease (CAD). Some patients exercise on a treadmill, which naturally increases the blood flow to your heart, while others who are  unable to walk on a treadmill due to physical limitations have a pharmacologic/chemical stress agent called Lexiscan . This medicine will mimic walking on a treadmill by temporarily increasing your coronary blood flow.   Please note: these test may take anywhere between 2-4 hours to complete  PLEASE REPORT TO [facility] MEDICAL MALL ENTRANCE  THE VOLUNTEERS AT THE FIRST DESK WILL DIRECT YOU WHERE TO GO  Date of Procedure:_____________________________________  Arrival Time for Procedure:______________________________   PLEASE NOTIFY THE OFFICE AT  LEAST 24 HOURS IN ADVANCE IF YOU ARE UNABLE TO KEEP YOUR APPOINTMENT.  [phone] AND  PLEASE NOTIFY NUCLEAR MEDICINE AT [facility] AT LEAST 24 HOURS IN ADVANCE IF YOU ARE UNABLE TO KEEP YOUR APPOINTMENT. [phone]  How to prepare for your Myoview test:  1. Do not eat or drink after midnight 2. No caffeine for 24 hours prior to test 3. No smoking 24 hours prior to test. 4. Your medication may be taken with water.  If your doctor stopped a medication because of this test, do not take that medication. 5. Ladies, please do not wear dresses.  Skirts or pants are appropriate. Please wear a short sleeve shirt. 6. No perfume, cologne or lotion. 7. Wear comfortable walking shoes. No heels!    Follow-Up: At [facility], you and your health needs are our priority.  As part of our continuing mission to provide you with exceptional heart care, we have created designated Provider Care Teams.  These Care Teams include your primary Cardiologist (physician) and Advanced Practice Providers (APPs -  Physician Assistants and Nurse Practitioners) who all work together to provide you with the care you need, when you need it.  We recommend signing up for the patient portal called "MyChart".  Sign up information is provided on this After Visit Summary.  MyChart is used to connect with patients for Virtual Visits (Telemedicine).  Patients are able to view lab/test results, encounter notes, upcoming appointments, etc.  Non-urgent messages can be sent to your provider as well.   To learn more about what you can do with MyChart, go to [email or web address].    Your next  appointment:   After testing.  The format for your next appointment:   In Person  Provider:   You may see Debbe Odea, MD or one of the following Advanced Practice Providers on your designated Care Team:    Nicolasa Ducking, NP  Eula Listen, PA-C  Marisue Ivan, PA-C  Cadence Fransico Michael, [place]    Cardiac Nuclear Scan A cardiac  nuclear scan is a test that measures blood flow to the heart when a person is resting and when he or she is exercising. The test looks for problems such as:  Not enough blood reaching a portion of the heart.  The heart muscle not working normally. You may need this test if:  You have heart disease.  You have had abnormal lab results.  You have had heart surgery or a balloon procedure to open up blocked arteries (angioplasty).  You have chest pain.  You have shortness of breath. In this test, a radioactive dye (tracer) is injected into your bloodstream. After the tracer has traveled to your heart, an imaging device is used to measure how much of the tracer is absorbed by or distributed to various areas of your heart. This procedure is usually done at a hospital and takes 2-4 hours. Tell a health care provider about:  Any allergies you have.  All medicines you are taking, including vitamins, herbs, eye drops, creams, and over-the-counter medicines.  Any problems you or family members have had with anesthetic medicines.  Any blood disorders you have.  Any surgeries you have had.  Any medical conditions you have.  Whether you are pregnant or may be pregnant. What are the risks? Generally, this is a safe procedure. However, problems may occur, including:  Serious chest pain and heart attack. This is only a risk if the stress portion of the test is done.  Rapid heartbeat.  Sensation of warmth in your chest. This usually passes quickly.  Allergic reaction to the tracer. What happens before the procedure?  Ask your health care provider about changing or stopping your regular medicines. This is especially important if you are taking diabetes medicines or blood thinners.  Follow instructions from your health care provider about eating or drinking restrictions.  Remove your jewelry on the day of the procedure. What happens during the procedure?  An IV will be inserted into one  of your veins.  Your health care provider will inject a small amount of radioactive tracer through the IV.  You will wait for 20-40 minutes while the tracer travels through your bloodstream.  Your heart activity will be monitored with an electrocardiogram (ECG).  You will lie down on an exam table.  Images of your heart will be taken for about 15-20 minutes.  You may also have a stress test. For this test, one of the following may be done: ? You will exercise on a treadmill or stationary bike. While you exercise, your heart's activity will be monitored with an ECG, and your blood pressure will be checked. ? You will be given medicines that will increase blood flow to parts of your heart. This is done if you are unable to exercise.  When blood flow to your heart has peaked, a tracer will again be injected through the IV.  After 20-40 minutes, you will get back on the exam table and have more images taken of your heart.  Depending on the type of tracer used, scans may need to be repeated 3-4 hours later.  Your IV line will  be removed when the procedure is over. The procedure may vary among health care providers and hospitals. What happens after the procedure?  Unless your health care provider tells you otherwise, you may return to your normal schedule, including diet, activities, and medicines.  Unless your health care provider tells you otherwise, you may increase your fluid intake. This will help to flush the contrast dye from your body. Drink enough fluid to keep your urine pale yellow.  Ask your health care provider, or the department that is doing the test: ? When will my results be ready? ? How will I get my results? Summary  A cardiac nuclear scan measures the blood flow to the heart when a person is resting and when he or she is exercising.  Tell your health care provider if you are pregnant.  Before the procedure, ask your health care provider about changing or stopping  your regular medicines. This is especially important if you are taking diabetes medicines or blood thinners.  After the procedure, unless your health care provider tells you otherwise, increase your fluid intake. This will help flush the contrast dye from your body.  After the procedure, unless your health care provider tells you otherwise, you may return to your normal schedule, including diet, activities, and medicines. This information is not intended to replace advice given to you by your health care provider. Make sure you discuss any questions you have with your health care provider. Document Revised: 05/18/2018 Document Reviewed: 05/18/2018 Elsevier Patient Education  2020 [employer].

## 2020-10-11 ENCOUNTER — Telehealth: Payer: Self-pay | Admitting: Pulmonary Disease

## 2020-10-11 ENCOUNTER — Ambulatory Visit (INDEPENDENT_AMBULATORY_CARE_PROVIDER_SITE_OTHER): Payer: Medicare Other | Admitting: Internal Medicine

## 2020-10-11 ENCOUNTER — Other Ambulatory Visit: Payer: Self-pay

## 2020-10-11 ENCOUNTER — Encounter: Payer: Self-pay | Admitting: Internal Medicine

## 2020-10-11 VITALS — BP 140/78 | HR 77 | Temp 97.8°F | Ht 65.0 in | Wt 265.0 lb

## 2020-10-11 DIAGNOSIS — G4733 Obstructive sleep apnea (adult) (pediatric): Secondary | ICD-10-CM

## 2020-10-11 DIAGNOSIS — Z23 Encounter for immunization: Secondary | ICD-10-CM | POA: Diagnosis not present

## 2020-10-11 DIAGNOSIS — N3946 Mixed incontinence: Secondary | ICD-10-CM | POA: Diagnosis not present

## 2020-10-11 MED ORDER — MIRABEGRON ER 25 MG PO TB24: 25.0000 mg | ORAL_TABLET | Freq: Every day | ORAL | 11 refills | Status: DC

## 2020-10-11 NOTE — Telephone Encounter (Signed)
Patient is aware of results and voiced her understanding.  Order has been placed for cpap, as patient wished to proceed.  ONO on cpap has been ordered.  Recall has been placed for 70mo.  Nothing further needed.

## 2020-10-11 NOTE — Addendum Note (Signed)
Addended by: Eual Fines on: 10/11/2020 11:28 AM   Modules accepted: Orders

## 2020-10-11 NOTE — Assessment & Plan Note (Signed)
Had bad reaction with oxybutynin Will try mirabegron instead----start with 25mg  but increase if tolerated---to 50mg 

## 2020-10-11 NOTE — Patient Instructions (Signed)
Please start the new medication for the incontinence (mirabegron). Start with 25mg  daily. If you have no problems, but it is not helping much after 2-3 weeks, let me know and I will increase the dose.

## 2020-10-11 NOTE — Telephone Encounter (Signed)
Home sleep study from 10/09/20 showed moderate obstructive sleep apnea with an AHI of 15.1 and SpO2 low of 81%.   Will route to Dr. Jayme Cloud to follow up with patient.

## 2020-10-11 NOTE — Telephone Encounter (Signed)
She will need auto CPAP, set pressure 5-20 with download in 2 weeks.  Do ONOX on CPAP without oxygen.

## 2020-10-11 NOTE — Progress Notes (Signed)
Subjective:    Patient ID: Autumn Price, female    DOB: 12-05-1941, 79 y.o.   MRN: [id number]  HPI Here due to worsening urinary incontinence This visit occurred during the SARS-CoV-2 public health emergency.  Safety protocols were in place, including screening questions prior to the visit, additional usage of staff PPE, and extensive cleaning of exam room while observing appropriate contact time as indicated for disinfecting solutions.   This goes back years but is much worse for the past couple of months Doesn't even feel it--just sitting there--and will soak pad Will happen with cough or sneeze--but doesn't feel No hematuria or dysuria  Had seen urogynecologist a couple of years ago Got oxybutynin and had very bad reaction--headache, etc  Current Outpatient Medications on File Prior to Visit  Medication Sig Dispense Refill  . acetaminophen (TYLENOL) 500 MG tablet Take 500 mg by mouth every 6 (six) hours as needed for mild pain or moderate pain.     Marland Kitchen albuterol (VENTOLIN HFA) 108 (90 Base) MCG/ACT inhaler Inhale 1-2 puffs into the lungs every 6 (six) hours as needed for wheezing or shortness of breath. 18 g 0  . amLODipine (NORVASC) 5 MG tablet TAKE ONE TABLET BY MOUTH ONCE DAILY 30 tablet 11  . Fexofenadine-Pseudoephedrine (ALLEGRA-D 24 HOUR PO) Take 1 tablet by mouth daily as needed.    . furosemide (LASIX) 20 MG tablet TAKE ONE TABLET BY MOUTH ONCE DAILY 30 tablet 11  . ipratropium (ATROVENT HFA) 17 MCG/ACT inhaler Inhale 2 puffs into the lungs 3 (three) times daily as needed for wheezing. 1 Inhaler 0  . ketoconazole (NIZORAL) 2 % cream APPLY 1 APPLICATION TOPICALLY TO GROIN AREA 2 TIMES A DAY AS NEEDED FOR IRRITATION 30 g 3  . pantoprazole (PROTONIX) 40 MG tablet Take 1 tablet (40 mg total) by mouth daily. 30 tablet 3  . traMADol (ULTRAM) 50 MG tablet TAKE TWO TABLETS BY MOUTH TWICE DAILY AS NEEDED 120 tablet 0  . [DISCONTINUED] sertraline (ZOLOFT) 50 MG tablet TAKE 1 AND 1/2  TABLETS BY MOUTH ONCE DAILY 45 tablet 11   Current Facility-Administered Medications on File Prior to Visit  Medication Dose Route Frequency Provider Last Rate Last Admin  . albuterol (PROVENTIL) (2.5 MG/3ML) 0.083% nebulizer solution 2.5 mg  2.5 mg Nebulization Once Salena Saner, MD        No Known Allergies  Past Medical History:  Diagnosis Date  . Depression   . Hypertension   . Osteoarthritis   . Urge incontinence   . Venous insufficiency     Past Surgical History:  Procedure Laterality Date  . APPENDECTOMY  1966  . DILATION AND CURETTAGE OF UTERUS  01/27/14   benign  . JOINT REPLACEMENT     right knee  . TOTAL KNEE ARTHROPLASTY Right 03/20/2018   Procedure: RIGHT TOTAL KNEE ARTHROPLASTY;  Surgeon: Eugenia Mcalpine, MD;  Location: [facility];  Service: Orthopedics;  Laterality: Right;  . TOTAL KNEE ARTHROPLASTY Left 09/11/2018   Procedure: LEFT TOTAL KNEE ARTHROPLASTY;  Surgeon: Eugenia Mcalpine, MD;  Location: [facility];  Service: Orthopedics;  Laterality: Left;  . TUBAL LIGATION  1966    Family History  Problem Relation Age of Onset  . Heart disease Father        died age 43- MI  . Alcohol abuse Brother   . Heart disease Brother   . Heart disease Sister   . Breast cancer Neg Hx     Social History   Socioeconomic History  .  Marital status: Divorced    Spouse name: Not on file  . Number of children: 3  . Years of education: Not on file  . Highest education level: Not on file  Occupational History  . 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 Smoking status: Never Smoker  . Smokeless tobacco: Never Used  Vaping Use  . Vaping Use: Never used  Substance and Sexual Activity  . Alcohol use: No  . Drug use: No  . Sexual activity: Not on file  Other Topics Concern  . Not on file  Social History Narrative   Has living will   Designated son Ronny Flurry and other children to make health care decisions for her   Would  accept CPR/resuscitation   Hasn't considered tube feedings--would leave to son   Social Determinants of Health   Financial Resource Strain:   . Difficulty of Paying Living Expenses: Not on file  Food Insecurity:   . Worried About Programme researcher, broadcasting/film/video in the Last Year: Not on file  . Ran Out of Food in the Last Year: Not on file  Transportation Needs:   . Lack of Transportation (Medical): Not on file  . Lack of Transportation (Non-Medical): Not on file  Physical Activity:   . Days of Exercise per Week: Not on file  . Minutes of Exercise per Session: Not on file  Stress:   . Feeling of Stress : Not on file  Social Connections:   . Frequency of Communication with Friends and Family: Not on file  . Frequency of Social Gatherings with Friends and Family: Not on file  . Attends Religious Services: Not on file  . Active Member of Clubs or Organizations: Not on file  . Attends Banker Meetings: Not on file  . Marital Status: Not on file  Intimate Partner Violence:   . Fear of Current or Ex-Partner: Not on file  . Emotionally Abused: Not on file  . Physically Abused: Not on file  . Sexually Abused: Not on file   Review of Systems No fever No N/V Still with DOE---nothing from pulmonary. Getting stress test next week    Objective:   Physical Exam Constitutional:      Appearance: Normal appearance.  Abdominal:     Palpations: Abdomen is soft.     Tenderness: There is no abdominal tenderness.  Neurological:     Mental Status: She is alert.            Assessment & Plan:

## 2020-10-12 ENCOUNTER — Other Ambulatory Visit: Payer: Self-pay | Admitting: Pulmonary Disease

## 2020-10-12 ENCOUNTER — Other Ambulatory Visit: Payer: Self-pay | Admitting: Internal Medicine

## 2020-10-12 DIAGNOSIS — I5032 Chronic diastolic (congestive) heart failure: Secondary | ICD-10-CM | POA: Diagnosis not present

## 2020-10-12 DIAGNOSIS — G4733 Obstructive sleep apnea (adult) (pediatric): Secondary | ICD-10-CM | POA: Diagnosis not present

## 2020-10-13 NOTE — Telephone Encounter (Signed)
Last filled 09-15-20 #120 Last OV 10-11-20 Next OV 12-27-20 [facility]

## 2020-10-13 NOTE — Progress Notes (Signed)
Noted, thank you

## 2020-10-17 ENCOUNTER — Other Ambulatory Visit: Payer: Self-pay

## 2020-10-17 ENCOUNTER — Encounter
Admission: RE | Admit: 2020-10-17 | Discharge: 2020-10-17 | Disposition: A | Discharge status: Home / Self Care | Payer: Medicare Other | Source: Ambulatory Visit | Attending: Cardiology | Admitting: Cardiology

## 2020-10-17 DIAGNOSIS — R0600 Dyspnea, unspecified: Secondary | ICD-10-CM | POA: Diagnosis not present

## 2020-10-17 DIAGNOSIS — R0609 Other forms of dyspnea: Secondary | ICD-10-CM

## 2020-10-17 LAB — NM MYOCAR MULTI W/SPECT W/WALL MOTION / EF
Peak HR: 88 {beats}/min
Rest HR: 65 {beats}/min

## 2020-10-17 MED ORDER — REGADENOSON 0.4 MG/5ML IV SOLN
0.4000 mg | Freq: Once | INTRAVENOUS | Status: AC
  Administered 2020-10-17: 0.4 mg via INTRAVENOUS
  Filled 2020-10-17: qty 5

## 2020-10-17 MED ORDER — TECHNETIUM TC 99M TETROFOSMIN IV KIT
30.0000 | PACK | Freq: Once | INTRAVENOUS | Status: AC | PRN
  Administered 2020-10-17: 30.803 via INTRAVENOUS

## 2020-10-17 MED ORDER — TECHNETIUM TC 99M TETROFOSMIN IV KIT
10.1800 | PACK | Freq: Once | INTRAVENOUS | Status: AC | PRN
  Administered 2020-10-17: 10.18 via INTRAVENOUS

## 2020-11-08 ENCOUNTER — Telehealth: Payer: Self-pay

## 2020-11-08 NOTE — Telephone Encounter (Signed)
Received letter from Autumn Price stating that they have been unable to reach patient to delivery cpap machine.   Lm for patient.

## 2020-11-11 ENCOUNTER — Other Ambulatory Visit: Payer: Self-pay | Admitting: Internal Medicine

## 2020-11-12 DIAGNOSIS — I5032 Chronic diastolic (congestive) heart failure: Secondary | ICD-10-CM | POA: Diagnosis not present

## 2020-11-12 DIAGNOSIS — G4733 Obstructive sleep apnea (adult) (pediatric): Secondary | ICD-10-CM | POA: Diagnosis not present

## 2020-11-12 NOTE — Telephone Encounter (Signed)
Last filled 10-11-20 #120 Last OV 10-11-20 Next OV 12-27-20 [facility]

## 2020-11-13 NOTE — Telephone Encounter (Signed)
Lm x2 for patient.  Will close encounter per office protocol.   

## 2020-11-15 ENCOUNTER — Other Ambulatory Visit: Payer: Self-pay

## 2020-11-15 ENCOUNTER — Telehealth: Payer: Self-pay

## 2020-11-15 ENCOUNTER — Encounter: Payer: Self-pay | Admitting: Emergency Medicine

## 2020-11-15 ENCOUNTER — Ambulatory Visit
Admission: EM | Admit: 2020-11-15 | Discharge: 2020-11-15 | Disposition: A | Discharge status: Home / Self Care | Payer: Medicare Other | Attending: Emergency Medicine | Admitting: Emergency Medicine

## 2020-11-15 DIAGNOSIS — R5383 Other fatigue: Secondary | ICD-10-CM | POA: Insufficient documentation

## 2020-11-15 DIAGNOSIS — J019 Acute sinusitis, unspecified: Secondary | ICD-10-CM | POA: Diagnosis not present

## 2020-11-15 DIAGNOSIS — Z20822 Contact with and (suspected) exposure to covid-19: Secondary | ICD-10-CM | POA: Insufficient documentation

## 2020-11-15 DIAGNOSIS — R059 Cough, unspecified: Secondary | ICD-10-CM | POA: Diagnosis not present

## 2020-11-15 DIAGNOSIS — R062 Wheezing: Secondary | ICD-10-CM | POA: Insufficient documentation

## 2020-11-15 DIAGNOSIS — Z7901 Long term (current) use of anticoagulants: Secondary | ICD-10-CM | POA: Insufficient documentation

## 2020-11-15 DIAGNOSIS — Z79899 Other long term (current) drug therapy: Secondary | ICD-10-CM | POA: Diagnosis not present

## 2020-11-15 DIAGNOSIS — I110 Hypertensive heart disease with heart failure: Secondary | ICD-10-CM | POA: Diagnosis not present

## 2020-11-15 DIAGNOSIS — I5032 Chronic diastolic (congestive) heart failure: Secondary | ICD-10-CM | POA: Diagnosis not present

## 2020-11-15 LAB — RESP PANEL BY RT-PCR (FLU A&B, COVID) ARPGX2
Influenza A by PCR: NEGATIVE
Influenza B by PCR: NEGATIVE
SARS Coronavirus 2 by RT PCR: NEGATIVE

## 2020-11-15 MED ORDER — AMOXICILLIN-POT CLAVULANATE 875-125 MG PO TABS: 1.0000 | ORAL_TABLET | Freq: Two times a day (BID) | ORAL | 0 refills | Status: AC

## 2020-11-15 MED ORDER — ALBUTEROL SULFATE HFA 108 (90 BASE) MCG/ACT IN AERS
1.0000 | INHALATION_SPRAY | Freq: Four times a day (QID) | RESPIRATORY_TRACT | 0 refills | Status: DC | PRN
Start: 2020-11-15 — End: 2021-06-22

## 2020-11-15 NOTE — Telephone Encounter (Signed)
Diagnosed with sinusitis Flu and COVID negative Please check on her tomorrow

## 2020-11-15 NOTE — Telephone Encounter (Signed)
[facility] - Client TELEPHONE ADVICE RECORD [facility]urse Patient Name: BREANNAH KRATT Gender: Female DOB: August 22, 1941 Age: 79 Y 3 M 6 D Return Phone Number: [phone] (Primary) Address: City/State/Zip: [place] [address] Client [facility] - Client Client Site [facility] - Day Physician Tillman Abide- MD Contact Type Call Who Is Calling Patient / Member / Family / Caregiver Call Type Triage / Clinical Relationship To Patient Self Return Phone Number [phone] (Primary) Chief Complaint BREATHING - fast, heavy or wheezing Reason for Call Symptomatic / Request for Health Information Initial Comment Caller states she's calling from [facility], and her name is Irving Burton and saying that patient, she's been having wheezing, significant sinus drainage and congestion, and she's an older lady. Doctor doesn't have any openings. GOTO Facility Not Listed UrgentCare in [employer] Translation No Nurse Assessment Nurse: Mayford Knife, RN, Whitney Date/Time (Eastern Time): 11/15/2020 10:42:07 AM Confirm and document reason for call. If symptomatic, describe symptoms. ---Caller states she's been having wheezing for the last 3-4 days, significant sinus drainage for over a week and congestion. Denies fever. Does the patient have any new or worsening symptoms? ---Yes Will a triage be completed? ---Yes Related visit to physician within the last 2 weeks? ---No Does the PT have any chronic conditions? (i.e. diabetes, asthma, this includes High risk factors for pregnancy, etc.) ---Yes List chronic conditions. ---hypertension Is this a behavioral health or substance abuse call? ---No Guidelines Guideline Title Affirmed Question Affirmed Notes Nurse Date/Time (Eastern Time) COVID-19 - Diagnosed or Suspected HIGH RISK for severe COVID complications (e.g., age > 64 years, obesity with BMI > 25, pregnant, chronic  lung disease or other chronic medical condition) (Exception: Already seen Mayford Knife, RN, Whitney 11/15/2020 10:43:09 AM PLEASE NOTE: All timestamps contained within this report are represented as [place] Standard Time. CONFIDENTIALTY NOTICE: This fax transmission is intended only for the addressee. It contains information that is legally privileged, confidential or otherwise protected from use or disclosure. If you are not the intended recipient, you are strictly prohibited from reviewing, disclosing, copying using or disseminating any of this information or taking any action in reliance on or regarding this information. If you have received this fax in error, please notify us immediately by telephone so that we can arrange for its return to [place]. Phone: [phone], Toll-Free: [phone], Fax: [phone] Page: 2 of 2 Call Id: [id number] Guidelines Guideline Title Affirmed Question Affirmed Notes Nurse Date/Time [name] Time) by PCP and no new or worsening symptoms.) Disp. Time [name] Time) Disposition Final User 11/15/2020 10:40:22 AM Send to Urgent Queue [place] 11/15/2020 10:47:27 AM Go to ED Now (or PCP triage) Yes Mayford Knife, RN, Whitney Disposition Overriden: Call PCP Now Override Reason: Patient's symptoms need a higher level of care Caller Disagree/Comply Comply Caller Understands Yes PreDisposition InappropriateToAsk Care Advice Given Per Guideline * IF NO PCP (PRIMARY CARE PROVIDER) SECOND-LEVEL TRIAGE: You need to be seen within the next hour. Go to the ED/UCC at _____________ Hospital. Leave as soon as you can. Comments User: Hardie Lora, RN Date/Time [name] Time): 11/15/2020 10:48:27 AM RN upgraded triage outcome due symptoms and due to no appointments in the office per staff who transferred caller. Referrals GO TO FACILITY OTHER - SPECIFY

## 2020-11-15 NOTE — Telephone Encounter (Signed)
Per chart review tab pt is presently at [facility] in [place].

## 2020-11-15 NOTE — Discharge Instructions (Addendum)
Your flu and Covid test were negative. Take Coricidin HBP during the day and your at home Tussionex at bedtime for cough. Increase rest and fluids. Use your albuterol inhaler that I have refilled as needed for wheezing. I have sent an antibiotic for you to try if you are not feeling better in the next couple of days. This should cover you for sinus infection. Follow-up with your PCP if your not feeling better over the next few days. Go to emergency department for any fevers, worsening cough, increased wheezing or breathing difficulty or chest pain.

## 2020-11-15 NOTE — ED Provider Notes (Signed)
[facility]    CSN: [id number] Arrival date & time: 11/15/20  1113      History   Chief Complaint Chief Complaint  Patient presents with  . Cough    HPI Autumn Price is a 79 y.o. female presenting for 1 week history of nasal congestion, post nasal drainage, cough that is occasionally productive of yellow phlegm, and "wheezing while coughing." She says symptoms are worse over the past 3 days.  Patient says she has been taking over-the-counter Mucinex and thinks it does help when she takes it.  She denies any fever.  She does admit to some fatigue but denies body aches.  She denies and any chest discomfort or breathing difficulty.  Patient states she has seen a pulmonologist about wheezing in the past.  She says that a couple months ago she had a nuclear stress test which was normal.  She does have a history of heart failure and sleep apnea.  Patient denies any increased swelling in the legs.  Patient says she has albuterol inhaler, but she notes not sure she is using it correctly.  So she does need a refill of this inhaler.  Patient denies any known Covid exposure and is not vaccinated for Covid.  She denies any other complaints or concerns.  HPI  Past Medical History:  Diagnosis Date  . Depression   . Hypertension   . Osteoarthritis   . Urge incontinence   . Venous insufficiency     Patient Active Problem List   Diagnosis Date Noted  . Persistent cough 05/11/2020  . Chronic diastolic heart failure (HCC) 10/25/2019  . DOE (dyspnea on exertion) 10/11/2019  . Acute non-recurrent frontal sinusitis 07/28/2019  . Preoperative evaluation to rule out surgical contraindication 07/13/2018  . Fatigue 04/24/2018  . Primary osteoarthritis of right knee 03/20/2018  . S/P knee replacement 03/20/2018  . Osteoarthritis of left knee 08/30/2016  . Advance directive discussed with patient 08/04/2015  . Mood disorder (HCC) 06/10/2014  . Morbid obesity (HCC) 05/22/2012  . Routine  general medical examination at a health care facility 03/19/2011  . Mixed stress and urge urinary incontinence 03/19/2011  . Essential hypertension, benign 08/21/2010  . Chronic venous insufficiency 08/21/2010  . Osteoarthritis, multiple sites 08/21/2010    Past Surgical History:  Procedure Laterality Date  . APPENDECTOMY  1966  . DILATION AND CURETTAGE OF UTERUS  01/27/14   benign  . JOINT REPLACEMENT     right knee  . TOTAL KNEE ARTHROPLASTY Right 03/20/2018   Procedure: RIGHT TOTAL KNEE ARTHROPLASTY;  Surgeon: Eugenia Mcalpine, MD;  Location: [facility];  Service: Orthopedics;  Laterality: Right;  . TOTAL KNEE ARTHROPLASTY Left 09/11/2018   Procedure: LEFT TOTAL KNEE ARTHROPLASTY;  Surgeon: Eugenia Mcalpine, MD;  Location: [facility];  Service: Orthopedics;  Laterality: Left;  . TUBAL LIGATION  1966    OB History   No obstetric history on file.      Home Medications    Prior to Admission medications   Medication Sig Start Date End Date Taking? Authorizing Provider  acetaminophen (TYLENOL) 500 MG tablet Take 500 mg by mouth every 6 (six) hours as needed for mild pain or moderate pain.    Yes [provider]  amLODipine (NORVASC) 5 MG tablet TAKE ONE TABLET BY MOUTH ONCE DAILY 07/17/20  Yes Karie Schwalbe, MD  Fexofenadine-Pseudoephedrine (ALLEGRA-D 24 HOUR PO) Take 1 tablet by mouth daily as needed.   Yes [provider]  furosemide (LASIX) 20 MG  tablet TAKE ONE TABLET BY MOUTH ONCE DAILY 01/21/20  Yes Karie Schwalbe, MD  mirabegron ER (MYRBETRIQ) 25 MG TB24 tablet Take 1 tablet (25 mg total) by mouth daily. 10/11/20  Yes Karie Schwalbe, MD  pantoprazole (PROTONIX) 40 MG tablet Take 1 tablet (40 mg total) by mouth daily. 09/27/20 01/25/21 Yes Salena Saner, MD  traMADol (ULTRAM) 50 MG tablet TAKE TWO TABLETS BY MOUTH TWICE DAILY AS NEEDED 11/13/20  Yes Karie Schwalbe, MD  albuterol (VENTOLIN HFA) 108 (90 Base) MCG/ACT inhaler Inhale 1-2 puffs into the lungs  every 6 (six) hours as needed for wheezing or shortness of breath. 11/15/20 12/15/20  Eusebio Friendly B, PA-C  amoxicillin-clavulanate (AUGMENTIN) 875-125 MG tablet Take 1 tablet by mouth every 12 (twelve) hours for 7 days. 11/15/20 11/22/20  Eusebio Friendly B, PA-C  ipratropium (ATROVENT HFA) 17 MCG/ACT inhaler Inhale 2 puffs into the lungs 3 (three) times daily as needed for wheezing. 05/02/20   Verlee Monte, NP  ketoconazole (NIZORAL) 2 % cream APPLY 1 APPLICATION TOPICALLY TO GROIN AREA 2 TIMES A DAY AS NEEDED FOR IRRITATION 10/13/20   Karie Schwalbe, MD  sertraline (ZOLOFT) 50 MG tablet TAKE 1 AND 1/2 TABLETS BY MOUTH ONCE DAILY 05/24/19 05/02/20  Karie Schwalbe, MD    Family History Family History  Problem Relation Age of Onset  . Heart disease Father        died age 65- MI  . Alcohol abuse Brother   . Heart disease Brother   . Heart disease Sister   . Breast cancer Neg Hx     Social History Social History   Tobacco Use  . Smoking status: Never Smoker  . Smokeless tobacco: Never Used  Vaping Use  . Vaping Use: Never used  Substance Use Topics  . Alcohol use: No  . Drug use: No     Allergies   Patient has no known allergies.   Review of Systems Review of Systems  Constitutional: Negative for chills, diaphoresis, fatigue and fever.  HENT: Positive for congestion, postnasal drip, rhinorrhea and sinus pressure. Negative for ear pain, sinus pain and sore throat.   Respiratory: Positive for cough and wheezing. Negative for shortness of breath.   Gastrointestinal: Negative for abdominal pain, nausea and vomiting.  Musculoskeletal: Negative for arthralgias and myalgias.  Skin: Negative for rash.  Neurological: Negative for weakness and headaches.  Hematological: Negative for adenopathy.     Physical Exam Triage Vital Signs ED Triage Vitals  Enc Vitals Group     BP 11/15/20 1133 (!) 167/70     Pulse Rate 11/15/20 1133 72     Resp 11/15/20 1133 18     Temp 11/15/20  1133 98.5 F (36.9 C)     Temp Source 11/15/20 1133 Oral     SpO2 11/15/20 1133 97 %     Weight 11/15/20 1131 264 lb 15.9 oz (120.2 kg)     Height 11/15/20 1131 5\' 5"  (1.651 m)     Head Circumference --      Peak Flow --      Pain Score 11/15/20 1131 0     Pain Loc --      Pain Edu? --      Excl. in GC? --    No data found.  Updated Vital Signs BP (!) 167/70 (BP Location: Right Arm)   Pulse 72   Temp 98.5 F (36.9 C) (Oral)   Resp 18   Ht 5\' 5"  (  1.651 m)   Wt 264 lb 15.9 oz (120.2 kg)   SpO2 97%   BMI 44.10 kg/m       Physical Exam Vitals and nursing note reviewed.  Constitutional:      General: She is not in acute distress.    Appearance: Normal appearance. She is not ill-appearing or toxic-appearing.  HENT:     Head: Normocephalic and atraumatic.     Right Ear: Tympanic membrane, ear canal and external ear normal.     Left Ear: Tympanic membrane, ear canal and external ear normal.     Nose: Congestion and rhinorrhea present.     Mouth/Throat:     Mouth: Mucous membranes are moist.     Pharynx: Oropharynx is clear. Posterior oropharyngeal erythema (mild with clear rhinorrhea) present.  Eyes:     General: No scleral icterus.       Right eye: No discharge.        Left eye: No discharge.     Conjunctiva/sclera: Conjunctivae normal.  Cardiovascular:     Rate and Rhythm: Normal rate and regular rhythm.     Heart sounds: Normal heart sounds.  Pulmonary:     Effort: Pulmonary effort is normal. No respiratory distress.     Breath sounds: Normal breath sounds. No wheezing, rhonchi or rales.  Musculoskeletal:     Cervical back: Neck supple.     Right lower leg: No edema.     Left lower leg: No edema.  Skin:    General: Skin is dry.  Neurological:     General: No focal deficit present.     Mental Status: She is alert. Mental status is at baseline.     Motor: No weakness.     Gait: Gait normal.  Psychiatric:        Mood and Affect: Mood normal.        Behavior:  Behavior normal.        Thought Content: Thought content normal.      UC Treatments / Results  Labs (all labs ordered are listed, but only abnormal results are displayed) Labs Reviewed  RESP PANEL BY RT-PCR (FLU A&B, COVID) [id number]    EKG   Radiology No results found.  Procedures Procedures (including critical care time)  Medications Ordered in UC Medications - No data to display  Initial Impression / Assessment and Plan / UC Course  I have reviewed the triage vital signs and the nursing notes.  Pertinent labs & imaging results that were available during my care of the patient were reviewed by me and considered in my medical decision making (see chart for details).    79 year old female presenting for 1 week history of sinus and nasal congestion, cough and using which is been worse over the past 3 days.  In the clinic her blood pressure is elevated 167/70.  All other vital signs are normal and stable.  She is afebrile and oxygen is 97%.  On exam, she has mild nasal congestion and clear rhinorrhea with mild posterior pharyngeal erythema and clear postnasal drainage.  Her chest is clear to auscultation.  There is no pitting edema of the lower extremities.  Advised patient we could obtain a chest x-ray she was concerned about the wheezing, but her chest is clear when I am listening and she is afebrile so I am not really concerned about pneumonia.  Increased leg swelling so I doubt she is having any exacerbation of her CHF.  Patient declines chest x-ray at this  time.  Respiratory panel obtained and is negative for flu and Covid.  Discussed results with patient.  Advised her that she most likely does viral sinusitis.  Advised that if she is not feeling better in the next 3 days she can start Augmentin.  I did refill the albuterol inhaler and demonstrated how to use the inhaler.  Advised her to increase rest and fluids.  Advised to take Coricidin HBP during the day and to her at home  Tussionex from the previous visit at bedtime as needed.  Advised to follow with PCP next week for recheck.  ED precautions thoroughly reviewed with patient for any new or worsening symptoms including fever, worsening cough, increased wheezing or breathing difficulty, or swelling assessment increased leg strength   Final Clinical Impressions(s) / UC Diagnoses   Final diagnoses:  Acute sinusitis, recurrence not specified, unspecified location  Cough  Wheezing     Discharge Instructions     Your flu and Covid test were negative. Take Coricidin HBP during the day and your at home Tussionex at bedtime for cough. Increase rest and fluids. Use your albuterol inhaler that I have refilled as needed for wheezing. I have sent an antibiotic for you to try if you are not feeling better in the next couple of days. This should cover you for sinus infection. Follow-up with your PCP if your not feeling better over the next few days. Go to emergency department for any fevers, worsening cough, increased wheezing or breathing difficulty or chest pain.    ED Prescriptions    Medication Sig Dispense Auth. Provider   albuterol (VENTOLIN HFA) 108 (90 Base) MCG/ACT inhaler Inhale 1-2 puffs into the lungs every 6 (six) hours as needed for wheezing or shortness of breath. 18 g Eusebio Friendly B, PA-C   amoxicillin-clavulanate (AUGMENTIN) 875-125 MG tablet Take 1 tablet by mouth every 12 (twelve) hours for 7 days. 14 tablet Gareth Morgan     PDMP not reviewed this encounter.   Shirlee Latch, PA-C 11/15/20 1319

## 2020-11-15 NOTE — ED Triage Notes (Signed)
Pt c/o cough, wheezing, post nasal drainage. Started about 3 days ago. Denies fever. She has not had covid vaccines.

## 2020-11-16 NOTE — Telephone Encounter (Signed)
Spoke to pt. She said the wheezing cleared before she got to the UC. Lungs were clear. Taking meds as prescribed. Feeling a little better today.

## 2020-12-12 ENCOUNTER — Other Ambulatory Visit: Payer: Self-pay | Admitting: Internal Medicine

## 2020-12-12 DIAGNOSIS — I5032 Chronic diastolic (congestive) heart failure: Secondary | ICD-10-CM | POA: Diagnosis not present

## 2020-12-12 DIAGNOSIS — G4733 Obstructive sleep apnea (adult) (pediatric): Secondary | ICD-10-CM | POA: Diagnosis not present

## 2020-12-13 NOTE — Telephone Encounter (Signed)
Last filled 11-13-20 #120 Last OV 10-11-20 Next OV 12-27-20 [facility]

## 2020-12-27 ENCOUNTER — Encounter: Payer: Self-pay | Admitting: Internal Medicine

## 2020-12-27 ENCOUNTER — Encounter (INDEPENDENT_AMBULATORY_CARE_PROVIDER_SITE_OTHER): Payer: Self-pay

## 2020-12-27 ENCOUNTER — Ambulatory Visit (INDEPENDENT_AMBULATORY_CARE_PROVIDER_SITE_OTHER): Payer: Medicare Other | Admitting: Internal Medicine

## 2020-12-27 ENCOUNTER — Other Ambulatory Visit: Payer: Self-pay

## 2020-12-27 VITALS — BP 138/72 | HR 68 | Temp 97.3°F | Ht 63.0 in | Wt 262.0 lb

## 2020-12-27 DIAGNOSIS — I5032 Chronic diastolic (congestive) heart failure: Secondary | ICD-10-CM

## 2020-12-27 DIAGNOSIS — M8949 Other hypertrophic osteoarthropathy, multiple sites: Secondary | ICD-10-CM | POA: Diagnosis not present

## 2020-12-27 DIAGNOSIS — F39 Unspecified mood [affective] disorder: Secondary | ICD-10-CM

## 2020-12-27 DIAGNOSIS — I10 Essential (primary) hypertension: Secondary | ICD-10-CM | POA: Diagnosis not present

## 2020-12-27 DIAGNOSIS — M159 Polyosteoarthritis, unspecified: Secondary | ICD-10-CM

## 2020-12-27 DIAGNOSIS — Z7189 Other specified counseling: Secondary | ICD-10-CM

## 2020-12-27 DIAGNOSIS — Z0000 Encounter for general adult medical examination without abnormal findings: Secondary | ICD-10-CM

## 2020-12-27 DIAGNOSIS — N3946 Mixed incontinence: Secondary | ICD-10-CM

## 2020-12-27 LAB — RENAL FUNCTION PANEL
Albumin: 4.7 g/dL (ref 3.5–5.2)
BUN: 14 mg/dL (ref 6–23)
CO2: 30 mEq/L (ref 19–32)
Calcium: 9.9 mg/dL (ref 8.4–10.5)
Chloride: 102 mEq/L (ref 96–112)
Creatinine, Ser: 0.96 mg/dL (ref 0.40–1.20)
GFR: 57.12 mL/min — ABNORMAL LOW (ref >60.00)
Glucose, Bld: 103 mg/dL — ABNORMAL HIGH (ref 70–99)
Phosphorus: 3.2 mg/dL (ref 2.3–4.6)
Potassium: 4.2 mEq/L (ref 3.5–5.1)
Sodium: 138 mEq/L (ref 135–145)

## 2020-12-27 LAB — CBC
HCT: 44.3 % (ref 36.0–46.0)
Hemoglobin: 14.5 g/dL (ref 12.0–15.0)
MCHC: 32.6 g/dL (ref 30.0–36.0)
MCV: 85.9 fl (ref 78.0–100.0)
Platelets: 144 10*3/uL — ABNORMAL LOW (ref 150.0–400.0)
RBC: 5.16 Mil/uL — ABNORMAL HIGH (ref 3.87–5.11)
RDW: 14.8 % (ref 11.5–15.5)
WBC: 5.3 10*3/uL (ref 4.0–10.5)

## 2020-12-27 LAB — HEPATIC FUNCTION PANEL
ALT: 10 U/L (ref 0–35)
AST: 16 U/L (ref 0–37)
Albumin: 4.7 g/dL (ref 3.5–5.2)
Alkaline Phosphatase: 101 U/L (ref 39–117)
Bilirubin, Direct: 0.1 mg/dL (ref 0.0–0.3)
Total Bilirubin: 0.8 mg/dL (ref 0.2–1.2)
Total Protein: 7.9 g/dL (ref 6.0–8.3)

## 2020-12-27 MED ORDER — MIRABEGRON ER 50 MG PO TB24: 50.0000 mg | ORAL_TABLET | Freq: Every day | ORAL | 3 refills | Status: DC

## 2020-12-27 NOTE — Assessment & Plan Note (Signed)
BP Readings from Last 3 Encounters:  12/27/20 138/72  11/15/20 (!) 167/70  10/11/20 140/78   Good control on amlodipine

## 2020-12-27 NOTE — Assessment & Plan Note (Signed)
I have personally reviewed the Medicare Annual Wellness questionnaire and have noted 1. The patient's medical and social history 2. Their use of alcohol, tobacco or illicit drugs 3. Their current medications and supplements 4. The patient's functional ability including ADL's, fall risks, home safety risks and hearing or visual             impairment. 5. Diet and physical activities 6. Evidence for depression or mood disorders  The patients weight, height, BMI and visual acuity have been recorded in the chart I have made referrals, counseling and provided education to the patient based review of the above and I have provided the pt with a written personalized care plan for preventive services.  I have provided you with a copy of your personalized plan for preventive services. Please take the time to review along with your updated medication list.  Done with cancer screening Urged her to get COVID vaccine Had flu vaccine Td if any injury Asked her to try to exercise

## 2020-12-27 NOTE — Patient Instructions (Signed)

## 2020-12-27 NOTE — Progress Notes (Signed)
Hearing Screening   Method: Audiometry   125Hz  250Hz  500Hz  1000Hz  2000Hz  3000Hz  4000Hz  6000Hz  8000Hz   Right ear:   40 40 0  0    Left ear:   40 40 0  0    Vision Screening Comments: August 2021

## 2020-12-27 NOTE — Assessment & Plan Note (Signed)
BMI still 46 Will give DASH info

## 2020-12-27 NOTE — Assessment & Plan Note (Signed)
Not much edema but may be part of her ongoing DOE Uses the furosemide prn and hasn't needed lately Stress test benign Does have OSA--awaiting CPAP

## 2020-12-27 NOTE — Assessment & Plan Note (Signed)
Uses the tramadol mostly at night Asked her to take the tylenol more regularly

## 2020-12-27 NOTE — Assessment & Plan Note (Signed)
Some response from the myrbetriq Will try the 50mg  dose

## 2020-12-27 NOTE — Assessment & Plan Note (Signed)
This is better Off the sertraline now

## 2020-12-27 NOTE — Progress Notes (Signed)
Subjective:    Patient ID: Autumn Price, female    DOB: 1941-06-08, 80 y.o.   MRN: [id number]  HPI Here for Medicare wellness visit and follow up of chronic health conditions This visit occurred during the SARS-CoV-2 public health emergency.  Safety protocols were in place, including screening questions prior to the visit, additional usage of staff PPE, and extensive cleaning of exam room while observing appropriate contact time as indicated for disinfecting solutions.   Reviewed form and advanced directives Reviewed other doctors No alcohol or tobacco Not exercising still---discussed Vision and hearing are fine Fell once---tripped on steps. No injury No hospitalizations or surgery Still drives and does all instrumental ADLs No memory issues of concern  Did notice a difference at first with myrbetriq then it waned No apparent side effects BP 130/68 last month by home NP  Same DOE--can only walk flat for 25-50 yards Has seen Dr Queen Blossom doesn't do anything" Still with night cough---spits up stuff Did have home sleep study---- has OSA but never got the CPAP machine Stress test was negative No chest pain Occasionally feels heart "beat in my throat"---not often and lasts seconds only No dizziness or syncope Hasn't used the furosemide lately--not having swelling  Sleeps in bed with wedge. No PND Home BP varies between 127/76-168/87. When high, she will recheck it and it comes down  She stopped the sertraline Mood is better Feels like she is over the grieving, etc Able to enjoy things  BMI stable at about 46 No major change over the past year Tries to limit what she eats. Cooks food  Ongoing hand arthritis Some worsening---especially left 5th MCP Uses tylenol rarely---tramadol mostly at bedtime  Current Outpatient Medications on File Prior to Visit  Medication Sig Dispense Refill  . acetaminophen (TYLENOL) 500 MG tablet Take 500 mg by mouth every 6 (six) hours as  needed for mild pain or moderate pain.    Marland Kitchen amLODipine (NORVASC) 5 MG tablet TAKE ONE TABLET BY MOUTH ONCE DAILY 30 tablet 11  . Fexofenadine-Pseudoephedrine (ALLEGRA-D 24 HOUR PO) Take 1 tablet by mouth daily as needed.    . furosemide (LASIX) 20 MG tablet TAKE ONE TABLET BY MOUTH ONCE DAILY 30 tablet 11  . ipratropium (ATROVENT HFA) 17 MCG/ACT inhaler Inhale 2 puffs into the lungs 3 (three) times daily as needed for wheezing. 1 Inhaler 0  . ketoconazole (NIZORAL) 2 % cream APPLY 1 APPLICATION TOPICALLY TO GROIN AREA 2 TIMES A DAY AS NEEDED FOR IRRITATION 30 g 3  . pantoprazole (PROTONIX) 40 MG tablet Take 1 tablet (40 mg total) by mouth daily. 30 tablet 3  . traMADol (ULTRAM) 50 MG tablet TAKE TWO TABLETS BY MOUTH TWICE DAILY AS NEEDED 120 tablet 0  . [DISCONTINUED] mirabegron ER (MYRBETRIQ) 25 MG TB24 tablet Take 1 tablet (25 mg total) by mouth daily. 30 tablet 11  . albuterol (VENTOLIN HFA) 108 (90 Base) MCG/ACT inhaler Inhale 1-2 puffs into the lungs every 6 (six) hours as needed for wheezing or shortness of breath. 18 g 0  . [DISCONTINUED] sertraline (ZOLOFT) 50 MG tablet TAKE 1 AND 1/2 TABLETS BY MOUTH ONCE DAILY 45 tablet 11   No current facility-administered medications on file prior to visit.    No Known Allergies  Past Medical History:  Diagnosis Date  . Depression   . Hypertension   . Osteoarthritis   . Urge incontinence   . Venous insufficiency     Past Surgical History:  Procedure Laterality Date  .  APPENDECTOMY  1966  . DILATION AND CURETTAGE OF UTERUS  01/27/14   benign  . JOINT REPLACEMENT     right knee  . TOTAL KNEE ARTHROPLASTY Right 03/20/2018   Procedure: RIGHT TOTAL KNEE ARTHROPLASTY;  Surgeon: Eugenia Mcalpine, MD;  Location: [facility];  Service: Orthopedics;  Laterality: Right;  . TOTAL KNEE ARTHROPLASTY Left 09/11/2018   Procedure: LEFT TOTAL KNEE ARTHROPLASTY;  Surgeon: Eugenia Mcalpine, MD;  Location: [facility];  Service: Orthopedics;  Laterality: Left;  . TUBAL  LIGATION  1966    Family History  Problem Relation Age of Onset  . Heart disease Father        died age 92- MI  . Alcohol abuse Brother   . Heart disease Brother   . Heart disease Sister   . Breast cancer Neg Hx     Social History   Socioeconomic History  . Marital status: Divorced    Spouse name: Not on file  . Number of children: 3  . Years of education: Not on file  . Highest education level: Not on file  Occupational History  . 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 Smoking status: Never Smoker  . Smokeless tobacco: Never Used  Vaping Use  . Vaping Use: Never used  Substance and Sexual Activity  . Alcohol use: No  . Drug use: No  . Sexual activity: Not on file  Other Topics Concern  . Not on file  Social History Narrative   Has living will   Designated son Ronny Flurry and other children to make health care decisions for her   Would accept CPR/resuscitation   Hasn't considered tube feedings--would leave to son   Social Determinants of Health   Financial Resource Strain: Not on file  Food Insecurity: Not on file  Transportation Needs: Not on file  Physical Activity: Not on file  Stress: Not on file  Social Connections: Not on file  Intimate Partner Violence: Not on file   Review of Systems Does feel tired in the morning Wears seat belt Teeth okay---keeps up with dentist Has dark spot on lower lip--no change that she can tell but dentist  told her to have it checked. No other skin lesions No heartburn or dysphagia Bowels move fine. No blood     Objective:   Physical Exam Constitutional:      Appearance: She is obese.  HENT:     Mouth/Throat:     Comments: No lesions Eyes:     Conjunctiva/sclera: Conjunctivae normal.     Pupils: Pupils are equal, round, and reactive to light.  Cardiovascular:     Rate and Rhythm: Normal rate and regular rhythm.     Pulses: Normal pulses.     Heart sounds: No  murmur heard. No gallop.   Pulmonary:     Effort: Pulmonary effort is normal.     Breath sounds: Normal breath sounds. No wheezing or rales.  Abdominal:     Palpations: Abdomen is soft.     Tenderness: There is no abdominal tenderness.  Musculoskeletal:     Cervical back: Neck supple.     Comments: Thick calves but no pitting  Lymphadenopathy:     Cervical: No cervical adenopathy.  Skin:    Findings: No rash.     Comments: 2-3 mm hyperpigmented lesion on lower lip (stable for over a year per patient).discussed derm evaluation if any change  Neurological:     Mental Status:  She is alert and oriented to person, place, and time.     Comments: President--- "Biden, Trump, Obama" 100-93- "I can't go backwards" D-l-r-o-w Recall 3/3  Psychiatric:        Mood and Affect: Mood normal.        Behavior: Behavior normal.            Assessment & Plan:

## 2020-12-27 NOTE — Assessment & Plan Note (Signed)
Wants all children involved in decision making

## 2020-12-28 ENCOUNTER — Ambulatory Visit: Payer: Medicare Other | Admitting: Pulmonary Disease

## 2021-01-08 ENCOUNTER — Telehealth: Payer: Self-pay | Admitting: Pulmonary Disease

## 2021-01-08 ENCOUNTER — Other Ambulatory Visit: Payer: Self-pay | Admitting: Internal Medicine

## 2021-01-08 MED ORDER — PANTOPRAZOLE SODIUM 40 MG PO TBEC: 40.0000 mg | DELAYED_RELEASE_TABLET | Freq: Every day | ORAL | 3 refills | Status: DC

## 2021-01-08 NOTE — Telephone Encounter (Signed)
Rx for Protonix 40mg  has been sent to preferred pharmacy.  Nikki with [facility] is aware and voiced her understanding,  Nothing further needed.

## 2021-01-08 NOTE — Telephone Encounter (Signed)
Last filled 12-13-20 #120 Last OV 12-27-20 Next OV 01-02-22 [facility]

## 2021-01-09 ENCOUNTER — Telehealth: Payer: Self-pay | Admitting: Pulmonary Disease

## 2021-01-09 DIAGNOSIS — G4733 Obstructive sleep apnea (adult) (pediatric): Secondary | ICD-10-CM

## 2021-01-09 NOTE — Telephone Encounter (Signed)
Lm for patient.  

## 2021-01-09 NOTE — Telephone Encounter (Signed)
Patient is scheduled for cpap compliance visit on 01/10/2021. I have spoke to patient, who stated that she has not been setup on cpap yet.  Order was placed to [facility] 09/2020. She would like to reschedule her visit until after she has been set up on cpap.  appt scheduled for 02/14/2021 at 11:00.  Synetta Fail, can you contact [facility] for update? Thanks

## 2021-01-09 NOTE — Telephone Encounter (Signed)
I spoke with Lurena Joiner with [facility] and she stated that they CXL the CPap order on 11/01/20 because the patient would never call them back. Lurena Joiner also stated that they sent some type of notification to Dr. Jayme Cloud about this.  I have spoken with Mrs. Peto and she stated that she had not gotten any messages from anyone about her cpap.  I told her they were probably calling her to let her know what her part would be after the insurance paid their part.  Mrs. Huebert stated that if she had to pay anything she couldn't afford it.  She then proceeded to tell me that she had a long distance call coming in and she had to go.  She would call us back

## 2021-01-09 NOTE — Telephone Encounter (Signed)
Pt returning missed call. States she has to deal w/ a family emergency so if she misses our call she will call us back tomorrow. [phone]

## 2021-01-09 NOTE — Telephone Encounter (Signed)
Noted. Will await call back

## 2021-01-10 ENCOUNTER — Ambulatory Visit: Payer: Medicare Other | Admitting: Pulmonary Disease

## 2021-01-10 NOTE — Telephone Encounter (Signed)
Spoke to patient and relayed below message from [place]. Patient stated that he would like to know her out of pocket cost before proceeding with cpap.  Order has bene resent to [place], as original order was canceled.  Nothing further needed at this time.

## 2021-01-12 DIAGNOSIS — G4733 Obstructive sleep apnea (adult) (pediatric): Secondary | ICD-10-CM | POA: Diagnosis not present

## 2021-01-12 DIAGNOSIS — I5032 Chronic diastolic (congestive) heart failure: Secondary | ICD-10-CM | POA: Diagnosis not present

## 2021-02-02 NOTE — Progress Notes (Signed)
Rich,  She was advised order was placed that there is a LONG delay on getting CPAP due to multiple recalls on CPAP machines.  They are putting patients on "cue" to receive these machines.  The order has been placed but unfortunately it will take quite a bit time to get these machines due to the recalls and the supply chain issues.  Vernona Rieger

## 2021-02-02 NOTE — Progress Notes (Signed)
Staff did let her know.

## 2021-02-07 ENCOUNTER — Other Ambulatory Visit: Payer: Self-pay | Admitting: Internal Medicine

## 2021-02-07 NOTE — Telephone Encounter (Signed)
Last filled 01-10-21 #120 Last OV 12-27-20 Next OV 01-02-22 [facility]

## 2021-02-08 ENCOUNTER — Other Ambulatory Visit: Payer: Self-pay | Admitting: Internal Medicine

## 2021-02-12 DIAGNOSIS — G4733 Obstructive sleep apnea (adult) (pediatric): Secondary | ICD-10-CM | POA: Diagnosis not present

## 2021-02-12 DIAGNOSIS — I5032 Chronic diastolic (congestive) heart failure: Secondary | ICD-10-CM | POA: Diagnosis not present

## 2021-02-13 ENCOUNTER — Telehealth: Payer: Self-pay | Admitting: Pulmonary Disease

## 2021-02-13 NOTE — Telephone Encounter (Addendum)
Patient is scheduled for OV on 02/14/2021. Order was placed to [facility] for cpap on 01/10/2021.  I called [facility] for update. Per Archie Patten, patient has not been setup on cpap. [facility] has [employer] forms to patient and has not received forms back. [facility] has attempted to contact patient twice without success for update.  This form is needed to process the order for the cpap machine.  Will discuss this with patient during her visit on 02/14/2021.

## 2021-02-14 ENCOUNTER — Other Ambulatory Visit: Payer: Self-pay

## 2021-02-14 ENCOUNTER — Ambulatory Visit (INDEPENDENT_AMBULATORY_CARE_PROVIDER_SITE_OTHER): Payer: Medicare Other | Admitting: Pulmonary Disease

## 2021-02-14 ENCOUNTER — Encounter: Payer: Self-pay | Admitting: Pulmonary Disease

## 2021-02-14 VITALS — BP 136/78 | HR 77 | Temp 98.0°F | Ht 63.0 in | Wt 264.0 lb

## 2021-02-14 DIAGNOSIS — Z6841 Body Mass Index (BMI) 40.0 and over, adult: Secondary | ICD-10-CM

## 2021-02-14 DIAGNOSIS — K219 Gastro-esophageal reflux disease without esophagitis: Secondary | ICD-10-CM

## 2021-02-14 DIAGNOSIS — G4733 Obstructive sleep apnea (adult) (pediatric): Secondary | ICD-10-CM | POA: Diagnosis not present

## 2021-02-14 DIAGNOSIS — E662 Morbid (severe) obesity with alveolar hypoventilation: Secondary | ICD-10-CM | POA: Diagnosis not present

## 2021-02-14 DIAGNOSIS — J40 Bronchitis, not specified as acute or chronic: Secondary | ICD-10-CM | POA: Diagnosis not present

## 2021-02-14 DIAGNOSIS — R0981 Nasal congestion: Secondary | ICD-10-CM | POA: Diagnosis not present

## 2021-02-14 DIAGNOSIS — K449 Diaphragmatic hernia without obstruction or gangrene: Secondary | ICD-10-CM | POA: Diagnosis not present

## 2021-02-14 DIAGNOSIS — D7218 Eosinophilia in diseases classified elsewhere: Secondary | ICD-10-CM

## 2021-02-14 MED ORDER — ARNUITY ELLIPTA 100 MCG/ACT IN AEPB: 1.0000 | INHALATION_SPRAY | Freq: Every day | RESPIRATORY_TRACT | 6 refills | Status: DC

## 2021-02-14 NOTE — Patient Instructions (Signed)
We are going to switch your CPAP company to [facility].  I am going to give you a trial of an inhaler called Arnuity.  Use 1 puff daily.  Rinse your mouth well after use.  You can continue using Atrovent (ipratropium) as needed.  We will see you in follow-up in 3 months time call sooner should any new problems arise.

## 2021-02-14 NOTE — Progress Notes (Signed)
Subjective:    Patient ID: Autumn Price, female    DOB: Jul 07, 1941, 80 y.o.   MRN: [id number]  HPI Patient is a 80 year old woman, lifelong never smoker who presents for follow-up on shortness of breath.  She had a sleep study in the latter part of October that show that she has moderate sleep apnea.  She has also nocturnal oxygen desaturations. CPAP equipment has been ordered however the DME company has failed to reach the patient despite many attempts, to provide her with the equipment.  Once they were unable to get her on the phone they mailed papers to her and she declined to sign them.  They have now closed the case and we will have to set her up with a different DME company.  She should be on an AutoSet 5 to 20 cm H2O and we should get download on CPAP 2 weeks after the unit is set up.  With regards to her dyspnea which was her original complaint she feels that this is actually improving.  PFTs have shown that her dyspnea is most related to obesity (BMI 47).  Today she does complain of some cough productive of yellowish sputum with some upper airway rhonchorous sounds.  Dated to seasonal change.  She has not had any fevers, chills or sweats.  She notes that ipratropium inhaler does help her some with the symptoms.  She does have gastroesophageal reflux for which she takes Protonix and notes that her reflux has been controlled on the Protonix.  She seems to have chronic issues with nasal congestion and postnasal drip.  Note that she is on Myrbetriq which can aggravate these issues.  DATA: 10/13/2019 2D echo: LVEF 60 to 65%, essentially normal, no pulmonary hypertension 06/05/2020 overnight oximetry: Shows desaturations to 72% throughout the course of the night with a high oxygen desaturation index/sleep disturbance (24 events per hour) 06/27/2020 PFTs: FEV1 1.86 L or 93% predicted, FVC 2.28 L or 86% predicted.  FEV1/FVC 82%. No bronchodilator response.  Diffusion capacity not performed despite  being ordered.  No obstructive defect.  No restrictive defect 10/09/2020 Home sleep study: Desaturations noted during study as low as 81%.  Average of 91%.  AHI was 15.5, consistent with moderate obstructive sleep apnea 10/17/2020 nuclear stress test: Low risk study, sensitivity reduced to the patient's body habitus, coronary artery calcifications noted on attenuation correction CT.  Review of Systems A 10 point review of systems was performed and it is as noted above otherwise negative.  Patient Active Problem List   Diagnosis Date Noted  . Persistent cough 05/11/2020  . Chronic diastolic heart failure (HCC) 10/25/2019  . DOE (dyspnea on exertion) 10/11/2019  . Primary osteoarthritis of right knee 03/20/2018  . S/P knee replacement 03/20/2018  . Osteoarthritis of left knee 08/30/2016  . Advance directive discussed with patient 08/04/2015  . Mood disorder (HCC) 06/10/2014  . Morbid obesity (HCC) 05/22/2012  . Routine general medical examination at a health care facility 03/19/2011  . Mixed stress and urge urinary incontinence 03/19/2011  . Essential hypertension, benign 08/21/2010  . Chronic venous insufficiency 08/21/2010  . Osteoarthritis, multiple sites 08/21/2010   No Known Allergies  Immunization History  Administered Date(s) Administered  . Fluad Quad(high Dose 65+) 10/11/2020  . Influenza Inj Mdck Quad Pf 10/30/2018, 09/25/2019  . Influenza, Seasonal, Injecte, Preservative Fre 09/30/2016  . Influenza,inj,Quad PF,6+ Mos 09/12/2017  . Pneumococcal Conjugate-13 06/10/2014  . Pneumococcal Polysaccharide-23 01/16/2009, 08/12/2017  . Td 08/21/2010  . Zoster 09/18/2016  .  Zoster Recombinat (Shingrix) 09/30/2019, 01/11/2020     Current Meds  Medication Sig  . acetaminophen (TYLENOL) 500 MG tablet Take 500 mg by mouth every 6 (six) hours as needed for mild pain or moderate pain.  Marland Kitchen amLODipine (NORVASC) 5 MG tablet TAKE ONE TABLET BY MOUTH ONCE DAILY  .  Fexofenadine-Pseudoephedrine (ALLEGRA-D 24 HOUR PO) Take 1 tablet by mouth daily as needed.  . Fluticasone Furoate (ARNUITY ELLIPTA) 100 MCG/ACT AEPB Inhale 1 puff into the lungs daily.  . furosemide (LASIX) 20 MG tablet TAKE ONE TABLET BY MOUTH ONCE DAILY  . ipratropium (ATROVENT HFA) 17 MCG/ACT inhaler Inhale 2 puffs into the lungs 3 (three) times daily as needed for wheezing.  Marland Kitchen ketoconazole (NIZORAL) 2 % cream APPLY 1 APPLICATION TOPICALLY TO GROIN AREA 2 TIMES A DAY AS NEEDED FOR IRRITATION  . mirabegron ER (MYRBETRIQ) 50 MG TB24 tablet Take 1 tablet (50 mg total) by mouth daily.  . pantoprazole (PROTONIX) 40 MG tablet Take 1 tablet (40 mg total) by mouth daily.  . traMADol (ULTRAM) 50 MG tablet TAKE TWO TABLETS BY MOUTH TWICE DAILY AS NEEDED       Objective:   Physical Exam BP 136/78 (BP Location: Left Arm, Cuff Size: Normal)   Pulse 77   Temp 98 F (36.7 C) (Temporal)   Ht 5\' 3"  (1.6 m)   Wt 264 lb (119.7 kg)   SpO2 97%   BMI 46.77 kg/m  GENERAL: Obese woman, no acute distress, fully ambulatory conversational dyspnea HEAD: Normocephalic, atraumatic.  EYES: Pupils equal, round, reactive to light. No scleral icterus.  MOUTH: Nose/mouth/throat not examined due to masking requirements for COVID 19. NECK: Supple. No thyromegaly. Trachea midline. No JVD. No adenopathy. PULMONARY: Good air entry bilaterally. No adventitious sounds, specifically no rhonchi no wheezes. CARDIOVASCULAR: S1 and S2. Regular rate and rhythm. No rubs, murmurs or gallops heard. ABDOMEN: Obese otherwise benign. MUSCULOSKELETAL: No joint deformity, no clubbing, no edema.  NEUROLOGIC: No focal deficit,no gait disturbance, speech is fluent. SKIN: Intact,warm,dry. Limited exam shows no rashes PSYCH: Mood appropriate, behavior normal.     Assessment & Plan:     ICD-10-CM   1. Eosinophilic bronchitis with cough  J40    D72.18    Seasonal change onset of cough Query eosinophilic bronchitis Trial of  Arnuity Ellipta  2. OSA (obstructive sleep apnea)  G47.33 AMB REFERRAL FOR DME   Will need auto CPAP 5 to 20 cm H2O ONOX on CPAP without O2 bled in Continue O2 until CPAP available Change DME companies at patient's request  3. Hiatal hernia with GERD  K21.9    K44.9    Continue antireflux measures Continue Protonix This issue aggravates her cough  4. Class 3 obesity with alveolar hypoventilation and body mass index (BMI) of 45.0 to 49.9 in adult, unspecified whether serious comorbidity present (HCC)  E66.2    Z68.42    Weight loss recommended This issue adds complexity to her management  5. Chronic nasal congestion  R09.81    Leading to upper airway cough syndrome Note Myrbetriq can aggravate this issue   Meds ordered this encounter  Medications  . Fluticasone Furoate (ARNUITY ELLIPTA) 100 MCG/ACT AEPB    Sig: Inhale 1 puff into the lungs daily.    Dispense:  30 each    Refill:  6   Orders Placed This Encounter  Procedures  . AMB REFERRAL FOR DME    Referral Priority:   Routine    Referral Type:   Durable Medical  Equipment Purchase    Number of Visits Requested:   1   Discussion: The patient has had CPAP requested however the initial DME company was unable to speak with her as she would not answer phone calls.  Paperwork was sent to the patient which the patient did not sign because she could not understand it.  She never reached out to the company to try to determine what was needed.  We will switch DME companies at her request.  I have advised her that she MUST answer the phone when the DME company [employer]) calls her so that they can set her up with her CPAP.  She understands also that there may be a delay due to national backorder on CPAP units.  With regards to her cough, she is to continue antireflux measures and Protonix as GERD is one of the triggers.  She appears to have issues now with eosinophilic bronchitis and will give her a trial of Arnuity Ellipta.  She has  constant issues with nasal congestion which can be seasonal versus perennial rhinitis note that she is on Myrbetriq which can aggravate these nasal symptoms.  We will see her in follow-up in 3 months time she is to contact us prior to that time should any new difficulties arise.  Gailen Shelter, MD [facility]   *This note was dictated using voice recognition software/Dragon.  Despite best efforts to proofread, errors can occur which can change the meaning.  Any change was purely unintentional.

## 2021-03-01 ENCOUNTER — Telehealth: Payer: Self-pay | Admitting: Pulmonary Disease

## 2021-03-01 NOTE — Telephone Encounter (Signed)
Spoke to patient, who stated that she has not been contacted by [facility] regarding cpap.   Jarrett Soho, can you help with this?

## 2021-03-01 NOTE — Telephone Encounter (Signed)
I called and spoke with Autumn Price with [facility].  She stated their notes state that Autumn Price called the patient and left a message on 02/15/21 that they had received the Cpap order and it would be 3-4 months out.  I have spoken with Autumn Price and gave her this information along with [facility]'s phone # [phone]

## 2021-03-12 ENCOUNTER — Other Ambulatory Visit: Payer: Self-pay | Admitting: Internal Medicine

## 2021-03-12 DIAGNOSIS — G4733 Obstructive sleep apnea (adult) (pediatric): Secondary | ICD-10-CM | POA: Diagnosis not present

## 2021-03-12 DIAGNOSIS — I5032 Chronic diastolic (congestive) heart failure: Secondary | ICD-10-CM | POA: Diagnosis not present

## 2021-03-12 NOTE — Telephone Encounter (Signed)
Last filled 02-07-21 #120 Last OV 12-27-20 Next OV 01-02-22 [facility]

## 2021-03-14 ENCOUNTER — Telehealth: Payer: Self-pay

## 2021-03-14 NOTE — Telephone Encounter (Signed)
Autumn Price is calling in from [employer] about the patients prescription for incontinence supplies, DMA request form question 19 and 20 were not filled out, and in order to send supplies it needs to be completed and signed.Requesting a call back.

## 2021-03-14 NOTE — Telephone Encounter (Signed)
Everything that I have received has been sent to be scanned.

## 2021-03-14 NOTE — Telephone Encounter (Signed)
It has not been scanned in yet. Not sure when it was faxed. Will send to Lyla Son and Irving Burton to see if it has gone to be scanned or if it still here.

## 2021-04-10 ENCOUNTER — Telehealth: Payer: Self-pay | Admitting: Pulmonary Disease

## 2021-04-10 NOTE — Telephone Encounter (Signed)
The original order for Cpap was sent to [facility].  When the ONO order was placed to be done on CPAP it was sent to [facility] as well.  I called and spoke with Verlon Au with [facility] and in the mean time realized that we changed the Cpap order to be sent to [facility].  Verlon Au stated that they mailed a letter to Autumn Price on 02/26/21 letting her know that the Cpap order and the ONO order was going to be CXL in 10 to 14 days. [facility] never heard anything from the patient and Verlon Au CXL the ONO order yesterday 04/09/21

## 2021-04-10 NOTE — Telephone Encounter (Addendum)
Received letter from [facility] stating that ONO was canceled, as they were unable to contact patient.  Patient stated that she did not receive a call and he thinks she had ONO months ago.   Synetta Fail, can you help with this?

## 2021-04-10 NOTE — Telephone Encounter (Signed)
Noted by triage.   Lm to make patient aware.

## 2021-04-11 NOTE — Telephone Encounter (Signed)
Patient is aware of below message and voiced her understanding.  Nothing further needed at this time.   

## 2021-04-12 DIAGNOSIS — G4733 Obstructive sleep apnea (adult) (pediatric): Secondary | ICD-10-CM | POA: Diagnosis not present

## 2021-04-12 DIAGNOSIS — I5032 Chronic diastolic (congestive) heart failure: Secondary | ICD-10-CM | POA: Diagnosis not present

## 2021-04-14 ENCOUNTER — Other Ambulatory Visit: Payer: Self-pay | Admitting: Internal Medicine

## 2021-04-14 ENCOUNTER — Other Ambulatory Visit: Payer: Self-pay | Admitting: Pulmonary Disease

## 2021-04-16 NOTE — Telephone Encounter (Signed)
Called in refill on pharmacy voice mail  

## 2021-04-16 NOTE — Telephone Encounter (Signed)
Last filled 03-13-21 #120 Last OV 12-27-20 Next OV 01-02-22 [facility]

## 2021-04-16 NOTE — Telephone Encounter (Signed)
Okay #120 x 0 

## 2021-05-03 ENCOUNTER — Other Ambulatory Visit: Payer: Self-pay | Admitting: Internal Medicine

## 2021-05-12 DIAGNOSIS — G4733 Obstructive sleep apnea (adult) (pediatric): Secondary | ICD-10-CM | POA: Diagnosis not present

## 2021-05-12 DIAGNOSIS — I5032 Chronic diastolic (congestive) heart failure: Secondary | ICD-10-CM | POA: Diagnosis not present

## 2021-05-17 ENCOUNTER — Other Ambulatory Visit: Payer: Self-pay | Admitting: Internal Medicine

## 2021-05-17 NOTE — Telephone Encounter (Signed)
Last filled 04-16-21 #120 Last OV 12-27-20 Next OV 01-02-22 [facility]

## 2021-06-12 DIAGNOSIS — G4733 Obstructive sleep apnea (adult) (pediatric): Secondary | ICD-10-CM | POA: Diagnosis not present

## 2021-06-12 DIAGNOSIS — I5032 Chronic diastolic (congestive) heart failure: Secondary | ICD-10-CM | POA: Diagnosis not present

## 2021-06-14 ENCOUNTER — Other Ambulatory Visit: Payer: Self-pay | Admitting: Internal Medicine

## 2021-06-15 ENCOUNTER — Other Ambulatory Visit: Payer: Self-pay | Admitting: Pulmonary Disease

## 2021-06-19 NOTE — Telephone Encounter (Signed)
Last filled 05-17-21 #120 Last OV 12-27-20 Next OV 01-02-22 [facility]

## 2021-06-22 ENCOUNTER — Telehealth: Payer: Self-pay | Admitting: Internal Medicine

## 2021-06-22 MED ORDER — MOLNUPIRAVIR 200 MG PO CAPS: 4.0000 | ORAL_CAPSULE | Freq: Two times a day (BID) | ORAL | 0 refills | Status: DC

## 2021-06-22 MED ORDER — ALBUTEROL SULFATE HFA 108 (90 BASE) MCG/ACT IN AERS
1.0000 | INHALATION_SPRAY | Freq: Four times a day (QID) | RESPIRATORY_TRACT | 1 refills | Status: DC | PRN
Start: 2021-06-22 — End: 2021-08-09

## 2021-06-22 NOTE — Telephone Encounter (Signed)
Discussed the situation She has cough and some chills/sweats  No SOB Son given paxlovid  Given her eosinophilic bronchitis (stopped the arnuity ellipta--didn't help) but does find the albuterol helpful--will Rx with molnupiravir (several drug interactions) and refill the albuterol. If she worsens, she will get seen at urgent care or ER

## 2021-06-22 NOTE — Telephone Encounter (Signed)
Mrs. Autumn Price called in due to she took her son to the hospital and he was tested positive for covid, and she went home and took a test and it was positive and she is having real bad headache and sweating bad. And wanted to know if she should start taking the medication.

## 2021-06-28 ENCOUNTER — Telehealth: Payer: Self-pay | Admitting: *Deleted

## 2021-06-28 NOTE — Telephone Encounter (Signed)
Spoke to pt. She will let us know [place] know.

## 2021-06-28 NOTE — Telephone Encounter (Signed)
Her symptoms are more than typical--but could still be consistent with slowly resolving COVID. If she gets worse, or isn't better by next week, she should seek in person visit for evaluation (we might be able to see her here by then).

## 2021-06-28 NOTE — Telephone Encounter (Signed)
Pt called triage line because she was recently diagnosed with Covid and she is still having some sxs, and she wanted to make sure PCP was aware. Pt said she did finish the Molnupiravir PCP prescribed but she still feels bad. Pt is having sever fatigue she said she can't go from her kitchen to her livingroom with out stopping and taking a break because she is so wiped out. She still has a bad cough that is a little productive and she is coughing up light yellow phlegm. Pt has no trouble breathing no SOB and no other concerning sxs except the 2 she advise me of (cough, fatigue) pt wanted to know what should she do, is this normal or should she be concerned  [facility]

## 2021-07-06 ENCOUNTER — Other Ambulatory Visit: Payer: Self-pay

## 2021-07-06 ENCOUNTER — Ambulatory Visit (INDEPENDENT_AMBULATORY_CARE_PROVIDER_SITE_OTHER): Payer: Medicare Other

## 2021-07-06 ENCOUNTER — Ambulatory Visit
Admission: EM | Admit: 2021-07-06 | Discharge: 2021-07-06 | Disposition: A | Discharge status: Home / Self Care | Payer: Medicare Other | Attending: Physician Assistant | Admitting: Physician Assistant

## 2021-07-06 DIAGNOSIS — J209 Acute bronchitis, unspecified: Secondary | ICD-10-CM

## 2021-07-06 DIAGNOSIS — R059 Cough, unspecified: Secondary | ICD-10-CM | POA: Diagnosis not present

## 2021-07-06 DIAGNOSIS — U099 Post covid-19 condition, unspecified: Secondary | ICD-10-CM | POA: Diagnosis not present

## 2021-07-06 DIAGNOSIS — U071 COVID-19: Secondary | ICD-10-CM

## 2021-07-06 MED ORDER — CHERATUSSIN AC 100-10 MG/5ML PO SOLN: 5.0000 mL | Freq: Four times a day (QID) | ORAL | 0 refills | Status: DC | PRN

## 2021-07-06 MED ORDER — PREDNISONE 20 MG PO TABS: 40.0000 mg | ORAL_TABLET | Freq: Every day | ORAL | 0 refills | Status: AC

## 2021-07-06 NOTE — ED Triage Notes (Signed)
Pt presents with ongoing cough post-COVID (+ 07/08).  Feels croupy and tight.  Denies SOB.  Completed Mulnupiravir.     Pulse ox has been running 93 at home, was also 93 in triage.

## 2021-07-06 NOTE — Telephone Encounter (Signed)
Pt called back and continues to have same cough with wheezing if long coughing episode. Pt is not having CP or SOB. Pt said the cough is no better at all. No available appts at [facility] and pt will go to [facility] in [place].sending FYI to Dr Alphonsus Sias in his in basket and [place] CMA.

## 2021-07-06 NOTE — Discharge Instructions (Addendum)
Your x-ray looks good.  It is not uncommon to continue to experience COVID symptoms for a while after diagnosis.  I have sent in some prednisone for you.  I have also sent in a cough syrup.  Increase rest and fluids.  Keep a check on your oxygen if it gets below 92% or you start to feel more short of breath, have chest pain especially on breathing or increase in weakness/fatigue you should call 911 or have someone take you to the emergency department.  You may continue to experience symptoms over the next couple of weeks.  Make a follow-up with your PCP if not improving and if you are unsatisfied with the pulmonologist that you have seen before, request referral to another pulmonologist.  Follow-up with [place] as needed.

## 2021-07-06 NOTE — ED Provider Notes (Signed)
[facility]    CSN: [id number] Arrival date & time: 07/06/21  1137      History   Chief Complaint Chief Complaint  Patient presents with   Cough    HPI Autumn Price is a 80 y.o. female presenting for 2 week history of cough, congestion, fatigue, and DOE.  Patient says her son tested positive for COVID-19 a couple weeks ago and she did not home test which was positive for COVID-19.  She notified her PCP and they prescribed her molnupiravir.  Patient took the full course of antiviral medicine.  She does admit that she has not been vaccinated for COVID-19.  Patient does have underlying dyspnea on exertion which her pulmonologist thought to be due to her obesity and eosinophilic bronchitis.  Patient uses albuterol as needed for shortness of breath and says that it does help.  She has not been taking any other OTC meds for symptoms.  Patient denies any fevers or chest pain.  Patient says she is not short of breath when she is at rest.  She says she does get fatigued and short of breath when she walks through her house.  Denies any increase in leg swelling.  She does also have history of chronic diastolic heart failure.  Additionally she has hypertension and chronic venous insufficiency.  Patient concerned because her symptoms have been ongoing for 2 weeks and have not improved despite taking the antiviral medication.  She does not feel any worse than she did at onset.  She has no other complaints or concerns.  HPI  Past Medical History:  Diagnosis Date   Depression    Hypertension    Osteoarthritis    Urge incontinence    Venous insufficiency     Patient Active Problem List   Diagnosis Date Noted   Persistent cough 05/11/2020   Chronic diastolic heart failure (HCC) 10/25/2019   DOE (dyspnea on exertion) 10/11/2019   Primary osteoarthritis of right knee 03/20/2018   S/P knee replacement 03/20/2018   Osteoarthritis of left knee 08/30/2016   Advance directive discussed  with patient 08/04/2015   Mood disorder (HCC) 06/10/2014   Morbid obesity (HCC) 05/22/2012   Routine general medical examination at a health care facility 03/19/2011   Mixed stress and urge urinary incontinence 03/19/2011   Essential hypertension, benign 08/21/2010   Chronic venous insufficiency 08/21/2010   Osteoarthritis, multiple sites 08/21/2010    Past Surgical History:  Procedure Laterality Date   APPENDECTOMY  1966   DILATION AND CURETTAGE OF UTERUS  01/27/14   benign   JOINT REPLACEMENT     right knee   TOTAL KNEE ARTHROPLASTY Right 03/20/2018   Procedure: RIGHT TOTAL KNEE ARTHROPLASTY;  Surgeon: Eugenia Mcalpineollins, Robert, MD;  Location: [facility];  Service: Orthopedics;  Laterality: Right;   TOTAL KNEE ARTHROPLASTY Left 09/11/2018   Procedure: LEFT TOTAL KNEE ARTHROPLASTY;  Surgeon: Eugenia Mcalpineollins, Robert, MD;  Location: [facility];  Service: Orthopedics;  Laterality: Left;   TUBAL LIGATION  1966    OB History   No obstetric history on file.      Home Medications    Prior to Admission medications   Medication Sig Start Date End Date Taking? Authorizing Provider  albuterol (VENTOLIN HFA) 108 (90 Base) MCG/ACT inhaler Inhale 1-2 puffs into the lungs every 6 (six) hours as needed for wheezing or shortness of breath. 06/22/21 07/22/21 Yes Tillman AbideLetvak, Richard I, MD  amLODipine (NORVASC) 5 MG tablet TAKE ONE TABLET BY MOUTH ONCE DAILY 07/17/20  Yes  Karie Schwalbe, MD  furosemide (LASIX) 20 MG tablet TAKE ONE TABLET BY MOUTH ONCE DAILY 02/08/21  Yes Karie Schwalbe, MD  guaiFENesin-codeine (CHERATUSSIN AC) 100-10 MG/5ML syrup Take 5 mLs by mouth 4 (four) times daily as needed for cough. 07/06/21  Yes Eusebio Friendly B, PA-C  ketoconazole (NIZORAL) 2 % cream APPLY 1 APPLICATION TOPICALLY TO GROIN AREA 2 TIMES A DAY AS NEEDED FOR IRRITATION 10/13/20  Yes Karie Schwalbe, MD  MYRBETRIQ 50 MG TB24 tablet TAKE ONE TABLET BY MOUTH ONCE DAILY 06/19/21  Yes Karie Schwalbe, MD  predniSONE (DELTASONE) 20 MG tablet  Take 2 tablets (40 mg total) by mouth daily for 5 days. 07/06/21 07/11/21 Yes Eusebio Friendly B, PA-C  traMADol (ULTRAM) 50 MG tablet TAKE TWO TABLETS BY MOUTH TWICE DAILY AS NEEDED 06/19/21  Yes Karie Schwalbe, MD  acetaminophen (TYLENOL) 500 MG tablet Take 500 mg by mouth every 6 (six) hours as needed for mild pain or moderate pain.    [provider]  Fexofenadine-Pseudoephedrine (ALLEGRA-D 24 HOUR PO) Take 1 tablet by mouth daily as needed.    [provider]  ipratropium (ATROVENT HFA) 17 MCG/ACT inhaler Inhale 2 puffs into the lungs 3 (three) times daily as needed for wheezing. 05/02/20   Verlee Monte, NP  Molnupiravir 200 MG CAPS Take 4 capsules (800 mg total) by mouth in the morning and at bedtime. 06/22/21   Karie Schwalbe, MD  pantoprazole (PROTONIX) 40 MG tablet Take 1 tablet (40 mg total) by mouth daily. 04/16/21 08/14/21  Salena Saner, MD  sertraline (ZOLOFT) 50 MG tablet TAKE 1 AND 1/2 TABLETS BY MOUTH ONCE DAILY 05/24/19 05/02/20  Karie Schwalbe, MD    Family History Family History  Problem Relation Age of Onset   Heart disease Father        died age 18- MI   Alcohol abuse Brother    Heart disease Brother    Heart disease Sister    Breast cancer Neg Hx     Social History Social History   Tobacco Use   Smoking status: Never   Smokeless tobacco: Never  Vaping Use   Vaping Use: Never used  Substance Use Topics   Alcohol use: No   Drug use: No     Allergies   Patient has no known allergies.   Review of Systems Review of Systems  Constitutional:  Positive for fatigue. Negative for chills, diaphoresis and fever.  HENT:  Positive for congestion and rhinorrhea. Negative for ear pain, sinus pressure, sinus pain and sore throat.   Respiratory:  Positive for cough and shortness of breath. Negative for chest tightness.   Cardiovascular:  Negative for chest pain and leg swelling.  Gastrointestinal:  Negative for abdominal pain, nausea and vomiting.   Musculoskeletal:  Negative for arthralgias and myalgias.  Skin:  Negative for rash.  Neurological:  Negative for weakness and headaches.  Hematological:  Negative for adenopathy.    Physical Exam Triage Vital Signs ED Triage Vitals  Enc Vitals Group     BP 07/06/21 1152 (!) 126/56     Pulse Rate 07/06/21 1152 71     Resp 07/06/21 1152 20     Temp 07/06/21 1152 98.5 F (36.9 C)     Temp Source 07/06/21 1152 Oral     SpO2 07/06/21 1152 93 %     Weight --      Height --      Head Circumference --  Peak Flow --      Pain Score 07/06/21 1154 0     Pain Loc --      Pain Edu? --      Excl. in GC? --    No data found.  Updated Vital Signs BP (!) 126/56 (BP Location: Left Arm)   Pulse 77   Temp 98.5 F (36.9 C) (Oral)   Resp (!) 24   SpO2 99%       Physical Exam Vitals and nursing note reviewed.  Constitutional:      General: She is not in acute distress.    Appearance: Normal appearance. She is obese. She is not ill-appearing or toxic-appearing.  HENT:     Head: Normocephalic and atraumatic.     Right Ear: Tympanic membrane, ear canal and external ear normal.     Left Ear: Tympanic membrane, ear canal and external ear normal.     Nose: Congestion present.     Mouth/Throat:     Mouth: Mucous membranes are moist.     Pharynx: Oropharynx is clear.  Eyes:     General: No scleral icterus.       Right eye: No discharge.        Left eye: No discharge.     Conjunctiva/sclera: Conjunctivae normal.  Cardiovascular:     Rate and Rhythm: Normal rate and regular rhythm.     Heart sounds: Normal heart sounds.  Pulmonary:     Effort: Pulmonary effort is normal. No respiratory distress.     Breath sounds: Normal breath sounds. No wheezing, rhonchi or rales.  Musculoskeletal:     Cervical back: Neck supple.     Right lower leg: No edema.     Left lower leg: No edema.  Skin:    General: Skin is dry.  Neurological:     General: No focal deficit present.     Mental  Status: She is alert. Mental status is at baseline.     Motor: No weakness.     Gait: Gait normal.  Psychiatric:        Mood and Affect: Mood normal.        Behavior: Behavior normal.        Thought Content: Thought content normal.     UC Treatments / Results  Labs (all labs ordered are listed, but only abnormal results are displayed) Labs Reviewed - No data to display  EKG   Radiology DG Chest 2 View  Result Date: 07/06/2021 CLINICAL DATA:  cough, post covid EXAM: CHEST - 2 VIEW COMPARISON:  05/02/2020, chest CT 10/08/2019 FINDINGS: The heart size and mediastinal contours are within normal limits. No focal airspace disease. The visualized skeletal structures are unremarkable. Thoracic spondylosis. IMPRESSION: No evidence of acute cardiopulmonary disease. Electronically Signed   By: Caprice Renshaw   On: 07/06/2021 12:50    Procedures Procedures (including critical care time)  Medications Ordered in UC Medications - No data to display  Initial Impression / Assessment and Plan / UC Course  I have reviewed the triage vital signs and the nursing notes.  Pertinent labs & imaging results that were available during my care of the patient were reviewed by me and considered in my medical decision making (see chart for details).  80 year old female presenting for continued COVID symptoms.  She was diagnosed about 2 weeks ago with COVID-19 and did complete treatment without molnupiravir.  She has not been vaccinated for COVID-19.  She also has underlying respiratory issues.  Creased  dyspnea on exertion from her baseline.  Patient says her oxygen saturation at home was 93% to 94%.  In clinic today ranges from 93% to 95%.  She appears not to be in any acute respiratory distress at rest.  She does walk several 100 feet to the imaging office and oxygen was checked when she got back to the room and it was 99%.  She did have increased respiratory rate of 24 at this time.  Patient's exam is significant  for well-appearing female and her lungs were clear to auscultation.  X-ray obtained today to assess for underlying pneumonia.  X-ray independently viewed by me.  X-ray is within normal limits.  Discussed this with patient.  Suspect she has continued COVID symptoms and possibly bronchitis.  Treating her at this time with prednisone especially given her history of underlying respiratory issues causing her to see a pulmonologist.  Advised albuterol as needed.  She says that she has plenty and 3 refills if needed.  I have sent in Cheratussin.  She says she has had this medication before and did want to take a medication with codeine.  Advised her to be careful with this like as it can make her drowsy.  Increase rest and fluids.  Advised to keep a close eye on her oxygen saturation and if it is below 90% or she develops chest pain or increased weakness to call 911 or have a friend/member take her to the ED.  Patient is agreeable.   Final Clinical Impressions(s) / UC Diagnoses   Final diagnoses:  COVID-19  Acute bronchitis, unspecified organism  Cough     Discharge Instructions      Your x-ray looks good.  It is not uncommon to continue to experience COVID symptoms for a while after diagnosis.  I have sent in some prednisone for you.  I have also sent in a cough syrup.  Increase rest and fluids.  Keep a check on your oxygen if it gets below 92% or you start to feel more short of breath, have chest pain especially on breathing or increase in weakness/fatigue you should call 911 or have someone take you to the emergency department.  You may continue to experience symptoms over the next couple of weeks.  Make a follow-up with your PCP if not improving and if you are unsatisfied with the pulmonologist that you have seen before, request referral to another pulmonologist.  Follow-up with [place] as needed.     ED Prescriptions     Medication Sig Dispense Auth. Provider   predniSONE (DELTASONE) 20 MG tablet  Take 2 tablets (40 mg total) by mouth daily for 5 days. 10 tablet Eusebio Friendly B, PA-C   guaiFENesin-codeine (CHERATUSSIN AC) 100-10 MG/5ML syrup Take 5 mLs by mouth 4 (four) times daily as needed for cough. 120 mL Shirlee Latch, PA-C      PDMP not reviewed this encounter.   Shirlee Latch, PA-C 07/06/21 1354

## 2021-07-10 NOTE — Telephone Encounter (Signed)
Spoke to pt. She said she is still fatigued but does feel some better.

## 2021-07-12 DIAGNOSIS — I5032 Chronic diastolic (congestive) heart failure: Secondary | ICD-10-CM | POA: Diagnosis not present

## 2021-07-12 DIAGNOSIS — G4733 Obstructive sleep apnea (adult) (pediatric): Secondary | ICD-10-CM | POA: Diagnosis not present

## 2021-07-17 ENCOUNTER — Other Ambulatory Visit: Payer: Self-pay | Admitting: Internal Medicine

## 2021-07-18 ENCOUNTER — Telehealth: Payer: Self-pay

## 2021-07-18 MED ORDER — TRAMADOL HCL 50 MG PO TABS: 50.0000 mg | ORAL_TABLET | Freq: Four times a day (QID) | ORAL | 0 refills | Status: DC | PRN

## 2021-07-18 NOTE — Addendum Note (Signed)
Addended by: Tillman Abide I on: 07/18/2021 08:21 AM   Modules accepted: Orders

## 2021-07-18 NOTE — Telephone Encounter (Signed)
Last filled 06-19-21 #120 Last OV 12-27-20 Next OV 01-02-22 [facility]

## 2021-08-09 ENCOUNTER — Other Ambulatory Visit: Payer: Self-pay | Admitting: Internal Medicine

## 2021-08-12 DIAGNOSIS — G4733 Obstructive sleep apnea (adult) (pediatric): Secondary | ICD-10-CM | POA: Diagnosis not present

## 2021-08-12 DIAGNOSIS — I5032 Chronic diastolic (congestive) heart failure: Secondary | ICD-10-CM | POA: Diagnosis not present

## 2021-08-15 ENCOUNTER — Other Ambulatory Visit: Payer: Self-pay | Admitting: Pulmonary Disease

## 2021-08-18 ENCOUNTER — Ambulatory Visit (INDEPENDENT_AMBULATORY_CARE_PROVIDER_SITE_OTHER): Payer: Medicare Other

## 2021-08-18 ENCOUNTER — Other Ambulatory Visit: Payer: Self-pay | Admitting: Internal Medicine

## 2021-08-18 ENCOUNTER — Other Ambulatory Visit: Payer: Self-pay

## 2021-08-18 DIAGNOSIS — Z0000 Encounter for general adult medical examination without abnormal findings: Secondary | ICD-10-CM

## 2021-08-18 NOTE — Progress Notes (Signed)
Subjective:   Autumn Price is a 80 y.o. female who presents for Medicare Annual (Subsequent) preventive examination.  I connected with  Autumn Price on 08/18/21 by a telemedicine enabled telemedicine application and verified that I am speaking with the correct person using two identifiers.   I discussed the limitations of evaluation and management by telemedicine. The patient expressed understanding and agreed to proceed.   Location of Patient: Home Location of Provider: Office Person Participating in Visit: Shellyann Wandrey. [facility] Burgess Estelle CMA  Review of Systems    Defer to PCP       Objective:    There were no vitals filed for this visit. There is no height or weight on file to calculate BMI.  Advanced Directives 08/18/2021 11/15/2020 04/07/2020 10/08/2019 10/08/2019 09/11/2018 09/04/2018  Does Patient Have a Medical Advance Directive? Yes Yes Yes Yes Yes No No  Type of Estate agent of [place];Living will Living will;Healthcare Power of [employer] Power of [place];Living will Healthcare Power of [place];Living will Healthcare Power of [place];Living will - -  Does patient want to make changes to medical advance directive? No - Patient declined - - - - - -  Copy of Healthcare Power of Attorney in Chart? No - copy requested - - - - - -  Would patient like information on creating a medical advance directive? - - - - - Yes (MAU/Ambulatory/Procedural Areas - Information given) Yes (MAU/Ambulatory/Procedural Areas - Information given)    Current Medications (verified) Outpatient Encounter Medications as of 08/18/2021  Medication Sig   acetaminophen (TYLENOL) 500 MG tablet Take 500 mg by mouth every 6 (six) hours as needed for mild pain or moderate pain.   albuterol (VENTOLIN HFA) 108 (90 Base) MCG/ACT inhaler INHALE 1-2 PUFFS BY MOUTH EVERY 6 HOURS AS NEEDED FOR WHEEZE OR SHORTNESS OF BREATH   amLODipine (NORVASC) 5 MG tablet TAKE ONE TABLET BY MOUTH  ONCE DAILY   Fexofenadine-Pseudoephedrine (ALLEGRA-D 24 HOUR PO) Take 1 tablet by mouth daily as needed.   furosemide (LASIX) 20 MG tablet TAKE ONE TABLET BY MOUTH ONCE DAILY   ketoconazole (NIZORAL) 2 % cream APPLY 1 APPLICATION TOPICALLY TO GROIN AREA 2 TIMES A DAY AS NEEDED FOR IRRITATION   MYRBETRIQ 50 MG TB24 tablet TAKE ONE TABLET BY MOUTH ONCE DAILY   [DISCONTINUED] guaiFENesin-codeine (CHERATUSSIN AC) 100-10 MG/5ML syrup Take 5 mLs by mouth 4 (four) times daily as needed for cough.   [DISCONTINUED] ipratropium (ATROVENT HFA) 17 MCG/ACT inhaler Inhale 2 puffs into the lungs 3 (three) times daily as needed for wheezing.   [DISCONTINUED] Molnupiravir 200 MG CAPS Take 4 capsules (800 mg total) by mouth in the morning and at bedtime.   [DISCONTINUED] pantoprazole (PROTONIX) 40 MG tablet TAKE ONE TABLET BY MOUTH ONCE DAILY   [DISCONTINUED] sertraline (ZOLOFT) 50 MG tablet TAKE 1 AND 1/2 TABLETS BY MOUTH ONCE DAILY   [DISCONTINUED] traMADol (ULTRAM) 50 MG tablet Take 1 tablet (50 mg total) by mouth every 6 (six) hours as needed.   No facility-administered encounter medications on file as of 08/18/2021.    Allergies (verified) Patient has no known allergies.   History: Past Medical History:  Diagnosis Date   Depression    Hypertension    Osteoarthritis    Urge incontinence    Venous insufficiency    Past Surgical History:  Procedure Laterality Date   APPENDECTOMY  1966   DILATION AND CURETTAGE OF UTERUS  01/27/14   benign   JOINT  REPLACEMENT     right knee   TOTAL KNEE ARTHROPLASTY Right 03/20/2018   Procedure: RIGHT TOTAL KNEE ARTHROPLASTY;  Surgeon: Eugenia Mcalpine, MD;  Location: [facility];  Service: Orthopedics;  Laterality: Right;   TOTAL KNEE ARTHROPLASTY Left 09/11/2018   Procedure: LEFT TOTAL KNEE ARTHROPLASTY;  Surgeon: Eugenia Mcalpine, MD;  Location: [facility];  Service: Orthopedics;  Laterality: Left;   TUBAL LIGATION  1966   Family History  Problem Relation Age of Onset    Heart disease Father        died age 37- MI   Alcohol abuse Brother    Heart disease Brother    Heart disease Sister    Breast cancer Neg Hx    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    Comment: [employer] finishing  Tobacco Use   Smoking status: Never   Smokeless tobacco: Never  Vaping Use   Vaping Use: Never used  Substance and Sexual Activity   Alcohol use: No   Drug use: No   Sexual activity: Not Currently  Other Topics Concern   Not on file  Social History Narrative   Has living will   Designated son Ronny Flurry and other children to make health care decisions for her   Would accept CPR/resuscitation   Hasn't considered tube feedings--would leave to son   Social Determinants of Health   Financial Resource Strain: Low Risk    Difficulty of Paying Living Expenses: Not hard at all  Food Insecurity: No Food Insecurity   Worried About Programme researcher, broadcasting/film/video in the Last Year: Never true   Ran Out of Food in the Last Year: Never true  Transportation Needs: No Transportation Needs   Lack of Transportation (Medical): No   Lack of Transportation (Non-Medical): No  Physical Activity: Insufficiently Active   Days of Exercise per Week: 4 days   Minutes of Exercise per Session: 10 min  Stress: No Stress Concern Present   Feeling of Stress : Only a little  Social Connections: Moderately Isolated   Frequency of Communication with Friends and Family: More than three times a week   Frequency of Social Gatherings with Friends and Family: Never   Attends Religious Services: 1 to 4 times per year   Active Member of [employer] or Organizations: No   Attends Engineer, structural: Never   Marital Status: Divorced    Tobacco Counseling Counseling given: Not  Answered   Clinical Intake:  Pre-visit preparation completed: Yes  Pain : No/denies pain     Diabetes: No  How often do you need to have someone help you when you read instructions, pamphlets, or other written materials from your doctor or pharmacy?: 1 - Never What is the last grade level you completed in school?: 10th grade  Diabetic? No  Interpreter Needed?: No      Activities of Daily Living In your present state of health, do you have any difficulty performing the following activities: 08/18/2021  Hearing? N  Vision? Y  Comment Wears glasses  Difficulty concentrating or making decisions? N  Walking or climbing stairs? Y  Comment some issues with walking upstairs due to both knees being replaced  Dressing or bathing? N  Doing errands, shopping? N  Some recent data might be hidden    Patient Care Team: Karie SchwalbeLetvak, Richard I, MD as PCP - General Debbe OdeaAgbor-Etang, Brian, MD as PCP - Cardiology (Cardiology)  Indicate any recent Medical Services you may have received from other than [facility] providers in the past year (date may be approximate).     Assessment:   This is a routine wellness examination for Burna MortimerWanda.  Hearing/Vision screen No results found.  Dietary issues and exercise activities discussed: Current Exercise Habits: The patient does not participate in regular exercise at present, Exercise limited by: respiratory conditions(s) (Patient just recently got over Covid still having breathing issues)   Goals Addressed   None   Depression Screen PHQ 2/9 Scores 08/18/2021 12/27/2020 09/10/2018 08/12/2017 08/09/2016 08/04/2015 06/10/2014  PHQ - 2 Score 0 0 - - 0 1 3  PHQ- 9 Score - - - - - - 9  Exception Documentation - - Medical reason Medical reason - - -    Fall Risk Fall Risk  08/18/2021 12/27/2020 09/10/2018 08/12/2017 08/09/2016  Falls in the past year? 0 0 No Yes Yes  Number falls in past yr: 0 - - 1 1  Injury with Fall? 0 - - No No  Risk Factor Category  - - - - -  Risk  for fall due to : No Fall Risks - - - -  Follow up Falls evaluation completed - - - -    FALL RISK PREVENTION PERTAINING TO THE HOME:  Any stairs in or around the home? Yes  If so, are there any without handrails? Yes  Home free of loose throw rugs in walkways, pet beds, electrical cords, etc? No  Adequate lighting in your home to reduce risk of falls? No   ASSISTIVE DEVICES UTILIZED TO PREVENT FALLS:  Life alert? No  Use of a cane, walker or w/c? No  Grab bars in the bathroom? No  Shower chair or bench in shower? No  Elevated toilet seat or a handicapped toilet? Yes   TIMED UP AND GO:  Was the test performed? No .  Length of time to ambulate 10 feet: n/a sec.     Cognitive Function:     6CIT Screen 08/18/2021  What Year? 0 points  What month? 0 points  What time? 0 points  Count back from 20 0 points  Months in reverse 0 points  Repeat phrase 0 points  Total Score 0    Immunizations Immunization History  Administered Date(s) Administered   Fluad Quad(high Dose 65+) 10/11/2020   Influenza Inj Mdck Quad Pf 10/30/2018, 09/25/2019   Influenza, Seasonal, Injecte, Preservative Fre 09/30/2016   Influenza,inj,Quad PF,6+ Mos 09/12/2017   Pneumococcal Conjugate-13 06/10/2014   Pneumococcal Polysaccharide-23 01/16/2009, 08/12/2017   Td 08/21/2010   Zoster Recombinat (Shingrix) 09/30/2019, 01/11/2020   Zoster, Live 09/18/2016    TDAP status: Due, Education has been provided regarding the importance of this vaccine. Advised may receive this vaccine at local pharmacy or Health Dept. Aware to provide a copy of the vaccination record if obtained from local pharmacy or Health Dept. Verbalized acceptance and understanding.  Flu Vaccine status: Due, Education has been provided regarding the importance of this vaccine. Advised may receive this vaccine at local pharmacy or Health Dept. Aware to provide a copy of the vaccination record if obtained from local pharmacy or Health Dept.  Verbalized acceptance and understanding.  Pneumococcal vaccine status: Up to date  Covid-19 vaccine status: Declined, Education has been provided regarding the importance of this vaccine  but patient still declined. Advised may receive this vaccine at local pharmacy or Health Dept.or vaccine clinic. Aware to provide a copy of the vaccination record if obtained from local pharmacy or Health Dept. Verbalized acceptance and understanding.  Qualifies for Shingles Vaccine?  Yes  Patient had shot Zostavax completed Yes   Shingrix Completed?: Yes  Screening Tests Health Maintenance  Topic Date Due   Hepatitis C Screening  Never done   TETANUS/TDAP  08/21/2020   DEXA SCAN  Completed   PNA vac Low Risk Adult  Completed   Zoster Vaccines- Shingrix  Completed   HPV VACCINES  Aged Out   COVID-19 Vaccine  Discontinued    Health Maintenance  Health Maintenance Due  Topic Date Due   Hepatitis C Screening  Never done   TETANUS/TDAP  08/21/2020    Colorectal cancer screening: No longer required.   Mammogram status: No longer required due to Age.  Bone Density Status: Completed 12/19/2008  Lung Cancer Screening: (Low Dose CT Chest recommended if Age 27-80 years, 30 pack-year currently smoking OR have quit w/in 15years.) does not qualify.   Lung Cancer Screening Referral: NO  Additional Screening:  Hepatitis C Screening: does qualify; Not Completed   Vision Screening: Recommended annual ophthalmology exams for early detection of glaucoma and other disorders of the eye. Is the patient up to date with their annual eye exam?  Yes  Who is the provider or what is the name of the office in which the patient attends annual eye exams? N/A If pt is not established with a provider, would they like to be referred to a provider to establish care? Yes .   Dental Screening: Recommended annual dental exams for proper oral hygiene  Community Resource Referral / Chronic Care Management: CRR required  this visit?  No   CCM required this visit?  No      Plan:     I have personally reviewed and noted the following in the patient's chart:   Medical and social history Use of alcohol, tobacco or illicit drugs  Current medications and supplements including opioid prescriptions.  Functional ability and status Nutritional status Physical activity Advanced directives List of other physicians Hospitalizations, surgeries, and ER visits in previous 12 months Vitals Screenings to include cognitive, depression, and falls Referrals and appointments  In addition, I have reviewed and discussed with patient certain preventive protocols, quality metrics, and best practice recommendations. A written personalized care plan for preventive services as well as general preventive health recommendations were provided to patient.     Burgess Estelle, [facility]   08/22/2021   Nurse Notes: Non-Face to Face 45 minutes visit  Ms. Mooty , Thank you for taking time to come for your Medicare Wellness Visit. I appreciate your ongoing commitment to your health goals. Please review the following plan we discussed and let me know if I can assist you in the future.   These are the goals we discussed:  Goals   None     This is a list of the screening recommended for you and due dates:  Health Maintenance  Topic Date Due   Hepatitis C Screening: USPSTF Recommendation to screen - Ages 32-79 yo.  Never done   Tetanus Vaccine  08/21/2020   DEXA scan (bone density measurement)  Completed   Pneumonia vaccines  Completed   Zoster (Shingles) Vaccine  Completed   HPV Vaccine  Aged Out   COVID-19 Vaccine  Discontinued

## 2021-08-21 NOTE — Telephone Encounter (Signed)
Last filled 07-18-21 #120 Last OV 12-27-20 Next OV 01-02-22 [facility]

## 2021-09-12 DIAGNOSIS — G4733 Obstructive sleep apnea (adult) (pediatric): Secondary | ICD-10-CM | POA: Diagnosis not present

## 2021-09-12 DIAGNOSIS — I5032 Chronic diastolic (congestive) heart failure: Secondary | ICD-10-CM | POA: Diagnosis not present

## 2021-09-17 ENCOUNTER — Other Ambulatory Visit: Payer: Self-pay | Admitting: Internal Medicine

## 2021-09-17 NOTE — Telephone Encounter (Signed)
Last filled 08-21-21 #120 Last OV 02-14-21 Next OV 01-02-22 [facility]

## 2021-10-12 DIAGNOSIS — G4733 Obstructive sleep apnea (adult) (pediatric): Secondary | ICD-10-CM | POA: Diagnosis not present

## 2021-10-12 DIAGNOSIS — I5032 Chronic diastolic (congestive) heart failure: Secondary | ICD-10-CM | POA: Diagnosis not present

## 2021-10-16 ENCOUNTER — Other Ambulatory Visit: Payer: Self-pay | Admitting: Internal Medicine

## 2021-10-16 NOTE — Telephone Encounter (Signed)
Last filled 09-18-21 #120 Last OV 02-14-21 Next OV 01-02-22 [facility]

## 2021-10-18 ENCOUNTER — Other Ambulatory Visit: Payer: Self-pay | Admitting: Internal Medicine

## 2021-11-12 DIAGNOSIS — G4733 Obstructive sleep apnea (adult) (pediatric): Secondary | ICD-10-CM | POA: Diagnosis not present

## 2021-11-12 DIAGNOSIS — I5032 Chronic diastolic (congestive) heart failure: Secondary | ICD-10-CM | POA: Diagnosis not present

## 2021-11-19 ENCOUNTER — Other Ambulatory Visit: Payer: Self-pay | Admitting: Internal Medicine

## 2021-11-19 NOTE — Telephone Encounter (Signed)
Last filled 10-17-21 #120 Last OV 02-14-21 Next OV 01-02-22 [facility]

## 2021-12-12 DIAGNOSIS — I5032 Chronic diastolic (congestive) heart failure: Secondary | ICD-10-CM | POA: Diagnosis not present

## 2021-12-12 DIAGNOSIS — G4733 Obstructive sleep apnea (adult) (pediatric): Secondary | ICD-10-CM | POA: Diagnosis not present

## 2021-12-22 ENCOUNTER — Other Ambulatory Visit: Payer: Self-pay | Admitting: Internal Medicine

## 2021-12-24 NOTE — Telephone Encounter (Signed)
Last filled 11-19-21 #120 Last OV 02-14-21 Next OV 01-02-22 [facility]

## 2022-01-02 ENCOUNTER — Telehealth: Payer: Self-pay | Admitting: Pulmonary Disease

## 2022-01-02 ENCOUNTER — Encounter: Payer: Self-pay | Admitting: Internal Medicine

## 2022-01-02 ENCOUNTER — Ambulatory Visit (INDEPENDENT_AMBULATORY_CARE_PROVIDER_SITE_OTHER): Payer: Medicare Other | Admitting: Internal Medicine

## 2022-01-02 ENCOUNTER — Other Ambulatory Visit: Payer: Self-pay

## 2022-01-02 VITALS — BP 122/70 | HR 72 | Temp 97.0°F | Ht 62.5 in | Wt 261.0 lb

## 2022-01-02 DIAGNOSIS — J9611 Chronic respiratory failure with hypoxia: Secondary | ICD-10-CM | POA: Diagnosis not present

## 2022-01-02 DIAGNOSIS — I5032 Chronic diastolic (congestive) heart failure: Secondary | ICD-10-CM

## 2022-01-02 DIAGNOSIS — I10 Essential (primary) hypertension: Secondary | ICD-10-CM | POA: Diagnosis not present

## 2022-01-02 DIAGNOSIS — F1120 Opioid dependence, uncomplicated: Secondary | ICD-10-CM | POA: Insufficient documentation

## 2022-01-02 DIAGNOSIS — D696 Thrombocytopenia, unspecified: Secondary | ICD-10-CM | POA: Diagnosis not present

## 2022-01-02 DIAGNOSIS — M159 Polyosteoarthritis, unspecified: Secondary | ICD-10-CM | POA: Diagnosis not present

## 2022-01-02 DIAGNOSIS — F39 Unspecified mood [affective] disorder: Secondary | ICD-10-CM

## 2022-01-02 DIAGNOSIS — N1831 Chronic kidney disease, stage 3a: Secondary | ICD-10-CM | POA: Diagnosis not present

## 2022-01-02 LAB — HEPATIC FUNCTION PANEL
ALT: 9 U/L (ref 0–35)
AST: 17 U/L (ref 0–37)
Albumin: 4.7 g/dL (ref 3.5–5.2)
Alkaline Phosphatase: 95 U/L (ref 39–117)
Bilirubin, Direct: 0.2 mg/dL (ref 0.0–0.3)
Total Bilirubin: 1.1 mg/dL (ref 0.2–1.2)
Total Protein: 7.6 g/dL (ref 6.0–8.3)

## 2022-01-02 LAB — RENAL FUNCTION PANEL
Albumin: 4.7 g/dL (ref 3.5–5.2)
BUN: 15 mg/dL (ref 6–23)
CO2: 30 mEq/L (ref 19–32)
Calcium: 10 mg/dL (ref 8.4–10.5)
Chloride: 100 mEq/L (ref 96–112)
Creatinine, Ser: 0.94 mg/dL (ref 0.40–1.20)
GFR: 58.16 mL/min — ABNORMAL LOW (ref >60.00)
Glucose, Bld: 93 mg/dL (ref 70–99)
Phosphorus: 3.2 mg/dL (ref 2.3–4.6)
Potassium: 4.4 mEq/L (ref 3.5–5.1)
Sodium: 139 mEq/L (ref 135–145)

## 2022-01-02 LAB — CBC
HCT: 45.6 % (ref 36.0–46.0)
Hemoglobin: 14.7 g/dL (ref 12.0–15.0)
MCHC: 32.2 g/dL (ref 30.0–36.0)
MCV: 88 fl (ref 78.0–100.0)
Platelets: 138 10*3/uL — ABNORMAL LOW (ref 150.0–400.0)
RBC: 5.19 Mil/uL — ABNORMAL HIGH (ref 3.87–5.11)
RDW: 14.5 % (ref 11.5–15.5)
WBC: 5.1 10*3/uL (ref 4.0–10.5)

## 2022-01-02 NOTE — Progress Notes (Signed)
Hearing Screening - Comments:: Did not pass whisper test Vision Screening - Comments:: January 2022. Has appt next week.

## 2022-01-02 NOTE — Assessment & Plan Note (Signed)
Compensated Does use the furosemide 20mg  daily prn when holding fluid

## 2022-01-02 NOTE — Progress Notes (Addendum)
Subjective:    Patient ID: Autumn Price, female    DOB: 1941/04/26, 81 y.o.   MRN: [id number]  HPI Here for follow up of chronic health conditions Did have virtual wellness visit a few months ago  Having some right arm pain---from shoulder to above the elbow "Aches like a toothache" in bed Started about 2 weeks ago--no known injury Using voltaren cream---some help  Some pain in right knee at night Has had TKR Considering going back to Dr Thomasena Edis  Still uses the tramadol regularly----120 in a month  Severe SOB with walking---just walking in house Has had to stop work ---like mopping Didn't like Dr Rolene Course gone back It has worsened Just uses the albuterol prn Never got the CPAP Has oxygen (did have sig hypoxia at night)---but hasn't used it No chest pain--but does get some sense of needing to burp. No heartburn  Occasional edema---uses furosemide then Rare palpitations--nothing worrisome  BMI is 46--this has been stable for many years Tries to eat healthy Can't really exercise  Last GFR 57  Mood has been okay Mild down times---will only last 1-2 days every 2 weeks or so Doesn't get out much--but does enjoy sewing Grandson is living with her  No major abnormal bleeding Some easy bruising Known borderline low platelets  Current Outpatient Medications on File Prior to Visit  Medication Sig Dispense Refill   acetaminophen (TYLENOL) 500 MG tablet Take 500 mg by mouth every 6 (six) hours as needed for mild pain or moderate pain.     albuterol (VENTOLIN HFA) 108 (90 Base) MCG/ACT inhaler INHALE 1-2 PUFFS BY MOUTH INTO THE LUNGS EVERY 6 HOURS AS NEEDED FOR WHEEZING OR SHORTNESS OF BREATH 18 g 0   amLODipine (NORVASC) 5 MG tablet TAKE ONE TABLET BY MOUTH ONCE DAILY 90 tablet 3   Fexofenadine-Pseudoephedrine (ALLEGRA-D 24 HOUR PO) Take 1 tablet by mouth daily as needed.     furosemide (LASIX) 20 MG tablet TAKE ONE TABLET BY MOUTH ONCE DAILY 30 tablet 11    ketoconazole (NIZORAL) 2 % cream APPLY 1 APPLICATION TOPICALLY TO GROIN AREA 2 TIMES A DAY AS NEEDED FOR IRRITATION 30 g 3   MYRBETRIQ 50 MG TB24 tablet TAKE ONE TABLET BY MOUTH ONCE DAILY 90 tablet 3   traMADol (ULTRAM) 50 MG tablet TAKE TWO TABLETS BY MOUTH TWICE DAILY AS NEEDED 120 tablet 0   [DISCONTINUED] sertraline (ZOLOFT) 50 MG tablet TAKE 1 AND 1/2 TABLETS BY MOUTH ONCE DAILY 45 tablet 11   No current facility-administered medications on file prior to visit.    No Known Allergies  Past Medical History:  Diagnosis Date   Depression    Hypertension    Osteoarthritis    Urge incontinence    Venous insufficiency     Past Surgical History:  Procedure Laterality Date   APPENDECTOMY  1966   DILATION AND CURETTAGE OF UTERUS  01/27/14   benign   JOINT REPLACEMENT     right knee   TOTAL KNEE ARTHROPLASTY Right 03/20/2018   Procedure: RIGHT TOTAL KNEE ARTHROPLASTY;  Surgeon: Eugenia Mcalpine, MD;  Location: [facility];  Service: Orthopedics;  Laterality: Right;   TOTAL KNEE ARTHROPLASTY Left 09/11/2018   Procedure: LEFT TOTAL KNEE ARTHROPLASTY;  Surgeon: Eugenia Mcalpine, MD;  Location: [facility];  Service: Orthopedics;  Laterality: Left;   TUBAL LIGATION  1966    Family History  Problem Relation Age of Onset   Heart disease Father        died age 19-  MI   Alcohol abuse Brother    Heart disease Brother    Heart disease Sister    Breast cancer Neg Hx     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    Comment: [employer] finishing  Tobacco Use   Smoking status: Never   Smokeless tobacco: Never  Vaping Use   Vaping Use: Never used  Substance and Sexual Activity   Alcohol use: No   Drug use: No   Sexual activity: Not Currently  Other Topics Concern   Not on file   Social History Narrative   Has living will   Designated son Ronny Flurry and other children to make health care decisions for her   Would accept CPR/resuscitation   Hasn't considered tube feedings--would leave to son   Social Determinants of Health   Financial Resource Strain: Low Risk    Difficulty of Paying Living Expenses: Not hard at all  Food Insecurity: No Food Insecurity   Worried About Programme researcher, broadcasting/film/video in the Last Year: Never true   Ran Out of Food in the Last Year: Never true  Transportation Needs: No Transportation Needs   Lack of Transportation (Medical): No   Lack of Transportation (Non-Medical): No  Physical Activity: Insufficiently Active   Days of Exercise per Week: 4 days   Minutes of Exercise per Session: 10 min  Stress: No Stress Concern Present   Feeling of Stress : Only a little  Social Connections: Moderately Isolated   Frequency of Communication with Friends and Family: More than three times a week   Frequency of Social Gatherings with Friends and Family: Never   Attends Religious Services: 1 to 4 times per year   Active Member of [employer] or Organizations: No   Attends Engineer, structural: Never   Marital Status: Divorced  Catering manager Violence: Not At Risk   Fear of Current or Ex-Partner: No   Emotionally Abused: No   Physically Abused: No   Sexually Abused: No   Review of Systems Bowels are okay Some incontinence---despite the mybetriq No skin problems     Objective:   Physical Exam Constitutional:      Appearance: Normal appearance.  Cardiovascular:     Rate and Rhythm: Normal rate and regular rhythm.     Heart sounds: No murmur heard.   No gallop.     Comments: Faint pedal pulses Pulmonary:     Effort: Pulmonary effort is normal.     Breath sounds: Normal breath sounds. No wheezing or rales.  Abdominal:     Palpations: Abdomen is soft.     Tenderness: There is no abdominal tenderness.  Musculoskeletal:     Cervical  back: Neck supple.     Right lower leg: No edema.     Left lower leg: No edema.  Lymphadenopathy:     Cervical: No cervical adenopathy.  Skin:    Findings: No lesion or rash.  Neurological:     Mental Status: She is alert.  Psychiatric:        Mood and Affect: Mood normal.        Behavior: Behavior normal.           Assessment & Plan:

## 2022-01-02 NOTE — Assessment & Plan Note (Signed)
BP Readings from Last 3 Encounters:  01/02/22 122/70  07/06/21 (!) 126/56  02/14/21 136/78   Good control on amlodipine 5mg 

## 2022-01-02 NOTE — Assessment & Plan Note (Signed)
Mild Will just recheck labs 

## 2022-01-02 NOTE — Telephone Encounter (Signed)
Dr. Jayme Cloud and Dr. Craige Cotta, please advise if okay to switch?

## 2022-01-02 NOTE — Assessment & Plan Note (Signed)
Uses the tramadol 4 times most days Right shoulder exam suggests bursitis---discussed ice/diclofenac topical

## 2022-01-02 NOTE — Assessment & Plan Note (Signed)
PDMP reviewed No concerns 

## 2022-01-02 NOTE — Assessment & Plan Note (Addendum)
Known nocturnal hypoxia and symptoms suggest with any activity Asked her to start using the oxygen regularly Hasn't seen pulmonary--prefers someone new---will make referral

## 2022-01-02 NOTE — Telephone Encounter (Signed)
Okay with me 

## 2022-01-02 NOTE — Assessment & Plan Note (Signed)
BMI consistent in mid 40's She tries to eat healthy

## 2022-01-02 NOTE — Patient Instructions (Signed)
Please get your tetanus shot at the pharmacy. 

## 2022-01-02 NOTE — Assessment & Plan Note (Signed)
Some dysthymia--but mild No need for meds now

## 2022-01-03 DIAGNOSIS — D696 Thrombocytopenia, unspecified: Secondary | ICD-10-CM | POA: Insufficient documentation

## 2022-01-03 NOTE — Telephone Encounter (Signed)
Will await Dr. Gonzalez's response.  

## 2022-01-03 NOTE — Telephone Encounter (Signed)
Melissa, please schedule with Dr. Craige Cotta. Thanks

## 2022-01-03 NOTE — Telephone Encounter (Signed)
Okay with me 

## 2022-01-03 NOTE — Assessment & Plan Note (Signed)
Stable mildly low platelet count No action needed

## 2022-01-12 DIAGNOSIS — G4733 Obstructive sleep apnea (adult) (pediatric): Secondary | ICD-10-CM | POA: Diagnosis not present

## 2022-01-12 DIAGNOSIS — I5032 Chronic diastolic (congestive) heart failure: Secondary | ICD-10-CM | POA: Diagnosis not present

## 2022-01-17 ENCOUNTER — Other Ambulatory Visit: Payer: Self-pay | Admitting: Internal Medicine

## 2022-01-18 ENCOUNTER — Other Ambulatory Visit: Payer: Self-pay | Admitting: Internal Medicine

## 2022-01-21 ENCOUNTER — Other Ambulatory Visit: Payer: Self-pay | Admitting: Internal Medicine

## 2022-01-21 NOTE — Telephone Encounter (Signed)
Last filled 12-24-21 #120 Last OV 01-02-22 Next OV 07-03-22 [facility]

## 2022-02-12 DIAGNOSIS — I5032 Chronic diastolic (congestive) heart failure: Secondary | ICD-10-CM | POA: Diagnosis not present

## 2022-02-12 DIAGNOSIS — G4733 Obstructive sleep apnea (adult) (pediatric): Secondary | ICD-10-CM | POA: Diagnosis not present

## 2022-02-13 ENCOUNTER — Telehealth: Payer: Self-pay | Admitting: Internal Medicine

## 2022-02-13 NOTE — Telephone Encounter (Signed)
?  Encourage patient to contact the pharmacy for refills or they can request refills through [facility] ? ?Did the patient contact the pharmacy:  **this is a new pharmacy for this patient, [facility] is no longer open** ? ? ?LAST APPOINTMENT DATE:  1.18.23 ?NEXT APPOINTMENT DATE:  7.19.23 ? ?MEDICATION: traMADol (ULTRAM) 50 MG tablet ? ?Is the patient out of medication?  ? ?If not, how much is left? 2 day supply left  ? ?Is this a 90 day supply:  ? ?PHARMACY:  ?[facility] - [place], [place] - [address] Phone:  [phone]  ?Fax:  [phone]  ?  ? ? ?Let patient know to contact pharmacy at the end of the day to make sure medication is ready. ? ?Please notify patient to allow 48-72 hours to process ?  ?

## 2022-02-14 ENCOUNTER — Telehealth: Payer: Self-pay | Admitting: Internal Medicine

## 2022-02-14 MED ORDER — TRAMADOL HCL 50 MG PO TABS: ORAL_TABLET | ORAL | 0 refills | Status: DC

## 2022-02-14 NOTE — Telephone Encounter (Signed)
Rx sent 

## 2022-02-14 NOTE — Telephone Encounter (Signed)
Autumn Price with [employer] called stating that they need the fax that they sent for incontinent supply for pt faxed back to [phone]. Please advise. ?

## 2022-02-15 NOTE — Telephone Encounter (Signed)
error 

## 2022-02-15 NOTE — Telephone Encounter (Signed)
This has been faxed several times. 

## 2022-02-21 ENCOUNTER — Ambulatory Visit
Admission: RE | Admit: 2022-02-21 | Discharge: 2022-02-21 | Disposition: A | Discharge status: Home / Self Care | Payer: Medicare Other | Attending: Pulmonary Disease | Admitting: Pulmonary Disease

## 2022-02-21 ENCOUNTER — Other Ambulatory Visit
Admission: RE | Admit: 2022-02-21 | Discharge: 2022-02-21 | Disposition: A | Discharge status: Home / Self Care | Payer: Medicare Other | Source: Home / Self Care | Attending: Pulmonary Disease | Admitting: Pulmonary Disease

## 2022-02-21 ENCOUNTER — Ambulatory Visit (INDEPENDENT_AMBULATORY_CARE_PROVIDER_SITE_OTHER): Payer: Medicare Other | Admitting: Pulmonary Disease

## 2022-02-21 ENCOUNTER — Other Ambulatory Visit: Payer: Self-pay

## 2022-02-21 ENCOUNTER — Ambulatory Visit
Admission: RE | Admit: 2022-02-21 | Discharge: 2022-02-21 | Disposition: A | Discharge status: Home / Self Care | Payer: Medicare Other | Source: Ambulatory Visit | Attending: Pulmonary Disease | Admitting: Pulmonary Disease

## 2022-02-21 ENCOUNTER — Ambulatory Visit (INDEPENDENT_AMBULATORY_CARE_PROVIDER_SITE_OTHER): Payer: Medicare Other | Admitting: Cardiology

## 2022-02-21 ENCOUNTER — Encounter: Payer: Self-pay | Admitting: Cardiology

## 2022-02-21 ENCOUNTER — Encounter: Payer: Self-pay | Admitting: Pulmonary Disease

## 2022-02-21 VITALS — BP 128/74 | HR 78 | Temp 97.8°F | Ht 62.0 in | Wt 267.8 lb

## 2022-02-21 VITALS — BP 128/74 | HR 77 | Ht 62.0 in | Wt 267.0 lb

## 2022-02-21 DIAGNOSIS — R0609 Other forms of dyspnea: Secondary | ICD-10-CM

## 2022-02-21 DIAGNOSIS — J40 Bronchitis, not specified as acute or chronic: Secondary | ICD-10-CM

## 2022-02-21 DIAGNOSIS — I5030 Unspecified diastolic (congestive) heart failure: Secondary | ICD-10-CM | POA: Diagnosis not present

## 2022-02-21 DIAGNOSIS — R0600 Dyspnea, unspecified: Secondary | ICD-10-CM | POA: Diagnosis not present

## 2022-02-21 DIAGNOSIS — J9811 Atelectasis: Secondary | ICD-10-CM | POA: Diagnosis not present

## 2022-02-21 DIAGNOSIS — D7218 Eosinophilia in diseases classified elsewhere: Secondary | ICD-10-CM | POA: Diagnosis not present

## 2022-02-21 DIAGNOSIS — R600 Localized edema: Secondary | ICD-10-CM

## 2022-02-21 DIAGNOSIS — I10 Essential (primary) hypertension: Secondary | ICD-10-CM | POA: Diagnosis not present

## 2022-02-21 LAB — COMPREHENSIVE METABOLIC PANEL
ALT: 12 U/L (ref 0–44)
AST: 21 U/L (ref 15–41)
Albumin: 4.3 g/dL (ref 3.5–5.0)
Alkaline Phosphatase: 88 U/L (ref 38–126)
Anion gap: 7 (ref 5–15)
BUN: 14 mg/dL (ref 8–23)
CO2: 28 mmol/L (ref 22–32)
Calcium: 9.5 mg/dL (ref 8.9–10.3)
Chloride: 106 mmol/L (ref 98–111)
Creatinine, Ser: 0.77 mg/dL (ref 0.44–1.00)
GFR, Estimated: >60 mL/min (ref >60)
Glucose, Bld: 125 mg/dL — ABNORMAL HIGH (ref 70–99)
Potassium: 4.2 mmol/L (ref 3.5–5.1)
Sodium: 141 mmol/L (ref 135–145)
Total Bilirubin: 0.8 mg/dL (ref 0.3–1.2)
Total Protein: 8 g/dL (ref 6.5–8.1)

## 2022-02-21 LAB — CBC WITH DIFFERENTIAL/PLATELET
Abs Immature Granulocytes: 0.02 10*3/uL (ref 0.00–0.07)
Basophils Absolute: 0.1 10*3/uL (ref 0.0–0.1)
Basophils Relative: 1 %
Eosinophils Absolute: 0.1 10*3/uL (ref 0.0–0.5)
Eosinophils Relative: 2 %
HCT: 45.5 % (ref 36.0–46.0)
Hemoglobin: 14.1 g/dL (ref 12.0–15.0)
Immature Granulocytes: 0 %
Lymphocytes Relative: 19 %
Lymphs Abs: 1.1 10*3/uL (ref 0.7–4.0)
MCH: 27.6 pg (ref 26.0–34.0)
MCHC: 31 g/dL (ref 30.0–36.0)
MCV: 89.2 fL (ref 80.0–100.0)
Monocytes Absolute: 0.5 10*3/uL (ref 0.1–1.0)
Monocytes Relative: 8 %
Neutro Abs: 4.1 10*3/uL (ref 1.7–7.7)
Neutrophils Relative %: 70 %
Platelets: 166 10*3/uL (ref 150–400)
RBC: 5.1 MIL/uL (ref 3.87–5.11)
RDW: 13.1 % (ref 11.5–15.5)
WBC: 5.8 10*3/uL (ref 4.0–10.5)
nRBC: 0 % (ref 0.0–0.2)

## 2022-02-21 MED ORDER — BUDESONIDE-FORMOTEROL FUMARATE 80-4.5 MCG/ACT IN AERO: 2.0000 | INHALATION_SPRAY | Freq: Two times a day (BID) | RESPIRATORY_TRACT | 12 refills | Status: DC

## 2022-02-21 NOTE — Patient Instructions (Signed)
Medication Instructions:  ? ?Your physician recommends that you continue on your current medications as directed. Please refer to the Current Medication list given to you today.  ? ?*If you need a refill on your cardiac medications before your next appointment, please call your pharmacy* ? ? ?Lab Work: ?None ordered ? ?If you have labs (blood work) drawn today and your tests are completely normal, you will receive your results only by: ?MyChart Message (if you have MyChart) OR ?A paper copy in the mail ?If you have any lab test that is abnormal or we need to change your treatment, we will call you to review the results. ? ? ?Testing/Procedures: ?None ordered ? ? ?Follow-Up: ?At [facility], you and your health needs are our priority.  As part of our continuing mission to provide you with exceptional heart care, we have created designated Provider Care Teams.  These Care Teams include your primary Cardiologist (physician) and Advanced Practice Providers (APPs -  Physician Assistants and Nurse Practitioners) who all work together to provide you with the care you need, when you need it. ? ?We recommend signing up for the patient portal called "MyChart".  Sign up information is provided on this After Visit Summary.  MyChart is used to connect with patients for Virtual Visits (Telemedicine).  Patients are able to view lab/test results, encounter notes, upcoming appointments, etc.  Non-urgent messages can be sent to your provider as well.   ?To learn more about what you can do with MyChart, go to [email or web address].   ? ?Your next appointment:   ?3 month(s) ? ?The format for your next appointment:   ?In Person ? ?Provider:   ?You may see Kate Sable, MD or one of the following Advanced Practice Providers on your designated Care Team:   ?Murray Hodgkins, NP ?Christell Faith, PA-C ?Cadence Kathlen Mody, PA-C ? ? ?Other Instructions ?Referrals have been sent to nutrition services and Vascular. They will call you to set up  these appointments.  ?

## 2022-02-21 NOTE — Progress Notes (Signed)
? ?[facility] ? ?Chief Complaint  ?Patient presents with  ? Follow-up  ?  Sob with exertion, prod cough with yellow to green sputum and wheezing mainly at night. Unable to tolerate cpap or oxygen at night.   ? ? ?Past Surgical History:  ?She  has a past surgical history that includes Appendectomy (1966); Tubal ligation (1966); Dilation and curettage of uterus (01/27/14); Total knee arthroplasty (Right, 03/20/2018); Joint replacement; and Total knee arthroplasty (Left, 09/11/2018). ? ?Past Medical History:  ?Depression, HTN, OA ? ?Constitutional:  ?BP 128/74 (BP Location: Left Arm, Cuff Size: Large)   Pulse 78   Temp 97.8 ?F (36.6 ?C) (Temporal)   Ht 5\' 2"  (1.575 m)   Wt 267 lb 12.8 oz (121.5 kg)   SpO2 97%   BMI 48.98 kg/m?  ? ?Brief Summary:  ?Autumn Price is a 81 y.o. female with dyspnea on exertion.  She has hx of sleep apnea, but intolerant of CPAP. ?  ? ? ? ?Subjective:  ? ?She is here with her grandson. ? ?She was previously seen by Dr. Patsey Berthold. ? ?She wasn't able to use her previous inhaler.  This was a dry powder inhaler.  When she used this she felt throat irritation and made her feel strangled.  She has been using albuterol and this helps.  She has cough with clear sputum, and gets intermittent wheezing. ? ?She gets winded after walking about 40 feet.  She gets swelling in her legs at night.  She never smoked cigarettes. ? ?She has oxygen set up at home.  She uses whenever she feels short of breath.  Her breathing recovers after resting for a few minutes.   ? ?Did ambulatory oximetry in office today.  She had heart rate drop into the 40's while walking and she became short of breath. ? ?Physical Exam:  ? ?Appearance - well kempt  ? ?ENMT - no sinus tenderness, no oral exudate, no LAN, Mallampati 3 airway, no stridor ? ?Respiratory - equal breath sounds bilaterally, no wheezing or rales ? ?CV - s1s2 regular rate and rhythm, no murmurs ? ?Ext - no clubbing, no  edema ? ?Skin - no rashes ? ?Psych - normal mood and affect ?  ?Pulmonary testing:  ?PFT 06/27/20 >> FEV1 1.98 (93%), FEV1% 75, TLC 4.90 (75%) ? ?Chest Imaging:  ?CT angio chest 10/08/19 >> small HH, subsegmental ATX ? ?Sleep Tests:  ?HST 10/09/20 >> AHI 15.5, SpO2 low 81% ? ?Cardiac Tests:  ?Echo 10/13/19 >> EF 60 to 65% ? ?Social History:  ?She  reports that she has never smoked. She has never used smokeless tobacco. She reports that she does not drink alcohol and does not use drugs. ? ?Family History:  ?Her family history includes Alcohol abuse in her brother; Heart disease in her brother, father, and sister. ?  ? ? ?Assessment/Plan:  ? ?Dyspnea on exertion. ?- she had bradycardia associated with dyspnea while walking in office today; scheduled for cardiology appointment later this morning ?- will arrange for lab work and chest xray ? ?Eosinophilic bronchitis. ?- intolerant of DPI's ?- will have her use symbicort 80 two puffs bid with a spacer device ?- prn albuterol with a spacer device ? ?Chronic respiratory failure with hypoxia. ?- continue 2 liters oxygen at night and with exertion ?- goal SpO2 > 90% ?- she has pulse oximeter at home  ? ?Time Spent Involved in Patient Care on Day of Examination:  ?44 minutes ? ?Follow  up:  ? ?Patient Instructions  ?Chest xray and lab tests today ? ?Symbicort two puffs in the morning and two puffs in the evening, and rinse your mouth after each use. ? ?Albuterol two puffs every 6 hours as needed for cough, wheeze, or chest congestion. ? ?Use the spacer device when you use symbicort or albuterol. ? ?Your goal oxygen level is > 90%. ? ?Follow up in 6 to 8 weeks with Dr. Halford Chessman or Nurse Practitioner. ? ?Medication List:  ? ?Allergies as of 02/21/2022   ?No Known Allergies ?  ? ?  ?Medication List  ?  ? ?  ? Accurate as of February 21, 2022 10:30 AM. If you have any questions, ask your nurse or doctor.  ?  ?  ? ?  ? ?acetaminophen 500 MG tablet ?Commonly known as: TYLENOL ?Take 500 mg by  mouth every 6 (six) hours as needed for mild pain or moderate pain. ?  ?albuterol 108 (90 Base) MCG/ACT inhaler ?Commonly known as: VENTOLIN HFA ?INHALE 1-2 PUFFS BY MOUTH INTO THE LUNGS EVERY 6 HOURS AS NEEDED FOR WHEEZING OR SHORTNESS OF BREATH ?  ?ALLEGRA-D 24 HOUR PO ?Take 1 tablet by mouth daily as needed. ?  ?amLODipine 5 MG tablet ?Commonly known as: NORVASC ?TAKE ONE TABLET BY MOUTH ONCE DAILY ?  ?budesonide-formoterol 80-4.5 MCG/ACT inhaler ?Commonly known as: Symbicort ?Inhale 2 puffs into the lungs in the morning and at bedtime. ?Started by: Chesley Mires, MD ?  ?furosemide 20 MG tablet ?Commonly known as: LASIX ?TAKE ONE TABLET BY MOUTH ONCE DAILY ?  ?ketoconazole 2 % cream ?Commonly known as: NIZORAL ?APPLY 1 APPLICATION TOPICALLY TO GROIN AREA 2 TIMES A DAY AS NEEDED FOR IRRITATION ?  ?Myrbetriq 50 MG Tb24 tablet ?Generic drug: mirabegron ER ?TAKE ONE TABLET BY MOUTH ONCE DAILY ?  ?traMADol 50 MG tablet ?Commonly known as: ULTRAM ?TAKE TWO TABLETS BY MOUTH TWICE DAILY AS NEEDED ?  ? ?  ? ? ?Signature:  ?Chesley Mires, MD ?[place] ?Pager - [phone] - [phone] ?02/21/2022, 10:30 AM ?  ? ? ? ? ? ? ? ? ?

## 2022-02-21 NOTE — Patient Instructions (Signed)
Chest xray and lab tests today ? ?Symbicort two puffs in the morning and two puffs in the evening, and rinse your mouth after each use. ? ?Albuterol two puffs every 6 hours as needed for cough, wheeze, or chest congestion. ? ?Use the spacer device when you use symbicort or albuterol. ? ?Your goal oxygen level is > 90%. ? ?Follow up in 6 to 8 weeks with Dr. Craige Cotta or Nurse Practitioner. ?

## 2022-02-21 NOTE — Progress Notes (Signed)
Cardiology Office Note:    Date:  02/21/2022   ID:  Autumn Price, DOB 03-Oct-1941, MRN [id number]  PCP:  Karie Schwalbe, MD  [facility] Cardiologist:  Debbe Odea, MD  [facility] Electrophysiologist:  None   Referring MD: Karie Schwalbe, MD   Chief Complaint  Patient presents with   OTher    Past due follow up -- Patient c.o increased DOE, Swelling in ankles, and chest tightness. Meds reviewed verbally with patient.     History of Present Illness:    Autumn Price is a 81 y.o. female with a hx of hypertension, obesity, OSA who presents for follow-up.    Previously seen over a year ago who presents due to shortness of breath on exertion.  Due to risk factors, Lexiscan Myoview was obtained to evaluate any significant ischemia.  Previous echocardiogram was normal.  Follows up with pulmonary medicine for obesity hypoventilation syndrome.  Takes Lasix for leg edema.  She was diagnosed with OSA in the past, did not tolerate CPAP mask, noncompliant.  Prior notes Echo 09/2019 EF 60 to 65%, diastolic function normal Lexiscan Myoview 10/2020 no evidence for ischemia, minimal coronary artery calcification noted father had an MI in his 58s.  Previously had lower extremity edema which is improved with Lasix.  Patient previously saw pulmonary medicine, had PFTs done and were more consistent with obesity/obesity hypoventilation. No evidence of obstruction or restriction noted.    Past Medical History:  Diagnosis Date   Depression    Hypertension    Osteoarthritis    Urge incontinence    Venous insufficiency     Past Surgical History:  Procedure Laterality Date   APPENDECTOMY  1966   DILATION AND CURETTAGE OF UTERUS  01/27/14   benign   JOINT REPLACEMENT     right knee   TOTAL KNEE ARTHROPLASTY Right 03/20/2018   Procedure: RIGHT TOTAL KNEE ARTHROPLASTY;  Surgeon: Eugenia Mcalpine, MD;  Location: [facility];  Service: Orthopedics;  Laterality: Right;   TOTAL KNEE  ARTHROPLASTY Left 09/11/2018   Procedure: LEFT TOTAL KNEE ARTHROPLASTY;  Surgeon: Eugenia Mcalpine, MD;  Location: [facility];  Service: Orthopedics;  Laterality: Left;   TUBAL LIGATION  1966    Current Medications: Current Meds  Medication Sig   acetaminophen (TYLENOL) 500 MG tablet Take 500 mg by mouth every 6 (six) hours as needed for mild pain or moderate pain.   albuterol (VENTOLIN HFA) 108 (90 Base) MCG/ACT inhaler INHALE 1-2 PUFFS BY MOUTH INTO THE LUNGS EVERY 6 HOURS AS NEEDED FOR WHEEZING OR SHORTNESS OF BREATH   amLODipine (NORVASC) 5 MG tablet TAKE ONE TABLET BY MOUTH ONCE DAILY   budesonide-formoterol (SYMBICORT) 80-4.5 MCG/ACT inhaler Inhale 2 puffs into the lungs in the morning and at bedtime.   Fexofenadine-Pseudoephedrine (ALLEGRA-D 24 HOUR PO) Take 1 tablet by mouth daily as needed.   furosemide (LASIX) 20 MG tablet TAKE ONE TABLET BY MOUTH ONCE DAILY   ketoconazole (NIZORAL) 2 % cream APPLY 1 APPLICATION TOPICALLY TO GROIN AREA 2 TIMES A DAY AS NEEDED FOR IRRITATION   MYRBETRIQ 50 MG TB24 tablet TAKE ONE TABLET BY MOUTH ONCE DAILY   traMADol (ULTRAM) 50 MG tablet TAKE TWO TABLETS BY MOUTH TWICE DAILY AS NEEDED     Allergies:   Patient has no known allergies.   Social History   Socioeconomic History   Marital status: Divorced    Spouse name: Not on file   Number of children: 3   Years of education:  Not on file   Highest education level: Not on file  Occupational History   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    Comment: [employer]  Tobacco Use   Smoking status: Never   Smokeless tobacco: Never  Vaping Use   Vaping Use: Never used  Substance and Sexual Activity   Alcohol use: No   Drug use: No   Sexual activity: Not Currently  Other Topics Concern   Not on file  Social History Narrative   Has living will   Designated son [name] and other children to make health care decisions for her   Would accept CPR/resuscitation   Hasn't considered tube  feedings--would leave to son   Social Determinants of Health   Financial Resource Strain: Low Risk    Difficulty of Paying Living Expenses: Not hard at all  Food Insecurity: No Food Insecurity   Worried About Programme researcher, broadcasting/film/videounning Out of Food in the Last Year: Never true   Ran Out of Food in the Last Year: Never true  Transportation Needs: No Transportation Needs   Lack of Transportation (Medical): No   Lack of Transportation (Non-Medical): No  Physical Activity: Insufficiently Active   Days of Exercise per Week: 4 days   Minutes of Exercise per Session: 10 min  Stress: No Stress Concern Present   Feeling of Stress : Only a little  Social Connections: Moderately Isolated   Frequency of Communication with Friends and Family: More than three times a week   Frequency of Social Gatherings with Friends and Family: Never   Attends Religious Services: 1 to 4 times per year   Active Member of [employer] FinancialClubs or Organizations: No   Attends Engineer, structuralClub or Organization Meetings: Never   Marital Status: Divorced     Family History: The patient's family history includes Alcohol abuse in her brother; Heart disease in her brother, father, and sister. There is no history of Breast cancer.  ROS:   Please see the history of present illness.     All other systems reviewed and are negative.  EKGs/Labs/Other Studies Reviewed:    The following studies were reviewed today:   EKG:  EKG is  ordered today.  The ekg ordered today demonstrates normal sinus rhythm, normal ECG.  Recent Labs: 01/02/2022: ALT 9; BUN 15; Creatinine, Ser 0.94; Potassium 4.4; Sodium 139 02/21/2022: Hemoglobin 14.1; Platelets 166  Recent Lipid Panel    Component Value Date/Time   CHOL 185 08/09/2016 1037   TRIG 116.0 08/09/2016 1037   HDL 59.10 08/09/2016 1037   CHOLHDL 3 08/09/2016 1037   VLDL 23.2 08/09/2016 1037   LDLCALC 102 (H) 08/09/2016 1037     Risk Assessment/Calculations:      Physical Exam:    VS:  BP 128/74 (BP Location: Left  Arm, Patient Position: Sitting, Cuff Size: Large)    Pulse 77    Ht 5\' 2"  (1.575 m)    Wt 267 lb (121.1 kg)    SpO2 97%    BMI 48.83 kg/m     Wt Readings from Last 3 Encounters:  02/21/22 267 lb (121.1 kg)  02/21/22 267 lb 12.8 oz (121.5 kg)  01/02/22 261 lb (118.4 kg)     GEN:  Well nourished, well developed in no acute distress HEENT: Normal NECK: No JVD; No carotid bruits LYMPHATICS: No lymphadenopathy CARDIAC: RRR, no murmurs, rubs, gallops RESPIRATORY:  Clear to auscultation without rales, wheezing or rhonchi  ABDOMEN: Soft, non-tender, distended MUSCULOSKELETAL: Leg edema/lymphedema noted SKIN: Warm and dry NEUROLOGIC:  Alert and oriented x 3 PSYCHIATRIC:  Normal affect   ASSESSMENT:    1. Heart failure with preserved ejection fraction, unspecified HF chronicity (HCC)   2. Dyspnea on exertion   3. Primary hypertension   4. Morbid obesity (HCC)   5. Leg edema     PLAN:    In order of problems listed above:  Patient with worsening dyspnea on exertion.  Consistent with heart failure preserved EF.  Echo 09/2019 showed normal systolic and diastolic function, EF 60 to 65%.  Lexiscan Myoview 10/2020 low risk study, no evidence for ischemia .  Untreated OSA, morbid obesity/deconditioning likely reason for dyspnea on exertion.   Hypertension, BP controlled.  Continue amlodipine 5 mg daily. Obesity, low-calorie diet, weight loss advised.  Refer to nutrition services as patient has debilitating CHF symptoms.  If patient keeps appointment and follows strict diet control, consider starting Ozempic to help with weight loss at follow-up visit. Leg edema consistent with lymphedema.  Refer to vein clinic.  Follow-up in 3 months.   Medication Adjustments/Labs and Tests Ordered: Current medicines are reviewed at length with the patient today.  Concerns regarding medicines are outlined above.  Orders Placed This Encounter  Procedures   Referral to Nutrition and Diabetes Services    Ambulatory referral to Vascular Surgery   EKG 12-Lead   No orders of the defined types were placed in this encounter.   Patient Instructions  Medication Instructions:   Your physician recommends that you continue on your current medications as directed. Please refer to the Current Medication list given to you today.   *If you need a refill on your cardiac medications before your next appointment, please call your pharmacy*   Lab Work: None ordered  If you have labs (blood work) drawn today and your tests are completely normal, you will receive your results only by: MyChart Message (if you have MyChart) OR A paper copy in the mail If you have any lab test that is abnormal or we need to change your treatment, we will call you to review the results.   Testing/Procedures: None ordered   Follow-Up: At [facility], you and your health needs are our priority.  As part of our continuing mission to provide you with exceptional heart care, we have created designated Provider Care Teams.  These Care Teams include your primary Cardiologist (physician) and Advanced Practice Providers (APPs -  Physician Assistants and Nurse Practitioners) who all work together to provide you with the care you need, when you need it.  We recommend signing up for the patient portal called "MyChart".  Sign up information is provided on this After Visit Summary.  MyChart is used to connect with patients for Virtual Visits (Telemedicine).  Patients are able to view lab/test results, encounter notes, upcoming appointments, etc.  Non-urgent messages can be sent to your provider as well.   To learn more about what you can do with MyChart, go to [email or web address].    Your next appointment:   3 month(s)  The format for your next appointment:   In Person  Provider:   You may see Debbe Odea, MD or one of the following Advanced Practice Providers on your designated Care Team:   Nicolasa Ducking,  NP Eula Listen, PA-C Cadence Fransico Michael, [place]   Other Instructions Referrals have been sent to nutrition services and Vascular. They will call you to set up these appointments.    Signed, Debbe Odea, MD  02/21/2022 12:11 PM    [facility]

## 2022-02-24 LAB — IGE: IgE (Immunoglobulin E), Serum: 8 IU/mL (ref 6–495)

## 2022-03-12 DIAGNOSIS — I5032 Chronic diastolic (congestive) heart failure: Secondary | ICD-10-CM | POA: Diagnosis not present

## 2022-03-12 DIAGNOSIS — G4733 Obstructive sleep apnea (adult) (pediatric): Secondary | ICD-10-CM | POA: Diagnosis not present

## 2022-03-14 ENCOUNTER — Telehealth: Payer: Self-pay | Admitting: Pulmonary Disease

## 2022-03-14 ENCOUNTER — Other Ambulatory Visit: Payer: Self-pay | Admitting: Internal Medicine

## 2022-03-14 NOTE — Telephone Encounter (Signed)
Last filled 02-16-22 #120 ?Last OV 01-02-22 ?Next OV 07-03-22 ?[facility] ?

## 2022-03-14 NOTE — Telephone Encounter (Signed)
Called and spoke to patient who states she has not received spacer from DME order placed in beginning  of march.  ? ?Synetta Fail can you check on this? ?

## 2022-03-14 NOTE — Telephone Encounter (Signed)
Spoke with the pharmacist pt got this filled on 02/16/22 and it written on 02/14/22 . She hasn't had filled since the qty #120. Pt just requested to to soon. Per the Pharmacist  ?

## 2022-03-18 NOTE — Telephone Encounter (Signed)
I called [facility] and spoke with Terri C. She stated that they do not provide these anymore and that the Pharmacy where the patient gets their inhalers should be able to provide one with the inhaler. She stated that she would call the patient and explain this to her. She can buy them cheaper then getting them from DME ?

## 2022-04-12 DIAGNOSIS — I5032 Chronic diastolic (congestive) heart failure: Secondary | ICD-10-CM | POA: Diagnosis not present

## 2022-04-12 DIAGNOSIS — G4733 Obstructive sleep apnea (adult) (pediatric): Secondary | ICD-10-CM | POA: Diagnosis not present

## 2022-04-16 ENCOUNTER — Other Ambulatory Visit: Payer: Self-pay | Admitting: Internal Medicine

## 2022-04-17 NOTE — Telephone Encounter (Signed)
Last filled 03-14-22 #120 ?Last OV 01-02-22 ?Next OV 07-03-22 ?[facility] ?

## 2022-04-18 ENCOUNTER — Ambulatory Visit: Payer: Medicare Other | Admitting: Cardiology

## 2022-04-18 ENCOUNTER — Telehealth: Payer: Self-pay | Admitting: Internal Medicine

## 2022-04-18 ENCOUNTER — Ambulatory Visit: Payer: Medicare Other | Admitting: Pulmonary Disease

## 2022-04-18 MED ORDER — SERTRALINE HCL 50 MG PO TABS: 50.0000 mg | ORAL_TABLET | Freq: Every day | ORAL | 0 refills | Status: DC

## 2022-04-18 NOTE — Telephone Encounter (Signed)
Spoke to pt. She was saying she took one of her grandson's alprazolam and that helped her a little bit. I told her if she is not feeling better after a few weeks on sertraline, make an OV to discuss other options. ?

## 2022-04-18 NOTE — Telephone Encounter (Signed)
Spoke to pt. She said she has had stuff going on and the last 2 weeks she cries at anything. She was tearful when I called her. She was hoping to go back on sertraline and discuss it more at her July appt. ?

## 2022-04-18 NOTE — Telephone Encounter (Signed)
Pt called because she get a script for pads for the bed and pull ups, she said the script ran out and the pharmacy told her to call, she is almost out. She also wants to know if she can get a refill on sertraline because she has had a bad few days. [facility], [address] # [place], [place] [address]. Call back for pt is [phone] ?

## 2022-04-30 ENCOUNTER — Telehealth: Payer: Self-pay

## 2022-04-30 NOTE — Telephone Encounter (Signed)
Pt called in to follow-up on a form from [employer] that should have come in for Dr. Alphonsus Sias to sign off on for pt's depends and chuck pads.  ?

## 2022-04-30 NOTE — Telephone Encounter (Signed)
Pt called back and states that she called the company and they won't resend anything without talking to providers office first. [employer] phone #: [phone] ?

## 2022-04-30 NOTE — Telephone Encounter (Signed)
Spoke to pt. Advised her I have not seen anything from them. I was going to send an old form and change the dates, but it was not legible. She will call them. ?

## 2022-05-01 NOTE — Telephone Encounter (Addendum)
Called and spoke to [employer]. Asked them to fax a new request as we have never received 1 from them for now. Called pt to let her know. ?

## 2022-05-09 NOTE — Telephone Encounter (Signed)
Pt called and said that [employer] called her and told her that they did receive the order but said that 2 questions weren't filled out on there in order for them to complete the order and asked [place] to fill out those questions and resend. They said it was for question 19 and 20 and said it was missing a signature

## 2022-05-09 NOTE — Telephone Encounter (Signed)
Spoke to pt. Form has been corrected and I did not see a place that was missing a signature.

## 2022-05-12 DIAGNOSIS — I5032 Chronic diastolic (congestive) heart failure: Secondary | ICD-10-CM | POA: Diagnosis not present

## 2022-05-12 DIAGNOSIS — G4733 Obstructive sleep apnea (adult) (pediatric): Secondary | ICD-10-CM | POA: Diagnosis not present

## 2022-05-18 ENCOUNTER — Telehealth: Payer: Self-pay | Admitting: Internal Medicine

## 2022-05-20 NOTE — Telephone Encounter (Signed)
Last filled 04-16-22 #120 Last OV 01-02-22 Next OV 07-03-22 [facility]

## 2022-06-03 ENCOUNTER — Other Ambulatory Visit: Payer: Self-pay

## 2022-06-03 ENCOUNTER — Telehealth: Payer: Self-pay | Admitting: Pulmonary Disease

## 2022-06-03 ENCOUNTER — Ambulatory Visit (INDEPENDENT_AMBULATORY_CARE_PROVIDER_SITE_OTHER): Payer: Medicare Other

## 2022-06-03 ENCOUNTER — Ambulatory Visit
Admission: EM | Admit: 2022-06-03 | Discharge: 2022-06-03 | Disposition: A | Discharge status: Home / Self Care | Payer: Medicare Other | Attending: Physician Assistant | Admitting: Physician Assistant

## 2022-06-03 DIAGNOSIS — R051 Acute cough: Secondary | ICD-10-CM

## 2022-06-03 DIAGNOSIS — R059 Cough, unspecified: Secondary | ICD-10-CM | POA: Diagnosis not present

## 2022-06-03 MED ORDER — PREDNISONE 20 MG PO TABS: 20.0000 mg | ORAL_TABLET | Freq: Every day | ORAL | 0 refills | Status: DC

## 2022-06-03 MED ORDER — BENZONATATE 200 MG PO CAPS: 200.0000 mg | ORAL_CAPSULE | Freq: Two times a day (BID) | ORAL | 0 refills | Status: DC | PRN

## 2022-06-03 NOTE — Telephone Encounter (Signed)
Spoke to patient an relayed below message/recommendations.  No availability in [place]. She would like go to UC here in [place] vs driving to [place] for an appt.  Nothing further needed.  Routing to Dr. Craige Cotta as an [place]ain Childes.

## 2022-06-03 NOTE — Telephone Encounter (Addendum)
Spoke to patient.  She stated that she developed a cough 2 weeks ago. Last night she coughed up a small amount of bright red blood. The amount was smaller then the size of a dime.  She used albuterol this morning and cough improved. SOB is baseline.  Denied f/c/s or additional sx.   Dr. Craige Cotta, please advise. Thanks

## 2022-06-03 NOTE — Telephone Encounter (Signed)
She needs to come in to get a chest xray and ROV.

## 2022-06-03 NOTE — Discharge Instructions (Addendum)
You are having bronchospasm and the prednisone should help with that

## 2022-06-03 NOTE — ED Provider Notes (Addendum)
[facility]    CSN: [id number] Arrival date & time: 06/03/22  1416      History   Chief Complaint Chief Complaint  Patient presents with   Cough    HPI Autumn Price is a 81 y.o. female who presents with hx of cough for ...        Weeks. Last night she noticed a little blood in the sputum and thinks she just irritated her throat, but when she talked with her pulmonologist office ,was told to have a CXR. Her SOB is not any worse than usual. Her cough is mostly non productive and when she coughs something is white. Has not had a fever, chills or sweats. Has been wheezing specially at night time and been using the Symbicort qd and Albuterol inhaler. Last night had a lot of cough attacks and could not rest.     Past Medical History:  Diagnosis Date   Depression    Hypertension    Osteoarthritis    Urge incontinence    Venous insufficiency     Patient Active Problem List   Diagnosis Date Noted   Thrombocytopenia (HCC) 01/03/2022   Chronic hypoxemic respiratory failure (HCC) 01/02/2022   Stage 3a chronic kidney disease (HCC) 01/02/2022   Chronic narcotic dependence (HCC) 01/02/2022   Persistent cough 05/11/2020   Chronic diastolic heart failure (HCC) 10/25/2019   DOE (dyspnea on exertion) 10/11/2019   Primary osteoarthritis of right knee 03/20/2018   S/P knee replacement 03/20/2018   Osteoarthritis of left knee 08/30/2016   Advance directive discussed with patient 08/04/2015   Mood disorder (HCC) 06/10/2014   Morbid obesity (HCC) 05/22/2012   Routine general medical examination at a health care facility 03/19/2011   Mixed stress and urge urinary incontinence 03/19/2011   Essential hypertension, benign 08/21/2010   Chronic venous insufficiency 08/21/2010   Osteoarthritis, multiple sites 08/21/2010    Past Surgical History:  Procedure Laterality Date   APPENDECTOMY  1966   DILATION AND CURETTAGE OF UTERUS  01/27/14   benign   JOINT REPLACEMENT     right  knee   TOTAL KNEE ARTHROPLASTY Right 03/20/2018   Procedure: RIGHT TOTAL KNEE ARTHROPLASTY;  Surgeon: Eugenia Mcalpine, MD;  Location: [facility];  Service: Orthopedics;  Laterality: Right;   TOTAL KNEE ARTHROPLASTY Left 09/11/2018   Procedure: LEFT TOTAL KNEE ARTHROPLASTY;  Surgeon: Eugenia Mcalpine, MD;  Location: [facility];  Service: Orthopedics;  Laterality: Left;   TUBAL LIGATION  1966    OB History   No obstetric history on file.      Home Medications    Prior to Admission medications   Medication Sig Start Date End Date Taking? Authorizing Provider  benzonatate (TESSALON) 200 MG capsule Take 1 capsule (200 mg total) by mouth 2 (two) times daily as needed for cough. 06/03/22  Yes Rodriguez-Southworth, Nettie Elm, PA-C  predniSONE (DELTASONE) 20 MG tablet Take 1 tablet (20 mg total) by mouth daily with breakfast. 06/03/22  Yes Rodriguez-Southworth, Nettie Elm, PA-C  acetaminophen (TYLENOL) 500 MG tablet Take 500 mg by mouth every 6 (six) hours as needed for mild pain or moderate pain.    [provider]  albuterol (VENTOLIN HFA) 108 (90 Base) MCG/ACT inhaler INHALE 1 TO 2 PUFFS BY MOUTH INTO THE LUNGS EVERY 6 HOURS AS NEEDED FOR WHEEZING OR SHORTNESS OF BREATH 05/20/22   Karie Schwalbe, MD  amLODipine (NORVASC) 5 MG tablet TAKE ONE TABLET BY MOUTH ONCE DAILY 07/17/21   Karie Schwalbe, MD  budesonide-formoterol (SYMBICORT) 80-4.5 MCG/ACT inhaler Inhale 2 puffs into the lungs in the morning and at bedtime. 02/21/22   Coralyn Helling, MD  furosemide (LASIX) 20 MG tablet TAKE ONE TABLET BY MOUTH ONCE DAILY 02/08/21   Tillman Abide I, MD  ketoconazole (NIZORAL) 2 % cream APPLY 1 APPLICATION TOPICALLY TO GROIN AREA 2 TIMES A DAY AS NEEDED FOR IRRITATION 11/19/21   Karie Schwalbe, MD  MYRBETRIQ 50 MG TB24 tablet TAKE ONE TABLET BY MOUTH ONCE DAILY 06/19/21   Karie Schwalbe, MD  sertraline (ZOLOFT) 50 MG tablet Take 1 tablet (50 mg total) by mouth daily. 05/20/22   Karie Schwalbe, MD  traMADol (ULTRAM)  50 MG tablet TAKE TWO TABLETS BY MOUTH TWICE DAILY AS NEEDED 05/20/22   Eulis Foster, FNP    Family History Family History  Problem Relation Age of Onset   Heart disease Father        died age 10- MI   Alcohol abuse Brother    Heart disease Brother    Heart disease Sister    Breast cancer Neg Hx     Social History Social History   Tobacco Use   Smoking status: Never   Smokeless tobacco: Never  Vaping Use   Vaping Use: Never used  Substance Use Topics   Alcohol use: No   Drug use: No     Allergies   Patient has no known allergies.   Review of Systems Review of Systems  Constitutional:  Negative for chills, diaphoresis, fatigue and fever.  HENT:  Negative for congestion, ear discharge, ear pain, postnasal drip, rhinorrhea and sore throat.   Eyes:  Negative for discharge.  Respiratory:  Positive for cough and wheezing. Negative for shortness of breath.   Hematological:  Negative for adenopathy.     Physical Exam Triage Vital Signs ED Triage Vitals  Enc Vitals Group     BP 06/03/22 1431 (!) 140/57     Pulse Rate 06/03/22 1431 85     Resp 06/03/22 1431 19     Temp 06/03/22 1431 98.1 F (36.7 C)     Temp Source 06/03/22 1431 Oral     SpO2 06/03/22 1431 96 %     Weight 06/03/22 1427 259 lb (117.5 kg)     Height 06/03/22 1427 5\' 5"  (1.651 m)     Head Circumference --      Peak Flow --      Pain Score 06/03/22 1427 0     Pain Loc --      Pain Edu? --      Excl. in GC? --    No data found.  Updated Vital Signs BP (!) 140/57 (BP Location: Right Arm)   Pulse 85   Temp 98.1 F (36.7 C) (Oral)   Resp 19   Ht 5\' 5"  (1.651 m)   Wt 259 lb (117.5 kg)   SpO2 96%   BMI 43.10 kg/m   Visual Acuity Right Eye Distance:   Left Eye Distance:   Bilateral Distance:    Right Eye Near:   Left Eye Near:    Bilateral Near:     Physical Exam Vitals and nursing note reviewed.  Constitutional:      General: She is not in acute distress.    Appearance: She is  obese. She is not toxic-appearing.  HENT:     Right Ear: Tympanic membrane, ear canal and external ear normal.     Left Ear: Tympanic membrane, ear canal  and external ear normal.     Nose: Nose normal.     Mouth/Throat:     Mouth: Mucous membranes are moist.  Eyes:     General: No scleral icterus.    Conjunctiva/sclera: Conjunctivae normal.  Cardiovascular:     Rate and Rhythm: Normal rate and regular rhythm.  Pulmonary:     Effort: Pulmonary effort is normal.     Breath sounds: Normal breath sounds. No wheezing, rhonchi or rales.  Musculoskeletal:     Cervical back: Neck supple.  Lymphadenopathy:     Cervical: No cervical adenopathy.  Skin:    General: Skin is warm and dry.     Findings: No rash.  Neurological:     Mental Status: She is alert and oriented to person, place, and time.     Gait: Gait normal.  Psychiatric:        Mood and Affect: Mood normal.        Behavior: Behavior normal.        Thought Content: Thought content normal.        Judgment: Judgment normal.      UC Treatments / Results  Labs (all labs ordered are listed, but only abnormal results are displayed) Labs Reviewed - No data to display  EKG   Radiology DG Chest 2 View  Result Date: 06/03/2022 CLINICAL DATA:  Cough EXAM: CHEST - 2 VIEW COMPARISON:  02/21/2022 FINDINGS: Cardiac size is within normal limits. There are no signs of pulmonary edema or focal pulmonary consolidation. Small transverse linear densities in the lateral aspect of left lower lung fields may suggest minimal scarring. There is no pleural effusion or pneumothorax. IMPRESSION: No active cardiopulmonary disease. Electronically Signed   By: Ernie Avena M.D.   On: 06/03/2022 14:46    Procedures Procedures (including critical care time)  Medications Ordered in UC Medications - No data to display  Initial Impression / Assessment and Plan / UC Course  I have reviewed the triage vital signs and the nursing  notes.  Pertinent  imaging results that were available during my care of the patient were reviewed by me and considered in my medical decision making (see chart for details).  Cough with one episode of slight hemoptysis, more likely tore some  blood vessels with the hard cough Bronchospasm  I reviewed her last pulmonologist notes to find her exact diagnosis I placed her on Prednisone and Tessalon as noted.     Final Clinical Impressions(s) / UC Diagnoses   Final diagnoses:  Acute cough     Discharge Instructions      You are having bronchospasm and the prednisone should help with that     ED Prescriptions     Medication Sig Dispense Auth. Provider   benzonatate (TESSALON) 200 MG capsule Take 1 capsule (200 mg total) by mouth 2 (two) times daily as needed for cough. 30 capsule Rodriguez-Southworth, Nettie Elm, PA-C   predniSONE (DELTASONE) 20 MG tablet Take 1 tablet (20 mg total) by mouth daily with breakfast. 5 tablet Rodriguez-Southworth, Nettie Elm, PA-C      PDMP not reviewed this encounter.   Garey Ham, PA-C 06/03/22 1529    Rodriguez-Southworth, Nettie Elm, PA-C 06/03/22 1529

## 2022-06-03 NOTE — ED Triage Notes (Signed)
Pt reports she has had a cough for a couple weeks. Last night noticed a tinge of blood in it but "I think it's because I've irritated my throat." Talked to pulmonologist office and told to get a CXR. SOB is baseline for pt.

## 2022-06-04 DIAGNOSIS — H25013 Cortical age-related cataract, bilateral: Secondary | ICD-10-CM | POA: Diagnosis not present

## 2022-06-12 DIAGNOSIS — I5032 Chronic diastolic (congestive) heart failure: Secondary | ICD-10-CM | POA: Diagnosis not present

## 2022-06-12 DIAGNOSIS — G4733 Obstructive sleep apnea (adult) (pediatric): Secondary | ICD-10-CM | POA: Diagnosis not present

## 2022-06-14 ENCOUNTER — Ambulatory Visit (INDEPENDENT_AMBULATORY_CARE_PROVIDER_SITE_OTHER): Payer: Medicare Other | Admitting: Primary Care

## 2022-06-14 ENCOUNTER — Encounter: Payer: Self-pay | Admitting: Primary Care

## 2022-06-14 ENCOUNTER — Ambulatory Visit (INDEPENDENT_AMBULATORY_CARE_PROVIDER_SITE_OTHER): Payer: Medicare Other | Admitting: Cardiology

## 2022-06-14 ENCOUNTER — Encounter: Payer: Self-pay | Admitting: Cardiology

## 2022-06-14 VITALS — BP 130/60 | HR 72 | Ht 63.0 in | Wt 268.6 lb

## 2022-06-14 DIAGNOSIS — R0609 Other forms of dyspnea: Secondary | ICD-10-CM | POA: Diagnosis not present

## 2022-06-14 DIAGNOSIS — G4730 Sleep apnea, unspecified: Secondary | ICD-10-CM | POA: Diagnosis not present

## 2022-06-14 DIAGNOSIS — J40 Bronchitis, not specified as acute or chronic: Secondary | ICD-10-CM | POA: Insufficient documentation

## 2022-06-14 DIAGNOSIS — I10 Essential (primary) hypertension: Secondary | ICD-10-CM | POA: Diagnosis not present

## 2022-06-14 DIAGNOSIS — D7218 Eosinophilia in diseases classified elsewhere: Secondary | ICD-10-CM | POA: Insufficient documentation

## 2022-06-14 DIAGNOSIS — J9611 Chronic respiratory failure with hypoxia: Secondary | ICD-10-CM | POA: Diagnosis not present

## 2022-06-14 MED ORDER — MONTELUKAST SODIUM 10 MG PO TABS: 10.0000 mg | ORAL_TABLET | Freq: Every day | ORAL | 11 refills | Status: AC

## 2022-06-14 MED ORDER — PREDNISONE 10 MG PO TABS: ORAL_TABLET | ORAL | 0 refills | Status: DC

## 2022-06-14 NOTE — Patient Instructions (Addendum)
Recommendations: Continue Symbicort two puffs morning and evening (use with spacer) Start Montelukast (Singulair) 10mg  at bedtime Prednisone taper as directed  Try delsym cough syrup (over the counter) twice a day as needed for cough  Make sure to wear oxygen 2L every night while sleeping   Follow-up: 3 months with Dr. or Craige Cotta NP

## 2022-06-14 NOTE — Assessment & Plan Note (Signed)
-   O2 96% RA with ambulation  - Continue to wear 2L supplemental oxygen at bedtime and with exertion to maintain O2 >88-90%

## 2022-06-14 NOTE — Progress Notes (Signed)
Cardiology Office Note:    Date:  06/14/2022   ID:  Autumn Price, DOB [birth year], MRN [id number]  PCP:  Karie Schwalbe, MD  [facility] Cardiologist:  Debbe Odea, MD  [facility] Electrophysiologist:  None   Referring MD: Karie Schwalbe, MD   Chief Complaint  Patient presents with   Follow-up    2 month follow up. Patient states that she has had a little bit of chest pains this week.  Meds reviewed with patient.    History of Present Illness:    Autumn Price is a 81 y.o. female with a hx of hypertension, obesity, OSA who presents for follow-up.    Being seen due to shortness of breath on exertion.  Sees pulmonary medicine due to respiratory failure, OSA.  Refer to vein clinic due to lymphedema.  Previous echo and Myoview were unrevealing.  BP well controlled on amlodipine.  She still endorses shortness of breath with minimal exertion, cannot tolerate any mask/CPAP mask for OSA treatment.  Her insurance declined covering nutritional services.   Prior notes Echo 09/2019 EF 60 to 65%, diastolic function normal Lexiscan Myoview 10/2020 no evidence for ischemia, minimal coronary artery calcification noted father had an MI in his 47s.  Previously had lower extremity edema which is improved with Lasix.  Patient previously saw pulmonary medicine, had PFTs done and were more consistent with obesity/obesity hypoventilation. No evidence of obstruction or restriction noted.    Past Medical History:  Diagnosis Date   Depression    Hypertension    Osteoarthritis    Urge incontinence    Venous insufficiency     Past Surgical History:  Procedure Laterality Date   APPENDECTOMY  1966   DILATION AND CURETTAGE OF UTERUS  01/27/14   benign   JOINT REPLACEMENT     right knee   TOTAL KNEE ARTHROPLASTY Right 03/20/2018   Procedure: RIGHT TOTAL KNEE ARTHROPLASTY;  Surgeon: Eugenia Mcalpine, MD;  Location: [facility];  Service: Orthopedics;  Laterality: Right;   TOTAL KNEE  ARTHROPLASTY Left 09/11/2018   Procedure: LEFT TOTAL KNEE ARTHROPLASTY;  Surgeon: Eugenia Mcalpine, MD;  Location: [facility];  Service: Orthopedics;  Laterality: Left;   TUBAL LIGATION  1966    Current Medications: Current Meds  Medication Sig   acetaminophen (TYLENOL) 500 MG tablet Take 500 mg by mouth every 6 (six) hours as needed for mild pain or moderate pain.   albuterol (VENTOLIN HFA) 108 (90 Base) MCG/ACT inhaler INHALE 1 TO 2 PUFFS BY MOUTH INTO THE LUNGS EVERY 6 HOURS AS NEEDED FOR WHEEZING OR SHORTNESS OF BREATH   amLODipine (NORVASC) 5 MG tablet TAKE ONE TABLET BY MOUTH ONCE DAILY   benzonatate (TESSALON) 200 MG capsule Take 1 capsule (200 mg total) by mouth 2 (two) times daily as needed for cough.   budesonide-formoterol (SYMBICORT) 80-4.5 MCG/ACT inhaler Inhale 2 puffs into the lungs in the morning and at bedtime.   furosemide (LASIX) 20 MG tablet TAKE ONE TABLET BY MOUTH ONCE DAILY   ketoconazole (NIZORAL) 2 % cream APPLY 1 APPLICATION TOPICALLY TO GROIN AREA 2 TIMES A DAY AS NEEDED FOR IRRITATION   MYRBETRIQ 50 MG TB24 tablet TAKE ONE TABLET BY MOUTH ONCE DAILY   sertraline (ZOLOFT) 50 MG tablet Take 1 tablet (50 mg total) by mouth daily.   traMADol (ULTRAM) 50 MG tablet TAKE TWO TABLETS BY MOUTH TWICE DAILY AS NEEDED     Allergies:   Patient has no known allergies.   Social History  Socioeconomic History   Marital status: Divorced    Spouse name: Not on file   Number of children: 3   Years of education: Not on file   Highest education level: Not on file  Occupational History   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    Comment: [employer]  Tobacco Use   Smoking status: Never   Smokeless tobacco: Never  Vaping Use   Vaping Use: Never used  Substance and Sexual Activity   Alcohol use: No   Drug use: No   Sexual activity: Not Currently  Other Topics Concern   Not on file  Social History Narrative   Has living will   Designated son Ronny Flurry and other  children to make health care decisions for her   Would accept CPR/resuscitation   Hasn't considered tube feedings--would leave to son   Social Determinants of Health   Financial Resource Strain: Low Risk  (08/18/2021)   Overall Financial Resource Strain (CARDIA)    Difficulty of Paying Living Expenses: Not hard at all  Food Insecurity: No Food Insecurity (08/18/2021)   Hunger Vital Sign    Worried About Running Out of Food in the Last Year: Never true    Ran Out of Food in the Last Year: Never true  Transportation Needs: No Transportation Needs (08/18/2021)   PRAPARE - Administrator, Civil Service (Medical): No    Lack of Transportation (Non-Medical): No  Physical Activity: Insufficiently Active (08/18/2021)   Exercise Vital Sign    Days of Exercise per Week: 4 days    Minutes of Exercise per Session: 10 min  Stress: No Stress Concern Present (08/18/2021)   [employer] of Occupational Health - Occupational Stress Questionnaire    Feeling of Stress : Only a little  Social Connections: Moderately Isolated (08/18/2021)   Social Connection and Isolation Panel [NHANES]    Frequency of Communication with Friends and Family: More than three times a week    Frequency of Social Gatherings with Friends and Family: Never    Attends Religious Services: 1 to 4 times per year    Active Member of [employer] or Organizations: No    Attends Engineer, structural: Never    Marital Status: Divorced     Family History: The patient's family history includes Alcohol abuse in her brother; Heart disease in her brother, father, and sister. There is no history of Breast cancer.  ROS:   Please see the history of present illness.     All other systems reviewed and are negative.  EKGs/Labs/Other Studies Reviewed:    The following studies were reviewed today:   EKG:  EKG is  ordered today.  The ekg ordered today demonstrates normal sinus rhythm, with sinus arrhythmia  Recent  Labs: 02/21/2022: ALT 12; BUN 14; Creatinine, Ser 0.77; Hemoglobin 14.1; Platelets 166; Potassium 4.2; Sodium 141  Recent Lipid Panel    Component Value Date/Time   CHOL 185 08/09/2016 1037   TRIG 116.0 08/09/2016 1037   HDL 59.10 08/09/2016 1037   CHOLHDL 3 08/09/2016 1037   VLDL 23.2 08/09/2016 1037   LDLCALC 102 (H) 08/09/2016 1037     Risk Assessment/Calculations:      Physical Exam:    VS:  BP 130/60 (BP Location: Left Arm, Patient Position: Sitting, Cuff Size: Large)   Pulse 72   Ht 5\' 3"  (1.6 m)   Wt 268 lb 9.6 oz (121.8 kg)   SpO2 93%   BMI 47.58 kg/m  Wt Readings from Last 3 Encounters:  06/14/22 268 lb (121.6 kg)  06/14/22 268 lb 9.6 oz (121.8 kg)  06/03/22 259 lb (117.5 kg)     GEN:  Well nourished, well developed in no acute distress HEENT: Normal NECK: No JVD; No carotid bruits LYMPHATICS: No lymphadenopathy CARDIAC: RRR, no murmurs, rubs, gallops RESPIRATORY:  Clear to auscultation without rales, wheezing or rhonchi  ABDOMEN: Soft, non-tender, distended MUSCULOSKELETAL: Leg edema/lymphedema noted SKIN: Warm and dry NEUROLOGIC:  Alert and oriented x 3 PSYCHIATRIC:  Normal affect   ASSESSMENT:    1. Dyspnea on exertion   2. Primary hypertension   3. Morbid obesity (HCC)   4. Sleep apnea, unspecified type     PLAN:    In order of problems listed above:  dyspnea on exertion.  Etiology likely morbid obesity, respiratory failure, sleep apnea.  Cardiac work-up with echo and Myoview unrevealing.  Echo 09/2019 showed normal systolic and diastolic function, EF 60 to 65%.  Lexiscan Myoview 10/2020 low risk study, no evidence for ischemia .  OSA treatment will be beneficial.  Will refer to OSA clinic per Dr. Mayford Knife to consider other mask options or inspire device. Hypertension, BP controlled.  Continue amlodipine 5 mg daily. Obesity, low-calorie diet, weight loss advised.  Does not qualify for Ozempic due to not being diabetic, may qualify for [facility] but  [facility] Lutes is currently out of stock.  Follow-up in 4-5 months.   Medication Adjustments/Labs and Tests Ordered: Current medicines are reviewed at length with the patient today.  Concerns regarding medicines are outlined above.  Orders Placed This Encounter  Procedures   Ambulatory referral to Cardiology   EKG 12-Lead   No orders of the defined types were placed in this encounter.   Patient Instructions  Medication Instructions:   Your physician recommends that you continue on your current medications as directed. Please refer to the Current Medication list given to you today.  *If you need a refill on your cardiac medications before your next appointment, please call your pharmacy*    Follow-Up: At [facility], you and your health needs are our priority.  As part of our continuing mission to provide you with exceptional heart care, we have created designated Provider Care Teams.  These Care Teams include your primary Cardiologist (physician) and Advanced Practice Providers (APPs -  Physician Assistants and Nurse Practitioners) who all work together to provide you with the care you need, when you need it.  We recommend signing up for the patient portal called "MyChart".  Sign up information is provided on this After Visit Summary.  MyChart is used to connect with patients for Virtual Visits (Telemedicine).  Patients are able to view lab/test results, encounter notes, upcoming appointments, etc.  Non-urgent messages can be sent to your provider as well.   To learn more about what you can do with MyChart, go to [email or web address].    Your next appointment:   5 month(s)  The format for your next appointment:   In Person  Provider:    ONLY WITH Debbe Odea, MD    Other Instructions  [facility]  Address: [address] [place], [place] [address] Hours:  Open ? Closes 5?PM Phone: [phone]   Important Information About Sugar          Signed, Debbe Odea, MD  06/14/2022 12:21 PM    [facility]

## 2022-06-14 NOTE — Patient Instructions (Signed)
Medication Instructions:   Your physician recommends that you continue on your current medications as directed. Please refer to the Current Medication list given to you today.  *If you need a refill on your cardiac medications before your next appointment, please call your pharmacy*    Follow-Up: At [facility], you and your health needs are our priority.  As part of our continuing mission to provide you with exceptional heart care, we have created designated Provider Care Teams.  These Care Teams include your primary Cardiologist (physician) and Advanced Practice Providers (APPs -  Physician Assistants and Nurse Practitioners) who all work together to provide you with the care you need, when you need it.  We recommend signing up for the patient portal called "MyChart".  Sign up information is provided on this After Visit Summary.  MyChart is used to connect with patients for Virtual Visits (Telemedicine).  Patients are able to view lab/test results, encounter notes, upcoming appointments, etc.  Non-urgent messages can be sent to your provider as well.   To learn more about what you can do with MyChart, go to [email or web address].    Your next appointment:   5 month(s)  The format for your next appointment:   In Person  Provider:    ONLY WITH Debbe Odea, MD    Other Instructions  [facility]  Address: [address] [place], [place] [address] Hours:  Open ? Closes 5?PM Phone: [phone]   Important Information About Sugar

## 2022-06-14 NOTE — Assessment & Plan Note (Addendum)
-   Chronic cough with clear sputum. Associated wheezing and dyspnea with exertion. Lungs were clear on exam today. CXR in June without acute process, linear scarring left lower lobe. Continue Symbicort two puffs twice daily with spacer. RX prednisone taper and start montelukast 10mg  at bedtime. Recommend patient take delsym cough syrup 58ml q12 hours as needed. FU in 3 months or sooner if needed.

## 2022-06-14 NOTE — Progress Notes (Signed)
@Patient  ID: , female    DOB: June 03, 1941, 81 y.o.   MRN: [id number]  Chief Complaint  Patient presents with   Follow-up    SOB with exertion, prod cough with white sputum and wheezing    Referring provider: [id number], MD  HPI: 81 year old female, never smoked.  Past medical history significant for eosinophilic bronchitis, chronic respiratory failure with hypoxia.  Current patient of Dr. 70, last seen on 02/21/2022.   06/14/2022 Patient presents today for 6 to 8-week follow-up. She is intolerant to DPI's, started on Symbicort 80 during last visit.  Maintained on 2 L of oxygen at night and with exertion. She has a productive cough with clear sputum. Associated wheezing. She is using Symbicort, need spacer. She went to UC on 06/03/22 d/t cough. She had a CXR on 06/03/22 which showed no active cardiopulmonary disease, small linear densities in the lateral aspect of left lower lung field suggest minimal scarring. She had a CTA in 2020 that showed bilateral linear and subsegmental atelectasis.   She continues to have cough. She was given tesslon perles which had not helped. She did notice improvement while on 5 day course of steroids. She has post nasal drip first thing in the morning. Cardiology referred her to sleep specialist on [address], concern she may have sleep apnea and advised weight loss.   No Known Allergies  Immunization History  Administered Date(s) Administered   Fluad Quad(high Dose 65+) 10/11/2020   Influenza Inj Mdck Quad Pf 10/30/2018, 09/25/2019   Influenza, Seasonal, Injecte, Preservative Fre 09/30/2016   Influenza,inj,Quad PF,6+ Mos 09/12/2017   Influenza-Unspecified 09/12/2021   Pneumococcal Conjugate-13 06/10/2014   Pneumococcal Polysaccharide-23 01/16/2009, 08/12/2017   Td 08/21/2010   Zoster Recombinat (Shingrix) 09/30/2019, 01/11/2020   Zoster, Live 09/18/2016    Past Medical History:  Diagnosis Date   Depression    Hypertension     Osteoarthritis    Urge incontinence    Venous insufficiency     Tobacco History: Social History   Tobacco Use  Smoking Status Never  Smokeless Tobacco Never   Counseling given: Not Answered   Outpatient Medications Prior to Visit  Medication Sig Dispense Refill   acetaminophen (TYLENOL) 500 MG tablet Take 500 mg by mouth every 6 (six) hours as needed for mild pain or moderate pain.     albuterol (VENTOLIN HFA) 108 (90 Base) MCG/ACT inhaler INHALE 1 TO 2 PUFFS BY MOUTH INTO THE LUNGS EVERY 6 HOURS AS NEEDED FOR WHEEZING OR SHORTNESS OF BREATH 8.5 g 0   amLODipine (NORVASC) 5 MG tablet TAKE ONE TABLET BY MOUTH ONCE DAILY 90 tablet 3   benzonatate (TESSALON) 200 MG capsule Take 1 capsule (200 mg total) by mouth 2 (two) times daily as needed for cough. 30 capsule 0   budesonide-formoterol (SYMBICORT) 80-4.5 MCG/ACT inhaler Inhale 2 puffs into the lungs in the morning and at bedtime. 1 each 12   furosemide (LASIX) 20 MG tablet TAKE ONE TABLET BY MOUTH ONCE DAILY 30 tablet 11   ketoconazole (NIZORAL) 2 % cream APPLY 1 APPLICATION TOPICALLY TO GROIN AREA 2 TIMES A DAY AS NEEDED FOR IRRITATION 30 g 3   MYRBETRIQ 50 MG TB24 tablet TAKE ONE TABLET BY MOUTH ONCE DAILY 90 tablet 3   sertraline (ZOLOFT) 50 MG tablet Take 1 tablet (50 mg total) by mouth daily. 90 tablet 0   traMADol (ULTRAM) 50 MG tablet TAKE TWO TABLETS BY MOUTH TWICE DAILY AS NEEDED 120 tablet 0  predniSONE (DELTASONE) 20 MG tablet Take 1 tablet (20 mg total) by mouth daily with breakfast. 5 tablet 0   No facility-administered medications prior to visit.    Review of Systems  Review of Systems  Constitutional: Negative.   HENT: Negative.    Respiratory:  Positive for cough, shortness of breath and wheezing.   Cardiovascular: Negative.     Physical Exam  BP 130/60 (BP Location: Left Arm, Cuff Size: Large)   Pulse 72   Temp 97.9 F (36.6 C) (Temporal)   Ht 5\' 3"  (1.6 m)   Wt 268 lb (121.6 kg)   SpO2 96%   BMI  47.47 kg/m  Physical Exam Constitutional:      Appearance: Normal appearance. She is obese. She is not ill-appearing.  HENT:     Head: Normocephalic and atraumatic.     Mouth/Throat:     Mouth: Mucous membranes are moist.     Pharynx: Oropharynx is clear.  Cardiovascular:     Rate and Rhythm: Normal rate and regular rhythm.  Pulmonary:     Effort: Pulmonary effort is normal.     Breath sounds: Normal breath sounds. No wheezing, rhonchi or rales.  Musculoskeletal:        General: Normal range of motion.  Skin:    General: Skin is warm and dry.  Neurological:     General: No focal deficit present.     Mental Status: She is alert and oriented to person, place, and time. Mental status is at baseline.  Psychiatric:        Mood and Affect: Mood normal.        Behavior: Behavior normal.        Thought Content: Thought content normal.        Judgment: Judgment normal.      Lab Results:  CBC    Component Value Date/Time   WBC 5.8 02/21/2022 1157   RBC 5.10 02/21/2022 1157   HGB 14.1 02/21/2022 1157   HGB 14.3 01/20/2014 1303   HCT 45.5 02/21/2022 1157   HCT 42.9 01/20/2014 1303   PLT 166 02/21/2022 1157   PLT 160 01/20/2014 1303   MCV 89.2 02/21/2022 1157   MCV 89 01/20/2014 1303   MCH 27.6 02/21/2022 1157   MCHC 31.0 02/21/2022 1157   RDW 13.1 02/21/2022 1157   RDW 14.3 01/20/2014 1303   LYMPHSABS 1.1 02/21/2022 1157   MONOABS 0.5 02/21/2022 1157   EOSABS 0.1 02/21/2022 1157   BASOSABS 0.1 02/21/2022 1157    BMET    Component Value Date/Time   NA 141 02/21/2022 1157   NA 142 01/20/2014 1303   K 4.2 02/21/2022 1157   K 4.4 01/20/2014 1303   CL 106 02/21/2022 1157   CL 106 01/20/2014 1303   CO2 28 02/21/2022 1157   CO2 33 (H) 01/20/2014 1303   GLUCOSE 125 (H) 02/21/2022 1157   GLUCOSE 129 (H) 01/20/2014 1303   BUN 14 02/21/2022 1157   BUN 13 01/20/2014 1303   CREATININE 0.77 02/21/2022 1157   CREATININE 0.72 01/20/2014 1303   CALCIUM 9.5 02/21/2022 1157    CALCIUM 9.6 01/20/2014 1303   GFRNONAA >60 02/21/2022 1157   GFRNONAA >60 01/20/2014 1303   GFRAA >60 10/08/2019 1844   GFRAA >60 01/20/2014 1303    BNP    Component Value Date/Time   BNP 215.0 (H) 10/08/2019 1844    ProBNP    Component Value Date/Time   PROBNP 158.0 (H) 05/11/2020 1700  Imaging: DG Chest 2 View  Result Date: 06/03/2022 CLINICAL DATA:  Cough EXAM: CHEST - 2 VIEW COMPARISON:  02/21/2022 FINDINGS: Cardiac size is within normal limits. There are no signs of pulmonary edema or focal pulmonary consolidation. Small transverse linear densities in the lateral aspect of left lower lung fields may suggest minimal scarring. There is no pleural effusion or pneumothorax. IMPRESSION: No active cardiopulmonary disease. Electronically Signed   By: Elmer Picker M.D.   On: 06/03/2022 14:46     Assessment & Plan:   Eosinophilic bronchitis - Chronic cough with clear sputum. Associated wheezing and dyspnea with exertion. Lungs were clear on exam today. CXR in June without acute process, linear scarring left lower lobe. Continue Symbicort 5mcg two puffs twice daily with spacer. RX prednisone taper and start montelukast 10mg  at bedtime. Recommend patient take delsym cough syrup 41ml q12 hours as needed. FU in 3 months or sooner if needed.   Chronic hypoxemic respiratory failure (HCC) - O2 96% RA with ambulation  - Continue to wear 2L supplemental oxygen at bedtime and with exertion to maintain O2 >88-90%    Martyn Ehrich, NP 06/14/2022

## 2022-06-15 ENCOUNTER — Other Ambulatory Visit: Payer: Self-pay | Admitting: Family

## 2022-06-15 ENCOUNTER — Other Ambulatory Visit: Payer: Self-pay | Admitting: Internal Medicine

## 2022-06-16 NOTE — Progress Notes (Signed)
Reviewed and agree with assessment/plan.   Anthone Prieur, MD [facility] 06/16/2022, 10:57 AM Pager:  [phone]  

## 2022-06-21 ENCOUNTER — Other Ambulatory Visit: Payer: Self-pay | Admitting: Internal Medicine

## 2022-06-21 MED ORDER — TRAMADOL HCL 50 MG PO TABS: 50.0000 mg | ORAL_TABLET | Freq: Two times a day (BID) | ORAL | 0 refills | Status: DC | PRN

## 2022-06-21 NOTE — Telephone Encounter (Signed)
Patient has enough tramadol to last for a few more days, but is following up on refill request, please advise.

## 2022-06-21 NOTE — Telephone Encounter (Signed)
I have taken care of this

## 2022-06-21 NOTE — Addendum Note (Signed)
Addended by: Tillman Abide I on: 06/21/2022 01:18 PM   Modules accepted: Orders

## 2022-06-21 NOTE — Telephone Encounter (Signed)
Dr Alphonsus Sias is aware of this  for patient.

## 2022-07-03 ENCOUNTER — Ambulatory Visit (INDEPENDENT_AMBULATORY_CARE_PROVIDER_SITE_OTHER): Payer: Medicare Other | Admitting: Internal Medicine

## 2022-07-03 ENCOUNTER — Encounter: Payer: Self-pay | Admitting: Internal Medicine

## 2022-07-03 DIAGNOSIS — R0609 Other forms of dyspnea: Secondary | ICD-10-CM

## 2022-07-03 DIAGNOSIS — I5032 Chronic diastolic (congestive) heart failure: Secondary | ICD-10-CM

## 2022-07-03 DIAGNOSIS — J9611 Chronic respiratory failure with hypoxia: Secondary | ICD-10-CM

## 2022-07-03 NOTE — Assessment & Plan Note (Signed)
Information on DASH eating given Discussed her need to work on stamina---short frequent aerobic/resistance exercises

## 2022-07-03 NOTE — Assessment & Plan Note (Signed)
Seems to be compensated---may be a small part of her DOE Furosemide 20mg  daily unless she is going out

## 2022-07-03 NOTE — Assessment & Plan Note (Signed)
Down to using the oxygen only at bedtime

## 2022-07-03 NOTE — Progress Notes (Signed)
Subjective:    Patient ID: Autumn Price, female    DOB: 11-02-1941, 81 y.o.   MRN: [id number]  HPI Here due to ongoing problems "giving out" just walking in house  Can take bath and get dressed Then going to kitchen will give out May have to sit down after doing dishes for a while  Was getting cough after symbicort so she stopped it No worsening in her breathing--cough is better Uses the oxygen just at night  She noted that things got worse after her knee surgeries--2019 That is when cough started  Current Outpatient Medications on File Prior to Visit  Medication Sig Dispense Refill   acetaminophen (TYLENOL) 500 MG tablet Take 500 mg by mouth every 6 (six) hours as needed for mild pain or moderate pain.     albuterol (VENTOLIN HFA) 108 (90 Base) MCG/ACT inhaler INHALE 1 TO 2 PUFFS BY MOUTH INTO THE LUNGS EVERY 6 HOURS AS NEEDED FOR WHEEZING OR SHORTNESS OF BREATH 8.5 g 0   amLODipine (NORVASC) 5 MG tablet TAKE 1 TABLET ONCE DAILY 30 tablet 0   furosemide (LASIX) 20 MG tablet TAKE ONE TABLET BY MOUTH ONCE DAILY 30 tablet 11   ketoconazole (NIZORAL) 2 % cream APPLY 1 APPLICATION TOPICALLY TO GROIN AREA 2 TIMES A DAY AS NEEDED FOR IRRITATION 30 g 3   montelukast (SINGULAIR) 10 MG tablet Take 1 tablet (10 mg total) by mouth at bedtime. 30 tablet 11   MYRBETRIQ 50 MG TB24 tablet TAKE ONE TABLET BY MOUTH ONCE DAILY 90 tablet 3   predniSONE (DELTASONE) 10 MG tablet 4 tabs for 2 days, then 3 tabs for 2 days, 2 tabs for 2 days, then 1 tab for 2 days, then stop 20 tablet 0   sertraline (ZOLOFT) 50 MG tablet Take 1 tablet (50 mg total) by mouth daily. 90 tablet 0   traMADol (ULTRAM) 50 MG tablet Take 1-2 tablets (50-100 mg total) by mouth every 12 (twelve) hours as needed. 120 tablet 0   No current facility-administered medications on file prior to visit.    No Known Allergies  Past Medical History:  Diagnosis Date   Depression    Hypertension    Osteoarthritis    Urge incontinence     Venous insufficiency     Past Surgical History:  Procedure Laterality Date   APPENDECTOMY  1966   DILATION AND CURETTAGE OF UTERUS  01/27/14   benign   JOINT REPLACEMENT     right knee   TOTAL KNEE ARTHROPLASTY Right 03/20/2018   Procedure: RIGHT TOTAL KNEE ARTHROPLASTY;  Surgeon: Eugenia Mcalpine, MD;  Location: [facility];  Service: Orthopedics;  Laterality: Right;   TOTAL KNEE ARTHROPLASTY Left 09/11/2018   Procedure: LEFT TOTAL KNEE ARTHROPLASTY;  Surgeon: Eugenia Mcalpine, MD;  Location: [facility];  Service: Orthopedics;  Laterality: Left;   TUBAL LIGATION  1966    Family History  Problem Relation Age of Onset   Heart disease Father        died age 57- MI   Alcohol abuse Brother    Heart disease Brother    Heart disease Sister    Breast cancer Neg Hx     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    Comment:  [employer]  Tobacco Use   Smoking status: Never   Smokeless tobacco: Never  Vaping Use   Vaping Use: Never used  Substance and Sexual Activity   Alcohol use: No   Drug use: No   Sexual activity: Not Currently  Other Topics Concern   Not on file  Social History Narrative   Has living will   Designated son Ronny Flurry and other children to make health care decisions for her   Would accept CPR/resuscitation   Hasn't considered tube feedings--would leave to son   Social Determinants of Health   Financial Resource Strain: Low Risk  (08/18/2021)   Overall Financial Resource Strain (CARDIA)    Difficulty of Paying Living Expenses: Not hard at all  Food Insecurity: No Food Insecurity (08/18/2021)   Hunger Vital Sign    Worried About Running Out of Food in the Last Year: Never true    Ran Out of Food in the Last Year: Never true  Transportation  Needs: No Transportation Needs (08/18/2021)   PRAPARE - Administrator, Civil Service (Medical): No    Lack of Transportation (Non-Medical): No  Physical Activity: Insufficiently Active (08/18/2021)   Exercise Vital Sign    Days of Exercise per Week: 4 days    Minutes of Exercise per Session: 10 min  Stress: No Stress Concern Present (08/18/2021)   [employer] of Occupational Health - Occupational Stress Questionnaire    Feeling of Stress : Only a little  Social Connections: Moderately Isolated (08/18/2021)   Social Connection and Isolation Panel [NHANES]    Frequency of Communication with Friends and Family: More than three times a week    Frequency of Social Gatherings with Friends and Family: Never    Attends Religious Services: 1 to 4 times per year    Active Member of [employer] or Organizations: No    Attends Banker Meetings: Never    Marital Status: Divorced  Catering manager Violence: Not At Risk (08/18/2021)   Humiliation, Afraid, Rape, and Kick questionnaire    Fear of Current or Ex-Partner: No    Emotionally Abused: No    Physically Abused: No    Sexually Abused: No   Review of Systems Doesn't sleep well--may not initiate till 2AM even when in bed at 11PM. Awakens 6AM--still tired Is set to see Dr Mayford Knife to evaluate for OSA Chronic leg swelling---no pain (other than occ in pain) Tries to be careful about eating BMI 47    Objective:   Physical Exam Constitutional:      Appearance: Normal appearance.  Neurological:     Mental Status: She is alert.  Psychiatric:        Mood and Affect: Mood normal.        Behavior: Behavior normal.            Assessment & Plan:

## 2022-07-03 NOTE — Patient Instructions (Signed)

## 2022-07-03 NOTE — Assessment & Plan Note (Signed)
No worse off the symbicort Getting evaluated for OSA soon Discussed exercise

## 2022-07-12 DIAGNOSIS — G4733 Obstructive sleep apnea (adult) (pediatric): Secondary | ICD-10-CM | POA: Diagnosis not present

## 2022-07-12 DIAGNOSIS — I5032 Chronic diastolic (congestive) heart failure: Secondary | ICD-10-CM | POA: Diagnosis not present

## 2022-07-16 ENCOUNTER — Other Ambulatory Visit: Payer: Self-pay | Admitting: Internal Medicine

## 2022-07-16 NOTE — Telephone Encounter (Signed)
Last filled 06-21-22 #120 Last OV 07-03-22 Next OV 01-07-23 [facility]

## 2022-07-31 ENCOUNTER — Other Ambulatory Visit: Payer: Self-pay | Admitting: *Deleted

## 2022-07-31 NOTE — Patient Outreach (Signed)
  Care Coordination   07/31/2022 Name: Autumn Price MRN: [id number] DOB: February 26, 1941   Care Coordination Outreach Attempts:  An unsuccessful telephone outreach was attempted today to offer the patient information about available care coordination services as a benefit of their health plan.   Follow Up Plan:  Additional outreach attempts will be made to offer the patient care coordination information and services.   Encounter Outcome:  No Answer  Care Coordination Interventions Activated:  Yes   Care Coordination Interventions:  No, not indicated    SIG Gean Maidens BSN RN [facility] [phone]

## 2022-08-07 ENCOUNTER — Ambulatory Visit (INDEPENDENT_AMBULATORY_CARE_PROVIDER_SITE_OTHER): Payer: Medicare Other

## 2022-08-07 ENCOUNTER — Encounter: Payer: Self-pay | Admitting: Cardiology

## 2022-08-07 ENCOUNTER — Encounter: Payer: Self-pay | Admitting: *Deleted

## 2022-08-07 ENCOUNTER — Ambulatory Visit (INDEPENDENT_AMBULATORY_CARE_PROVIDER_SITE_OTHER): Payer: Medicare Other | Admitting: Cardiology

## 2022-08-07 VITALS — BP 138/68 | HR 81 | Ht 65.0 in | Wt 274.0 lb

## 2022-08-07 DIAGNOSIS — G4733 Obstructive sleep apnea (adult) (pediatric): Secondary | ICD-10-CM

## 2022-08-07 DIAGNOSIS — I10 Essential (primary) hypertension: Secondary | ICD-10-CM | POA: Diagnosis not present

## 2022-08-07 DIAGNOSIS — I471 Supraventricular tachycardia: Secondary | ICD-10-CM | POA: Diagnosis not present

## 2022-08-07 MED ORDER — METOPROLOL TARTRATE 25 MG PO TABS: 25.0000 mg | ORAL_TABLET | Freq: Two times a day (BID) | ORAL | 3 refills | Status: DC

## 2022-08-07 NOTE — Progress Notes (Signed)
Sleep Medicine CONSULT Note    Date:  08/07/2022   ID:  Autumn Price, DOB 12-12-1941, MRN [id number]  PCP:  Karie Schwalbe, MD  Cardiologist: Phillips Hay, MD  Chief Complaint  Patient presents with   New Patient (Initial Visit)    OSA    History of Present Illness:  Autumn Price is a 81 y.o. female who is being seen today for the evaluation of obstructive sleep apnea at the request of Debbe Odea, MD.  This is a 81 year old female with a history of obstructive sleep apnea, lymphedema and hypertension who was recently seen in cardiology clinic.  She had previously seen pulmonary medicine but has not seen them recently.  She had a sleep study back in 10/09/2020 showing moderate obstructive sleep apnea with an AHI of 15.5/h.  CPAP therapy was recommended but she refused as she cannot have anything on her face.  She cannot even wear O2.    Past Medical History:  Diagnosis Date   Depression    Hypertension    Osteoarthritis    Urge incontinence    Venous insufficiency     Past Surgical History:  Procedure Laterality Date   APPENDECTOMY  1966   DILATION AND CURETTAGE OF UTERUS  01/27/14   benign   JOINT REPLACEMENT     right knee   TOTAL KNEE ARTHROPLASTY Right 03/20/2018   Procedure: RIGHT TOTAL KNEE ARTHROPLASTY;  Surgeon: Eugenia Mcalpine, MD;  Location: [facility];  Service: Orthopedics;  Laterality: Right;   TOTAL KNEE ARTHROPLASTY Left 09/11/2018   Procedure: LEFT TOTAL KNEE ARTHROPLASTY;  Surgeon: Eugenia Mcalpine, MD;  Location: [facility];  Service: Orthopedics;  Laterality: Left;   TUBAL LIGATION  1966    Current Medications: Current Meds  Medication Sig   acetaminophen (TYLENOL) 500 MG tablet Take 500 mg by mouth every 6 (six) hours as needed for mild pain or moderate pain.   albuterol (VENTOLIN HFA) 108 (90 Base) MCG/ACT inhaler INHALE 1 TO 2 PUFFS BY MOUTH INTO THE LUNGS EVERY 6 HOURS AS NEEDED FOR WHEEZING OR SHORTNESS OF BREATH   amLODipine (NORVASC) 5  MG tablet TAKE 1 TABLET ONCE DAILY   furosemide (LASIX) 20 MG tablet TAKE ONE TABLET BY MOUTH ONCE DAILY   ketoconazole (NIZORAL) 2 % cream APPLY 1 APPLICATION TOPICALLY TO GROIN AREA 2 TIMES A DAY AS NEEDED FOR IRRITATION   mirabegron ER (MYRBETRIQ) 50 MG TB24 tablet TAKE 1 TABLET ONCE DAILY   montelukast (SINGULAIR) 10 MG tablet Take 1 tablet (10 mg total) by mouth at bedtime.   sertraline (ZOLOFT) 50 MG tablet Take 1 tablet (50 mg total) by mouth daily.   traMADol (ULTRAM) 50 MG tablet Take 1-2 tablets (50-100 mg total) by mouth every 12 (twelve) hours as needed.    Allergies:   Patient has no known allergies.   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    Comment: [employer] finishing  Tobacco Use   Smoking status: Never   Smokeless tobacco: Never  Vaping Use   Vaping Use: Never used  Substance and Sexual Activity   Alcohol use: No   Drug use: No   Sexual activity: Not Currently  Other Topics Concern   Not on file  Social History Narrative   Has living will   Designated son Ronny Flurry and other children to make health care decisions for her   Would accept CPR/resuscitation   Hasn't considered tube feedings--would leave to son   Social Determinants of Health   Financial Resource Strain: Low Risk  (08/18/2021)   Overall Financial Resource Strain (CARDIA)    Difficulty of Paying Living Expenses: Not hard at all  Food Insecurity: No Food Insecurity (08/18/2021)   Hunger Vital Sign    Worried About Running Out of Food in the Last Year: Never true    Ran Out of Food in the Last Year: Never true  Transportation Needs: No Transportation Needs (08/18/2021)   PRAPARE - Administrator, Civil Service (Medical): No    Lack of Transportation (Non-Medical): No   Physical Activity: Insufficiently Active (08/18/2021)   Exercise Vital Sign    Days of Exercise per Week: 4 days    Minutes of Exercise per Session: 10 min  Stress: No Stress Concern Present (08/18/2021)   [employer] of Occupational Health - Occupational Stress Questionnaire    Feeling of Stress : Only a little  Social Connections: Moderately Isolated (08/18/2021)   Social Connection and Isolation Panel [NHANES]    Frequency of Communication with Friends and Family: More than three times a week    Frequency of Social Gatherings with Friends and Family: Never    Attends Religious Services: 1 to 4 times per year    Active Member of [employer] or Organizations: No    Attends Engineer, structural: Never    Marital Status: Divorced     Family History:  The patient's family history includes Alcohol abuse in her brother; Heart disease in her brother, father, and sister.   ROS:   Please see the history of present illness.    ROS All other systems reviewed and are negative.     10/09/2020    3:14 PM  PAD Screen  Previous PAD dx? No  Previous surgical procedure? Yes  Pain with walking? No  Feet/toe relief with dangling? No  Painful, non-healing ulcers? No  Extremities discolored? No       PHYSICAL EXAM:   VS:  BP 138/68   Pulse 81   Ht 5\' 5"  (1.651 m)   Wt 274 lb (124.3 kg)   SpO2 96%   BMI 45.60 kg/m    GEN: Well nourished, well developed, in no acute distress  HEENT: normal  Neck: no JVD, carotid bruits, or masses Cardiac: RRR; no murmurs, rubs, or gallops,no edema.  Intact distal pulses bilaterally.   Intermittent fast heart rhythm Respiratory:  clear to auscultation bilaterally, normal work of breathing GI: soft, nontender, nondistended, + BS MS: no deformity or atrophy  Skin: warm and dry, no rash Neuro:  Alert and Oriented x 3, Strength and sensation are intact Psych: euthymic mood, full affect  Wt Readings from Last 3 Encounters:  08/07/22 274 lb (124.3  kg)  07/03/22 272 lb (123.4 kg)  06/14/22 268 lb (121.6 kg)      Studies/Labs Reviewed:   Office visit note from cardiology  Recent Labs: 02/21/2022: ALT 12; BUN 14; Creatinine, Ser 0.77; Hemoglobin 14.1; Platelets 166; Potassium 4.2; Sodium 141    CHA2DS2-VASc Score =     This indicates a  % annual risk of stroke. The patient's score is based upon:      Additional studies/ records that were reviewed today include:  Home sleep study from  2021  EKG was performed in the office today and shows normal sinus rhythm with episodic runs of SVT  ASSESSMENT:    1. OSA (obstructive sleep apnea)   2. Primary hypertension   3. SVT (supraventricular tachycardia) (HCC)      PLAN:  In order of problems listed above:  OSA  -She had been followed by pulmonary medicine but has not been using her device because she does not tolerate the Pap mask. -Sleep study in 2021 showed moderate obstructive sleep apnea with an AHI of 15.5/h and also has nocturnal hypoxemia by ONO -She is completely intolerant to O2 or any type of CPAP mask and will not consider CPAP -she is not a candidate for the Inspire device at this time due to high BMI at 45 -the oral device is cost prohibitive -Really no good options for her at this time>>I have asked her to work on weight loss>>I have asked her to talk with her PCP to see if she is a candidate for [facility] for weight loss and if she can get her BMI<40 then may be a candidate  2.  Hypertension -BP is trolled on exam today -stop amlodipine given her as episodes of SVT and start on Lopressor 25 mg twice daily  3.  SVT -She was noted to have intermittent runs of tachycardia during exam and EKG showed intermittent SVT which was terminated to normal sinus rhythm -I will get a 2-week Zio patch to determine her SVT load -Stop amlodipine and start Lopressor 25 mg twice daily  Followup with Dr. Azucena Cecil in 3 weeks  Time Spent: 20 minutes total time of encounter,  including 15 minutes spent in face-to-face patient care on the date of this encounter. This time includes coordination of care and counseling regarding above mentioned problem list. Remainder of non-face-to-face time involved reviewing chart documents/testing relevant to the patient encounter and documentation in the medical record. I have independently reviewed documentation from referring provider  Medication Adjustments/Labs and Tests Ordered: Current medicines are reviewed at length with the patient today.  Concerns regarding medicines are outlined above.  Medication changes, Labs and Tests ordered today are listed in the Patient Instructions below.  There are no Patient Instructions on file for this visit.   Signed, Armanda Magic, MD  08/07/2022 11:21 AM    [facility] [address] [place], [place], [place]  [address] Phone: [phone]; Fax: [phone]

## 2022-08-07 NOTE — Progress Notes (Unsigned)
Enrolled for [employer] to mail a ZIO XT long term holter monitor to the patients address on file.  Letter with instructions mailed to patient. 

## 2022-08-07 NOTE — Addendum Note (Signed)
Addended by: Theresia Majors on: 08/07/2022 11:30 AM   Modules accepted: Orders

## 2022-08-07 NOTE — Patient Instructions (Signed)
Medication Instructions:  Your physician has recommended you make the following change in your medication:  1) STOP taking amlodipine 2) START taking Lopressor 25 mg twice daily  *If you need a refill on your cardiac medications before your next appointment, please call your pharmacy*  Testing/Procedures: Your physician has recommended that you wear an event monitor. Event monitors are medical devices that record the heart's electrical activity. Doctors most often [place] these monitors to diagnose arrhythmias. Arrhythmias are problems with the speed or rhythm of the heartbeat. The monitor is a small, portable device. You can wear one while you do your normal daily activities. This is usually used to diagnose what is causing palpitations/syncope (passing out).   Follow-Up: At [facility], you and your health needs are our priority.  As part of our continuing mission to provide you with exceptional heart care, we have created designated Provider Care Teams.  These Care Teams include your primary Cardiologist (physician) and Advanced Practice Providers (APPs -  Physician Assistants and Nurse Practitioners) who all work together to provide you with the care you need, when you need it.  Your next appointment:   4-6 week(s)  The format for your next appointment:   In Person  Provider:   You may see Kate Sable, MD or one of the following Advanced Practice Providers on your designated Care Team:   Murray Hodgkins, NP Christell Faith, PA-C Cadence Kathlen Mody, PA-C{   Other Instructions: Elwyn Reach AT Long term monitor-Live Telemetry  Your physician has requested you wear a ZIO patch monitor for 14 days.  This is a single patch monitor. [employer] supplies one patch monitor per enrollment. Additional  stickers are not available.  Please do not apply patch if you will be having a Nuclear Stress Test, Echocardiogram, Cardiac CT, MRI,  or Chest Xray during the period you would be wearing the monitor. The  patch cannot be worn during  these tests. You cannot remove and re-apply the ZIO AT patch monitor.  Your ZIO patch monitor will be mailed 3 day USPS to your address on file. It may take 3-5 days to  receive your monitor after you have been enrolled.  Once you have received your monitor, please review the enclosed instructions. Your monitor has  already been registered assigning a specific monitor serial # to you.   Billing and Patient Assistance Program information  Autumn Price has been supplied with any insurance information on record for billing. [employer] offers a sliding scale Patient Assistance Program for patients without insurance, or whose  insurance does not completely cover the cost of the ZIO patch monitor. You must apply for the  Patient Assistance Program to qualify for the discounted rate. To apply, call [employer] at [phone],  select option 4, select option 2 , ask to apply for the Patient Assistance Program, (you can request an  interpreter if needed). [employer] will ask your household income and how many people are in your  household. [employer] will quote your out-of-pocket cost based on this information. They will also be able  to set up a 12 month interest free payment plan if needed.  Applying the monitor   Shave hair from upper left chest.  Hold the abrader disc by orange tab. Rub the abrader in 40 strokes over left upper chest as indicated in  your monitor instructions.  Clean area with 4 enclosed alcohol pads. Use all pads to ensure the area is cleaned thoroughly. Let  dry.  Apply patch as indicated in monitor instructions.  Patch will be placed under collarbone on left side of  chest with arrow pointing upward.  Rub patch adhesive wings for 2 minutes. Remove the white label marked "1". Remove the white label  marked "2". Rub patch adhesive wings for 2 additional minutes.  While looking in a mirror, press and release button in center of patch. A small green light will flash  3-4  times. This will be your only indicator that the monitor has been turned on.  Do not shower for the first 24 hours. You may shower after the first 24 hours.  Press the button if you feel a symptom. You will hear a small click. Record Date, Time and Symptom in  the Patient Log.   Starting the Gateway  In your kit there is a Hydrographic surveyor box the size of a cellphone. This is Airline pilot. It transmits all your  recorded data to [facility]. This box must always stay within 10 feet of you. Open the box and push the *  button. There will be a light that blinks orange and then green a few times. When the light stops  blinking, the Gateway is connected to the ZIO patch. Call [employer] at [phone] to confirm your monitor is transmitting.  Returning your monitor  Remove your patch and place it inside the [place]. In the lower half of the Gateway there is a white  bag with prepaid postage on it. Place Gateway in bag and seal. Mail package back to [place] as soon as  possible. Your physician should have your final report approximately 7 days after you have mailed back  your monitor. Call [place] at [phone] if you have questions regarding your ZIO AT  patch monitor. Call them immediately if you see an orange light blinking on your monitor.  If your monitor falls off in less than 4 days, contact our Monitor department at [phone]. If your  monitor becomes loose or falls off after 4 days call [employer] at [phone] for suggestions on  securing your monitor   Important Information About Sugar

## 2022-08-10 DIAGNOSIS — I471 Supraventricular tachycardia: Secondary | ICD-10-CM | POA: Diagnosis not present

## 2022-08-10 DIAGNOSIS — G4733 Obstructive sleep apnea (adult) (pediatric): Secondary | ICD-10-CM | POA: Diagnosis not present

## 2022-08-10 DIAGNOSIS — I10 Essential (primary) hypertension: Secondary | ICD-10-CM | POA: Diagnosis not present

## 2022-08-12 ENCOUNTER — Other Ambulatory Visit: Payer: Self-pay | Admitting: Internal Medicine

## 2022-08-12 ENCOUNTER — Ambulatory Visit: Payer: Self-pay

## 2022-08-12 DIAGNOSIS — I5032 Chronic diastolic (congestive) heart failure: Secondary | ICD-10-CM | POA: Diagnosis not present

## 2022-08-12 DIAGNOSIS — G4733 Obstructive sleep apnea (adult) (pediatric): Secondary | ICD-10-CM | POA: Diagnosis not present

## 2022-08-12 NOTE — Patient Outreach (Signed)
  Care Coordination   08/12/2022 Name: Autumn Price MRN: [id number] DOB: 12/24/1940   Care Coordination Outreach Attempts:  A second unsuccessful outreach was attempted today to offer the patient with information about available care coordination services as a benefit of their health plan.     Follow Up Plan:  Additional outreach attempts will be made to offer the patient care coordination information and services.   Encounter Outcome:  No Answer  Care Coordination Interventions Activated:  No   Care Coordination Interventions:  No, not indicated    George Ina RN,BSN,CCM RN Care Manager Coordinator [phone]

## 2022-08-13 NOTE — Telephone Encounter (Signed)
Last filled 07-16-22 #120 Last OV 07-03-22 Next OV 01-07-23 [facility]

## 2022-08-15 ENCOUNTER — Ambulatory Visit: Payer: Self-pay

## 2022-08-15 NOTE — Patient Outreach (Signed)
  Care Coordination   Initial Visit Note   08/15/2022 Name: Autumn Price MRN: [id number] DOB: 06/26/1941  Autumn Price is a 81 y.o. year old female who sees Karie Schwalbe, MD for primary care. I spoke with  Autumn Price by phone today.  What matters to the patients health and wellness today?  Patient states she does not have any nursing or community resource needs at this time.     Goals Addressed             This Visit's Progress    COMPLETED: Care coordination activities - No further follow up needed       Care Coordination Interventions: Case Management Program / Services discussed Annual wellness visit exam discussed. Advised to follow up with provider office to schedule Social determinants of health survey completed Advised to contact provider if care coordination services are needed in the future          SDOH assessments and interventions completed:  Yes  SDOH Interventions Today    Flowsheet Row Most Recent Value  SDOH Interventions   Food Insecurity Interventions Intervention Not Indicated  Housing Interventions Intervention Not Indicated  Transportation Interventions Intervention Not Indicated        Care Coordination Interventions Activated:  No  Care Coordination Interventions:  No, not indicated   Follow up plan: No further intervention required.   Encounter Outcome:  Pt. Visit Completed   George Ina RN,BSN,CCM RN Care Manager Coordinator [phone]

## 2022-08-15 NOTE — Patient Outreach (Signed)
  Care Coordination   08/15/2022 Name: Autumn Price MRN: [id number] DOB: November 16, 1941   Care Coordination Outreach Attempts:  A third unsuccessful outreach was attempted today to offer the patient with information about available care coordination services as a benefit of their health plan.   Follow Up Plan:  Additional outreach attempts will be made to offer the patient care coordination information and services.   Encounter Outcome:  No Answer  Care Coordination Interventions Activated:  No   Care Coordination Interventions:  No, not indicated    George Ina RN,BSN,CCM RN Care Manager Coordinator [phone]

## 2022-08-16 ENCOUNTER — Encounter (INDEPENDENT_AMBULATORY_CARE_PROVIDER_SITE_OTHER): Payer: Self-pay | Admitting: Nurse Practitioner

## 2022-08-16 ENCOUNTER — Ambulatory Visit (INDEPENDENT_AMBULATORY_CARE_PROVIDER_SITE_OTHER): Payer: Medicare Other | Admitting: Nurse Practitioner

## 2022-08-16 VITALS — BP 150/63 | HR 71 | Resp 18 | Ht 62.0 in | Wt 274.2 lb

## 2022-08-16 DIAGNOSIS — M7989 Other specified soft tissue disorders: Secondary | ICD-10-CM

## 2022-08-16 DIAGNOSIS — N1831 Chronic kidney disease, stage 3a: Secondary | ICD-10-CM

## 2022-08-16 DIAGNOSIS — M1711 Unilateral primary osteoarthritis, right knee: Secondary | ICD-10-CM

## 2022-08-16 NOTE — Progress Notes (Signed)
Subjective:    Patient ID: Autumn Price, female    DOB: [birth year], 81 y.o.   MRN: [id number] Chief Complaint  Patient presents with   Establish Care    Referred by Dr Azucena Cecil    Autumn Price is a 81 year old female that presents today as a referral from Dr. Azucena Cecil in regards to her lower extremity edema.  She notes that this was going on for about 5 years.  She notes that it is worse after knee surgery.  The patient notes that the swelling is primarily from her hips and buttocks area down to slightly below her knees.  She notes that the feet and ankles typically do not have much issues with swelling.  She denies any open wounds weeping or ulceration.    Review of Systems  Cardiovascular:  Positive for leg swelling.  All other systems reviewed and are negative.      Objective:   Physical Exam Vitals reviewed.  HENT:     Head: Normocephalic.  Cardiovascular:     Rate and Rhythm: Normal rate.     Pulses: Normal pulses.  Pulmonary:     Effort: Pulmonary effort is normal.  Skin:    General: Skin is warm and dry.  Neurological:     Mental Status: She is alert and oriented to person, place, and time.  Psychiatric:        Mood and Affect: Mood normal.        Behavior: Behavior normal.        Thought Content: Thought content normal.        Judgment: Judgment normal.     BP (!) 150/63 (BP Location: Right Arm)   Pulse 71   Resp 18   Ht 5\' 2"  (1.575 m)   Wt 274 lb 3.2 oz (124.4 kg)   BMI 50.15 kg/m   Past Medical History:  Diagnosis Date   Depression    Hypertension    Osteoarthritis    Urge incontinence    Venous insufficiency     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    Comment: [employer]  Tobacco Use   Smoking status:  Never   Smokeless tobacco: Never  Vaping Use   Vaping Use: Never used  Substance and Sexual Activity   Alcohol use: No   Drug use: No   Sexual activity: Not Currently  Other Topics Concern   Not on file  Social History Narrative   Has living will   Designated son and other children to make health care decisions for her   Would accept CPR/resuscitation   Hasn't considered tube feedings--would leave to son   Social Determinants of Health   Financial Resource Strain: Low Risk  (08/18/2021)   Overall Financial Resource Strain (CARDIA)    Difficulty of Paying Living Expenses: Not hard at all  Food Insecurity: No Food Insecurity (08/15/2022)   Hunger Vital Sign    Worried About Running Out of Food in the Last Year: Never true    Ran Out of Food in the Last Year: Never true  Transportation Needs: No Transportation Needs (08/15/2022)   PRAPARE - 08/17/2022 (Medical): No    Lack of Transportation (  Non-Medical): No  Physical Activity: Insufficiently Active (08/18/2021)   Exercise Vital Sign    Days of Exercise per Week: 4 days    Minutes of Exercise per Session: 10 min  Stress: No Stress Concern Present (08/18/2021)   [employer] of Occupational Health - Occupational Stress Questionnaire    Feeling of Stress : Only a little  Social Connections: Moderately Isolated (08/18/2021)   Social Connection and Isolation Panel [NHANES]    Frequency of Communication with Friends and Family: More than three times a week    Frequency of Social Gatherings with Friends and Family: Never    Attends Religious Services: 1 to 4 times per year    Active Member of [employer] or Organizations: No    Attends Banker Meetings: Never    Marital Status: Divorced  Catering manager Violence: Not At Risk (08/18/2021)   Humiliation, Afraid, Rape, and Kick questionnaire    Fear of Current or Ex-Partner: No    Emotionally Abused: No    Physically Abused: No     Sexually Abused: No    Past Surgical History:  Procedure Laterality Date   APPENDECTOMY  1966   DILATION AND CURETTAGE OF UTERUS  01/27/14   benign   JOINT REPLACEMENT     right knee   TOTAL KNEE ARTHROPLASTY Right 03/20/2018   Procedure: RIGHT TOTAL KNEE ARTHROPLASTY;  Surgeon: Eugenia Mcalpine, MD;  Location: [facility];  Service: Orthopedics;  Laterality: Right;   TOTAL KNEE ARTHROPLASTY Left 09/11/2018   Procedure: LEFT TOTAL KNEE ARTHROPLASTY;  Surgeon: Eugenia Mcalpine, MD;  Location: [facility];  Service: Orthopedics;  Laterality: Left;   TUBAL LIGATION  1966    Family History  Problem Relation Age of Onset   Heart disease Father        died age 72- MI   Alcohol abuse Brother    Heart disease Brother    Heart disease Sister    Breast cancer Neg Hx     No Known Allergies     Latest Ref Rng & Units 02/21/2022   11:57 AM 01/02/2022   12:57 PM 12/27/2020   12:40 PM  CBC  WBC 4.0 - 10.5 K/uL 5.8  5.1  5.3   Hemoglobin 12.0 - 15.0 g/dL 06.2  69.4  85.4   Hematocrit 36.0 - 46.0 % 45.5  45.6  44.3   Platelets 150 - 400 K/uL 166  138.0  144.0       CMP     Component Value Date/Time   NA 141 02/21/2022 1157   NA 142 01/20/2014 1303   K 4.2 02/21/2022 1157   K 4.4 01/20/2014 1303   CL 106 02/21/2022 1157   CL 106 01/20/2014 1303   CO2 28 02/21/2022 1157   CO2 33 (H) 01/20/2014 1303   GLUCOSE 125 (H) 02/21/2022 1157   GLUCOSE 129 (H) 01/20/2014 1303   BUN 14 02/21/2022 1157   BUN 13 01/20/2014 1303   CREATININE 0.77 02/21/2022 1157   CREATININE 0.72 01/20/2014 1303   CALCIUM 9.5 02/21/2022 1157   CALCIUM 9.6 01/20/2014 1303   PROT 8.0 02/21/2022 1157   PROT 7.4 01/20/2014 1303   ALBUMIN 4.3 02/21/2022 1157   ALBUMIN 3.9 01/20/2014 1303   AST 21 02/21/2022 1157   AST 13 (L) 01/20/2014 1303   ALT 12 02/21/2022 1157   ALT 21 01/20/2014 1303   ALKPHOS 88 02/21/2022 1157   ALKPHOS 95 01/20/2014 1303   BILITOT 0.8 02/21/2022 1157   BILITOT 0.5  01/20/2014 1303   GFRNONAA  >60 02/21/2022 1157   GFRNONAA >60 01/20/2014 1303   GFRAA >60 10/08/2019 1844   GFRAA >60 01/20/2014 1303     No results found.     Assessment & Plan:   1. Leg swelling The patient does have some lower extremity edema however the symptoms are not quite consistent with lymphedema.  Typically those of lymphedema have significant swelling and discomfort around the feet and ankles and lower legs.  Patient notes that now she actually has no issues right lower leg area and that the majority of her skin is in her area right below her knees.  Melissa.  Following surgery some of this may also be related to overall body habitus.  There is a possibility of venous insufficiency.  We will have the patient return with noninvasive studies to evaluate for possible venous insufficiency.  2. Stage 3a chronic kidney disease (HCC) This may be a component factor in the edema  3. Primary osteoarthritis of right knee Continue NSAID medications as already ordered, these medications have been reviewed and there are no changes at this time.  Continued activity and therapy was stressed.    Current Outpatient Medications on File Prior to Visit  Medication Sig Dispense Refill   acetaminophen (TYLENOL) 500 MG tablet Take 500 mg by mouth every 6 (six) hours as needed for mild pain or moderate pain.     albuterol (VENTOLIN HFA) 108 (90 Base) MCG/ACT inhaler INHALE 1 TO 2 PUFFS BY MOUTH INTO THE LUNGS EVERY 6 HOURS AS NEEDED FOR WHEEZING OR SHORTNESS OF BREATH 8.5 g 0   furosemide (LASIX) 20 MG tablet TAKE ONE TABLET BY MOUTH ONCE DAILY 30 tablet 11   ketoconazole (NIZORAL) 2 % cream APPLY 1 APPLICATION TOPICALLY TO GROIN AREA 2 TIMES A DAY AS NEEDED FOR IRRITATION 30 g 3   metoprolol tartrate (LOPRESSOR) 25 MG tablet Take 1 tablet (25 mg total) by mouth 2 (two) times daily. 180 tablet 3   mirabegron ER (MYRBETRIQ) 50 MG TB24 tablet TAKE 1 TABLET ONCE DAILY 90 tablet 3   montelukast (SINGULAIR) 10 MG tablet Take 1  tablet (10 mg total) by mouth at bedtime. 30 tablet 11   sertraline (ZOLOFT) 50 MG tablet Take 1 tablet (50 mg total) by mouth daily. 90 tablet 0   traMADol (ULTRAM) 50 MG tablet Take 1-2 tablets (50-100 mg total) by mouth every 12 (twelve) hours as needed. 120 tablet 0   No current facility-administered medications on file prior to visit.    There are no Patient Instructions on file for this visit. No follow-ups on file.   Georgiana Spinner, NP

## 2022-08-16 NOTE — Progress Notes (Incomplete)
Subjective:    Patient ID: Autumn Price, female    DOB: Oct 18, 1941, 81 y.o.   MRN: [id number] Chief Complaint  Patient presents with  . Establish Care    Referred by Dr Azucena Cecil    Autumn Price is a 81 year old female that presents today as a referral from Dr. Azucena Cecil in regards to her lower extremity edema.  She notes that this was going on for about 5 years.  She notes that it is worse after knee surgery.  The patient notes that the swelling is primarily from her hips and buttocks area down to slightly below her knees.  She notes that the feet and ankles typically do not have much issues with swelling.  She denies any open wounds weeping or ulceration.    Review of Systems  Cardiovascular:  Positive for leg swelling.  All other systems reviewed and are negative.      Objective:   Physical Exam Vitals reviewed.  HENT:     Head: Normocephalic.  Cardiovascular:     Rate and Rhythm: Normal rate.  Pulmonary:     Effort: Pulmonary effort is normal.  Skin:    General: Skin is warm and dry.  Neurological:     Mental Status: She is alert and oriented to person, place, and time.  Psychiatric:        Mood and Affect: Mood normal.        Behavior: Behavior normal.        Thought Content: Thought content normal.        Judgment: Judgment normal.     BP (!) 150/63 (BP Location: Right Arm)   Pulse 71   Resp 18   Ht 5\' 2"  (1.575 m)   Wt 274 lb 3.2 oz (124.4 kg)   BMI 50.15 kg/m   Past Medical History:  Diagnosis Date  . Depression   . Hypertension   . Osteoarthritis   . Urge incontinence   . Venous insufficiency     Social History   Socioeconomic History  . Marital status: Divorced    Spouse name: Not on file  . Number of children: 3  . Years of education: Not on file  . Highest education level: Not on file  Occupational History  . 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 Smoking status: Never  .  Smokeless tobacco: Never  Vaping Use  . Vaping Use: Never used  Substance and Sexual Activity  . Alcohol use: No  . Drug use: No  . Sexual activity: Not Currently  Other Topics Concern  . Not on file  Social History Narrative   Has living will   Designated son and other children to make health care decisions for her   Would accept CPR/resuscitation   Hasn't considered tube feedings--would leave to son   Social Determinants of Health   Financial Resource Strain: Low Risk  (08/18/2021)   Overall Financial Resource Strain (CARDIA)   . Difficulty of Paying Living Expenses: Not hard at all  Food Insecurity: No Food Insecurity (08/15/2022)   Hunger Vital Sign   . Worried About 08/17/2022 in the Last Year: Never true   . Ran Out of Food in the Last Year: Never true  Transportation Needs: No Transportation Needs (08/15/2022)   PRAPARE - Transportation   . Lack of Transportation (Medical): No   . Lack of Transportation (Non-Medical): No  Physical Activity: Insufficiently Active (  08/18/2021)   Exercise Vital Sign   . Days of Exercise per Week: 4 days   . Minutes of Exercise per Session: 10 min  Stress: No Stress Concern Present (08/18/2021)   [employer] of Occupational Health - Occupational Stress Questionnaire   . Feeling of Stress : Only a little  Social Connections: Moderately Isolated (08/18/2021)   Social Connection and Isolation Panel [NHANES]   . Frequency of Communication with Friends and Family: More than three times a week   . Frequency of Social Gatherings with Friends and Family: Never   . Attends Religious Services: 1 to 4 times per year   . Active Member of Clubs or Organizations: No   . Attends Banker Meetings: Never   . Marital Status: Divorced  Catering manager Violence: Not At Risk (08/18/2021)   Humiliation, Afraid, Rape, and Kick questionnaire   . Fear of Current or Ex-Partner: No   . Emotionally Abused: No   . Physically  Abused: No   . Sexually Abused: No    Past Surgical History:  Procedure Laterality Date  . APPENDECTOMY  1966  . DILATION AND CURETTAGE OF UTERUS  01/27/14   benign  . JOINT REPLACEMENT     right knee  . TOTAL KNEE ARTHROPLASTY Right 03/20/2018   Procedure: RIGHT TOTAL KNEE ARTHROPLASTY;  Surgeon: Eugenia Mcalpine, MD;  Location: [facility];  Service: Orthopedics;  Laterality: Right;  . TOTAL KNEE ARTHROPLASTY Left 09/11/2018   Procedure: LEFT TOTAL KNEE ARTHROPLASTY;  Surgeon: Eugenia Mcalpine, MD;  Location: [facility];  Service: Orthopedics;  Laterality: Left;  . TUBAL LIGATION  1966    Family History  Problem Relation Age of Onset  . Heart disease Father        died age 20- MI  . Alcohol abuse Brother   . Heart disease Brother   . Heart disease Sister   . Breast cancer Neg Hx     No Known Allergies     Latest Ref Rng & Units 02/21/2022   11:57 AM 01/02/2022   12:57 PM 12/27/2020   12:40 PM  CBC  WBC 4.0 - 10.5 K/uL 5.8  5.1  5.3   Hemoglobin 12.0 - 15.0 g/dL 09.9  83.3  82.5   Hematocrit 36.0 - 46.0 % 45.5  45.6  44.3   Platelets 150 - 400 K/uL 166  138.0  144.0       CMP     Component Value Date/Time   NA 141 02/21/2022 1157   NA 142 01/20/2014 1303   K 4.2 02/21/2022 1157   K 4.4 01/20/2014 1303   CL 106 02/21/2022 1157   CL 106 01/20/2014 1303   CO2 28 02/21/2022 1157   CO2 33 (H) 01/20/2014 1303   GLUCOSE 125 (H) 02/21/2022 1157   GLUCOSE 129 (H) 01/20/2014 1303   BUN 14 02/21/2022 1157   BUN 13 01/20/2014 1303   CREATININE 0.77 02/21/2022 1157   CREATININE 0.72 01/20/2014 1303   CALCIUM 9.5 02/21/2022 1157   CALCIUM 9.6 01/20/2014 1303   PROT 8.0 02/21/2022 1157   PROT 7.4 01/20/2014 1303   ALBUMIN 4.3 02/21/2022 1157   ALBUMIN 3.9 01/20/2014 1303   AST 21 02/21/2022 1157   AST 13 (L) 01/20/2014 1303   ALT 12 02/21/2022 1157   ALT 21 01/20/2014 1303   ALKPHOS 88 02/21/2022 1157   ALKPHOS 95 01/20/2014 1303   BILITOT 0.8 02/21/2022 1157   BILITOT 0.5  01/20/2014 1303   GFRNONAA >60  02/21/2022 1157   GFRNONAA >60 01/20/2014 1303   GFRAA >60 10/08/2019 1844   GFRAA >60 01/20/2014 1303     No results found.     Assessment & Plan:   1. Leg swelling ***  2. Stage 3a chronic kidney disease (HCC) ***  3. Primary osteoarthritis of right knee ***   Current Outpatient Medications on File Prior to Visit  Medication Sig Dispense Refill  . acetaminophen (TYLENOL) 500 MG tablet Take 500 mg by mouth every 6 (six) hours as needed for mild pain or moderate pain.    Marland Kitchen albuterol (VENTOLIN HFA) 108 (90 Base) MCG/ACT inhaler INHALE 1 TO 2 PUFFS BY MOUTH INTO THE LUNGS EVERY 6 HOURS AS NEEDED FOR WHEEZING OR SHORTNESS OF BREATH 8.5 g 0  . furosemide (LASIX) 20 MG tablet TAKE ONE TABLET BY MOUTH ONCE DAILY 30 tablet 11  . ketoconazole (NIZORAL) 2 % cream APPLY 1 APPLICATION TOPICALLY TO GROIN AREA 2 TIMES A DAY AS NEEDED FOR IRRITATION 30 g 3  . metoprolol tartrate (LOPRESSOR) 25 MG tablet Take 1 tablet (25 mg total) by mouth 2 (two) times daily. 180 tablet 3  . mirabegron ER (MYRBETRIQ) 50 MG TB24 tablet TAKE 1 TABLET ONCE DAILY 90 tablet 3  . montelukast (SINGULAIR) 10 MG tablet Take 1 tablet (10 mg total) by mouth at bedtime. 30 tablet 11  . sertraline (ZOLOFT) 50 MG tablet Take 1 tablet (50 mg total) by mouth daily. 90 tablet 0  . traMADol (ULTRAM) 50 MG tablet Take 1-2 tablets (50-100 mg total) by mouth every 12 (twelve) hours as needed. 120 tablet 0   No current facility-administered medications on file prior to visit.    There are no Patient Instructions on file for this visit. No follow-ups on file.   Georgiana Spinner, NP

## 2022-08-31 DIAGNOSIS — G4733 Obstructive sleep apnea (adult) (pediatric): Secondary | ICD-10-CM | POA: Diagnosis not present

## 2022-08-31 DIAGNOSIS — I471 Supraventricular tachycardia: Secondary | ICD-10-CM | POA: Diagnosis not present

## 2022-09-03 ENCOUNTER — Other Ambulatory Visit
Admission: RE | Admit: 2022-09-03 | Discharge: 2022-09-03 | Disposition: A | Discharge status: Home / Self Care | Payer: Medicare Other | Attending: Primary Care | Admitting: Primary Care

## 2022-09-03 ENCOUNTER — Encounter: Payer: Self-pay | Admitting: Primary Care

## 2022-09-03 ENCOUNTER — Ambulatory Visit (INDEPENDENT_AMBULATORY_CARE_PROVIDER_SITE_OTHER): Payer: Medicare Other | Admitting: Primary Care

## 2022-09-03 VITALS — BP 140/84 | HR 64 | Temp 97.7°F | Ht 62.0 in | Wt 276.4 lb

## 2022-09-03 DIAGNOSIS — I5032 Chronic diastolic (congestive) heart failure: Secondary | ICD-10-CM

## 2022-09-03 DIAGNOSIS — J9611 Chronic respiratory failure with hypoxia: Secondary | ICD-10-CM | POA: Diagnosis not present

## 2022-09-03 DIAGNOSIS — I872 Venous insufficiency (chronic) (peripheral): Secondary | ICD-10-CM | POA: Diagnosis not present

## 2022-09-03 DIAGNOSIS — G4730 Sleep apnea, unspecified: Secondary | ICD-10-CM | POA: Insufficient documentation

## 2022-09-03 DIAGNOSIS — D7218 Eosinophilia in diseases classified elsewhere: Secondary | ICD-10-CM | POA: Diagnosis not present

## 2022-09-03 DIAGNOSIS — K219 Gastro-esophageal reflux disease without esophagitis: Secondary | ICD-10-CM | POA: Insufficient documentation

## 2022-09-03 DIAGNOSIS — Z8669 Personal history of other diseases of the nervous system and sense organs: Secondary | ICD-10-CM | POA: Diagnosis not present

## 2022-09-03 DIAGNOSIS — J40 Bronchitis, not specified as acute or chronic: Secondary | ICD-10-CM

## 2022-09-03 DIAGNOSIS — R062 Wheezing: Secondary | ICD-10-CM | POA: Insufficient documentation

## 2022-09-03 LAB — CBC WITH DIFFERENTIAL/PLATELET
Abs Immature Granulocytes: 0.03 10*3/uL (ref 0.00–0.07)
Basophils Absolute: 0.1 10*3/uL (ref 0.0–0.1)
Basophils Relative: 1 %
Eosinophils Absolute: 0.2 10*3/uL (ref 0.0–0.5)
Eosinophils Relative: 4 %
HCT: 42.9 % (ref 36.0–46.0)
Hemoglobin: 13.4 g/dL (ref 12.0–15.0)
Immature Granulocytes: 1 %
Lymphocytes Relative: 20 %
Lymphs Abs: 1 10*3/uL (ref 0.7–4.0)
MCH: 28.1 pg (ref 26.0–34.0)
MCHC: 31.2 g/dL (ref 30.0–36.0)
MCV: 89.9 fL (ref 80.0–100.0)
Monocytes Absolute: 0.5 10*3/uL (ref 0.1–1.0)
Monocytes Relative: 9 %
Neutro Abs: 3.2 10*3/uL (ref 1.7–7.7)
Neutrophils Relative %: 65 %
Platelets: 146 10*3/uL — ABNORMAL LOW (ref 150–400)
RBC: 4.77 MIL/uL (ref 3.87–5.11)
RDW: 13.4 % (ref 11.5–15.5)
WBC: 4.9 10*3/uL (ref 4.0–10.5)
nRBC: 0 % (ref 0.0–0.2)

## 2022-09-03 LAB — BASIC METABOLIC PANEL
Anion gap: 8 (ref 5–15)
BUN: 21 mg/dL (ref 8–23)
CO2: 26 mmol/L (ref 22–32)
Calcium: 9.6 mg/dL (ref 8.9–10.3)
Chloride: 107 mmol/L (ref 98–111)
Creatinine, Ser: 0.96 mg/dL (ref 0.44–1.00)
GFR, Estimated: >60 mL/min (ref >60)
Glucose, Bld: 106 mg/dL — ABNORMAL HIGH (ref 70–99)
Potassium: 4.5 mmol/L (ref 3.5–5.1)
Sodium: 141 mmol/L (ref 135–145)

## 2022-09-03 LAB — BRAIN NATRIURETIC PEPTIDE: B Natriuretic Peptide: 257.3 pg/mL — ABNORMAL HIGH (ref 0.0–100.0)

## 2022-09-03 MED ORDER — PANTOPRAZOLE SODIUM 40 MG PO TBEC: 40.0000 mg | DELAYED_RELEASE_TABLET | Freq: Every day | ORAL | 3 refills | Status: DC

## 2022-09-03 MED ORDER — BUDESONIDE 0.5 MG/2ML IN SUSP: 0.5000 mg | Freq: Two times a day (BID) | RESPIRATORY_TRACT | 5 refills | Status: DC

## 2022-09-03 NOTE — Patient Instructions (Addendum)
Recommendations: - Resume Protonix 40mg  daily - Start Pulmicort nebulizer morning and evening  - Use albuterol 2 puffs every 4-6 hours as needed for shortness of breath/wheezing  - Continue to wear 2l oxygen at bedtime and focus on side sleeping position   Orders: - New nebulizer machine  - Labs today   Follow-up: - 2 months with Dr. Patsey Berthold

## 2022-09-03 NOTE — Progress Notes (Signed)
Reviewed and agree with assessment/plan.   Chesley Mires, MD [facility] Pulmonary/Critical Care 09/03/2022, 7:52 PM Pager:  [phone]

## 2022-09-03 NOTE — Progress Notes (Signed)
@Patient  ID: , female    DOB: 1941-04-23, 81 y.o.   MRN: [id number]  Chief Complaint  Patient presents with   Follow-up    Wheezing and prod cough with yellowish sputum.     Referring provider: [id number], MD  HPI: 81 year old female, never smoked.  Past medical history significant for eosinophilic bronchitis, chronic respiratory failure with hypoxia.  Current patient of Dr. 76, last seen on 02/21/2022.   Previous [facility] pulmonary encounter: 06/14/2022 Patient presents today for 6 to 8-week follow-up. She is intolerant to DPI's, started on Symbicort 80 during last visit.  Maintained on 2 L of oxygen at night and with exertion. She has a productive cough with clear sputum. Associated wheezing. She is using Symbicort, need spacer. She went to UC on 06/03/22 d/t cough. She had a CXR on 06/03/22 which showed no active cardiopulmonary disease, small linear densities in the lateral aspect of left lower lung field suggest minimal scarring. She had a CTA in 2020 that showed bilateral linear and subsegmental atelectasis.   She continues to have cough. She was given tesslon perles which had not helped. She did notice improvement while on 5 day course of steroids. She has post nasal drip first thing in the morning. Cardiology referred her to sleep specialist on [address], concern she may have sleep apnea and advised weight loss.   Eosinophilic bronchitis - Chronic cough with clear sputum. Associated wheezing and dyspnea with exertion. Lungs were clear on exam today. CXR in June without acute process, linear scarring left lower lobe. Continue Symbicort July two puffs twice daily with spacer. RX prednisone taper and start montelukast 10mg  at bedtime. Recommend patient take delsym cough syrup 10ml q12 hours as needed. FU in 3 months or sooner if needed.    Chronic hypoxemic respiratory failure (HCC) - O2 96% RA with ambulation  - Continue to wear 2L supplemental oxygen at bedtime and  with exertion to maintain O2 >88-90%   09/03/2022- interim hx  Patient presents today for 3 month follow-up. She is doing well overall but reports daily symptoms of wheezing which is worse at night. Symbicort made her cough worse so she quit taking it. Over the weekend she developed a productive cough with light yellow mucus. She was intolerant to Arnuity and Symbicort. CXR in June 2023 showed no active cardiopulmonary disease, small transverse linear densities left lower lung suggest scarring. She was intolerant to CPAP, not candidate for inspire device . She is wearing 2L oxygen at night. She has noticed some weight gain and leg swelling. She takes lasix 20mg  daily, did not take today d/t appointment.   No Known Allergies  Immunization History  Administered Date(s) Administered   Fluad Quad(high Dose 65+) 10/11/2020   Influenza Inj Mdck Quad Pf 10/30/2018, 09/25/2019   Influenza, Seasonal, Injecte, Preservative Fre 09/30/2016   Influenza,inj,Quad PF,6+ Mos 09/12/2017   Influenza-Unspecified 09/12/2021   Pneumococcal Conjugate-13 06/10/2014   Pneumococcal Polysaccharide-23 01/16/2009, 08/12/2017   Td 08/21/2010   Zoster Recombinat (Shingrix) 09/30/2019, 01/11/2020   Zoster, Live 09/18/2016    Past Medical History:  Diagnosis Date   Depression    Hypertension    Osteoarthritis    Urge incontinence    Venous insufficiency     Tobacco History: Social History   Tobacco Use  Smoking Status Never  Smokeless Tobacco Never   Counseling given: Not Answered   Outpatient Medications Prior to Visit  Medication Sig Dispense Refill   acetaminophen (TYLENOL) 500 MG tablet  Take 500 mg by mouth every 6 (six) hours as needed for mild pain or moderate pain.     albuterol (VENTOLIN HFA) 108 (90 Base) MCG/ACT inhaler INHALE 1 TO 2 PUFFS BY MOUTH INTO THE LUNGS EVERY 6 HOURS AS NEEDED FOR WHEEZING OR SHORTNESS OF BREATH 8.5 g 0   furosemide (LASIX) 20 MG tablet TAKE ONE TABLET BY MOUTH ONCE  DAILY 30 tablet 11   ketoconazole (NIZORAL) 2 % cream APPLY 1 APPLICATION TOPICALLY TO GROIN AREA 2 TIMES A DAY AS NEEDED FOR IRRITATION 30 g 3   metoprolol tartrate (LOPRESSOR) 25 MG tablet Take 1 tablet (25 mg total) by mouth 2 (two) times daily. 180 tablet 3   mirabegron ER (MYRBETRIQ) 50 MG TB24 tablet TAKE 1 TABLET ONCE DAILY 90 tablet 3   montelukast (SINGULAIR) 10 MG tablet Take 1 tablet (10 mg total) by mouth at bedtime. 30 tablet 11   sertraline (ZOLOFT) 50 MG tablet Take 1 tablet (50 mg total) by mouth daily. 90 tablet 0   traMADol (ULTRAM) 50 MG tablet Take 1-2 tablets (50-100 mg total) by mouth every 12 (twelve) hours as needed. 120 tablet 0   No facility-administered medications prior to visit.      Review of Systems  Review of Systems  Constitutional: Negative.   HENT: Negative.    Respiratory:  Positive for cough, shortness of breath and wheezing.      Physical Exam  BP (!) 140/84 (BP Location: Left Arm, Cuff Size: Large)   Pulse 64   Temp 97.7 F (36.5 C) (Temporal)   Ht 5\' 2"  (1.575 m)   Wt 276 lb 6.4 oz (125.4 kg)   SpO2 94%   BMI 50.55 kg/m  Physical Exam Constitutional:      General: She is not in acute distress.    Appearance: Normal appearance. She is not ill-appearing.  HENT:     Head: Normocephalic and atraumatic.     Mouth/Throat:     Mouth: Mucous membranes are moist.     Pharynx: Oropharynx is clear.  Cardiovascular:     Rate and Rhythm: Normal rate and regular rhythm.     Comments: Chronic BLE edema +1-2 Pulmonary:     Effort: Pulmonary effort is normal.     Breath sounds: No wheezing.     Comments: Wheezing with exertion; resolved with rest Skin:    General: Skin is warm and dry.  Neurological:     General: No focal deficit present.     Mental Status: She is alert and oriented to person, place, and time. Mental status is at baseline.  Psychiatric:        Mood and Affect: Mood normal.        Behavior: Behavior normal.        Thought  Content: Thought content normal.        Judgment: Judgment normal.      Lab Results:  CBC    Component Value Date/Time   WBC 5.8 02/21/2022 1157   RBC 5.10 02/21/2022 1157   HGB 14.1 02/21/2022 1157   HGB 14.3 01/20/2014 1303   HCT 45.5 02/21/2022 1157   HCT 42.9 01/20/2014 1303   PLT 166 02/21/2022 1157   PLT 160 01/20/2014 1303   MCV 89.2 02/21/2022 1157   MCV 89 01/20/2014 1303   MCH 27.6 02/21/2022 1157   MCHC 31.0 02/21/2022 1157   RDW 13.1 02/21/2022 1157   RDW 14.3 01/20/2014 1303   LYMPHSABS 1.1 02/21/2022 1157  MONOABS 0.5 02/21/2022 1157   EOSABS 0.1 02/21/2022 1157   BASOSABS 0.1 02/21/2022 1157    BMET    Component Value Date/Time   NA 141 02/21/2022 1157   NA 142 01/20/2014 1303   K 4.2 02/21/2022 1157   K 4.4 01/20/2014 1303   CL 106 02/21/2022 1157   CL 106 01/20/2014 1303   CO2 28 02/21/2022 1157   CO2 33 (H) 01/20/2014 1303   GLUCOSE 125 (H) 02/21/2022 1157   GLUCOSE 129 (H) 01/20/2014 1303   BUN 14 02/21/2022 1157   BUN 13 01/20/2014 1303   CREATININE 0.77 02/21/2022 1157   CREATININE 0.72 01/20/2014 1303   CALCIUM 9.5 02/21/2022 1157   CALCIUM 9.6 01/20/2014 1303   GFRNONAA >60 02/21/2022 1157   GFRNONAA >60 01/20/2014 1303   GFRAA >60 10/08/2019 1844   GFRAA >60 01/20/2014 1303    BNP    Component Value Date/Time   BNP 215.0 (H) 10/08/2019 1844    ProBNP    Component Value Date/Time   PROBNP 158.0 (H) 05/11/2020 1700    Imaging: No results found.   Assessment & Plan:   Eosinophilic bronchitis - Patient has chronic cough with associated exertional/nocturnal wheezing. She was intolerant to Arnuity (DPI) and Symbicort, both of which made her cough worse. Recommend trying nebulizer budesonide 0.5mg /64ml twice daily.   GERD (gastroesophageal reflux disease) - Patient has daily reflux/belching symptoms - Recommend resuming Protonix 40mg  daily   Chronic hypoxemic respiratory failure (HCC) - Continue 2L oxygen at  bedtime  Hx of sleep apnea - Hx moderate OSA, AHI 15/hour. She is intolerant to CPAP and not candidate for inspire device. Encourage side sleeping position and weight loss   Chronic diastolic heart failure (HCC) - Patient reports weight gain and leg swelling - She is compliant with lasix 20mg  daily - Checking BNP and BMET    , NP 09/03/2022

## 2022-09-03 NOTE — Assessment & Plan Note (Signed)
-   Patient reports weight gain and leg swelling - She is compliant with lasix 20mg  daily - Checking BNP and BMET

## 2022-09-03 NOTE — Assessment & Plan Note (Signed)
-   Continue 2L oxygen at bedtime  

## 2022-09-03 NOTE — Assessment & Plan Note (Addendum)
-   Patient has daily reflux/belching symptoms - Recommend resuming Protonix 40mg  daily

## 2022-09-03 NOTE — Progress Notes (Signed)
Can you let patient know her BNP was elevated, recommend she take additional 20mg  lasix x 3 days. If weight or swelling persist follow-up with cardiologist if she has one otherwise PCP is fine

## 2022-09-03 NOTE — Assessment & Plan Note (Signed)
-   Patient has chronic cough with associated exertional/nocturnal wheezing. She was intolerant to Arnuity (DPI) and Symbicort, both of which made her cough worse. Recommend trying nebulizer budesonide 0.5mg /39ml twice daily.

## 2022-09-03 NOTE — Assessment & Plan Note (Signed)
-   Hx moderate OSA, AHI 15/hour. She is intolerant to CPAP and not candidate for inspire device. Encourage side sleeping position and weight loss

## 2022-09-04 ENCOUNTER — Telehealth: Payer: Self-pay

## 2022-09-04 DIAGNOSIS — I471 Supraventricular tachycardia, unspecified: Secondary | ICD-10-CM

## 2022-09-04 MED ORDER — METOPROLOL TARTRATE 25 MG PO TABS: 37.5000 mg | ORAL_TABLET | Freq: Two times a day (BID) | ORAL | 3 refills | Status: DC

## 2022-09-04 NOTE — Telephone Encounter (Signed)
-----   Message from Sueanne Margarita, MD sent at 09/04/2022  4:54 PM EDT ----- Heart monitor showed several bursts of SVT lasting up to 48 seconds of time with fastest interval 139 beats.  There was one tracing where P waves were difficult call to see and cannot rule out a very short burst of PAF.  Increase Lopressor to 37.5 mg twice daily and repeat 2-week Zio patch in 2 months

## 2022-09-04 NOTE — Telephone Encounter (Signed)
The patient has been notified of the result and verbalized understanding.  All questions (if any) were answered. Antonieta Iba, RN 09/04/2022 4:59 PM

## 2022-09-11 ENCOUNTER — Telehealth: Payer: Self-pay | Admitting: Primary Care

## 2022-09-11 NOTE — Telephone Encounter (Signed)
Spoke to patient. She is requesting update on neb order.  I have provided her with [facility] contact number to call for update.  Nothing further needed.

## 2022-09-12 DIAGNOSIS — G4733 Obstructive sleep apnea (adult) (pediatric): Secondary | ICD-10-CM | POA: Diagnosis not present

## 2022-09-12 DIAGNOSIS — I5032 Chronic diastolic (congestive) heart failure: Secondary | ICD-10-CM | POA: Diagnosis not present

## 2022-09-13 NOTE — Telephone Encounter (Signed)
Routing to [place] for f/u.

## 2022-09-13 NOTE — Telephone Encounter (Signed)
Stefanie with  [facility] just wanted to make sure we didn't want the neb meds to come from them.  Neb meds were sent to Pharmacy

## 2022-09-17 ENCOUNTER — Other Ambulatory Visit: Payer: Self-pay | Admitting: Internal Medicine

## 2022-09-17 NOTE — Telephone Encounter (Signed)
Last filled 08-20-22 #120 Last OV 07-03-22 Next OV 01-07-23 [facility]

## 2022-09-26 ENCOUNTER — Encounter: Payer: Self-pay | Admitting: Cardiology

## 2022-09-26 ENCOUNTER — Ambulatory Visit: Payer: Medicare Other | Attending: Cardiology | Admitting: Cardiology

## 2022-09-26 VITALS — BP 150/70 | HR 65 | Ht 64.0 in | Wt 279.6 lb

## 2022-09-26 DIAGNOSIS — I10 Essential (primary) hypertension: Secondary | ICD-10-CM

## 2022-09-26 DIAGNOSIS — R0609 Other forms of dyspnea: Secondary | ICD-10-CM

## 2022-09-26 DIAGNOSIS — I471 Supraventricular tachycardia, unspecified: Secondary | ICD-10-CM

## 2022-09-26 MED ORDER — FUROSEMIDE 20 MG PO TABS: 40.0000 mg | ORAL_TABLET | Freq: Every day | ORAL | 3 refills | Status: DC

## 2022-09-26 NOTE — Progress Notes (Signed)
Cardiology Office Note:    Date:  09/26/2022   ID:  [name], DOB 02/19/1941, MRN [id number]  PCP:  Karie Schwalbe, MD  [facility] Cardiologist:  Debbe Odea, MD  [facility] Electrophysiologist:  None   Referring MD: Karie Schwalbe, MD   Chief Complaint  Patient presents with   Follow-up    SVT/HTN, SOB    History of Present Illness:    Autumn Price is a 81 y.o. female with a hx of paroxysmal SVT hypertension, obesity, OSA who presents for follow-up.    Previously seen for shortness of breath with exertion, cardiac work-up with echo and Myoview unrevealing.  Symptoms attributed to morbid obesity and sleep apnea.  Followed up with sleep specialist, intermittent paroxysmal SVT noted during sleep study.  Amlodipine was stopped, Lopressor was started and titrated to 37.5 mg twice daily.  Blood pressures have been controlled at home with systolics in the 130s.  Has occasional leg edema, which is usually improved after she takes an additional Lasix dose.  Currently prescribed 20 mg daily.   Prior notes Echo 09/2019 EF 60 to 65%, diastolic function normal Lexiscan Myoview 10/2020 no evidence for ischemia, minimal coronary artery calcification noted father had an MI in his 97s.  Previously had lower extremity edema which is improved with Lasix. Nutritional services not covered by insurance,  Patient previously saw pulmonary medicine, had PFTs done and were more consistent with obesity/obesity hypoventilation. No evidence of obstruction or restriction noted.    Past Medical History:  Diagnosis Date   Depression    Hypertension    Osteoarthritis    Urge incontinence    Venous insufficiency     Past Surgical History:  Procedure Laterality Date   APPENDECTOMY  1966   DILATION AND CURETTAGE OF UTERUS  01/27/14   benign   JOINT REPLACEMENT     right knee   TOTAL KNEE ARTHROPLASTY Right 03/20/2018   Procedure: RIGHT TOTAL KNEE ARTHROPLASTY;  Surgeon:  Eugenia Mcalpine, MD;  Location: [facility];  Service: Orthopedics;  Laterality: Right;   TOTAL KNEE ARTHROPLASTY Left 09/11/2018   Procedure: LEFT TOTAL KNEE ARTHROPLASTY;  Surgeon: Eugenia Mcalpine, MD;  Location: [facility];  Service: Orthopedics;  Laterality: Left;   TUBAL LIGATION  1966    Current Medications: Current Meds  Medication Sig   acetaminophen (TYLENOL) 500 MG tablet Take 500 mg by mouth every 6 (six) hours as needed for mild pain or moderate pain.   albuterol (VENTOLIN HFA) 108 (90 Base) MCG/ACT inhaler INHALE 1 TO 2 PUFFS BY MOUTH INTO THE LUNGS EVERY 6 HOURS AS NEEDED FOR WHEEZING OR SHORTNESS OF BREATH   budesonide (PULMICORT) 0.5 MG/2ML nebulizer solution Take 2 mLs (0.5 mg total) by nebulization in the morning and at bedtime.   furosemide (LASIX) 20 MG tablet Take 2 tablets (40 mg total) by mouth daily.   ketoconazole (NIZORAL) 2 % cream APPLY 1 APPLICATION TOPICALLY TO GROIN AREA 2 TIMES A DAY AS NEEDED FOR IRRITATION   metoprolol tartrate (LOPRESSOR) 25 MG tablet Take 1.5 tablets (37.5 mg total) by mouth 2 (two) times daily.   mirabegron ER (MYRBETRIQ) 50 MG TB24 tablet TAKE 1 TABLET ONCE DAILY   montelukast (SINGULAIR) 10 MG tablet Take 1 tablet (10 mg total) by mouth at bedtime.   pantoprazole (PROTONIX) 40 MG tablet Take 1 tablet (40 mg total) by mouth daily.   sertraline (ZOLOFT) 50 MG tablet Take 1 tablet (50 mg total) by mouth daily.  traMADol (ULTRAM) 50 MG tablet Take 1-2 tablets (50-100 mg total) by mouth every 12 (twelve) hours as needed.   [DISCONTINUED] furosemide (LASIX) 20 MG tablet TAKE ONE TABLET BY MOUTH ONCE DAILY     Allergies:   Patient has no known allergies.   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    Comment: [employer]   Tobacco Use   Smoking status: Never   Smokeless tobacco: Never  Vaping Use   Vaping Use: Never used  Substance and Sexual Activity   Alcohol use: No   Drug use: No   Sexual activity: Not Currently  Other Topics Concern   Not on file  Social History Narrative   Has living will   Designated son Ronny Flurry and other children to make health care decisions for her   Would accept CPR/resuscitation   Hasn't considered tube feedings--would leave to son   Social Determinants of Health   Financial Resource Strain: Low Risk  (08/18/2021)   Overall Financial Resource Strain (CARDIA)    Difficulty of Paying Living Expenses: Not hard at all  Food Insecurity: No Food Insecurity (08/15/2022)   Hunger Vital Sign    Worried About Running Out of Food in the Last Year: Never true    Ran Out of Food in the Last Year: Never true  Transportation Needs: No Transportation Needs (08/15/2022)   PRAPARE - Administrator, Civil Service (Medical): No    Lack of Transportation (Non-Medical): No  Physical Activity: Insufficiently Active (08/18/2021)   Exercise Vital Sign    Days of Exercise per Week: 4 days    Minutes of Exercise per Session: 10 min  Stress: No Stress Concern Present (08/18/2021)   [employer] of Occupational Health - Occupational Stress Questionnaire    Feeling of Stress : Only a little  Social Connections: Moderately Isolated (08/18/2021)   Social Connection and Isolation Panel [NHANES]    Frequency of Communication with Friends and Family: More than three times a week    Frequency of Social Gatherings with Friends and Family: Never    Attends Religious Services: 1 to 4 times per year    Active Member of [employer] or Organizations: No    Attends Engineer, structural: Never    Marital Status: Divorced     Family History: The patient's family history includes Alcohol abuse in her brother; Heart disease in her brother, father, and sister. There is no history of Breast  cancer.  ROS:   Please see the history of present illness.     All other systems reviewed and are negative.  EKGs/Labs/Other Studies Reviewed:    The following studies were reviewed today:   EKG:  EKG is  ordered today.  The ekg ordered today demonstrates normal sinus rhythm, with sinus arrhythmia  Recent Labs: 02/21/2022: ALT 12 09/03/2022: B Natriuretic Peptide 257.3; BUN 21; Creatinine, Ser 0.96; Hemoglobin 13.4; Platelets 146; Potassium 4.5; Sodium 141  Recent Lipid Panel    Component Value Date/Time   CHOL 185 08/09/2016 1037   TRIG 116.0 08/09/2016 1037   HDL 59.10 08/09/2016 1037   CHOLHDL 3 08/09/2016 1037   VLDL 23.2 08/09/2016 1037   LDLCALC 102 (H) 08/09/2016 1037  Risk Assessment/Calculations:      Physical Exam:    VS:  BP (!) 150/70   Pulse 65   Ht 5\' 4"  (1.626 m)   Wt 279 lb 9.6 oz (126.8 kg)   SpO2 94% Comment: 2 liters PM  BMI 47.99 kg/m     Wt Readings from Last 3 Encounters:  09/26/22 279 lb 9.6 oz (126.8 kg)  09/03/22 276 lb 6.4 oz (125.4 kg)  08/16/22 274 lb 3.2 oz (124.4 kg)     GEN:  Well nourished, well developed in no acute distress HEENT: Normal NECK: No JVD; No carotid bruits CARDIAC: RRR, no murmurs, rubs, gallops RESPIRATORY:  Clear to auscultation without rales, wheezing or rhonchi  ABDOMEN: Soft, non-tender, distended MUSCULOSKELETAL: Leg edema/lymphedema noted SKIN: Warm and dry NEUROLOGIC:  Alert and oriented x 3 PSYCHIATRIC:  Normal affect   ASSESSMENT:    1. Paroxysmal SVT (supraventricular tachycardia)   2. Primary hypertension   3. Morbid obesity ([place])   4. Dyspnea on exertion    PLAN:    In order of problems listed above:  Paroxysmal SVT noted during sleep study.  Cardiac work-up with echo and Myoview unrevealing.  Echo 09/2019 showed normal systolic and diastolic function, EF 60 to 65%.  Lexiscan Myoview 10/2020 low risk study, no evidence for ischemia .  Metoprolol was started, continue Lopressor 37.5mg   twice daily. Hypertension, BP controlled at home.  Increase Lasix to 40 mg daily, continue Lopressor 37.5 mg twice daily.  Check BMP in 1 week Obesity, low-calorie diet, weight loss advised.   Shortness of breath likely from morbid obesity  Follow-up in 6 months.   Medication Adjustments/Labs and Tests Ordered: Current medicines are reviewed at length with the patient today.  Concerns regarding medicines are outlined above.  Orders Placed This Encounter  Procedures   Basic metabolic panel   EKG [id number]   Meds ordered this encounter  Medications   furosemide (LASIX) 20 MG tablet    Sig: Take 2 tablets (40 mg total) by mouth daily.    Dispense:  60 tablet    Refill:  3    Patient Instructions  Medication Instructions:   Your physician has recommended you make the following change in your medication:    INCREASE your Furosemide (Lasix) to 40 MG once a day.  *If you need a refill on your cardiac medications before your next appointment, please call your pharmacy*   Lab Work:  Your physician recommends that you return for lab work (BMP) in: [place]  - Please go to the [facility]. You will check in at the front desk to the right as you walk into the atrium. Valet Parking is offered if needed. - No appointment needed. You may go any day between 7 am and 6 pm.   If you have any lab test that is abnormal or we need to change your treatment, we will call you to review the results.     Follow-Up: At [facility], you and your health needs are our priority.  As part of our continuing mission to provide you with exceptional heart care, we have created designated Provider Care Teams.  These Care Teams include your primary Cardiologist (physician) and Advanced Practice Providers (APPs -  Physician Assistants and Nurse Practitioners) who all work together to provide you with the care you need, when you need it.  We recommend signing up for the patient portal called  "MyChart".  Sign up information is provided on this  After Visit Summary.  MyChart is used to connect with patients for Virtual Visits (Telemedicine).  Patients are able to view lab/test results, encounter notes, upcoming appointments, etc.  Non-urgent messages can be sent to your provider as well.   To learn more about what you can do with MyChart, go to [email or web address].    Your next appointment:   6 month(s)  The format for your next appointment:   In Person  Provider:   You may see Debbe Odea, MD or one of the following Advanced Practice Providers on your designated Care Team:   Nicolasa Ducking, NP Eula Listen, PA-C Cadence Fransico Michael, PA-C Charlsie Quest, NP    Other Instructions   Important Information About Sugar        Signed, Debbe Odea, MD  09/26/2022 12:59 PM    [facility]

## 2022-09-26 NOTE — Patient Instructions (Signed)
Medication Instructions:   Your physician has recommended you make the following change in your medication:    INCREASE your Furosemide (Lasix) to 40 MG once a day.  *If you need a refill on your cardiac medications before your next appointment, please call your pharmacy*   Lab Work:  Your physician recommends that you return for lab work (BMP) in: [place]  - Please go to the [facility]. You will check in at the front desk to the right as you walk into the atrium. Valet Parking is offered if needed. - No appointment needed. You may go any day between 7 am and 6 pm.   If you have any lab test that is abnormal or we need to change your treatment, we will call you to review the results.     Follow-Up: At [facility], you and your health needs are our priority.  As part of our continuing mission to provide you with exceptional heart care, we have created designated Provider Care Teams.  These Care Teams include your primary Cardiologist (physician) and Advanced Practice Providers (APPs -  Physician Assistants and Nurse Practitioners) who all work together to provide you with the care you need, when you need it.  We recommend signing up for the patient portal called "MyChart".  Sign up information is provided on this After Visit Summary.  MyChart is used to connect with patients for Virtual Visits (Telemedicine).  Patients are able to view lab/test results, encounter notes, upcoming appointments, etc.  Non-urgent messages can be sent to your provider as well.   To learn more about what you can do with MyChart, go to [email or web address].    Your next appointment:   6 month(s)  The format for your next appointment:   In Person  Provider:   You may see Kate Sable, MD or one of the following Advanced Practice Providers on your designated Care Team:   Murray Hodgkins, NP Christell Faith, PA-C Cadence Kathlen Mody, PA-C Gerrie Nordmann, NP    Other  Instructions   Important Information About Sugar

## 2022-10-03 NOTE — Addendum Note (Signed)
Addended by: Anselm Pancoast on: 10/03/2022 03:09 PM   Modules accepted: Orders

## 2022-10-08 ENCOUNTER — Other Ambulatory Visit
Admission: RE | Admit: 2022-10-08 | Discharge: 2022-10-08 | Disposition: A | Discharge status: Home / Self Care | Payer: Medicare Other | Attending: Cardiology | Admitting: Cardiology

## 2022-10-08 DIAGNOSIS — I471 Supraventricular tachycardia, unspecified: Secondary | ICD-10-CM | POA: Diagnosis not present

## 2022-10-08 DIAGNOSIS — R0609 Other forms of dyspnea: Secondary | ICD-10-CM | POA: Diagnosis not present

## 2022-10-08 LAB — BASIC METABOLIC PANEL
Anion gap: 10 (ref 5–15)
BUN: 21 mg/dL (ref 8–23)
CO2: 26 mmol/L (ref 22–32)
Calcium: 9.7 mg/dL (ref 8.9–10.3)
Chloride: 101 mmol/L (ref 98–111)
Creatinine, Ser: 1.23 mg/dL — ABNORMAL HIGH (ref 0.44–1.00)
GFR, Estimated: 45 mL/min — ABNORMAL LOW (ref >60)
Glucose, Bld: 110 mg/dL — ABNORMAL HIGH (ref 70–99)
Potassium: 4.6 mmol/L (ref 3.5–5.1)
Sodium: 137 mmol/L (ref 135–145)

## 2022-10-11 ENCOUNTER — Telehealth: Payer: Self-pay

## 2022-10-11 ENCOUNTER — Other Ambulatory Visit: Payer: Self-pay | Admitting: Internal Medicine

## 2022-10-11 DIAGNOSIS — I5032 Chronic diastolic (congestive) heart failure: Secondary | ICD-10-CM

## 2022-10-11 MED ORDER — FUROSEMIDE 20 MG PO TABS: 20.0000 mg | ORAL_TABLET | Freq: Every day | ORAL | 3 refills | Status: DC

## 2022-10-11 NOTE — Telephone Encounter (Signed)
The patient has been notified of the result and verbalized understanding.  All questions (if any) were answered. Kavin Leech, RN 10/11/2022 10:15 AM

## 2022-10-11 NOTE — Telephone Encounter (Signed)
-----   Message from Kate Sable, MD sent at 10/10/2022  4:01 PM EDT ----- Creatinine slightly elevated, reduce Lasix to 20 mg daily, repeat BMP in 1 week.

## 2022-10-12 DIAGNOSIS — G4733 Obstructive sleep apnea (adult) (pediatric): Secondary | ICD-10-CM | POA: Diagnosis not present

## 2022-10-12 DIAGNOSIS — I5032 Chronic diastolic (congestive) heart failure: Secondary | ICD-10-CM | POA: Diagnosis not present

## 2022-10-14 ENCOUNTER — Other Ambulatory Visit (INDEPENDENT_AMBULATORY_CARE_PROVIDER_SITE_OTHER): Payer: Self-pay | Admitting: Nurse Practitioner

## 2022-10-14 DIAGNOSIS — M7989 Other specified soft tissue disorders: Secondary | ICD-10-CM

## 2022-10-14 NOTE — Telephone Encounter (Signed)
Last filled 09-17-22 #120 Last OV 07-03-22 Next OV 01-07-23 [facility]

## 2022-10-15 ENCOUNTER — Ambulatory Visit (INDEPENDENT_AMBULATORY_CARE_PROVIDER_SITE_OTHER): Payer: Medicare Other | Admitting: Nurse Practitioner

## 2022-10-15 ENCOUNTER — Encounter (INDEPENDENT_AMBULATORY_CARE_PROVIDER_SITE_OTHER): Payer: Self-pay | Admitting: Nurse Practitioner

## 2022-10-15 ENCOUNTER — Ambulatory Visit (INDEPENDENT_AMBULATORY_CARE_PROVIDER_SITE_OTHER): Payer: Medicare Other

## 2022-10-15 VITALS — BP 162/71 | HR 65 | Resp 17 | Ht 64.0 in | Wt 270.8 lb

## 2022-10-15 DIAGNOSIS — M7989 Other specified soft tissue disorders: Secondary | ICD-10-CM

## 2022-10-15 DIAGNOSIS — I5032 Chronic diastolic (congestive) heart failure: Secondary | ICD-10-CM

## 2022-10-15 DIAGNOSIS — I10 Essential (primary) hypertension: Secondary | ICD-10-CM | POA: Diagnosis not present

## 2022-10-16 ENCOUNTER — Encounter (INDEPENDENT_AMBULATORY_CARE_PROVIDER_SITE_OTHER): Payer: Self-pay | Admitting: Nurse Practitioner

## 2022-10-16 NOTE — Progress Notes (Signed)
Subjective:    Patient ID: Autumn Price, female    DOB: 02-Oct-1941, 81 y.o.   MRN: [id number] No chief complaint on file.   Autumn Price is a 81 year old female that presents today as a referral from Dr. Garen Lah in regards to her lower extremity edema.  She notes that this was going on for about 5 years.  She notes that it is worse after knee surgery.  The patient notes that the swelling is primarily from her hips and buttocks area down to slightly below her knees.  She states today she feels pins and needles along the outer aspect of her left thigh. She states she feels its nerve pain related to her sciatica. She notes that the feet and ankles typically do not have much issues with swelling.  She denies any open wounds weeping or ulceration.  Vascular Ultrasounds of bilateral lower extremities with reflux studies were completed today. The patient does not have any reflux in either extremity.   I believe the pins and needles she describes to her left lateral thigh are neurologic and possibly a neuropathy related to her sciatica.   She is very concerned about her weigh that has increased by 30 plus pounds over the past 3 months. She states she realizes this contributes to the pain in her legs and hips. She expressed she would like to try to get some of the new weight loss medications on the market prescribed for her. I directed her back to her PCP for a discussion related to her current status and the use of these medications.     Review of Systems  Constitutional:  Positive for unexpected weight change.  HENT: Negative.    Respiratory:  Positive for shortness of breath.   Cardiovascular: Negative.   Gastrointestinal: Negative.   Genitourinary: Negative.   Skin: Negative.   Neurological: Negative.   Psychiatric/Behavioral: Negative.         Objective:   Physical Exam Constitutional:      Appearance: Normal appearance. She is obese.  Eyes:     Pupils: Pupils are equal, round,  and reactive to light.  Cardiovascular:     Rate and Rhythm: Normal rate and regular rhythm.     Pulses: Normal pulses.  Pulmonary:     Effort: Pulmonary effort is normal.     Breath sounds: Examination of the right-upper field reveals wheezing. Examination of the left-upper field reveals wheezing. Examination of the right-middle field reveals wheezing. Examination of the left-middle field reveals wheezing. Wheezing present.     Comments: Patient breathes without extra effort or wheezing while sitting sitting. Exercise induced shortness of breath.when ambulating to and from the car or around the examination room.  Abdominal:     General: Bowel sounds are normal.     Palpations: Abdomen is soft.  Musculoskeletal:     Cervical back: Normal range of motion.     Right lower leg: 1+ Edema present.     Left lower leg: 1+ Edema present.  Skin:    General: Skin is warm and dry.     Capillary Refill: Capillary refill takes less than 2 seconds.  Neurological:     General: No focal deficit present.     Mental Status: She is alert and oriented to person, place, and time.  Psychiatric:        Mood and Affect: Mood normal.        Behavior: Behavior normal.        Judgment: Judgment  normal.     BP (!) 162/71 (BP Location: Right Arm)   Pulse 65   Resp 17   Ht 5\' 4"  (1.626 m)   Wt 270 lb 12.8 oz (122.8 kg)   BMI 46.48 kg/m   Past Medical History:  Diagnosis Date   Depression    Hypertension    Osteoarthritis    Urge incontinence    Venous insufficiency     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    Comment: [employer]  Tobacco Use   Smoking status: Never   Smokeless tobacco: Never  Vaping Use   Vaping Use: Never used  Substance and Sexual Activity    Alcohol use: No   Drug use: No   Sexual activity: Not Currently  Other Topics Concern   Not on file  Social History Narrative   Has living will   Designated son Alcide Goodness and other children to make health care decisions for her   Would accept CPR/resuscitation   Hasn't considered tube feedings--would leave to son   Social Determinants of Health   Financial Resource Strain: Low Risk  (08/18/2021)   Overall Financial Resource Strain (CARDIA)    Difficulty of Paying Living Expenses: Not hard at all  Food Insecurity: No Food Insecurity (08/15/2022)   Hunger Vital Sign    Worried About Running Out of Food in the Last Year: Never true    Ran Out of Food in the Last Year: Never true  Transportation Needs: No Transportation Needs (08/15/2022)   PRAPARE - Hydrologist (Medical): No    Lack of Transportation (Non-Medical): No  Physical Activity: Insufficiently Active (08/18/2021)   Exercise Vital Sign    Days of Exercise per Week: 4 days    Minutes of Exercise per Session: 10 min  Stress: No Stress Concern Present (08/18/2021)   [place]    Feeling of Stress : Only a little  Social Connections: Moderately Isolated (08/18/2021)   Social Connection and Isolation Panel [NHANES]    Frequency of Communication with Friends and Family: More than three times a week    Frequency of Social Gatherings with Friends and Family: Never    Attends Religious Services: 1 to 4 times per year    Active Member of [employer] or Organizations: No    Attends Archivist Meetings: Never    Marital Status: Divorced  Human resources officer Violence: Not At Risk (08/18/2021)   Humiliation, Afraid, Rape, and Kick questionnaire    Fear of Current or Ex-Partner: No    Emotionally Abused: No    Physically Abused: No    Sexually Abused: No    Past Surgical History:  Procedure Laterality Date   [place] OF UTERUS  01/27/14   benign   JOINT REPLACEMENT     right knee   TOTAL KNEE ARTHROPLASTY Right 03/20/2018   Procedure: RIGHT TOTAL KNEE ARTHROPLASTY;  Surgeon: Sydnee Cabal, MD;  Location: [facility];  Service: Orthopedics;  Laterality: Right;   TOTAL KNEE ARTHROPLASTY Left 09/11/2018   Procedure: LEFT TOTAL KNEE ARTHROPLASTY;  Surgeon: Sydnee Cabal, MD;  Location: [facility];  Service: Orthopedics;  Laterality: Left;   TUBAL LIGATION  1966    Family History  Problem Relation Age of Onset   Heart disease Father        died age 39- MI   Alcohol abuse Brother    Heart disease Brother    Heart disease Sister    Breast cancer Neg Hx     No Known Allergies     Latest Ref Rng & Units 09/03/2022   11:33 AM 02/21/2022   11:57 AM 01/02/2022   12:57 PM  CBC  WBC 4.0 - 10.5 K/uL 4.9  5.8  5.1   Hemoglobin 12.0 - 15.0 g/dL 13.4  14.1  14.7   Hematocrit 36.0 - 46.0 % 42.9  45.5  45.6   Platelets 150 - 400 K/uL 146  166  138.0       CMP     Component Value Date/Time   NA 137 10/08/2022 1148   NA 142 01/20/2014 1303   K 4.6 10/08/2022 1148   K 4.4 01/20/2014 1303   CL 101 10/08/2022 1148   CL 106 01/20/2014 1303   CO2 26 10/08/2022 1148   CO2 33 (H) 01/20/2014 1303   GLUCOSE 110 (H) 10/08/2022 1148   GLUCOSE 129 (H) 01/20/2014 1303   BUN 21 10/08/2022 1148   BUN 13 01/20/2014 1303   CREATININE 1.23 (H) 10/08/2022 1148   CREATININE 0.72 01/20/2014 1303   CALCIUM 9.7 10/08/2022 1148   CALCIUM 9.6 01/20/2014 1303   PROT 8.0 02/21/2022 1157   PROT 7.4 01/20/2014 1303   ALBUMIN 4.3 02/21/2022 1157   ALBUMIN 3.9 01/20/2014 1303   AST 21 02/21/2022 1157   AST 13 (L) 01/20/2014 1303   ALT 12 02/21/2022 1157   ALT 21 01/20/2014 1303   ALKPHOS 88 02/21/2022 1157   ALKPHOS 95 01/20/2014 1303   BILITOT 0.8 02/21/2022 1157   BILITOT 0.5 01/20/2014 1303   GFRNONAA 45 (L) 10/08/2022 1148   GFRNONAA >60 01/20/2014 1303   GFRAA >60 10/08/2019 1844   GFRAA >60 01/20/2014  1303     No results found.     Assessment & Plan:   1. Leg swelling Recommend:  I have had a long discussion with the patient regarding swelling and why it  causes symptoms.  I asked the patient to wear graduated compression on a daily basis a prescription was given. The patient will  wear the stockings first thing in the morning and removing them in the evening. The patient is instructed specifically not to sleep in the stockings. Due to her morbid obesity in her hips and lower extremities she was not comfortable in doing this but she would keep it in mind.   In addition, behavioral modification will be initiated.  This will include frequent elevation, use of over the counter pain medications and exercise such as walking to the best of her ability in her current condition. We discussed in detail weight loss and how this may effect any leg swelling and her need to lose weigh for her overall health. She asked about the new medications for weight loss. I directed her to see her PCP and have this discussion to determine if she would be a candidate in her current condition.    Patient completed vascular ultrasound of the venous system to ensure that DVT or reflux is not present. She does not have either today. Conventional therapy as stated above for possible lymphedema which is difficult to ascertain due to her morbid obesity of her  hips and lower extremities.   The patient can follow up PRN   2. Chronic diastolic heart failure (HCC) Continue cardiac and antihypertensive medications as already ordered and reviewed, no changes at this time.  Continue statin as ordered and reviewed, no changes at this time  Nitrates PRN for chest pain   3. Essential hypertension, benign Continue antihypertensive medications as already ordered, these medications have been reviewed and there are no changes at this time.    Current Outpatient Medications on File Prior to Visit  Medication Sig Dispense Refill    acetaminophen (TYLENOL) 500 MG tablet Take 500 mg by mouth every 6 (six) hours as needed for mild pain or moderate pain.     albuterol (VENTOLIN HFA) 108 (90 Base) MCG/ACT inhaler INHALE 1 TO 2 PUFFS BY MOUTH INTO THE LUNGS EVERY 6 HOURS AS NEEDED FOR WHEEZING OR SHORTNESS OF BREATH 8.5 g 0   budesonide (PULMICORT) 0.5 MG/2ML nebulizer solution Take 2 mLs (0.5 mg total) by nebulization in the morning and at bedtime. 120 mL 5   furosemide (LASIX) 20 MG tablet Take 1 tablet (20 mg total) by mouth daily. 60 tablet 3   ketoconazole (NIZORAL) 2 % cream APPLY 1 APPLICATION TOPICALLY TO GROIN AREA 2 TIMES A DAY AS NEEDED FOR IRRITATION 30 g 3   metoprolol tartrate (LOPRESSOR) 25 MG tablet Take 1.5 tablets (37.5 mg total) by mouth 2 (two) times daily. 270 tablet 3   mirabegron ER (MYRBETRIQ) 50 MG TB24 tablet TAKE 1 TABLET ONCE DAILY 90 tablet 3   montelukast (SINGULAIR) 10 MG tablet Take 1 tablet (10 mg total) by mouth at bedtime. 30 tablet 11   pantoprazole (PROTONIX) 40 MG tablet Take 1 tablet (40 mg total) by mouth daily. 30 tablet 3   sertraline (ZOLOFT) 50 MG tablet Take 1 tablet (50 mg total) by mouth daily. 90 tablet 3   traMADol (ULTRAM) 50 MG tablet Take 1-2 tablets (50-100 mg total) by mouth every 12 (twelve) hours as needed. 120 tablet 0   No current facility-administered medications on file prior to visit.    There are no Patient Instructions on file for this visit. No follow-ups on file.   Kris Hartmann, NP

## 2022-10-21 ENCOUNTER — Other Ambulatory Visit
Admission: RE | Admit: 2022-10-21 | Discharge: 2022-10-21 | Disposition: A | Discharge status: Home / Self Care | Payer: Medicare Other | Source: Ambulatory Visit | Attending: Cardiology | Admitting: Cardiology

## 2022-10-21 DIAGNOSIS — I5032 Chronic diastolic (congestive) heart failure: Secondary | ICD-10-CM | POA: Insufficient documentation

## 2022-10-21 LAB — BASIC METABOLIC PANEL
Anion gap: 8 (ref 5–15)
BUN: 24 mg/dL — ABNORMAL HIGH (ref 8–23)
CO2: 23 mmol/L (ref 22–32)
Calcium: 9.3 mg/dL (ref 8.9–10.3)
Chloride: 110 mmol/L (ref 98–111)
Creatinine, Ser: 1.19 mg/dL — ABNORMAL HIGH (ref 0.44–1.00)
GFR, Estimated: 47 mL/min — ABNORMAL LOW (ref >60)
Glucose, Bld: 126 mg/dL — ABNORMAL HIGH (ref 70–99)
Potassium: 3.9 mmol/L (ref 3.5–5.1)
Sodium: 141 mmol/L (ref 135–145)

## 2022-10-29 ENCOUNTER — Ambulatory Visit: Payer: Medicare Other | Attending: Cardiology

## 2022-10-29 DIAGNOSIS — I471 Supraventricular tachycardia, unspecified: Secondary | ICD-10-CM

## 2022-10-29 NOTE — Progress Notes (Unsigned)
Enrolled patient for a 14 day Zio XT  monitor to be mailed to patients home  °

## 2022-10-29 NOTE — Telephone Encounter (Signed)
Repeat 2 week zio patch has been ordered.

## 2022-10-29 NOTE — Addendum Note (Signed)
Addended by: Frutoso Schatz on: 10/29/2022 08:26 AM   Modules accepted: Orders

## 2022-11-04 ENCOUNTER — Ambulatory Visit: Payer: Medicare Other | Admitting: Primary Care

## 2022-11-04 ENCOUNTER — Ambulatory Visit: Payer: Medicare Other | Admitting: Pulmonary Disease

## 2022-11-11 ENCOUNTER — Encounter: Payer: Self-pay | Admitting: Primary Care

## 2022-11-11 ENCOUNTER — Ambulatory Visit (INDEPENDENT_AMBULATORY_CARE_PROVIDER_SITE_OTHER): Payer: Medicare Other | Admitting: Primary Care

## 2022-11-11 ENCOUNTER — Other Ambulatory Visit: Payer: Self-pay | Admitting: Internal Medicine

## 2022-11-11 VITALS — BP 132/84 | HR 73 | Temp 97.9°F | Ht 64.0 in | Wt 278.0 lb

## 2022-11-11 DIAGNOSIS — J40 Bronchitis, not specified as acute or chronic: Secondary | ICD-10-CM

## 2022-11-11 DIAGNOSIS — I5032 Chronic diastolic (congestive) heart failure: Secondary | ICD-10-CM | POA: Diagnosis not present

## 2022-11-11 DIAGNOSIS — J9611 Chronic respiratory failure with hypoxia: Secondary | ICD-10-CM | POA: Diagnosis not present

## 2022-11-11 DIAGNOSIS — D7218 Eosinophilia in diseases classified elsewhere: Secondary | ICD-10-CM | POA: Diagnosis not present

## 2022-11-11 LAB — NITRIC OXIDE: Nitric Oxide: 33

## 2022-11-11 MED ORDER — FLUTICASONE PROPIONATE 50 MCG/ACT NA SUSP: 1.0000 | Freq: Every day | NASAL | 1 refills | Status: DC | PRN

## 2022-11-11 MED ORDER — DOXYCYCLINE HYCLATE 100 MG PO TABS: 100.0000 mg | ORAL_TABLET | Freq: Two times a day (BID) | ORAL | 0 refills | Status: DC

## 2022-11-11 MED ORDER — BUDESONIDE 0.5 MG/2ML IN SUSP: 0.5000 mg | Freq: Two times a day (BID) | RESPIRATORY_TRACT | 5 refills | Status: DC

## 2022-11-11 MED ORDER — PREDNISONE 10 MG PO TABS: ORAL_TABLET | ORAL | 0 refills | Status: DC

## 2022-11-11 NOTE — Telephone Encounter (Signed)
Last filled 10-14-22 #120 Last OV 07-03-22 Next OV 01-07-23 [facility]

## 2022-11-11 NOTE — Patient Instructions (Addendum)
Recommendations Continue Pulmicort nebulizer morning and evening Continue Montelukast (Singulair) 10 mg at bedtime Start flonase nasal spray for nasal congestion x 2 weeks; then as needed for rhinitis symptoms Take prednisone taper and antibiotic as prescribed, if wheezing and congestion do not improve after completing   Rx Refill of Pulmicort sent to pharmacy Prednisone taper and antibiotic as directed  Follow-up: 3 months with Dr. Craige Cotta

## 2022-11-11 NOTE — Assessment & Plan Note (Addendum)
-   Acute exacerbation; Patient has noticed increased wheezing x 2 weeks with associated productive cough/PND. FENO today was 33. Sending in RX prednisone taper and Doxycyline 100mg  BID x 7 days. Advised she start fluticasone nasal spray. Continue Pulmicort 0.5mg /2ML nebulizer twice daily. Refills sent. Patient will notify office if wheezing does not improve after completing steroid course.

## 2022-11-11 NOTE — Progress Notes (Signed)
@Patient  ID: , female    DOB: [birth year], 81 y.o.   MRN: [id number]  Chief Complaint  Patient presents with   Follow-up    SOB with exertion, wheezing and prod cough with yellow mucus x2w.    Referring provider: [id number], MD  HPI: 81 year old female, never smoked.  Past medical history significant for eosinophilic bronchitis, chronic hypoxemic respiratory failure, history of sleep apnea intolerant to CPAP, chronic diastolic heart failure. Patient of Dr. 76, last seen by pulmonary nurse practitioner on 09/03/2022.  Previous [facility] pulmonary encounter: 9/19/202 Patient presents today for 3 month follow-up. She is doing well overall but reports daily symptoms of wheezing which is worse at night. Symbicort made her cough worse so she quit taking it. Over the weekend she developed a productive cough with light yellow mucus. She was intolerant to Arnuity and Symbicort. CXR in June 2023 showed no active cardiopulmonary disease, small transverse linear densities left lower lung suggest scarring. She was intolerant to CPAP, not candidate for inspire device . She is wearing 2L oxygen at night. She has noticed some weight gain and leg swelling. She takes lasix 20mg  daily, did not take today d/t appointment.   Eosinophilic bronchitis - Patient has chronic cough with associated exertional/nocturnal wheezing. She was intolerant to Arnuity (DPI) and Symbicort, both of which made her cough worse. Recommend trying nebulizer budesonide 0.5mg /64ml twice daily.    GERD (gastroesophageal reflux disease) - Patient has daily reflux/belching symptoms - Recommend resuming Protonix 40mg  daily    Chronic hypoxemic respiratory failure (HCC) - Continue 2L oxygen at bedtime   Hx of sleep apnea - Hx moderate OSA, AHI 15/hour. She is intolerant to CPAP and not candidate for inspire device. Encourage side sleeping position and weight loss    Chronic diastolic heart failure (HCC) - Patient reports  weight gain and leg swelling - She is compliant with lasix 20mg  daily - Checking BNP and BMET    11/11/2022 - Interim hx  Patient presents today for acute OV.  She has had wheezing symptoms x 2 weeks. Associated cough and post nasal drip. She has been using Pulmicort  0.5/2ML nebulizer twice daily and taking Singulair 10mg  at bedtime as prescribed. Needs refills of her medication. Her weight is stable from September. She is on lasix 20mg  daily.     No Known Allergies  Immunization History  Administered Date(s) Administered   Fluad Quad(high Dose 65+) 10/11/2020   Influenza Inj Mdck Quad Pf 10/30/2018, 09/25/2019   Influenza, Seasonal, Injecte, Preservative Fre 09/30/2016   Influenza,inj,Quad PF,6+ Mos 09/12/2017   Influenza-Unspecified 09/12/2021, 09/19/2022   Pneumococcal Conjugate-13 06/10/2014   Pneumococcal Polysaccharide-23 01/16/2009, 08/12/2017   Td 08/21/2010   Zoster Recombinat (Shingrix) 09/30/2019, 01/11/2020   Zoster, Live 09/18/2016    Past Medical History:  Diagnosis Date   Depression    Hypertension    Osteoarthritis    Urge incontinence    Venous insufficiency     Tobacco History: Social History   Tobacco Use  Smoking Status Never  Smokeless Tobacco Never   Counseling given: Not Answered   Outpatient Medications Prior to Visit  Medication Sig Dispense Refill   acetaminophen (TYLENOL) 500 MG tablet Take 500 mg by mouth every 6 (six) hours as needed for mild pain or moderate pain.     albuterol (VENTOLIN HFA) 108 (90 Base) MCG/ACT inhaler INHALE 1 TO 2 PUFFS BY MOUTH INTO THE LUNGS EVERY 6 HOURS AS NEEDED FOR WHEEZING OR SHORTNESS OF BREATH 8.5  g 0   furosemide (LASIX) 20 MG tablet Take 1 tablet (20 mg total) by mouth daily. 60 tablet 3   ketoconazole (NIZORAL) 2 % cream APPLY 1 APPLICATION TOPICALLY TO GROIN AREA 2 TIMES A DAY AS NEEDED FOR IRRITATION 30 g 3   metoprolol tartrate (LOPRESSOR) 25 MG tablet Take 1.5 tablets (37.5 mg total) by mouth 2  (two) times daily. 270 tablet 3   mirabegron ER (MYRBETRIQ) 50 MG TB24 tablet TAKE 1 TABLET ONCE DAILY 90 tablet 3   montelukast (SINGULAIR) 10 MG tablet Take 1 tablet (10 mg total) by mouth at bedtime. 30 tablet 11   pantoprazole (PROTONIX) 40 MG tablet Take 1 tablet (40 mg total) by mouth daily. 30 tablet 3   sertraline (ZOLOFT) 50 MG tablet Take 1 tablet (50 mg total) by mouth daily. 90 tablet 3   budesonide (PULMICORT) 0.5 MG/2ML nebulizer solution Take 2 mLs (0.5 mg total) by nebulization in the morning and at bedtime. 120 mL 5   traMADol (ULTRAM) 50 MG tablet Take 1-2 tablets (50-100 mg total) by mouth every 12 (twelve) hours as needed. 120 tablet 0   No facility-administered medications prior to visit.   Review of Systems  Review of Systems  Constitutional: Negative.   HENT:  Positive for postnasal drip.   Respiratory:  Positive for cough and wheezing.      Physical Exam  BP 132/84 (BP Location: Left Arm, Cuff Size: Large)   Pulse 73   Temp 97.9 F (36.6 C) (Temporal)   Ht 5\' 4"  (1.626 m)   Wt 278 lb (126.1 kg)   SpO2 93%   BMI 47.72 kg/m  Physical Exam Constitutional:      Appearance: Normal appearance.  HENT:     Head: Normocephalic and atraumatic.  Cardiovascular:     Rate and Rhythm: Normal rate and regular rhythm.     Comments: Trace edema Pulmonary:     Effort: Pulmonary effort is normal.     Breath sounds: Normal breath sounds. No rhonchi or rales.     Comments: Wheezing with exertion Neurological:     Mental Status: She is alert.  Psychiatric:        Mood and Affect: Mood normal.        Behavior: Behavior normal.        Thought Content: Thought content normal.        Judgment: Judgment normal.      Lab Results:  CBC    Component Value Date/Time   WBC 4.9 09/03/2022 1133   RBC 4.77 09/03/2022 1133   HGB 13.4 09/03/2022 1133   HGB 14.3 01/20/2014 1303   HCT 42.9 09/03/2022 1133   HCT 42.9 01/20/2014 1303   PLT 146 (L) 09/03/2022 1133   PLT  160 01/20/2014 1303   MCV 89.9 09/03/2022 1133   MCV 89 01/20/2014 1303   MCH 28.1 09/03/2022 1133   MCHC 31.2 09/03/2022 1133   RDW 13.4 09/03/2022 1133   RDW 14.3 01/20/2014 1303   LYMPHSABS 1.0 09/03/2022 1133   MONOABS 0.5 09/03/2022 1133   EOSABS 0.2 09/03/2022 1133   BASOSABS 0.1 09/03/2022 1133    BMET    Component Value Date/Time   NA 141 10/21/2022 1124   NA 142 01/20/2014 1303   K 3.9 10/21/2022 1124   K 4.4 01/20/2014 1303   CL 110 10/21/2022 1124   CL 106 01/20/2014 1303   CO2 23 10/21/2022 1124   CO2 33 (H) 01/20/2014 1303   GLUCOSE  126 (H) 10/21/2022 1124   GLUCOSE 129 (H) 01/20/2014 1303   BUN 24 (H) 10/21/2022 1124   BUN 13 01/20/2014 1303   CREATININE 1.19 (H) 10/21/2022 1124   CREATININE 0.72 01/20/2014 1303   CALCIUM 9.3 10/21/2022 1124   CALCIUM 9.6 01/20/2014 1303   GFRNONAA 47 (L) 10/21/2022 1124   GFRNONAA >60 01/20/2014 1303   GFRAA >60 10/08/2019 1844   GFRAA >60 01/20/2014 1303    BNP    Component Value Date/Time   BNP 257.3 (H) 09/03/2022 1136    ProBNP    Component Value Date/Time   PROBNP 158.0 (H) 05/11/2020 1700    Imaging: No results found.   Assessment & Plan:   Eosinophilic bronchitis - Acute exacerbation; Patient has noticed increased wheezing x 2 weeks with associated productive cough/PND. FENO today was 33. Sending in RX prednisone taper and Doxycyline 100mg  BID x 7 days. Advised she start fluticasone nasal spray. Continue Pulmicort 0.5mg /2ML nebulizer twice daily. Refills sent. Patient will notify office if wheezing does not improve after completing steroid course.   Chronic hypoxemic respiratory failure (HCC) - Continue 2L oxygen at bedtime  Chronic diastolic heart failure (HCC) - Weight has been stable - Continue lasix 20mg  daily    , NP 12/16/2022

## 2022-11-12 DIAGNOSIS — G4733 Obstructive sleep apnea (adult) (pediatric): Secondary | ICD-10-CM | POA: Diagnosis not present

## 2022-11-12 DIAGNOSIS — I5032 Chronic diastolic (congestive) heart failure: Secondary | ICD-10-CM | POA: Diagnosis not present

## 2022-11-14 ENCOUNTER — Ambulatory Visit: Payer: Medicare Other | Admitting: Cardiology

## 2022-11-25 ENCOUNTER — Telehealth: Payer: Self-pay | Admitting: Primary Care

## 2022-11-25 MED ORDER — PREDNISONE 10 MG PO TABS: ORAL_TABLET | ORAL | 0 refills | Status: DC

## 2022-11-25 NOTE — Telephone Encounter (Signed)
Pt called back. Did not want to miss your CB. Going out. Call grandsons phone @ [phone] if you miss her at home. Thanks.

## 2022-11-25 NOTE — Telephone Encounter (Signed)
Patient is aware of recommendations and voiced her understanding. Prednisone sent to preferred pharmacy.  Dr. Jayme Cloud, please advise if patient can be scheduled 12/03/2022 in nodule slot?

## 2022-11-25 NOTE — Telephone Encounter (Signed)
She is still using the nebulizer twice a day. Ok to send in the Prednisone?

## 2022-11-25 NOTE — Telephone Encounter (Signed)
Is she still using nebulized budesonide 0.5mg /56ml twice daily? We can send in another pred taper (40mg  x 3 days, 30mg  x 3 days, 20mg  x 3 days, 10mg  x 3 days). Have her set up visit follow up with Dr. or to discuss management- she has seen both in the past.  May want to add daily prednisone or consider biologics.

## 2022-11-25 NOTE — Telephone Encounter (Signed)
Noted, needs to make follow-up appointment.

## 2022-11-25 NOTE — Telephone Encounter (Signed)
I spoke with the patient. She said while she was on the Prednisone she felt great. No cough or wheezing. A couple of days after she finished it the cough and wheezing came. It is the same as before, no better or worse. She also is still coughing up yellow sputum like before. No fevers, sweats or chills.   Pharmacy- [facility]

## 2022-11-25 NOTE — Telephone Encounter (Signed)
Yes ok to send in prednisone taper

## 2022-11-27 ENCOUNTER — Encounter: Payer: Self-pay | Admitting: Pulmonary Disease

## 2022-11-27 ENCOUNTER — Ambulatory Visit (INDEPENDENT_AMBULATORY_CARE_PROVIDER_SITE_OTHER): Payer: Medicare Other | Admitting: Pulmonary Disease

## 2022-11-27 VITALS — BP 124/82 | HR 84 | Temp 97.6°F | Ht 64.0 in | Wt 276.0 lb

## 2022-11-27 DIAGNOSIS — J209 Acute bronchitis, unspecified: Secondary | ICD-10-CM

## 2022-11-27 DIAGNOSIS — R062 Wheezing: Secondary | ICD-10-CM

## 2022-11-27 DIAGNOSIS — J4541 Moderate persistent asthma with (acute) exacerbation: Secondary | ICD-10-CM | POA: Diagnosis not present

## 2022-11-27 LAB — NITRIC OXIDE: Nitric Oxide: 48

## 2022-11-27 MED ORDER — TRELEGY ELLIPTA 200-62.5-25 MCG/ACT IN AEPB: 1.0000 | INHALATION_SPRAY | Freq: Every day | RESPIRATORY_TRACT | 0 refills | Status: DC

## 2022-11-27 MED ORDER — TRELEGY ELLIPTA 200-62.5-25 MCG/ACT IN AEPB: 1.0000 | INHALATION_SPRAY | Freq: Every day | RESPIRATORY_TRACT | 11 refills | Status: DC

## 2022-11-27 MED ORDER — AZITHROMYCIN 250 MG PO TABS: ORAL_TABLET | ORAL | 0 refills | Status: AC

## 2022-11-27 NOTE — Progress Notes (Signed)
Subjective:    Patient ID: Autumn Price, female    DOB: Sep 23, 1941, 81 y.o.   MRN: [id number] Patient Care Team: Karie Schwalbe, MD as PCP - General Debbe Odea, MD as PCP - Cardiology (Cardiology) Quintella Reichert, MD as PCP - Sleep Medicine (Cardiology)  Chief Complaint  Patient presents with   Follow-up    SOB with with walking short distances. Wheezing bad last night. Sweats yesterday. No fevers.    HPI Autumn Price is a 81 year old lifelong never smoker who presents as an acute visit due to increasing shortness of breath and wheezing.  I have previously evaluated this patient last visit with me was on 14 February 2021.  In the interim she has been seen by Buelah Manis, NP and also by Dr. Coralyn Helling.  She had persistent issues with dry cough and it was postulated that she had eosinophilic bronchitis.  She cannot tolerate Symbicort and therefore has not been on any inhaler.  Prior PFTs were unremarkable with the exception of mild restriction likely on the basis of obesity.  At her last visit with Buelah Manis, NP she was asked to continue budesonide nebulized and subsequent phone call on 11 December resulted in calling in a prednisone taper.  Patient noted that while she was on the prednisone taper she did very well however, once the prednisone finished she started having issues with wheezing and shortness of breath again.  Last FeNO on 27 November was 33.  Patient presents today for follow-up of these issues.  She started prednisone yesterday.  Since then has noted that things are "starting to settle down.  She has noted cough productive of yellowish sputum as well.  No fevers, or chills.  Did have night sweats that she relates to prednisone.  She looks uncomfortable, she does not appear toxic.  She has not had any chest pain.  No lower extremity edema or calf tenderness.  She does not endorse any other symptomatology  DATA: 10/13/2019 2D echo: LVEF 60 to 65%, essentially normal, no pulmonary  hypertension 06/05/2020 overnight oximetry: Shows desaturations to 72% throughout the course of the night with a high oxygen desaturation index/sleep disturbance (24 events per hour) 06/27/2020 PFTs: FEV1 1.86 L or 93% predicted, FVC 2.28 L or 86% predicted.  FEV1/FVC 82%. No bronchodilator response.  Diffusion capacity not performed despite being ordered.  No obstructive defect.  No restrictive defect 10/09/2020 Home sleep study: Desaturations noted during study as low as 81%.  Average of 91%.  AHI was 15.5, consistent with moderate obstructive sleep apnea 10/17/2020 nuclear stress test: Low risk study, sensitivity reduced to the patient's body habitus, coronary artery calcifications noted on attenuation correction CT. 06/03/2022 chest x-ray PA and lateral: No active cardiopulmonary disease.  Review of Systems A 10 point review of systems was performed and it is as noted above otherwise negative.  Patient Active Problem List   Diagnosis Date Noted   GERD (gastroesophageal reflux disease) 09/03/2022   Hx of sleep apnea 09/03/2022   Eosinophilic bronchitis 06/14/2022   Thrombocytopenia (HCC) 01/03/2022   Chronic hypoxemic respiratory failure (HCC) 01/02/2022   Stage 3a chronic kidney disease (HCC) 01/02/2022   Chronic narcotic dependence (HCC) 01/02/2022   Persistent cough 05/11/2020   Chronic diastolic heart failure (HCC) 10/25/2019   DOE (dyspnea on exertion) 10/11/2019   Primary osteoarthritis of right knee 03/20/2018   S/P knee replacement 03/20/2018   Osteoarthritis of left knee 08/30/2016   Advance directive discussed with patient 08/04/2015  Mood disorder (HCC) 06/10/2014   Morbid obesity (HCC) 05/22/2012   Routine general medical examination at a health care facility 03/19/2011   Mixed stress and urge urinary incontinence 03/19/2011   Essential hypertension, benign 08/21/2010   Chronic venous insufficiency 08/21/2010   Osteoarthritis, multiple sites 08/21/2010   Social  History   Tobacco Use   Smoking status: Never   Smokeless tobacco: Never  Substance Use Topics   Alcohol use: No   No Known Allergies  Current Meds  Medication Sig   acetaminophen (TYLENOL) 500 MG tablet Take 500 mg by mouth every 6 (six) hours as needed for mild pain or moderate pain.   albuterol (VENTOLIN HFA) 108 (90 Base) MCG/ACT inhaler INHALE 1 TO 2 PUFFS BY MOUTH INTO THE LUNGS EVERY 6 HOURS AS NEEDED FOR WHEEZING OR SHORTNESS OF BREATH   budesonide (PULMICORT) 0.5 MG/2ML nebulizer solution Take 2 mLs (0.5 mg total) by nebulization in the morning and at bedtime.   doxycycline (VIBRA-TABS) 100 MG tablet Take 1 tablet (100 mg total) by mouth 2 (two) times daily.   fluticasone (FLONASE) 50 MCG/ACT nasal spray Place 1 spray into both nostrils daily as needed for allergies or rhinitis.   furosemide (LASIX) 20 MG tablet Take 1 tablet (20 mg total) by mouth daily.   ketoconazole (NIZORAL) 2 % cream APPLY 1 APPLICATION TOPICALLY TO GROIN AREA 2 TIMES A DAY AS NEEDED FOR IRRITATION   metoprolol tartrate (LOPRESSOR) 25 MG tablet Take 1.5 tablets (37.5 mg total) by mouth 2 (two) times daily.   mirabegron ER (MYRBETRIQ) 50 MG TB24 tablet TAKE 1 TABLET ONCE DAILY   montelukast (SINGULAIR) 10 MG tablet Take 1 tablet (10 mg total) by mouth at bedtime.   pantoprazole (PROTONIX) 40 MG tablet Take 1 tablet (40 mg total) by mouth daily.   predniSONE (DELTASONE) 10 MG tablet 40mg  x 3 days, 30mg  x 3 days, 20mg  x 3 days, 10mg  x 3 days   sertraline (ZOLOFT) 50 MG tablet Take 1 tablet (50 mg total) by mouth daily.   traMADol (ULTRAM) 50 MG tablet Take 1-2 tablets (50-100 mg total) by mouth every 12 (twelve) hours as needed.   Immunization History  Administered Date(s) Administered   Fluad Quad(high Dose 65+) 10/11/2020   Influenza Inj Mdck Quad Pf 10/30/2018, 09/25/2019   Influenza, Seasonal, Injecte, Preservative Fre 09/30/2016   Influenza,inj,Quad PF,6+ Mos 09/12/2017   Influenza-Unspecified  09/12/2021, 09/19/2022   Pneumococcal Conjugate-13 06/10/2014   Pneumococcal Polysaccharide-23 01/16/2009, 08/12/2017   Td 08/21/2010   Zoster Recombinat (Shingrix) 09/30/2019, 01/11/2020   Zoster, Live 09/18/2016       Objective:   Physical Exam BP 124/82 (BP Location: Left Arm, Cuff Size: Normal)   Pulse 84   Temp 97.6 F (36.4 C)   Ht 5\' 4"  (1.626 m)   Wt 276 lb (125.2 kg) Comment: per patient  SpO2 94%   BMI 47.38 kg/m  GENERAL: Obese woman, no acute distress, fully ambulatory, no conversational dyspnea.  Appears somewhat uncomfortable but not toxic appearing HEAD: Normocephalic, atraumatic.  EYES: Pupils equal, round, reactive to light.  No scleral icterus.  MOUTH: Dentition intact, oral mucosa moist. NECK: Supple. No thyromegaly. Trachea midline. No JVD.  No adenopathy. PULMONARY: Good air entry bilaterally.  Coarse breath sounds otherwise, no adventitious sounds. CARDIOVASCULAR: S1 and S2. Regular rate and rhythm.  No rubs, murmurs or gallops heard. ABDOMEN: Obese otherwise benign. MUSCULOSKELETAL: No joint deformity, no clubbing, no edema.  NEUROLOGIC: No focal deficit, no gait disturbance, speech is  fluent. SKIN: Intact,warm,dry.  Limited exam shows no rashes PSYCH: Mood appropriate, behavior normal.  Lab Results  Component Value Date   NITRICOXIDE 48 11/27/2022  Significant elevation given the patient is on steroids, type II inflammation highly suspected     Assessment & Plan:     ICD-10-CM   1. Moderate persistent asthma with (acute) exacerbation  J45.41    Continue prednisone taper Trelegy 200, 1 puff daily Continue albuterol as needed    2. Wheezing  R06.2 Nitric oxide   Nitric oxide 48 Suspect type II inflammation    3. Acute bronchitis, unspecified organism  J20.9    Azithromycin Z-Pak     Meds ordered this encounter  Medications   Fluticasone-Umeclidin-Vilant (TRELEGY ELLIPTA) 200-62.5-25 MCG/ACT AEPB    Sig: Inhale 1 puff into the lungs  daily.    Dispense:  14 each    Refill:  0    Order Specific Question:   Lot Number?    Answer:   [id number]    Order Specific Question:   Expiration Date?    Answer:   05/16/2024    Order Specific Question:   Quantity    Answer:   1   Fluticasone-Umeclidin-Vilant (TRELEGY ELLIPTA) 200-62.5-25 MCG/ACT AEPB    Sig: Inhale 1 puff into the lungs daily.    Dispense:  28 each    Refill:  11   azithromycin (ZITHROMAX) 250 MG tablet    Sig: Take 2 tablets (500 mg) on  Day 1,  followed by 1 tablet (250 mg) once daily on Days 2 through 5.    Dispense:  6 each    Refill:  0   We will see the patient in follow-up in 4 to 6 weeks time she is to contact us prior to that time should any new difficulties arise.   Gailen Shelter, MD Advanced Bronchoscopy PCCM [facility]    *This note was dictated using voice recognition software/Dragon.  Despite best efforts to proofread, errors can occur which can change the meaning. Any transcriptional errors that result from this process are unintentional and may not be fully corrected at the time of dictation.

## 2022-11-27 NOTE — Patient Instructions (Addendum)
We will send a prescription in for a Z-Pak.  Complete your prednisone taper.  We have started you on an inhaler called Trelegy Ellipta.  This is 1 puff daily.  Make sure you rinse your mouth well after you use it.  You may cough so after using it this is normal as the airways open this effect should not last long.  We have given you a coupon for your first prescription of the Trelegy so that it is covered.  We will see you in follow-up in 4 to 6 weeks time call sooner should any new problems arise.  You may see me or our nurse practitioner Buelah Manis.

## 2022-12-03 ENCOUNTER — Other Ambulatory Visit: Payer: Self-pay | Admitting: Primary Care

## 2022-12-11 ENCOUNTER — Other Ambulatory Visit: Payer: Self-pay | Admitting: Internal Medicine

## 2022-12-12 DIAGNOSIS — G4733 Obstructive sleep apnea (adult) (pediatric): Secondary | ICD-10-CM | POA: Diagnosis not present

## 2022-12-12 DIAGNOSIS — I5032 Chronic diastolic (congestive) heart failure: Secondary | ICD-10-CM | POA: Diagnosis not present

## 2022-12-13 NOTE — Telephone Encounter (Signed)
Patient called in to follow up on this request. She stated she is out of the medication and needs it. Please advise. Thank you!

## 2022-12-13 NOTE — Telephone Encounter (Signed)
Last filled 11-11-22 #120 Last OV 07-03-22 Next OV 01-07-23 [facility]

## 2022-12-16 NOTE — Assessment & Plan Note (Signed)
-   Weight has been stable - Continue lasix 20mg  daily

## 2022-12-16 NOTE — Assessment & Plan Note (Signed)
-   Continue 2L oxygen at bedtime  

## 2023-01-07 ENCOUNTER — Encounter: Payer: Self-pay | Admitting: Internal Medicine

## 2023-01-07 ENCOUNTER — Ambulatory Visit (INDEPENDENT_AMBULATORY_CARE_PROVIDER_SITE_OTHER): Payer: 59 | Admitting: Internal Medicine

## 2023-01-07 VITALS — BP 138/80 | HR 64 | Temp 97.7°F | Ht 63.5 in | Wt 278.0 lb

## 2023-01-07 DIAGNOSIS — I5032 Chronic diastolic (congestive) heart failure: Secondary | ICD-10-CM | POA: Diagnosis not present

## 2023-01-07 DIAGNOSIS — N1831 Chronic kidney disease, stage 3a: Secondary | ICD-10-CM

## 2023-01-07 DIAGNOSIS — J9611 Chronic respiratory failure with hypoxia: Secondary | ICD-10-CM | POA: Diagnosis not present

## 2023-01-07 DIAGNOSIS — D7218 Eosinophilia in diseases classified elsewhere: Secondary | ICD-10-CM | POA: Diagnosis not present

## 2023-01-07 DIAGNOSIS — F39 Unspecified mood [affective] disorder: Secondary | ICD-10-CM

## 2023-01-07 DIAGNOSIS — Z0000 Encounter for general adult medical examination without abnormal findings: Secondary | ICD-10-CM

## 2023-01-07 DIAGNOSIS — D696 Thrombocytopenia, unspecified: Secondary | ICD-10-CM | POA: Diagnosis not present

## 2023-01-07 DIAGNOSIS — J40 Bronchitis, not specified as acute or chronic: Secondary | ICD-10-CM | POA: Diagnosis not present

## 2023-01-07 MED ORDER — METOPROLOL TARTRATE 25 MG PO TABS: 25.0000 mg | ORAL_TABLET | Freq: Two times a day (BID) | ORAL | 0 refills | Status: DC

## 2023-01-07 NOTE — Assessment & Plan Note (Signed)
Seems compensated though severe multifactorial function issues Will cut the metoprolol back to 25 bid Furosemide 20 daily

## 2023-01-07 NOTE — Assessment & Plan Note (Signed)
On budesonide and trelegy

## 2023-01-07 NOTE — Assessment & Plan Note (Signed)
Needs to give up the sweet tea and try chair exercises

## 2023-01-07 NOTE — Assessment & Plan Note (Signed)
Mild low GFR No Rx for now Consider ARB--leave to cardiology

## 2023-01-07 NOTE — Assessment & Plan Note (Signed)
Having a hard time losing her dog Continues on sertraline 50 daily

## 2023-01-07 NOTE — Assessment & Plan Note (Signed)
I have personally reviewed the Medicare Annual Wellness questionnaire and have noted 1. The patient's medical and social history 2. Their use of alcohol, tobacco or illicit drugs 3. Their current medications and supplements 4. The patient's functional ability including ADL's, fall risks, home safety risks and hearing or visual             impairment. 5. Diet and physical activities 6. Evidence for depression or mood disorders  The patients weight, height, BMI and visual acuity have been recorded in the chart I have made referrals, counseling and provided education to the patient based review of the above and I have provided the pt with a written personalized care plan for preventive services.  I have provided you with a copy of your personalized plan for preventive services. Please take the time to review along with your updated medication list.  Done with cancer screening Doesn't want COVID vaccines Discussed at least chair exercise

## 2023-01-07 NOTE — Assessment & Plan Note (Signed)
Borderline low Easy bleeding but not pathologic

## 2023-01-07 NOTE — Assessment & Plan Note (Signed)
Oxygen at night and prn

## 2023-01-07 NOTE — Progress Notes (Signed)
Subjective:    Patient ID: Autumn Price, female    DOB: 1941-10-27, 82 y.o.   MRN: [id number]  HPI Here for Medicare wellness visit and follow up of chronic health conditions Reviewed advanced directives Reviewed other doctors---Dr [facility] doctor, Ms Owens Shark NP--vascular, Ms Volanda Napoleon NP--pulm, Dr Carollee Leitz Dr Kiser--dentist No hospitalizations or surgery this year Vision is okay Hearing is okay Can't exercise---tired/DOE just walking in house No falls--just tripped once but didn't go down Independent with instrumental ADLs No sig memory issues  Has been down in past couple of weeks Just lost her Joelyn Oms 15 years Not sure about getting another Mood had been okay till then--still on sertraline  Concerned about her weight BMI is 48 Wondered about phentermine---discussed that is not a good idea 2 glasses a day--discussed  Easy DOE---stable but severe Will discuss with cardiologist Diagnosed with lymphedema Does void a lot with furosemide daily--did have increase and GFR went down (baseline 47) No palpitations On metoprolol ---has had rate in high 40's. Didn't notice any difference No dizziness or syncope  Is on pulmicort nebs and trelegy For asthma Uses oxygen at night--and as needed  Does tend to bleed easy--like if she scratches her skin Bruises easy also Last platelet count 146K  Ongoing joint pains Uses the tramadol regularly---hands are the worst thing ----and left knee  Current Outpatient Medications on File Prior to Visit  Medication Sig Dispense Refill   acetaminophen (TYLENOL) 500 MG tablet Take 500 mg by mouth every 6 (six) hours as needed for mild pain or moderate pain.     albuterol (VENTOLIN HFA) 108 (90 Base) MCG/ACT inhaler INHALE 1 TO 2 PUFFS BY MOUTH INTO THE LUNGS EVERY 6 HOURS AS NEEDED FOR WHEEZING OR SHORTNESS OF BREATH 8.5 g 0   budesonide (PULMICORT) 0.5 MG/2ML nebulizer solution Take 2 mLs (0.5 mg total) by nebulization in the morning  and at bedtime. 120 mL 5   fluticasone (FLONASE) 50 MCG/ACT nasal spray PLACE 1 SPRAY INTO BOTH NOSTRILS DAILY AS NEEDED FOR ALLERGIES OR RHINITIS. 48 mL 1   Fluticasone-Umeclidin-Vilant (TRELEGY ELLIPTA) 200-62.5-25 MCG/ACT AEPB Inhale 1 puff into the lungs daily. 28 each 11   furosemide (LASIX) 20 MG tablet Take 1 tablet (20 mg total) by mouth daily. 60 tablet 3   ketoconazole (NIZORAL) 2 % cream APPLY 1 APPLICATION TOPICALLY TO GROIN AREA 2 TIMES A DAY AS NEEDED FOR IRRITATION 30 g 3   metoprolol tartrate (LOPRESSOR) 25 MG tablet Take 1.5 tablets (37.5 mg total) by mouth 2 (two) times daily. 270 tablet 3   mirabegron ER (MYRBETRIQ) 50 MG TB24 tablet TAKE 1 TABLET ONCE DAILY 90 tablet 3   montelukast (SINGULAIR) 10 MG tablet Take 1 tablet (10 mg total) by mouth at bedtime. 30 tablet 11   pantoprazole (PROTONIX) 40 MG tablet Take 1 tablet (40 mg total) by mouth daily. 30 tablet 3   sertraline (ZOLOFT) 50 MG tablet Take 1 tablet (50 mg total) by mouth daily. 90 tablet 3   traMADol (ULTRAM) 50 MG tablet Take 1-2 tablets (50-100 mg total) by mouth every 12 (twelve) hours as needed. 120 tablet 0   No current facility-administered medications on file prior to visit.    No Known Allergies  Past Medical History:  Diagnosis Date   Depression    Hypertension    Osteoarthritis    Urge incontinence    Venous insufficiency     Past Surgical History:  Procedure Laterality Date   APPENDECTOMY  1966  DILATION AND CURETTAGE OF UTERUS  01/27/14   benign   JOINT REPLACEMENT     right knee   TOTAL KNEE ARTHROPLASTY Right 03/20/2018   Procedure: RIGHT TOTAL KNEE ARTHROPLASTY;  Surgeon: Sydnee Cabal, MD;  Location: [facility];  Service: Orthopedics;  Laterality: Right;   TOTAL KNEE ARTHROPLASTY Left 09/11/2018   Procedure: LEFT TOTAL KNEE ARTHROPLASTY;  Surgeon: Sydnee Cabal, MD;  Location: [facility];  Service: Orthopedics;  Laterality: Left;   TUBAL LIGATION  1966    Family History  Problem  Relation Age of Onset   Heart disease Father        died age 39- MI   Alcohol abuse Brother    Heart disease Brother    Heart disease Sister    Breast cancer Neg Hx     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    Comment: [employer]  Tobacco Use   Smoking status: Never    Passive exposure: Never   Smokeless tobacco: Never  Vaping Use   Vaping Use: Never used  Substance and Sexual Activity   Alcohol use: No   Drug use: No   Sexual activity: Not Currently  Other Topics Concern   Not on file  Social History Narrative   Has living will   Designated son Alcide Goodness and other children to make health care decisions for her   Would accept CPR/resuscitation   Hasn't considered tube feedings--would leave to son   Social Determinants of Health   Financial Resource Strain: Low Risk  (08/18/2021)   Overall Financial Resource Strain (CARDIA)    Difficulty of Paying Living Expenses: Not hard at all  Food Insecurity: No Food Insecurity (08/15/2022)   Hunger Vital Sign    Worried About Running Out of Food in the Last Year: Never true    [place] in the Last Year: Never true  Transportation Needs: No Transportation Needs (08/15/2022)   PRAPARE - Hydrologist (Medical): No    Lack of Transportation (Non-Medical): No  Physical Activity: Insufficiently Active (08/18/2021)   Exercise Vital Sign    Days of Exercise per Week: 4 days    Minutes of Exercise per Session: 10 min  Stress: No Stress Concern Present (08/18/2021)   [place]    Feeling of Stress : Only a little  Social Connections: Moderately Isolated (08/18/2021)   Social Connection and Isolation Panel [NHANES]     Frequency of Communication with Friends and Family: More than three times a week    Frequency of Social Gatherings with Friends and Family: Never    Attends Religious Services: 1 to 4 times per year    Active Member of [employer] or Organizations: No    Attends Archivist Meetings: Never    Marital Status: Divorced  Human resources officer Violence: Not At Risk (08/18/2021)   Humiliation, Afraid, Rape, and Kick questionnaire    Fear of Current or Ex-Partner: No    Emotionally Abused: No    Physically Abused: No    Sexually Abused: No    Review of Systems Appetite is really not big--weight going up Sleeps okay Wears seat belt Teeth okay--keeps up with  dentist No heartburn or dysphagia---on pantoprazole Bowels fine--no blood OAB---not sure if myrbetriq helps No suspicious skin lesions    Objective:   Physical Exam Constitutional:      Appearance: Normal appearance. She is obese.  HENT:     Mouth/Throat:     Comments: No lesions Eyes:     Conjunctiva/sclera: Conjunctivae normal.     Pupils: Pupils are equal, round, and reactive to light.  Cardiovascular:     Rate and Rhythm: Regular rhythm. Bradycardia present.     Pulses: Normal pulses.     Heart sounds: No murmur heard. Pulmonary:     Effort: Pulmonary effort is normal.     Breath sounds: Normal breath sounds. No wheezing or rales.  Abdominal:     Palpations: Abdomen is soft.     Tenderness: There is no abdominal tenderness.  Musculoskeletal:     Cervical back: Neck supple.     Comments: Thick calves without pitting  Lymphadenopathy:     Cervical: No cervical adenopathy.  Skin:    Findings: No lesion or rash.  Neurological:     General: No focal deficit present.     Mental Status: She is alert and oriented to person, place, and time.     Comments: Word naming---stumped---3 quickly then stopped Recall 2/3  Psychiatric:        Mood and Affect: Mood normal.        Behavior: Behavior normal.             Assessment & Plan:

## 2023-01-12 DIAGNOSIS — G4733 Obstructive sleep apnea (adult) (pediatric): Secondary | ICD-10-CM | POA: Diagnosis not present

## 2023-01-12 DIAGNOSIS — I5032 Chronic diastolic (congestive) heart failure: Secondary | ICD-10-CM | POA: Diagnosis not present

## 2023-01-13 ENCOUNTER — Encounter: Payer: Self-pay | Admitting: Pulmonary Disease

## 2023-01-13 ENCOUNTER — Ambulatory Visit (INDEPENDENT_AMBULATORY_CARE_PROVIDER_SITE_OTHER): Payer: 59 | Admitting: Pulmonary Disease

## 2023-01-13 VITALS — BP 124/80 | HR 65 | Temp 97.6°F | Ht 63.5 in | Wt 275.0 lb

## 2023-01-13 DIAGNOSIS — J4540 Moderate persistent asthma, uncomplicated: Secondary | ICD-10-CM | POA: Insufficient documentation

## 2023-01-13 DIAGNOSIS — R062 Wheezing: Secondary | ICD-10-CM | POA: Diagnosis not present

## 2023-01-13 DIAGNOSIS — R0602 Shortness of breath: Secondary | ICD-10-CM

## 2023-01-13 LAB — NITRIC OXIDE: Nitric Oxide: 40

## 2023-01-13 MED ORDER — BREZTRI AEROSPHERE 160-9-4.8 MCG/ACT IN AERO: 2.0000 | INHALATION_SPRAY | Freq: Two times a day (BID) | RESPIRATORY_TRACT | 0 refills | Status: DC

## 2023-01-13 MED ORDER — AIRSUPRA 90-80 MCG/ACT IN AERO: 2.0000 | INHALATION_SPRAY | RESPIRATORY_TRACT | 0 refills | Status: DC | PRN

## 2023-01-13 NOTE — Progress Notes (Signed)
Subjective:    Patient ID: Autumn Price, female    DOB: [birth year], 82 y.o.   MRN: [id number] Patient Care Team: Autumn Schwalbe, MD as PCP - General Autumn Odea, MD as PCP - Cardiology (Cardiology) Autumn Reichert, MD as PCP - Sleep Medicine (Cardiology)  Chief Complaint  Patient presents with   Follow-up    Wheezing. SOB with exertion. Cough with light yellow sputum.    HPI Autumn Price is a 82 year old lifelong never smoker who presents for follow-up visit on the issue of moderate persistent asthma with recent exacerbation.  We last saw her on 27 November 2022 for an acute visit and at that time she had an asthma exacerbation.  Noted to have elevated nitric oxide levels even on prednisone at that time.  She was given a trial of Trelegy Ellipta 200, was instructed to continue her prednisone taper and was also given an Azithromycin Z-Pak as well.  Prior PFTs were unremarkable with the exception of mild restriction likely on the basis of obesity. Patient noted that while she was on the prednisone taper she did very well however, once the prednisone finished she started having issues with wheezing. FeNO on 13 December was 48.  Patient presents today for follow-up of these issues.  She has noted cough productive of white to yellowish sputum mostly in the mornings.  No fevers chills or sweats since she completed her prednisone. She has not had any chest pain.  No lower extremity edema or calf tenderness.  She does not endorse any other symptomatology.  She does not tolerate Trelegy well due to the powder preparation "gagging her".  She also notes that it does not last her through the day.  She does get relief early on on the day after taking it.   DATA: 10/13/2019 2D echo: LVEF 60 to 65%, essentially normal, no pulmonary hypertension 06/05/2020 overnight oximetry: Shows desaturations to 72% throughout the course of the night with a high oxygen desaturation index/sleep disturbance (24 events  per hour) 06/27/2020 PFTs: FEV1 1.86 L or 93% predicted, FVC 2.28 L or 86% predicted.  FEV1/FVC 82%. No bronchodilator response.  Diffusion capacity not performed despite being ordered.  No obstructive defect.  No restrictive defect 10/09/2020 Home sleep study: Desaturations noted during study as low as 81%.  Average of 91%.  AHI was 15.5, consistent with moderate obstructive sleep apnea 10/17/2020 nuclear stress test: Low risk study, sensitivity reduced to the patient's body habitus, coronary artery calcifications noted on attenuation correction CT. 06/03/2022 chest x-ray PA and lateral: No active cardiopulmonary disease. 02/21/2022 CBC with differential/IgE: CBC normal with no eosinophilia, IgE 8 IU/mL (normal).  Review of Systems A 10 point review of systems was performed and it is as noted above otherwise negative.  Patient Active Problem List   Diagnosis Date Noted   GERD (gastroesophageal reflux disease) 09/03/2022   Hx of sleep apnea 09/03/2022   Eosinophilic bronchitis 06/14/2022   Thrombocytopenia (HCC) 01/03/2022   Chronic hypoxemic respiratory failure (HCC) 01/02/2022   Stage 3a chronic kidney disease (HCC) 01/02/2022   Chronic narcotic dependence (HCC) 01/02/2022   Persistent cough 05/11/2020   Chronic diastolic heart failure (HCC) 10/25/2019   DOE (dyspnea on exertion) 10/11/2019   Primary osteoarthritis of right knee 03/20/2018   S/P knee replacement 03/20/2018   Osteoarthritis of left knee 08/30/2016   Advance directive discussed with patient 08/04/2015   Mood disorder (HCC) 06/10/2014   Morbid obesity (HCC) 05/22/2012   Routine general medical examination at  a health care facility 03/19/2011   Mixed stress and urge urinary incontinence 03/19/2011   Essential hypertension, benign 08/21/2010   Chronic venous insufficiency 08/21/2010   Osteoarthritis, multiple sites 08/21/2010   Social History   Tobacco Use   Smoking status: Never    Passive exposure: Never    Smokeless tobacco: Never  Substance Use Topics   Alcohol use: No   No Known Allergies  Current Meds  Medication Sig   acetaminophen (TYLENOL) 500 MG tablet Take 500 mg by mouth every 6 (six) hours as needed for mild pain or moderate pain.   albuterol (VENTOLIN HFA) 108 (90 Base) MCG/ACT inhaler INHALE 1 TO 2 PUFFS BY MOUTH INTO THE LUNGS EVERY 6 HOURS AS NEEDED FOR WHEEZING OR SHORTNESS OF BREATH   budesonide (PULMICORT) 0.5 MG/2ML nebulizer solution Take 2 mLs (0.5 mg total) by nebulization in the morning and at bedtime.   fluticasone (FLONASE) 50 MCG/ACT nasal spray PLACE 1 SPRAY INTO BOTH NOSTRILS DAILY AS NEEDED FOR ALLERGIES OR RHINITIS.   Fluticasone-Umeclidin-Vilant (TRELEGY ELLIPTA) 200-62.5-25 MCG/ACT AEPB Inhale 1 puff into the lungs daily.   furosemide (LASIX) 20 MG tablet Take 1 tablet (20 mg total) by mouth daily.   ketoconazole (NIZORAL) 2 % cream APPLY 1 APPLICATION TOPICALLY TO GROIN AREA 2 TIMES A DAY AS NEEDED FOR IRRITATION   metoprolol tartrate (LOPRESSOR) 25 MG tablet Take 1 tablet (25 mg total) by mouth 2 (two) times daily.   mirabegron ER (MYRBETRIQ) 50 MG TB24 tablet TAKE 1 TABLET ONCE DAILY   montelukast (SINGULAIR) 10 MG tablet Take 1 tablet (10 mg total) by mouth at bedtime.   pantoprazole (PROTONIX) 40 MG tablet Take 1 tablet (40 mg total) by mouth daily.   sertraline (ZOLOFT) 50 MG tablet Take 1 tablet (50 mg total) by mouth daily.   traMADol (ULTRAM) 50 MG tablet Take 1-2 tablets (50-100 mg total) by mouth every 12 (twelve) hours as needed.   Immunization History  Administered Date(s) Administered   Fluad Quad(high Dose 65+) 10/11/2020   Influenza Inj Mdck Quad Pf 10/30/2018, 09/25/2019   Influenza, Seasonal, Injecte, Preservative Fre 09/30/2016   Influenza,inj,Quad PF,6+ Mos 09/12/2017   Influenza-Unspecified 09/12/2021, 09/19/2022   Pneumococcal Conjugate-13 06/10/2014   Pneumococcal Polysaccharide-23 01/16/2009, 08/12/2017   Td 08/21/2010   Tdap  09/19/2022   Zoster Recombinat (Shingrix) 09/30/2019, 01/11/2020   Zoster, Live 09/18/2016       Objective:   Physical Exam BP 124/80 (BP Location: Left Arm, Cuff Size: Large)   Pulse 65   Temp 97.6 F (36.4 C)   Ht 5' 3.5" (1.613 m)   Wt 275 lb (124.7 kg) Comment: per patient  SpO2 98%   BMI 47.95 kg/m   GENERAL: Obese woman, no acute distress, fully ambulatory, no conversational dyspnea.  Appears somewhat uncomfortable but not toxic appearing HEAD: Normocephalic, atraumatic.  EYES: Pupils equal, round, reactive to light.  No scleral icterus.  MOUTH: Dentition intact, oral mucosa moist. NECK: Supple. No thyromegaly. Trachea midline. No JVD.  No adenopathy. PULMONARY: Good air entry bilaterally.  Coarse breath sounds otherwise, no adventitious sounds. CARDIOVASCULAR: S1 and S2. Regular rate and rhythm.  No rubs, murmurs or gallops heard. ABDOMEN: Obese otherwise benign. MUSCULOSKELETAL: No joint deformity, no clubbing, no edema.  NEUROLOGIC: No focal deficit, no gait disturbance, speech is fluent. SKIN: Intact,warm,dry.  Limited exam shows no rashes PSYCH: Mood appropriate, behavior normal.  Lab Results  Component Value Date   NITRICOXIDE 40 01/13/2023       Assessment &  Plan:     ICD-10-CM   1. Moderate persistent asthma without complication  G26.94 Pulmonary Function Test [facility] Only   Will switch Trelegy to [facility] 2 puffs twice a day She does have an element of asthmatic bronchitis as well Trial of air supra as rescue Recheck PFTs    2. Shortness of breath  R06.02 Nitric oxide   Overall improved with Trelegy Not tolerating Trelegy, switching as above    3. Morbid obesity ([place])  E66.01    This issue adds complexity to her management She would benefit from weight loss     Orders Placed This Encounter  Procedures   Nitric oxide   Pulmonary Function Test [facility] Only    Standing Status:   Future    Standing Expiration Date:   01/14/2024    Order Specific  Question:   Full PFT: includes the following: basic spirometry, spirometry pre & post bronchodilator, diffusion capacity (DLCO), lung volumes    Answer:   Full PFT    Order Specific Question:   This test can only be performed at    Answer:   [place] ordered this encounter  Medications   Budeson-Glycopyrrol-Formoterol (BREZTRI AEROSPHERE) 160-9-4.8 MCG/ACT AERO    Sig: Inhale 2 puffs into the lungs in the morning and at bedtime.    Dispense:  5.9 g    Refill:  0    Order Specific Question:   Lot Number?    Answer:   [id number]    Order Specific Question:   Expiration Date?    Answer:   05/16/2025    Order Specific Question:   Manufacturer?    Answer:   [employer] [[id number]]    Order Specific Question:   Quantity    Answer:   2   Albuterol-Budesonide (AIRSUPRA) 90-80 MCG/ACT AERO    Sig: Inhale 2 puffs into the lungs every 4 (four) hours as needed.    Dispense:  10.7 g    Refill:  0    Order Specific Question:   Lot Number?    Answer:   [id number]    Order Specific Question:   Expiration Date?    Answer:   01/16/2025    Order Specific Question:   Manufacturer?    Answer:   [employer] [[id number]]    Order Specific Question:   Quantity    Answer:   1   Will see the patient in follow-up in 4 to 6 weeks time she is to contact us prior to that time should any new difficulties arise.  Renold Don, MD Advanced Bronchoscopy PCCM [facility]    *This note was dictated using voice recognition software/Dragon.  Despite best efforts to proofread, errors can occur which can change the meaning. Any transcriptional errors that result from this process are unintentional and may not be fully corrected at the time of dictation.

## 2023-01-13 NOTE — Patient Instructions (Signed)
We are switching your Trelegy to [facility] 2 puffs twice a day.  Do not take the Trelegy any longer.  We have given you a sample of a stronger albuterol that is called AirSupra, that is 2 puffs every 4-6 hours as needed for shortness of breath.  Hopefully this will avoid having to give you extra steroids which can cause other problems.  Let us know how you do with the Judithann Sauger so we can call in the prescription for you.  We will see you in follow-up in 4 to 6 weeks time call sooner should any new problems arise.

## 2023-01-16 ENCOUNTER — Other Ambulatory Visit: Payer: Self-pay | Admitting: Internal Medicine

## 2023-01-16 DIAGNOSIS — G4733 Obstructive sleep apnea (adult) (pediatric): Secondary | ICD-10-CM | POA: Diagnosis not present

## 2023-01-16 DIAGNOSIS — I5032 Chronic diastolic (congestive) heart failure: Secondary | ICD-10-CM | POA: Diagnosis not present

## 2023-01-16 MED ORDER — TRAMADOL HCL 50 MG PO TABS: 50.0000 mg | ORAL_TABLET | Freq: Two times a day (BID) | ORAL | 0 refills | Status: DC | PRN

## 2023-01-16 NOTE — Telephone Encounter (Signed)
Last filled 12-16-22 #120 Last OV 01-07-23 Next OV 01-09-24 [facility]

## 2023-01-16 NOTE — Telephone Encounter (Signed)
Prescription Request  01/16/2023  Is this a "Controlled Substance" medicine? No  LOV: 01/07/2023  What is the name of the medication or equipment? traMADol (ULTRAM) 50 MG tablet   Have you contacted your pharmacy to request a refill? Yes   Which pharmacy would you like this sent to?  [place], [place] - [address] [place] [address] Phone: [phone] Fax: [phone]     Patient notified that their request is being sent to the clinical staff for review and that they should receive a response within 2 business days.

## 2023-02-04 ENCOUNTER — Ambulatory Visit: Payer: 59 | Attending: Internal Medicine

## 2023-02-04 DIAGNOSIS — J4540 Moderate persistent asthma, uncomplicated: Secondary | ICD-10-CM | POA: Diagnosis not present

## 2023-02-04 LAB — PULMONARY FUNCTION TEST ARMC ONLY
DL/VA % pred: 91 %
DL/VA: 3.76 ml/min/mmHg/L
DLCO unc % pred: 91 %
DLCO unc: 16.66 ml/min/mmHg
FEF 25-75 Post: 2.3 L/sec
FEF 25-75 Pre: 1.8 L/sec
FEF2575-%Change-Post: 27 %
FEF2575-%Pred-Post: 164 %
FEF2575-%Pred-Pre: 128 %
FEV1-%Change-Post: 5 %
FEV1-%Pred-Post: 94 %
FEV1-%Pred-Pre: 90 %
FEV1-Post: 1.79 L
FEV1-Pre: 1.69 L
FEV1FVC-%Change-Post: 3 %
FEV1FVC-%Pred-Pre: 109 %
FEV6-%Change-Post: 2 %
FEV6-%Pred-Post: 89 %
FEV6-%Pred-Pre: 87 %
FEV6-Post: 2.13 L
FEV6-Pre: 2.09 L
FEV6FVC-%Pred-Post: 105 %
FEV6FVC-%Pred-Pre: 105 %
FVC-%Change-Post: 2 %
FVC-%Pred-Post: 84 %
FVC-%Pred-Pre: 82 %
FVC-Post: 2.13 L
FVC-Pre: 2.09 L
Post FEV1/FVC ratio: 84 %
Post FEV6/FVC ratio: 100 %
Pre FEV1/FVC ratio: 81 %
Pre FEV6/FVC Ratio: 100 %
RV % pred: 114 %
RV: 2.66 L
TLC % pred: 101 %
TLC: 4.99 L

## 2023-02-04 MED ORDER — ALBUTEROL SULFATE (2.5 MG/3ML) 0.083% IN NEBU
2.5000 mg | INHALATION_SOLUTION | Freq: Once | RESPIRATORY_TRACT | Status: AC
  Administered 2023-02-04: 2.5 mg via RESPIRATORY_TRACT

## 2023-02-11 ENCOUNTER — Other Ambulatory Visit: Payer: Self-pay | Admitting: Internal Medicine

## 2023-02-11 ENCOUNTER — Telehealth: Payer: Self-pay | Admitting: Pulmonary Disease

## 2023-02-11 MED ORDER — BREZTRI AEROSPHERE 160-9-4.8 MCG/ACT IN AERO: 2.0000 | INHALATION_SPRAY | Freq: Two times a day (BID) | RESPIRATORY_TRACT | 3 refills | Status: DC

## 2023-02-11 MED ORDER — PANTOPRAZOLE SODIUM 40 MG PO TBEC: 40.0000 mg | DELAYED_RELEASE_TABLET | Freq: Every day | ORAL | 3 refills | Status: DC

## 2023-02-11 MED ORDER — AIRSUPRA 90-80 MCG/ACT IN AERO: 2.0000 | INHALATION_SPRAY | RESPIRATORY_TRACT | 3 refills | Status: DC | PRN

## 2023-02-11 NOTE — Telephone Encounter (Signed)
I have sent in the refills and notified the patient.  Nothing further needed.

## 2023-02-11 NOTE — Telephone Encounter (Signed)
Last filled 01-16-23 #120 Last OV 01-07-23 Next OV 01-09-24 [facility]

## 2023-02-11 NOTE — Telephone Encounter (Signed)
Pt is asking for a rx for the Breztri and air supra as well as a refill rx for protonix to be sent to [facility]

## 2023-02-12 ENCOUNTER — Other Ambulatory Visit (HOSPITAL_COMMUNITY): Payer: Self-pay

## 2023-02-12 ENCOUNTER — Telehealth: Payer: Self-pay

## 2023-02-12 DIAGNOSIS — G4733 Obstructive sleep apnea (adult) (pediatric): Secondary | ICD-10-CM | POA: Diagnosis not present

## 2023-02-12 DIAGNOSIS — I5032 Chronic diastolic (congestive) heart failure: Secondary | ICD-10-CM | POA: Diagnosis not present

## 2023-02-12 NOTE — Telephone Encounter (Signed)
PA request received from patients pharmacy for Airsupra 90-80MCG/ACT aerosol  PA has been submitted via CMM to [employer] and is pending determination.  Key: [id number]

## 2023-02-12 NOTE — Telephone Encounter (Signed)
PA has been DENIED due to:   AIRSUPRA AER 90-80MCG is denied because it is not on your plan's Drug List (formulary). Medication authorization requires the following: (1) You need to try this covered drug: Levalbuterol HFA; OR (2) your doctor needs to give [place] specific medical reasons why the covered drug is not appropriate for you.

## 2023-02-13 MED ORDER — LEVALBUTEROL TARTRATE 45 MCG/ACT IN AERO: 2.0000 | INHALATION_SPRAY | Freq: Four times a day (QID) | RESPIRATORY_TRACT | 2 refills | Status: DC | PRN

## 2023-02-13 NOTE — Telephone Encounter (Signed)
Can try levo albuterol HFA 45 mcg 2 puffs q 4-6 h prn sob/wheezing.

## 2023-02-13 NOTE — Addendum Note (Signed)
Addended by: Claudette Head A on: 02/13/2023 11:09 AM   Modules accepted: Orders

## 2023-02-13 NOTE — Telephone Encounter (Signed)
Patient is aware of below message and voiced her understanding. Xopenex has been sent to preferred pharmacy. Nothing further needed.

## 2023-02-18 ENCOUNTER — Ambulatory Visit (INDEPENDENT_AMBULATORY_CARE_PROVIDER_SITE_OTHER): Payer: 59 | Admitting: Pulmonary Disease

## 2023-02-18 ENCOUNTER — Encounter: Payer: Self-pay | Admitting: Pulmonary Disease

## 2023-02-18 VITALS — BP 130/84 | HR 65 | Temp 97.5°F | Ht 63.5 in | Wt 275.0 lb

## 2023-02-18 DIAGNOSIS — J019 Acute sinusitis, unspecified: Secondary | ICD-10-CM | POA: Diagnosis not present

## 2023-02-18 DIAGNOSIS — J4489 Other specified chronic obstructive pulmonary disease: Secondary | ICD-10-CM

## 2023-02-18 DIAGNOSIS — J4540 Moderate persistent asthma, uncomplicated: Secondary | ICD-10-CM

## 2023-02-18 MED ORDER — DOXYCYCLINE HYCLATE 100 MG PO TABS: 100.0000 mg | ORAL_TABLET | Freq: Two times a day (BID) | ORAL | 0 refills | Status: DC

## 2023-02-18 NOTE — Progress Notes (Signed)
Subjective:    Patient ID: Autumn Price, female    DOB: 24-Dec-1940, 82 y.o.   MRN: [id number] Patient Care Team: Venia Carbon, MD as PCP - General Kate Sable, MD as PCP - Cardiology (Cardiology) Sueanne Margarita, MD as PCP - Sleep Medicine (Cardiology)  Chief Complaint  Patient presents with   Follow-up    SOB with exertion. Wheezing. Cough with yellow sputum.     HPI Autumn Price is an 82 year old lifelong never smoker who presents for follow-up visit on the issue of moderate persistent asthma.  I last saw her on 13 January 2023 and at that time switched her to [facility] from Trelegy as she felt the powder preparation of Trelegy was "gagging her".  She is tolerating the Breztri well and also the levo albuterol as rescue.  She had previously been prescribed AirSupra however this is not covered by her insurance.  She had been doing well until a few days ago when she developed increased nasal congestion and purulent nasal discharge.  However her dyspnea and bronchospasm are well-controlled with the [facility]  She has not had any fevers, chills or sweats.  No general malaise.  No arthralgias or myalgias.  Overall she feels that the [facility] and the levo albuterol are helping her with her asthma symptoms overall.  DATA: 10/13/2019 2D echo: LVEF 60 to 65%, essentially normal, no pulmonary hypertension 06/05/2020 overnight oximetry: Shows desaturations to 72% throughout the course of the night with a high oxygen desaturation index/sleep disturbance (24 events per hour) 06/27/2020 PFTs: FEV1 1.86 L or 93% predicted, FVC 2.28 L or 86% predicted.  FEV1/FVC 82%. No bronchodilator response.  Diffusion capacity not performed despite being ordered.  No obstructive defect.  No restrictive defect 10/09/2020 Home sleep study: Desaturations noted during study as low as 81%.  Average of 91%.  AHI was 15.5, consistent with moderate obstructive sleep apnea 10/17/2020 nuclear stress test: Low risk study,  sensitivity reduced to the patient's body habitus, coronary artery calcifications noted on attenuation correction CT. 06/03/2022 chest x-ray PA and lateral: No active cardiopulmonary disease. 02/21/2022 CBC with differential/IgE: CBC normal with no eosinophilia, IgE 8 IU/mL (normal). 02/04/2023 PFTs: FEV1 1.69 L or 90% predicted, FVC 2.09 L or 82% predicted, FEV1/FVC 81%, no significant bronchodilator response, diffusion capacity normal.  Consistent with mild obstructive defect.  Review of Systems A 10 point review of systems was performed and it is as noted above otherwise negative.  Patient Active Problem List   Diagnosis Date Noted   Moderate persistent asthma without complication 99991111   GERD (gastroesophageal reflux disease) 09/03/2022   Hx of sleep apnea [id number]   Eosinophilic bronchitis [id number]   Thrombocytopenia ([place]) 01/03/2022   Chronic hypoxemic respiratory failure ([place]) 01/02/2022   Stage 3a chronic kidney disease ([place]) 01/02/2022   Chronic narcotic dependence ([place]) 01/02/2022   Persistent cough 05/11/2020   Chronic diastolic heart failure ([place]) 10/25/2019   DOE (dyspnea on exertion) 10/11/2019   Primary osteoarthritis of right knee 03/20/2018   S/P knee replacement 03/20/2018   Osteoarthritis of left knee 08/30/2016   Advance directive discussed with patient 08/04/2015   Mood disorder ([place]) 06/10/2014   Morbid obesity ([place]) 05/22/2012   Routine general medical examination at a health care facility 03/19/2011   Mixed stress and urge urinary incontinence 03/19/2011   Essential hypertension, benign 08/21/2010   Chronic venous insufficiency 08/21/2010   Osteoarthritis, multiple sites 08/21/2010   Social History   Tobacco Use   Smoking status: Never  Passive exposure: Never   Smokeless tobacco: Never  Substance Use Topics   Alcohol use: No   No Known Allergies  Current Meds  Medication Sig   acetaminophen (TYLENOL) 500 MG tablet Take 500 mg by mouth  every 6 (six) hours as needed for mild pain or moderate pain.   Budeson-Glycopyrrol-Formoterol (BREZTRI AEROSPHERE) 160-9-4.8 MCG/ACT AERO Inhale 2 puffs into the lungs in the morning and at bedtime.   budesonide (PULMICORT) 0.5 MG/2ML nebulizer solution Take 2 mLs (0.5 mg total) by nebulization in the morning and at bedtime.   fluticasone (FLONASE) 50 MCG/ACT nasal spray PLACE 1 SPRAY INTO BOTH NOSTRILS DAILY AS NEEDED FOR ALLERGIES OR RHINITIS.   furosemide (LASIX) 20 MG tablet Take 1 tablet (20 mg total) by mouth daily.   ketoconazole (NIZORAL) 2 % cream APPLY 1 APPLICATION TOPICALLY TO GROIN AREA 2 TIMES A DAY AS NEEDED FOR IRRITATION   levalbuterol (XOPENEX HFA) 45 MCG/ACT inhaler Inhale 2 puffs into the lungs every 6 (six) hours as needed for wheezing.   metoprolol tartrate (LOPRESSOR) 25 MG tablet Take 1 tablet (25 mg total) by mouth 2 (two) times daily.   mirabegron ER (MYRBETRIQ) 50 MG TB24 tablet TAKE 1 TABLET ONCE DAILY   montelukast (SINGULAIR) 10 MG tablet Take 1 tablet (10 mg total) by mouth at bedtime.   pantoprazole (PROTONIX) 40 MG tablet Take 1 tablet (40 mg total) by mouth daily.   sertraline (ZOLOFT) 50 MG tablet Take 1 tablet (50 mg total) by mouth daily.   traMADol (ULTRAM) 50 MG tablet Take 1-2 tablets (50-100 mg total) by mouth every 12 (twelve) hours as needed.   Immunization History  Administered Date(s) Administered   Fluad Quad(high Dose 65+) 10/11/2020   Influenza Inj Mdck Quad Pf 10/30/2018, 09/25/2019   Influenza, Seasonal, Injecte, Preservative Fre 09/30/2016   Influenza,inj,Quad PF,6+ Mos 09/12/2017   Influenza-Unspecified 09/12/2021, 09/19/2022   Pneumococcal Conjugate-13 06/10/2014   Pneumococcal Polysaccharide-23 01/16/2009, 08/12/2017   Td 08/21/2010   Tdap 09/19/2022   Zoster Recombinat (Shingrix) 09/30/2019, 01/11/2020   Zoster, Live 09/18/2016        Objective:   Physical Exam BP 130/84 (BP Location: Left Wrist, Cuff Size: Normal)   Pulse 65    Temp (!) 97.5 F (36.4 C)   Ht 5' 3.5" (1.613 m)   Wt 275 lb (124.7 kg) Comment: per patient  SpO2 96%   BMI 47.95 kg/m    SpO2: 96 % O2 Device: None (Room air)  GENERAL: Morbidly obese woman, no acute distress, fully ambulatory, no conversational dyspnea.  HEAD: Normocephalic, atraumatic.  EYES: Pupils equal, round, reactive to light.  No scleral icterus.  MOUTH: Dentition intact, oral mucosa moist. NECK: Supple. No thyromegaly. Trachea midline. No JVD.  No adenopathy. PULMONARY: Good air entry bilaterally.  Coarse breath sounds otherwise, no adventitious sounds. CARDIOVASCULAR: S1 and S2. Regular rate and rhythm.  No rubs, murmurs or gallops heard. ABDOMEN: Obese otherwise benign. MUSCULOSKELETAL: No joint deformity, no clubbing, no edema.  NEUROLOGIC: No focal deficit, no gait disturbance, speech is fluent. SKIN: Intact,warm,dry.  Limited exam shows no rashes PSYCH: Mood appropriate, behavior normal.       Assessment & Plan:     ICD-10-CM   1. Moderate persistent asthma without complication  123456    Obesity/eosinophilic phenotype Continue Breztri 2 puffs twice a day Continue as needed levo albuterol    2. Chronic asthmatic bronchitis  J44.89    Continue Breztri as above    3. Acute rhinosinusitis  J01.90  Doxycycline 100 mg twice daily x 7 days     Meds ordered this encounter  Medications   doxycycline (VIBRA-TABS) 100 MG tablet    Sig: Take 1 tablet (100 mg total) by mouth 2 (two) times daily.    Dispense:  14 tablet    Refill:  0   The patient in follow-up in 4 to 6 weeks time she is to call sooner should any new problems arise.  Renold Don, MD Advanced Bronchoscopy PCCM [facility]    *This note was dictated using voice recognition software/Dragon.  Despite best efforts to proofread, errors can occur which can change the meaning. Any transcriptional errors that result from this process are unintentional and may not be fully  corrected at the time of dictation.

## 2023-02-18 NOTE — Patient Instructions (Addendum)
We are sending in a prescription for an antibiotic that I want to take for 7 days and it is for your sinus infection.  We will see you in follow-up in 4 to 6 weeks time call sooner should any new problems arise.

## 2023-02-21 ENCOUNTER — Telehealth: Payer: Self-pay | Admitting: Pulmonary Disease

## 2023-02-21 MED ORDER — METHYLPREDNISOLONE 4 MG PO TBPK: ORAL_TABLET | ORAL | 0 refills | Status: DC

## 2023-02-21 NOTE — Telephone Encounter (Signed)
Doxycycline prescribed on 02/18/2023. Patient reports of worsening cough, increased fatigue and wheezing since starting abx. Cough is croupy and productive with yellow sputum.  She feels that she is ' burning up' but she does not have a fever. She is concerned that she is reacting to the abx.  Using albuterol TID and breztri BID.  She took some OTC cough syrup this morning to see if that would help with cough.  No recent flu test.  Negative home covid test yesterday.  Dr. Patsey Berthold, please advise. Thanks

## 2023-02-21 NOTE — Telephone Encounter (Signed)
Pt states she has a croupy cough that is nonproductive . She is coughing to the point of being breathless

## 2023-02-21 NOTE — Telephone Encounter (Signed)
We can send a Medrol Dosepak.  She can hold off on taking the antibiotic though I do not think it is a reaction of the antibiotic but in general to the illness.  If her symptoms persist she will need to be seen at either urgent care or ED.

## 2023-02-21 NOTE — Telephone Encounter (Signed)
Patient is aware of below message/recommendations and voiced her understanding.  Medrol sent to preferred pharmacy. She will hold off on doxy.  Nothing further needed.

## 2023-03-13 ENCOUNTER — Other Ambulatory Visit: Payer: Self-pay | Admitting: Internal Medicine

## 2023-03-13 DIAGNOSIS — I5032 Chronic diastolic (congestive) heart failure: Secondary | ICD-10-CM | POA: Diagnosis not present

## 2023-03-13 DIAGNOSIS — G4733 Obstructive sleep apnea (adult) (pediatric): Secondary | ICD-10-CM | POA: Diagnosis not present

## 2023-03-17 NOTE — Telephone Encounter (Signed)
Last filled 02-11-23 #120 Last OV 01-07-23 Next OV 01-09-24 [facility]

## 2023-03-24 ENCOUNTER — Encounter: Payer: Self-pay | Admitting: Pulmonary Disease

## 2023-03-24 ENCOUNTER — Ambulatory Visit (INDEPENDENT_AMBULATORY_CARE_PROVIDER_SITE_OTHER): Payer: 59 | Admitting: Pulmonary Disease

## 2023-03-24 ENCOUNTER — Ambulatory Visit: Payer: 59 | Attending: Cardiology | Admitting: Cardiology

## 2023-03-24 ENCOUNTER — Encounter: Payer: Self-pay | Admitting: Cardiology

## 2023-03-24 VITALS — BP 122/80 | HR 65 | Temp 98.0°F | Ht 64.0 in | Wt 277.0 lb

## 2023-03-24 VITALS — BP 132/77 | HR 62 | Ht 64.0 in | Wt 277.0 lb

## 2023-03-24 DIAGNOSIS — I471 Supraventricular tachycardia, unspecified: Secondary | ICD-10-CM | POA: Diagnosis not present

## 2023-03-24 DIAGNOSIS — R0602 Shortness of breath: Secondary | ICD-10-CM

## 2023-03-24 DIAGNOSIS — J4540 Moderate persistent asthma, uncomplicated: Secondary | ICD-10-CM

## 2023-03-24 DIAGNOSIS — I10 Essential (primary) hypertension: Secondary | ICD-10-CM

## 2023-03-24 DIAGNOSIS — J4489 Other specified chronic obstructive pulmonary disease: Secondary | ICD-10-CM

## 2023-03-24 NOTE — Patient Instructions (Signed)
Continue using your Breztri 2 puffs twice a day.  You can resume the nebulizer only during times of flares of your asthma.  Let us know if your rescue inhaler goes down to 40 puffs and you still have not received a replacement we may need to change it to a different medication.  We will see him in follow-up in 3 months time call sooner should any new problems arise.

## 2023-03-24 NOTE — Progress Notes (Signed)
Cardiology Office Note:    Date:  03/24/2023   ID:  Autumn SewerWanda C Fairfax, DOB Apr 05, 1941, MRN [id number]  PCP:  Karie SchwalbeLetvak, Richard I, MD  [facility] Cardiologist:  Debbe OdeaBrian Agbor-Etang, MD  [facility] Electrophysiologist:  None   Referring MD: Karie SchwalbeLetvak, Richard I, MD   Chief Complaint  Patient presents with   Follow-up    Reports 2-3 chest pain episodes about 2 weeks ago.      History of Present Illness:    Autumn Price is a 82 y.o. female with a hx of paroxysmal SVT, hypertension, obesity, OSA who presents for follow-up.    States feeling okay, denies shortness of breath when she overexerts herself.  Denies palpitations, dizziness.  Endorse having a wobbly gait, uses a walker periodically.  Overall doing well, has no concerns at this time, BP adequately controlled.  Prior notes Echo 09/2019 EF 60 to 65%, diastolic function normal Lexiscan Myoview 10/2020 no evidence for ischemia, minimal coronary artery calcification noted  father had an MI in his 3340s.  Previously had lower extremity edema which is improved with Lasix. Nutritional services not covered by insurance,  Patient previously saw pulmonary medicine, had PFTs done and were more consistent with obesity/obesity hypoventilation. No evidence of obstruction or restriction noted.    Past Medical History:  Diagnosis Date   Depression    Hypertension    Osteoarthritis    Urge incontinence    Venous insufficiency     Past Surgical History:  Procedure Laterality Date   APPENDECTOMY  1966   DILATION AND CURETTAGE OF UTERUS  01/27/14   benign   JOINT REPLACEMENT     right knee   TOTAL KNEE ARTHROPLASTY Right 03/20/2018   Procedure: RIGHT TOTAL KNEE ARTHROPLASTY;  Surgeon: Eugenia Mcalpineollins, Robert, MD;  Location: [facility];  Service: Orthopedics;  Laterality: Right;   TOTAL KNEE ARTHROPLASTY Left 09/11/2018   Procedure: LEFT TOTAL KNEE ARTHROPLASTY;  Surgeon: Eugenia Mcalpineollins, Robert, MD;  Location: [facility];  Service: Orthopedics;  Laterality: Left;    TUBAL LIGATION  1966    Current Medications: No outpatient medications have been marked as taking for the 03/24/23 encounter (Office Visit) with Debbe OdeaAgbor-Etang, Clair Alfieri, MD.     Allergies:   Patient has no known allergies.   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    Comment: [employer]  Tobacco Use   Smoking status: Never    Passive exposure: Never   Smokeless tobacco: Never  Vaping Use   Vaping Use: Never used  Substance and Sexual Activity   Alcohol use: No   Drug use: No   Sexual activity: Not Currently  Other Topics Concern   Not on file  Social History Narrative   Has living will   Designated son [name] and other children to make health care decisions for her   Would accept CPR/resuscitation   No prolonged tube feeds (if cognitively unaware)   Social Determinants of Health   Financial Resource Strain: Low Risk  (08/18/2021)   Overall Financial Resource Strain (CARDIA)    Difficulty of Paying Living Expenses: Not hard at all  Food Insecurity: No Food Insecurity (08/15/2022)   Hunger Vital Sign    Worried About Running Out of Food in the Last  Year: Never true    Ran Out of Food in the Last Year: Never true  Transportation Needs: No Transportation Needs (08/15/2022)   PRAPARE - Administrator, Civil Service (Medical): No    Lack of Transportation (Non-Medical): No  Physical Activity: Insufficiently Active (08/18/2021)   Exercise Vital Sign    Days of Exercise per Week: 4 days    Minutes of Exercise per Session: 10 min  Stress: No Stress Concern Present (08/18/2021)   [employer] of Occupational Health - Occupational Stress Questionnaire    Feeling of Stress : Only a little  Social Connections: Moderately Isolated (08/18/2021)    Social Connection and Isolation Panel [NHANES]    Frequency of Communication with Friends and Family: More than three times a week    Frequency of Social Gatherings with Friends and Family: Never    Attends Religious Services: 1 to 4 times per year    Active Member of [employer] or Organizations: No    Attends Engineer, structural: Never    Marital Status: Divorced     Family History: The patient's family history includes Alcohol abuse in her brother; Heart disease in her brother, father, and sister. There is no history of Breast cancer.  ROS:   Please see the history of present illness.     All other systems reviewed and are negative.  EKGs/Labs/Other Studies Reviewed:    The following studies were reviewed today:   EKG:  EKG is  ordered today.  The ekg ordered today demonstrates normal sinus rhythm, occasional PVCs.  Recent Labs: 09/03/2022: B Natriuretic Peptide 257.3; Hemoglobin 13.4; Platelets 146 10/21/2022: BUN 24; Creatinine, Ser 1.19; Potassium 3.9; Sodium 141  Recent Lipid Panel    Component Value Date/Time   CHOL 185 08/09/2016 1037   TRIG 116.0 08/09/2016 1037   HDL 59.10 08/09/2016 1037   CHOLHDL 3 08/09/2016 1037   VLDL 23.2 08/09/2016 1037   LDLCALC 102 (H) 08/09/2016 1037     Risk Assessment/Calculations:      Physical Exam:    VS:  BP 132/77 (BP Location: Left Arm, Patient Position: Sitting)   Pulse 62   Ht 5\' 4"  (1.626 m)   Wt 277 lb (125.6 kg)   SpO2 95%   BMI 47.55 kg/m     Wt Readings from Last 3 Encounters:  03/24/23 277 lb (125.6 kg)  02/18/23 275 lb (124.7 kg)  01/13/23 275 lb (124.7 kg)     GEN:  Well nourished, well developed in no acute distress HEENT: Normal NECK: No JVD; No carotid bruits CARDIAC: RRR, no murmurs, rubs, gallops RESPIRATORY:  Clear to auscultation without rales, wheezing or rhonchi  ABDOMEN: Soft, non-tender, distended MUSCULOSKELETAL: Leg edema/lymphedema noted SKIN: Warm and dry NEUROLOGIC:  Alert  and oriented x 3 PSYCHIATRIC:  Normal affect   ASSESSMENT:    1. Paroxysmal SVT (supraventricular tachycardia)   2. Primary hypertension   3. Morbid obesity    PLAN:    In order of problems listed above:  Paroxysmal SVT noted during sleep study.  Denies palpitations, dizziness, syncope.  Continue Lopressor 25 mg twice daily.  Echo 10/202 EF 60 to 65%.  Lexiscan Myoview 10/2020. no ischemia. Hypertension, BP controlled.  Continue Lopressor, Lasix. Obesity, low-calorie diet, weight loss advised.    Follow-up in 12 months.   Medication Adjustments/Labs and Tests Ordered: Current medicines are reviewed at length with the patient today.  Concerns regarding medicines are outlined above.  Orders Placed This Encounter  Procedures  EKG 12-Lead   No orders of the defined types were placed in this encounter.   Patient Instructions  Medication Instructions:   Your physician recommends that you continue on your current medications as directed. Please refer to the Current Medication list given to you today.  *If you need a refill on your cardiac medications before your next appointment, please call your pharmacy*   Lab Work:  None Ordered  If you have labs (blood work) drawn today and your tests are completely normal, you will receive your results only by: MyChart Message (if you have MyChart) OR A paper copy in the mail If you have any lab test that is abnormal or we need to change your treatment, we will call you to review the results.   Testing/Procedures:  None Ordered    Follow-Up: At [facility], you and your health needs are our priority.  As part of our continuing mission to provide you with exceptional heart care, we have created designated Provider Care Teams.  These Care Teams include your primary Cardiologist (physician) and Advanced Practice Providers (APPs -  Physician Assistants and Nurse Practitioners) who all work together to provide you with the  care you need, when you need it.  We recommend signing up for the patient portal called "MyChart".  Sign up information is provided on this After Visit Summary.  MyChart is used to connect with patients for Virtual Visits (Telemedicine).  Patients are able to view lab/test results, encounter notes, upcoming appointments, etc.  Non-urgent messages can be sent to your provider as well.   To learn more about what you can do with MyChart, go to [email or web address].    Your next appointment:   12 month(s)  Provider:   You may see Debbe Odea, MD or one of the following Advanced Practice Providers on your designated Care Team:   Nicolasa Ducking, NP Eula Listen, PA-C Cadence Fransico Michael, PA-C Charlsie Quest, NP    Signed, Debbe Odea, MD  03/24/2023 9:11 AM    [facility]

## 2023-03-24 NOTE — Patient Instructions (Signed)
Medication Instructions:   Your physician recommends that you continue on your current medications as directed. Please refer to the Current Medication list given to you today.  *If you need a refill on your cardiac medications before your next appointment, please call your pharmacy*   Lab Work:  None Ordered  If you have labs (blood work) drawn today and your tests are completely normal, you will receive your results only by: MyChart Message (if you have MyChart) OR A paper copy in the mail If you have any lab test that is abnormal or we need to change your treatment, we will call you to review the results.   Testing/Procedures:  None Ordered    Follow-Up: At [facility], you and your health needs are our priority.  As part of our continuing mission to provide you with exceptional heart care, we have created designated Provider Care Teams.  These Care Teams include your primary Cardiologist (physician) and Advanced Practice Providers (APPs -  Physician Assistants and Nurse Practitioners) who all work together to provide you with the care you need, when you need it.  We recommend signing up for the patient portal called "MyChart".  Sign up information is provided on this After Visit Summary.  MyChart is used to connect with patients for Virtual Visits (Telemedicine).  Patients are able to view lab/test results, encounter notes, upcoming appointments, etc.  Non-urgent messages can be sent to your provider as well.   To learn more about what you can do with MyChart, go to [email or web address].    Your next appointment:   12 month(s)  Provider:   You may see Brian Agbor-Etang, MD or one of the following Advanced Practice Providers on your designated Care Team:   Christopher Berge, NP Ryan Dunn, PA-C Cadence Furth, PA-C Sheri Hammock, NP  

## 2023-03-24 NOTE — Progress Notes (Signed)
Subjective:    Patient ID: Autumn Price, female    DOB: 05/19/1941, 82 y.o.   MRN: [id number] Patient Care Team: Karie SchwalbeLetvak, Richard I, MD as PCP - General Debbe OdeaAgbor-Etang, Brian, MD as PCP - Cardiology (Cardiology) Quintella Reicherturner, Traci R, MD as PCP - Sleep Medicine (Cardiology) Salena SanerGonzalez, Ilyanna Baillargeon L, MD as Consulting Physician (Pulmonary Disease)  Chief Complaint  Patient presents with   Follow-up    SOB and wheezing with exertion. Occasional cough with light yellow sputum.    HPI Autumn Price is a 82 year old morbidly obese, lifelong never smoker who presents for follow-up visit on the issue of moderate persistent asthma/asthmatic bronchitis.  I last saw her on 18 February 2023 and at that time she was [facility].  She was also treated for an acute rhinosinusitis with doxycycline.  After her last visit she did much better she states, she states that she stopped wheezing so she discontinued Breztri.  Since then she has noted wheezing when she ambulates.  She has had limited use of levo albuterol as rescue. She has not had any fevers, chills or sweats.  No general malaise.  No arthralgias or myalgias.  Overall she feels well and looks well.  DATA: 10/13/2019 2D echo: LVEF 60 to 65%, essentially normal, no pulmonary hypertension 06/05/2020 overnight oximetry: Shows desaturations to 72% throughout the course of the night with a high oxygen desaturation index/sleep disturbance (24 events per hour) 06/27/2020 PFTs: FEV1 1.86 L or 93% predicted, FVC 2.28 L or 86% predicted.  FEV1/FVC 82%. No bronchodilator response.  Diffusion capacity not performed despite being ordered.  No obstructive defect.  No restrictive defect 10/09/2020 Home sleep study: Desaturations noted during study as low as 81%.  Average of 91%.  AHI was 15.5, consistent with moderate obstructive sleep apnea 10/17/2020 nuclear stress test: Low risk study, sensitivity reduced to the patient's body habitus,  coronary artery calcifications noted on attenuation correction CT. 06/03/2022 chest x-ray PA and lateral: No active cardiopulmonary disease. 02/21/2022 CBC with differential/IgE: CBC normal with no eosinophilia, IgE 8 IU/mL (normal). 02/04/2023 PFTs: FEV1 1.69 L or 90% predicted, FVC 2.09 L or 82% predicted, FEV1/FVC 81%, no significant bronchodilator response, diffusion capacity normal.  Consistent with mild obstructive defect.  Review of Systems A 10 point review of systems was performed and it is as noted above otherwise negative.  Patient Active Problem List   Diagnosis Date Noted   Moderate persistent asthma without complication 01/13/2023   GERD (gastroesophageal reflux disease) 09/03/2022   Hx of sleep apnea 09/03/2022   Eosinophilic bronchitis 06/14/2022   Thrombocytopenia 01/03/2022   Chronic hypoxemic respiratory failure 01/02/2022   Stage 3a chronic kidney disease 01/02/2022   Chronic narcotic dependence 01/02/2022   Persistent cough 05/11/2020   Chronic diastolic heart failure 10/25/2019   DOE (dyspnea on exertion) 10/11/2019   Primary osteoarthritis of right knee 03/20/2018   S/P knee replacement 03/20/2018   Osteoarthritis of left knee 08/30/2016   Advance directive discussed with patient 08/04/2015   Mood disorder 06/10/2014   Morbid obesity 05/22/2012   Routine general medical examination at a health care facility 03/19/2011   Mixed stress and urge urinary incontinence 03/19/2011   Essential hypertension, benign 08/21/2010   Chronic venous insufficiency 08/21/2010   Osteoarthritis, multiple sites 08/21/2010   Social History   Tobacco Use   Smoking status: Never    Passive exposure: Never   Smokeless tobacco: Never  Substance Use Topics   Alcohol use:  No   No Known Allergies Current Meds  Medication Sig   acetaminophen (TYLENOL) 500 MG tablet Take 500 mg by mouth every 6 (six) hours as needed for mild pain or moderate pain.   Budeson-Glycopyrrol-Formoterol  (BREZTRI AEROSPHERE) 160-9-4.8 MCG/ACT AERO Inhale 2 puffs into the lungs in the morning and at bedtime.   budesonide (PULMICORT) 0.5 MG/2ML nebulizer solution Take 2 mLs (0.5 mg total) by nebulization in the morning and at bedtime.   fluticasone (FLONASE) 50 MCG/ACT nasal spray PLACE 1 SPRAY INTO BOTH NOSTRILS DAILY AS NEEDED FOR ALLERGIES OR RHINITIS.   furosemide (LASIX) 20 MG tablet Take 1 tablet (20 mg total) by mouth daily.   ketoconazole (NIZORAL) 2 % cream APPLY 1 APPLICATION TOPICALLY TO GROIN AREA 2 TIMES A DAY AS NEEDED FOR IRRITATION   levalbuterol (XOPENEX HFA) 45 MCG/ACT inhaler Inhale 2 puffs into the lungs every 6 (six) hours as needed for wheezing.   metoprolol tartrate (LOPRESSOR) 25 MG tablet Take 1 tablet (25 mg total) by mouth 2 (two) times daily.   mirabegron ER (MYRBETRIQ) 50 MG TB24 tablet TAKE 1 TABLET ONCE DAILY   montelukast (SINGULAIR) 10 MG tablet Take 1 tablet (10 mg total) by mouth at bedtime.   pantoprazole (PROTONIX) 40 MG tablet Take 1 tablet (40 mg total) by mouth daily.   sertraline (ZOLOFT) 50 MG tablet Take 1 tablet (50 mg total) by mouth daily.   traMADol (ULTRAM) 50 MG tablet Take 1-2 tablets (50-100 mg total) by mouth every 12 (twelve) hours as needed.   Immunization History  Administered Date(s) Administered   Fluad Quad(high Dose 65+) 10/11/2020   Influenza Inj Mdck Quad Pf 10/30/2018, 09/25/2019   Influenza, Seasonal, Injecte, Preservative Fre 09/30/2016   Influenza,inj,Quad PF,6+ Mos 09/12/2017   Influenza-Unspecified 09/12/2021, 09/19/2022   Pneumococcal Conjugate-13 06/10/2014   Pneumococcal Polysaccharide-23 01/16/2009, 08/12/2017   Td 08/21/2010   Tdap 09/19/2022   Zoster Recombinat (Shingrix) 09/30/2019, 01/11/2020   Zoster, Live 09/18/2016       Objective:   Physical Exam BP 122/80 (BP Location: Left Arm, Cuff Size: Large)   Pulse 65   Temp 98 F (36.7 C)   Ht 5\' 4"  (1.626 m)   Wt 277 lb (125.6 kg)   SpO2 96%   BMI 47.55 kg/m    SpO2: 96 % O2 Device: None (Room air)  GENERAL: Morbidly obese woman, no acute distress, fully ambulatory, no conversational dyspnea.  HEAD: Normocephalic, atraumatic.  EYES: Pupils equal, round, reactive to light.  No scleral icterus.  MOUTH: Dentition intact, oral mucosa moist. NECK: Supple. No thyromegaly. Trachea midline. No JVD.  No adenopathy. PULMONARY: Good air entry bilaterally.  No adventitious sounds. CARDIOVASCULAR: S1 and S2. Regular rate and rhythm.  No rubs, murmurs or gallops heard. ABDOMEN: Obese otherwise benign. MUSCULOSKELETAL: No joint deformity, no clubbing, no edema.  NEUROLOGIC: No focal deficit, no gait disturbance, speech is fluent. SKIN: Intact,warm,dry.  Limited exam shows no rashes PSYCH: Mood appropriate, behavior normal.     Assessment & Plan:     ICD-10-CM   1. Moderate persistent asthma without complication  J45.40    Has element of asthmatic bronchitis as well Recommend to be compliant with Breztri 2 puffs twice a day Continue as needed levo albuterol    2. Chronic asthmatic bronchitis  J44.89    Breztri 2 puffs twice a day    3. Shortness of breath  R06.02    Well compensated    4. Morbid obesity  E66.01  This issue adds complexity to her management Recommend weight loss This will add to her poor asthma control and dyspnea     Patient appears to be well compensated.  Recommendations of inhaler use were made.  Follow-up 3 months time or call sooner should any new difficulties arise.  Gailen Shelter, MD Advanced Bronchoscopy PCCM [facility]    *This note was dictated using voice recognition software/Dragon.  Despite best efforts to proofread, errors can occur which can change the meaning. Any transcriptional errors that result from this process are unintentional and may not be fully corrected at the time of dictation.

## 2023-03-25 ENCOUNTER — Ambulatory Visit: Payer: 59 | Admitting: Pulmonary Disease

## 2023-04-13 DIAGNOSIS — G4733 Obstructive sleep apnea (adult) (pediatric): Secondary | ICD-10-CM | POA: Diagnosis not present

## 2023-04-13 DIAGNOSIS — I5032 Chronic diastolic (congestive) heart failure: Secondary | ICD-10-CM | POA: Diagnosis not present

## 2023-04-15 ENCOUNTER — Other Ambulatory Visit: Payer: Self-pay | Admitting: Internal Medicine

## 2023-04-15 NOTE — Telephone Encounter (Signed)
Last filled 03-17-23 #120 Last OV 01-07-23 Next OV 01-09-24 [facility]

## 2023-05-13 ENCOUNTER — Other Ambulatory Visit: Payer: Self-pay | Admitting: Internal Medicine

## 2023-05-13 DIAGNOSIS — I5032 Chronic diastolic (congestive) heart failure: Secondary | ICD-10-CM | POA: Diagnosis not present

## 2023-05-13 DIAGNOSIS — G4733 Obstructive sleep apnea (adult) (pediatric): Secondary | ICD-10-CM | POA: Diagnosis not present

## 2023-05-13 NOTE — Telephone Encounter (Signed)
Last filled 04-15-23 #120 Last OV 01-07-23 Next OV 01-09-24 [facility]

## 2023-06-13 ENCOUNTER — Other Ambulatory Visit: Payer: Self-pay | Admitting: Internal Medicine

## 2023-06-13 ENCOUNTER — Other Ambulatory Visit: Payer: Self-pay | Admitting: Pulmonary Disease

## 2023-06-13 DIAGNOSIS — I5032 Chronic diastolic (congestive) heart failure: Secondary | ICD-10-CM | POA: Diagnosis not present

## 2023-06-13 DIAGNOSIS — G4733 Obstructive sleep apnea (adult) (pediatric): Secondary | ICD-10-CM | POA: Diagnosis not present

## 2023-06-17 NOTE — Telephone Encounter (Signed)
Last filled 05-13-23 #120 Last OV 01-07-23 Next OV 01-09-24 [facility]

## 2023-06-17 NOTE — Telephone Encounter (Signed)
Autumn Price notified by telephone that her Tramadol refill has been sent to [facility].

## 2023-06-17 NOTE — Telephone Encounter (Signed)
Patient contacted the office regarding this request, asked for an update. Advised I would send a message regarding this to get an update for patient, please advise

## 2023-06-23 ENCOUNTER — Telehealth: Payer: Self-pay | Admitting: Internal Medicine

## 2023-06-23 NOTE — Telephone Encounter (Signed)
Called patient states that for last 5 days she has had increased anxiety/depression symptoms. She is not able to sleep and just sits around and crys. Denies any suicidal thoughts. I have set up for follow up in next available with PCP but that is not until Friday. Patient wanted to know if something can be called in for her.

## 2023-06-23 NOTE — Telephone Encounter (Signed)
Patient contacted the office requesting a call back from clinical staff whenever possible. Please advise, patient can be reached at [phone].

## 2023-06-24 NOTE — Telephone Encounter (Signed)
Left message on Vm for pt to call the office and let them know to put her in the 1115 Same Day or 1230 tomorrow (Wednesday July 10)

## 2023-06-24 NOTE — Telephone Encounter (Signed)
Patient returned call,she stated that 1115 tomorrow July 10,2024 would work for her.

## 2023-06-24 NOTE — Telephone Encounter (Signed)
Called pt back since she was not placed on the schedule tomorrow. Had to put her at 1230.

## 2023-06-25 ENCOUNTER — Ambulatory Visit (INDEPENDENT_AMBULATORY_CARE_PROVIDER_SITE_OTHER): Payer: 59 | Admitting: Internal Medicine

## 2023-06-25 ENCOUNTER — Encounter: Payer: Self-pay | Admitting: Internal Medicine

## 2023-06-25 VITALS — BP 138/80 | HR 102 | Temp 98.0°F | Ht 64.0 in | Wt 281.0 lb

## 2023-06-25 DIAGNOSIS — F320 Major depressive disorder, single episode, mild: Secondary | ICD-10-CM | POA: Diagnosis not present

## 2023-06-25 MED ORDER — SERTRALINE HCL 100 MG PO TABS: 100.0000 mg | ORAL_TABLET | Freq: Every day | ORAL | 3 refills | Status: AC

## 2023-06-25 NOTE — Assessment & Plan Note (Signed)
Triggered at first by losing dog in the winter Now grandson has moved out (down the road) Stuck inside with the hot weather No suicidal ideation or psychotic features Will increase the sertraline to 100 mg daily Discussed possible side effects Recheck 3 weeks--consider adding second agent

## 2023-06-25 NOTE — Progress Notes (Signed)
Subjective:    Patient ID: Autumn Price, female    DOB: 15-Jan-1941, 82 y.o.   MRN: [id number]  HPI Here due to recent worsening of depression  Having a hard time Crying when she gets up---may be okay most of the day and then it hits her again Not sleeping well Goes back a month or so--just hoping it would improve Still may go back to losing dog this winter---affecting seeing pictures of dogs on Facebook No energy Makes herself eat breakfast--not hungry otherwise (but still gained weight) Grandson not staying with her--so she is alone more (he is down the road)  No suicidal ideation--and not really thinking of dying Has feeling of low self esteem No sig problems with concentration Has lost interest in hobbies, etc  Mostly goes back to grandson being killed 4 years ago Daughter in law died shortly after that (cancer)  Current Outpatient Medications on File Prior to Visit  Medication Sig Dispense Refill   acetaminophen (TYLENOL) 500 MG tablet Take 500 mg by mouth every 6 (six) hours as needed for mild pain or moderate pain.     Budeson-Glycopyrrol-Formoterol (BREZTRI AEROSPHERE) 160-9-4.8 MCG/ACT AERO Inhale 2 puffs into the lungs in the morning and at bedtime. 10.7 g 3   fluticasone (FLONASE) 50 MCG/ACT nasal spray PLACE 1 SPRAY INTO BOTH NOSTRILS DAILY AS NEEDED FOR ALLERGIES OR RHINITIS. 48 mL 1   ketoconazole (NIZORAL) 2 % cream APPLY 1 APPLICATION TOPICALLY TO GROIN AREA 2 TIMES A DAY AS NEEDED FOR IRRITATION 30 g 3   levalbuterol (XOPENEX HFA) 45 MCG/ACT inhaler Inhale 2 puffs into the lungs every 6 (six) hours as needed for wheezing. 1 each 2   metoprolol tartrate (LOPRESSOR) 25 MG tablet Take 1 tablet (25 mg total) by mouth 2 (two) times daily. 1 tablet 0   mirabegron ER (MYRBETRIQ) 50 MG TB24 tablet TAKE 1 TABLET ONCE DAILY 90 tablet 3   montelukast (SINGULAIR) 10 MG tablet Take 1 tablet (10 mg total) by mouth at bedtime. 30 tablet 11   pantoprazole (PROTONIX) 40 MG tablet  Take 1 tablet (40 mg total) by mouth daily. 30 tablet 0   sertraline (ZOLOFT) 50 MG tablet Take 1 tablet (50 mg total) by mouth daily. 90 tablet 3   traMADol (ULTRAM) 50 MG tablet Take 1-2 tablets (50-100 mg total) by mouth every 12 (twelve) hours as needed. 120 tablet 0   budesonide (PULMICORT) 0.5 MG/2ML nebulizer solution Take 2 mLs (0.5 mg total) by nebulization in the morning and at bedtime. (Patient not taking: Reported on 06/25/2023) 120 mL 5   furosemide (LASIX) 20 MG tablet Take 1 tablet (20 mg total) by mouth daily. 60 tablet 3   No current facility-administered medications on file prior to visit.    No Known Allergies  Past Medical History:  Diagnosis Date   Depression    Hypertension    Osteoarthritis    Urge incontinence    Venous insufficiency     Past Surgical History:  Procedure Laterality Date   APPENDECTOMY  1966   DILATION AND CURETTAGE OF UTERUS  01/27/14   benign   JOINT REPLACEMENT     right knee   TOTAL KNEE ARTHROPLASTY Right 03/20/2018   Procedure: RIGHT TOTAL KNEE ARTHROPLASTY;  Surgeon: Eugenia Mcalpine, MD;  Location: [facility];  Service: Orthopedics;  Laterality: Right;   TOTAL KNEE ARTHROPLASTY Left 09/11/2018   Procedure: LEFT TOTAL KNEE ARTHROPLASTY;  Surgeon: Eugenia Mcalpine, MD;  Location: [facility];  Service: Orthopedics;  Laterality: Left;   TUBAL LIGATION  1966    Family History  Problem Relation Age of Onset   Heart disease Father        died age 35- MI   Alcohol abuse Brother    Heart disease Brother    Heart disease Sister    Breast cancer Neg Hx     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    Comment: [employer]  Tobacco Use   Smoking status: Never    Passive exposure: Never   Smokeless tobacco: Never  Vaping  Use   Vaping Use: Never used  Substance and Sexual Activity   Alcohol use: No   Drug use: No   Sexual activity: Not Currently  Other Topics Concern   Not on file  Social History Narrative   Has living will   Designated son Ronny Flurry and other children to make health care decisions for her   Would accept CPR/resuscitation   No prolonged tube feeds (if cognitively unaware)   Social Determinants of Health   Financial Resource Strain: Low Risk  (08/18/2021)   Overall Financial Resource Strain (CARDIA)    Difficulty of Paying Living Expenses: Not hard at all  Food Insecurity: No Food Insecurity (08/15/2022)   Hunger Vital Sign    Worried About Running Out of Food in the Last Year: Never true    Ran Out of Food in the Last Year: Never true  Transportation Needs: No Transportation Needs (08/15/2022)   PRAPARE - Administrator, Civil Service (Medical): No    Lack of Transportation (Non-Medical): No  Physical Activity: Insufficiently Active (08/18/2021)   Exercise Vital Sign    Days of Exercise per Week: 4 days    Minutes of Exercise per Session: 10 min  Stress: No Stress Concern Present (08/18/2021)   [employer] of Occupational Health - Occupational Stress Questionnaire    Feeling of Stress : Only a little  Social Connections: Moderately Isolated (08/18/2021)   Social Connection and Isolation Panel [NHANES]    Frequency of Communication with Friends and Family: More than three times a week    Frequency of Social Gatherings with Friends and Family: Never    Attends Religious Services: 1 to 4 times per year    Active Member of [employer] or Organizations: No    Attends Banker Meetings: Never    Marital Status: Divorced  Catering manager Violence: Not At Risk (08/18/2021)   Humiliation, Afraid, Rape, and Kick questionnaire    Fear of Current or Ex-Partner: No    Emotionally Abused: No    Physically Abused: No    Sexually Abused: No   Review of Systems Bad  swelling in feet recently---cardiologist gave lasix bid for a while This has improved Staying in more due to the heat with her breathing    Objective:   Physical Exam Constitutional:      Appearance: Normal appearance.  Neurological:     Mental Status: She is alert.  Psychiatric:     Comments: Tearful but normal interaction No apparent hallucinations Insight and judgement do not appear impaired             Assessment & Plan:

## 2023-06-27 ENCOUNTER — Ambulatory Visit: Payer: 59 | Admitting: Internal Medicine

## 2023-06-30 ENCOUNTER — Telehealth: Payer: Self-pay

## 2023-06-30 ENCOUNTER — Encounter: Payer: Self-pay | Admitting: Pulmonary Disease

## 2023-06-30 ENCOUNTER — Ambulatory Visit: Payer: 59 | Attending: Cardiology

## 2023-06-30 ENCOUNTER — Ambulatory Visit (INDEPENDENT_AMBULATORY_CARE_PROVIDER_SITE_OTHER): Payer: 59 | Admitting: Pulmonary Disease

## 2023-06-30 ENCOUNTER — Other Ambulatory Visit: Payer: Self-pay

## 2023-06-30 VITALS — BP 110/70 | HR 76 | Temp 98.0°F | Ht 64.0 in | Wt 278.8 lb

## 2023-06-30 DIAGNOSIS — R0602 Shortness of breath: Secondary | ICD-10-CM | POA: Diagnosis not present

## 2023-06-30 DIAGNOSIS — I471 Supraventricular tachycardia, unspecified: Secondary | ICD-10-CM

## 2023-06-30 DIAGNOSIS — J4489 Other specified chronic obstructive pulmonary disease: Secondary | ICD-10-CM

## 2023-06-30 NOTE — Telephone Encounter (Signed)
Incoming Secure Chat   Salena Saner, MD Autumn Price, this is a mutual patient. She continues to have a lot of dyspnea that I think is mostly related to obesity and deconditioning. However on ambulatory oximetry her heart rate was all over the place despite her oxygen level maintaining at 96%. I wonder if a Zio patch may be reasonable to see what she is doing with her heart rate. She has been noted to have paroxysmal SVT.  9 mins BA Debbe Odea, MD ok. we will mail a monitor to her. she has hx of psvt. if significant arrhythmia is noted, will consider titrating her lopressor. Ty  6 mins You were added by Debbe Odea, MD. 6 mins CG Salena Saner, MD That sounds great.  6 mins Order has been placed for the monitor - it will come to her address on file - she should have it by the end of this week.    4 mins CG Salena Saner, MD Thank you all!   Left detailed message on VM - okay per DPR to make patient aware that we have placed on order for the monitor and that she should have it by the end of this week.

## 2023-06-30 NOTE — Progress Notes (Signed)
Subjective:    Patient ID: Autumn Price, female    DOB: 01/02/1941, 82 y.o.   MRN: [id number]  Patient Care Team: Karie Schwalbe, MD as PCP - General Debbe Odea, MD as PCP - Cardiology (Cardiology) Quintella Reichert, MD as PCP - Sleep Medicine (Cardiology) Salena Saner, MD as Consulting Physician (Pulmonary Disease)  Chief Complaint  Patient presents with   Follow-up    Has had fluid weight, lost weight since Friday.  Sob with minimal activity.    HPI Autumn Price is a 82 year old morbidly obese, lifelong never smoker who presents for follow-up visit on the issue of moderate persistent asthma/asthmatic bronchitis.  I last saw her on 24 March 2023 and at that time she had inexplicably stopped Breztri and her symptoms of wheezing had recurred.  Since then she has resumed Breztri and has not noticed any wheezing. She has had limited use of levo albuterol as rescue. She has not had any fevers, chills or sweats.  No general malaise.  No arthralgias or myalgias.  She is compliant with nocturnal oxygen at 2 L/min and notes benefit from the therapy.  She has had some issues with depression that have been managed by increasing Zoloft by Dr. Alphonsus Sias.  She is now becoming more active after this bout with depression.  She has noted that at time her oximeter at home reads very high heart rate sometimes as high as 180s.  She has not had any chest pain.  No orthopnea or paroxysmal nocturnal dyspnea.  Does have tachypalpitations.  DATA: 10/13/2019 2D echo: LVEF 60 to 65%, essentially normal, no pulmonary hypertension 06/05/2020 overnight oximetry: Shows desaturations to 72% throughout the course of the night with a high oxygen desaturation index/sleep disturbance (24 events per hour) 06/27/2020 PFTs: FEV1 1.86 L or 93% predicted, FVC 2.28 L or 86% predicted.  FEV1/FVC 82%. No bronchodilator response.  Diffusion capacity not performed despite being ordered.  No obstructive defect.  No restrictive  defect 10/09/2020 Home sleep study: Desaturations noted during study as low as 81%.  Average of 91%.  AHI was 15.5, consistent with moderate obstructive sleep apnea 10/17/2020 nuclear stress test: Low risk study, sensitivity reduced to the patient's body habitus, coronary artery calcifications noted on attenuation correction CT. 06/03/2022 chest x-ray PA and lateral: No active cardiopulmonary disease. 02/21/2022 CBC with differential/IgE: CBC normal with no eosinophilia, IgE 8 IU/mL (normal). 02/04/2023 PFTs: FEV1 1.69 L or 90% predicted, FVC 2.09 L or 82% predicted, FEV1/FVC 81%, no significant bronchodilator response, diffusion capacity normal.  Consistent with mild obstructive defect.  Review of Systems A 10 point review of systems was performed and it is as noted above otherwise negative.   Patient Active Problem List   Diagnosis Date Noted   MDD (major depressive disorder), single episode, mild (HCC) 06/25/2023   Moderate persistent asthma without complication 01/13/2023   GERD (gastroesophageal reflux disease) 09/03/2022   Hx of sleep apnea 09/03/2022   Eosinophilic bronchitis 06/14/2022   Thrombocytopenia (HCC) 01/03/2022   Chronic hypoxemic respiratory failure (HCC) 01/02/2022   Stage 3a chronic kidney disease (HCC) 01/02/2022   Chronic narcotic dependence (HCC) 01/02/2022   Persistent cough 05/11/2020   Chronic diastolic heart failure (HCC) 10/25/2019   DOE (dyspnea on exertion) 10/11/2019   Primary osteoarthritis of right knee 03/20/2018   S/P knee replacement 03/20/2018   Osteoarthritis of left knee 08/30/2016   Advance directive discussed with patient 08/04/2015   Mood disorder (HCC) 06/10/2014   Morbid obesity (HCC) 05/22/2012  Routine general medical examination at a health care facility 03/19/2011   Mixed stress and urge urinary incontinence 03/19/2011   Essential hypertension, benign 08/21/2010   Chronic venous insufficiency 08/21/2010   Osteoarthritis, multiple  sites 08/21/2010    Social History   Tobacco Use   Smoking status: Never    Passive exposure: Never   Smokeless tobacco: Never  Substance Use Topics   Alcohol use: No    No Known Allergies  Current Meds  Medication Sig   acetaminophen (TYLENOL) 500 MG tablet Take 500 mg by mouth every 6 (six) hours as needed for mild pain or moderate pain.   Budeson-Glycopyrrol-Formoterol (BREZTRI AEROSPHERE) 160-9-4.8 MCG/ACT AERO Inhale 2 puffs into the lungs in the morning and at bedtime.   budesonide (PULMICORT) 0.5 MG/2ML nebulizer solution Take 2 mLs (0.5 mg total) by nebulization in the morning and at bedtime. (Patient taking differently: Take 0.5 mg by nebulization in the morning and at bedtime. Taking as needed.)   fluticasone (FLONASE) 50 MCG/ACT nasal spray PLACE 1 SPRAY INTO BOTH NOSTRILS DAILY AS NEEDED FOR ALLERGIES OR RHINITIS.   ketoconazole (NIZORAL) 2 % cream APPLY 1 APPLICATION TOPICALLY TO GROIN AREA 2 TIMES A DAY AS NEEDED FOR IRRITATION   levalbuterol (XOPENEX HFA) 45 MCG/ACT inhaler Inhale 2 puffs into the lungs every 6 (six) hours as needed for wheezing.   metoprolol tartrate (LOPRESSOR) 25 MG tablet Take 1 tablet (25 mg total) by mouth 2 (two) times daily.   mirabegron ER (MYRBETRIQ) 50 MG TB24 tablet TAKE 1 TABLET ONCE DAILY   montelukast (SINGULAIR) 10 MG tablet Take 1 tablet (10 mg total) by mouth at bedtime.   pantoprazole (PROTONIX) 40 MG tablet Take 1 tablet (40 mg total) by mouth daily.   sertraline (ZOLOFT) 100 MG tablet Take 1 tablet (100 mg total) by mouth daily.   traMADol (ULTRAM) 50 MG tablet Take 1-2 tablets (50-100 mg total) by mouth every 12 (twelve) hours as needed.    Immunization History  Administered Date(s) Administered   Fluad Quad(high Dose 65+) 10/11/2020   Influenza Inj Mdck Quad Pf 10/30/2018, 09/25/2019   Influenza, Seasonal, Injecte, Preservative Fre 09/30/2016   Influenza,inj,Quad PF,6+ Mos 09/12/2017   Influenza-Unspecified 09/12/2021,  09/19/2022   Pneumococcal Conjugate-13 06/10/2014   Pneumococcal Polysaccharide-23 01/16/2009, 08/12/2017   Td 08/21/2010   Tdap 09/19/2022   Zoster Recombinant(Shingrix) 09/30/2019, 01/11/2020   Zoster, Live 09/18/2016        Objective:     BP 110/70 (BP Location: Left Arm, Patient Position: Sitting, Cuff Size: Large)   Pulse 76   Temp 98 F (36.7 C) (Oral)   Ht 5\' 4"  (1.626 m)   Wt 278 lb 12.8 oz (126.5 kg)   SpO2 93%   BMI 47.86 kg/m   SpO2: 93 % O2 Device: None (Room air)  GENERAL: Morbidly obese woman, no acute distress, fully ambulatory, no conversational dyspnea.  HEAD: Normocephalic, atraumatic.  EYES: Pupils equal, round, reactive to light.  No scleral icterus.  MOUTH: Dentition intact, oral mucosa moist. NECK: Supple. No thyromegaly. Trachea midline. No JVD.  No adenopathy. PULMONARY: Good air entry bilaterally.  No adventitious sounds. CARDIOVASCULAR: S1 and S2. Regular rate and rhythm.  No rubs, murmurs or gallops heard. ABDOMEN: Obese otherwise benign. MUSCULOSKELETAL: No joint deformity, no clubbing, no edema.  NEUROLOGIC: No focal deficit, no gait disturbance, speech is fluent. SKIN: Intact,warm,dry.  Limited exam shows no rashes PSYCH: Mood appropriate, behavior normal.     Ambulatory oximetry was performed today: At rest  on room air oxygen saturation was 100%, the patient ambulated at a slow/moderate pace, completed 3 laps, did stop twice for rest due to fatigue.  O2 nadir 96%, moderate shortness of breath, severe fatigue.  Resting heart rate was 64 bpm at maximum for this exercise 95 bpm however, patient would have episodes of short bursts between 112 to 120 bpm.   Assessment & Plan:     ICD-10-CM   1. Chronic asthmatic bronchitis  J44.89    Continue Breztri 2 puffs twice a day Continue as needed albuterol Well compensated    2. Shortness of breath  R06.02    Mostly fatigue Suspect deconditioning/obesity major issue Query arrhythmia adding to  the issue    3. Paroxysmal SVT (supraventricular tachycardia)  I47.10    Issue adds complexity to her management Will need Zio patch Discussed with cardiology via secure chat    4. Morbid obesity (HCC)  E66.01    Patient would benefit from weight loss Adds to her sensation of fatigue/dyspnea     From the pulmonary standpoint, patient is doing well.  She has multiple issues that to her sensation of fatigue which is her main symptom and some dyspnea.  Suspect obesity and deconditioning are major issues.  In addition she appears to be having some tachyarrhythmias during exercise.  I have discussed with Dr. Debbe Odea via secure chat and the patient will be mailed a Zio patch for arrhythmia monitoring.  She may need titration of her beta-blocker.  Will see the patient in follow-up in 4 months time she is to contact us prior to that time should any new difficulties arise.    Gailen Shelter, MD Advanced Bronchoscopy PCCM [facility]    *This note was dictated using voice recognition software/Dragon.  Despite best efforts to proofread, errors can occur which can change the meaning. Any transcriptional errors that result from this process are unintentional and may not be fully corrected at the time of dictation.

## 2023-06-30 NOTE — Patient Instructions (Signed)
Your oxygen level maintained really well during your walk.  Your heart rate however was very erratic.  I have sent a note to the cardiologist to see if they need to see you sooner.  Continue using your Autumn Price as you are doing.  We will see you in follow-up here in 4 months time call sooner should any new problems arise.

## 2023-07-02 DIAGNOSIS — I471 Supraventricular tachycardia, unspecified: Secondary | ICD-10-CM

## 2023-07-13 DIAGNOSIS — I5032 Chronic diastolic (congestive) heart failure: Secondary | ICD-10-CM | POA: Diagnosis not present

## 2023-07-13 DIAGNOSIS — G4733 Obstructive sleep apnea (adult) (pediatric): Secondary | ICD-10-CM | POA: Diagnosis not present

## 2023-07-16 ENCOUNTER — Other Ambulatory Visit: Payer: Self-pay | Admitting: Family

## 2023-07-16 ENCOUNTER — Ambulatory Visit: Payer: 59 | Admitting: Internal Medicine

## 2023-07-18 DIAGNOSIS — I4719 Other supraventricular tachycardia: Secondary | ICD-10-CM | POA: Diagnosis not present

## 2023-07-22 ENCOUNTER — Ambulatory Visit (INDEPENDENT_AMBULATORY_CARE_PROVIDER_SITE_OTHER): Payer: 59 | Admitting: Internal Medicine

## 2023-07-22 ENCOUNTER — Telehealth: Payer: Self-pay

## 2023-07-22 ENCOUNTER — Encounter: Payer: Self-pay | Admitting: Internal Medicine

## 2023-07-22 VITALS — BP 130/80 | HR 82 | Temp 97.9°F | Ht 64.0 in | Wt 279.0 lb

## 2023-07-22 DIAGNOSIS — F320 Major depressive disorder, single episode, mild: Secondary | ICD-10-CM

## 2023-07-22 DIAGNOSIS — I471 Supraventricular tachycardia, unspecified: Secondary | ICD-10-CM

## 2023-07-22 MED ORDER — METOPROLOL TARTRATE 25 MG PO TABS: 25.0000 mg | ORAL_TABLET | Freq: Two times a day (BID) | ORAL | 0 refills | Status: DC

## 2023-07-22 MED ORDER — TRAMADOL HCL 50 MG PO TABS: 50.0000 mg | ORAL_TABLET | Freq: Two times a day (BID) | ORAL | 0 refills | Status: DC | PRN

## 2023-07-22 NOTE — Assessment & Plan Note (Signed)
No crying since the increased dose Feels much better Will continue the higher sertraline 100mg  --indefinitely at this point

## 2023-07-22 NOTE — Progress Notes (Signed)
Subjective:    Patient ID: Autumn Price, female    DOB: 08-25-1941, 82 y.o.   MRN: [id number]  HPI Here for follow up of depression  Feels a whole lot better than last time Very little sad or depressed feeling No problems on the higher sertraline dos  Falls asleep in living room Goes to bed---then a hard time getting back to sleep Will sleep late if needed This goes back a year or more Energy levels still not great--"gives out easy"  Current Outpatient Medications on File Prior to Visit  Medication Sig Dispense Refill   acetaminophen (TYLENOL) 500 MG tablet Take 500 mg by mouth every 6 (six) hours as needed for mild pain or moderate pain.     Budeson-Glycopyrrol-Formoterol (BREZTRI AEROSPHERE) 160-9-4.8 MCG/ACT AERO Inhale 2 puffs into the lungs in the morning and at bedtime. 10.7 g 3   budesonide (PULMICORT) 0.5 MG/2ML nebulizer solution Take 2 mLs (0.5 mg total) by nebulization in the morning and at bedtime. (Patient taking differently: Take 0.5 mg by nebulization in the morning and at bedtime. Taking as needed.) 120 mL 5   fluticasone (FLONASE) 50 MCG/ACT nasal spray PLACE 1 SPRAY INTO BOTH NOSTRILS DAILY AS NEEDED FOR ALLERGIES OR RHINITIS. 48 mL 1   ketoconazole (NIZORAL) 2 % cream APPLY 1 APPLICATION TOPICALLY TO GROIN AREA 2 TIMES A DAY AS NEEDED FOR IRRITATION 30 g 3   levalbuterol (XOPENEX HFA) 45 MCG/ACT inhaler Inhale 2 puffs into the lungs every 6 (six) hours as needed for wheezing. 1 each 2   metoprolol tartrate (LOPRESSOR) 25 MG tablet Take 1 tablet (25 mg total) by mouth 2 (two) times daily. 1 tablet 0   mirabegron ER (MYRBETRIQ) 50 MG TB24 tablet TAKE 1 TABLET ONCE DAILY 90 tablet 3   montelukast (SINGULAIR) 10 MG tablet Take 1 tablet (10 mg total) by mouth at bedtime. 30 tablet 11   pantoprazole (PROTONIX) 40 MG tablet Take 1 tablet (40 mg total) by mouth daily. 30 tablet 0   sertraline (ZOLOFT) 100 MG tablet Take 1 tablet (100 mg total) by mouth daily. 90 tablet 3    traMADol (ULTRAM) 50 MG tablet Take 1-2 tablets (50-100 mg total) by mouth every 12 (twelve) hours as needed. 120 tablet 0   furosemide (LASIX) 20 MG tablet Take 1 tablet (20 mg total) by mouth daily. 60 tablet 3   No current facility-administered medications on file prior to visit.    No Known Allergies  Past Medical History:  Diagnosis Date   Depression    Hypertension    Osteoarthritis    Urge incontinence    Venous insufficiency     Past Surgical History:  Procedure Laterality Date   APPENDECTOMY  1966   DILATION AND CURETTAGE OF UTERUS  01/27/14   benign   JOINT REPLACEMENT     right knee   TOTAL KNEE ARTHROPLASTY Right 03/20/2018   Procedure: RIGHT TOTAL KNEE ARTHROPLASTY;  Surgeon: Eugenia Mcalpine, MD;  Location: [facility];  Service: Orthopedics;  Laterality: Right;   TOTAL KNEE ARTHROPLASTY Left 09/11/2018   Procedure: LEFT TOTAL KNEE ARTHROPLASTY;  Surgeon: Eugenia Mcalpine, MD;  Location: [facility];  Service: Orthopedics;  Laterality: Left;   TUBAL LIGATION  1966    Family History  Problem Relation Age of Onset   Heart disease Father        died age 23- MI   Alcohol abuse Brother    Heart disease Brother    Heart disease Sister  Breast cancer Neg Hx     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    Comment: [employer]  Tobacco Use   Smoking status: Never    Passive exposure: Never   Smokeless tobacco: Never  Vaping Use   Vaping status: Never Used  Substance and Sexual Activity   Alcohol use: No   Drug use: No   Sexual activity: Not Currently  Other Topics Concern   Not on file  Social History Narrative   Has living will   Designated son Ronny Flurry and other children to make health care decisions for her   Would accept  CPR/resuscitation   No prolonged tube feeds (if cognitively unaware)   Social Determinants of Health   Financial Resource Strain: Low Risk  (08/18/2021)   Overall Financial Resource Strain (CARDIA)    Difficulty of Paying Living Expenses: Not hard at all  Food Insecurity: No Food Insecurity (08/15/2022)   Hunger Vital Sign    Worried About Running Out of Food in the Last Year: Never true    Ran Out of Food in the Last Year: Never true  Transportation Needs: No Transportation Needs (08/15/2022)   PRAPARE - Administrator, Civil Service (Medical): No    Lack of Transportation (Non-Medical): No  Physical Activity: Insufficiently Active (08/18/2021)   Exercise Vital Sign    Days of Exercise per Week: 4 days    Minutes of Exercise per Session: 10 min  Stress: No Stress Concern Present (08/18/2021)   [employer] of Occupational Health - Occupational Stress Questionnaire    Feeling of Stress : Only a little  Social Connections: Moderately Isolated (08/18/2021)   Social Connection and Isolation Panel [NHANES]    Frequency of Communication with Friends and Family: More than three times a week    Frequency of Social Gatherings with Friends and Family: Never    Attends Religious Services: 1 to 4 times per year    Active Member of [employer] or Organizations: No    Attends Banker Meetings: Never    Marital Status: Divorced  Catering manager Violence: Not At Risk (08/18/2021)   Humiliation, Afraid, Rape, and Kick questionnaire    Fear of Current or Ex-Partner: No    Emotionally Abused: No    Physically Abused: No    Sexually Abused: No   Review of Systems Appetite is still okay     Objective:   Physical Exam Constitutional:      Appearance: Normal appearance.  Neurological:     Mental Status: She is alert.  Psychiatric:        Mood and Affect: Mood normal.        Behavior: Behavior normal.            Assessment & Plan:

## 2023-07-22 NOTE — Telephone Encounter (Signed)
-----   Message from Debbe Odea sent at 07/22/2023  8:46 AM EDT ----- Paroxysmal SVT is noted. Please increase Lopressor to 37.5 mg twice daily.  Obtain echocardiogram. Schedule appointment with EP for any additional input.

## 2023-07-24 ENCOUNTER — Ambulatory Visit: Payer: 59 | Attending: Cardiology

## 2023-07-24 DIAGNOSIS — I083 Combined rheumatic disorders of mitral, aortic and tricuspid valves: Secondary | ICD-10-CM | POA: Diagnosis not present

## 2023-07-24 DIAGNOSIS — I471 Supraventricular tachycardia, unspecified: Secondary | ICD-10-CM

## 2023-07-24 LAB — ECHOCARDIOGRAM COMPLETE
Area-P 1/2: 3.6 cm2
Calc EF: 53.8 %
S' Lateral: 3.3 cm
Single Plane A2C EF: 53.8 %
Single Plane A4C EF: 52.5 %

## 2023-08-06 ENCOUNTER — Telehealth: Payer: Self-pay | Admitting: Internal Medicine

## 2023-08-06 NOTE — Telephone Encounter (Signed)
Peace from [facility] called to report that she went to see patient she did  a PAD test and patient does  have mild blockages in her left leg.

## 2023-08-07 NOTE — Telephone Encounter (Signed)
I don't necessarily trust their home tests---but we can discuss this at her next visit

## 2023-08-13 DIAGNOSIS — J9611 Chronic respiratory failure with hypoxia: Secondary | ICD-10-CM | POA: Diagnosis not present

## 2023-08-13 DIAGNOSIS — J40 Bronchitis, not specified as acute or chronic: Secondary | ICD-10-CM | POA: Diagnosis not present

## 2023-08-13 DIAGNOSIS — I5032 Chronic diastolic (congestive) heart failure: Secondary | ICD-10-CM | POA: Diagnosis not present

## 2023-08-13 DIAGNOSIS — G4733 Obstructive sleep apnea (adult) (pediatric): Secondary | ICD-10-CM | POA: Diagnosis not present

## 2023-08-20 ENCOUNTER — Other Ambulatory Visit: Payer: Self-pay | Admitting: Internal Medicine

## 2023-09-01 ENCOUNTER — Ambulatory Visit: Payer: 59 | Admitting: Cardiology

## 2023-09-07 NOTE — Progress Notes (Unsigned)
Electrophysiology Office Note:    Date:  09/09/2023   ID:  Autumn Price, Autumn Price May 30, 1941, MRN [id number]  [facility] Cardiologist:  Debbe Odea, MD  [facility] Electrophysiologist:  Lanier Prude, MD   Referring MD: Debbe Odea, MD   Chief Complaint: SVT  History of Present Illness:    Autumn Price is a 82 y.o. femalewho I am seeing today for an evaluation of SVT at the request of Dr Azucena Cecil.  The patient was last seen by Dr Azucena Cecil on 03/24/2023.  The patient has a medical history that includes HTN, SVT, OSA, obesity. Her SVT was noted during sleep previously and was asymptomatic.   During a pulmonary work up, erratic heart rates were noted. A zio monitor was ordered that showed paroxysmal SVT. The longest episode was 11 minutes with average rates in the 120s. Her metoprolol was increased to 37.5mg  PO BID.   She is doing well today.  She tells me that she can feel her palpitations but she is still able to do her daily activities despite them.  The longest they have her last is between 10 and 15 minutes but typically they last just a few seconds at a time.  She knows she needs to lose weight.  She is having a lot of problems losing weight.  She has gained around 20 pounds in the last 1 to 2 years.     Their past medical, social and family history was reveiwed.   ROS:   Please see the history of present illness.    All other systems reviewed and are negative.  EKGs/Labs/Other Studies Reviewed:    The following studies were reviewed today:  07/22/2023 Zio 2957 SVT episodes, longest 11 minutes Occasional supraventricular ectopy  07/24/2023 echo EF 55 RV normal Mild MR        Physical Exam:    VS:  BP 136/68   Pulse (!) 59   Ht 5\' 4"  (1.626 m)   Wt 279 lb (126.6 kg)   SpO2 96%   BMI 47.89 kg/m     Wt Readings from Last 3 Encounters:  09/09/23 279 lb (126.6 kg)  07/22/23 279 lb (126.6 kg)  06/30/23 278 lb 12.8 oz (126.5 kg)      GEN:  Well nourished, well developed in no acute distress.  Morbidly obese CARDIAC: RRR, no murmurs, rubs, gallops RESPIRATORY:  Clear to auscultation without rales, wheezing or rhonchi       ASSESSMENT AND PLAN:    1. SVT (supraventricular tachycardia)   2. Morbid obesity (HCC)   3. Primary hypertension     #SVT Suspect her SVT is being driven by her pulmonary disease/obesity-hypoventilation syndrome Currently on metoprolol tartrate.  I would like her to stop metoprolol tartrate and start metoprolol succinate 37.5 mg by mouth twice daily.  #Hypertension At goal today.  Recommend checking blood pressures 1-2 times per week at home and recording the values.  Recommend bringing these recordings to the primary care physician.  #Obesity Her obesity is contributing to her multiple medical conditions including obesity hypoventilation syndrome, supraventricular tachycardia, fatigue, joint pain.  I believe that she would feel significantly better and also achieve better health status if she were to lose a significant amount of weight.  Unguinal refer her to our Pharm.D. clinic to consider weight loss medications and for further discussion.  She is very open to this idea.  Follow-up with EP on an as-needed basis.    Signed, Rossie Muskrat. Lalla Brothers,  MD, [facility], [facility] 09/09/2023 11:26 AM    Electrophysiology [facility]

## 2023-09-09 ENCOUNTER — Ambulatory Visit: Payer: 59 | Attending: Cardiology | Admitting: Cardiology

## 2023-09-09 ENCOUNTER — Encounter: Payer: Self-pay | Admitting: Cardiology

## 2023-09-09 VITALS — BP 136/68 | HR 59 | Ht 64.0 in | Wt 279.0 lb

## 2023-09-09 DIAGNOSIS — I10 Essential (primary) hypertension: Secondary | ICD-10-CM

## 2023-09-09 DIAGNOSIS — I471 Supraventricular tachycardia, unspecified: Secondary | ICD-10-CM | POA: Diagnosis not present

## 2023-09-09 MED ORDER — METOPROLOL SUCCINATE ER 25 MG PO TB24: 37.5000 mg | ORAL_TABLET | Freq: Two times a day (BID) | ORAL | 3 refills | Status: AC

## 2023-09-09 NOTE — Patient Instructions (Addendum)
Medication Instructions:  Your physician has recommended you make the following change in your medication:   ** Stop Metoprolol Tartrate  ** Begin Metoprolol Succinate 25mg  - 1 and 1/2 tablets by mouth twice daily.  *If you need a refill on your cardiac medications before your next appointment, please call your pharmacy*   Lab Work: None ordered.  If you have labs (blood work) drawn today and your tests are completely normal, you will receive your results only by: MyChart Message (if you have MyChart) OR A paper copy in the mail If you have any lab test that is abnormal or we need to change your treatment, we will call you to review the results.   Testing/Procedures: None ordered.    Follow-Up: At [facility], you and your health needs are our priority.  As part of our continuing mission to provide you with exceptional heart care, we have created designated Provider Care Teams.  These Care Teams include your primary Cardiologist (physician) and Advanced Practice Providers (APPs -  Physician Assistants and Nurse Practitioners) who all work together to provide you with the care you need, when you need it.  We recommend signing up for the patient portal called "MyChart".  Sign up information is provided on this After Visit Summary.  MyChart is used to connect with patients for Virtual Visits (Telemedicine).  Patients are able to view lab/test results, encounter notes, upcoming appointments, etc.  Non-urgent messages can be sent to your provider as well.   To learn more about what you can do with MyChart, go to [email or web address].    Your next appointment:   Follow up with Dr Lalla Brothers as needed  Other Instructions You have been referred to our pharm-D clinic to start a GLP-1 medications

## 2023-09-13 DIAGNOSIS — J40 Bronchitis, not specified as acute or chronic: Secondary | ICD-10-CM | POA: Diagnosis not present

## 2023-09-13 DIAGNOSIS — I5032 Chronic diastolic (congestive) heart failure: Secondary | ICD-10-CM | POA: Diagnosis not present

## 2023-09-13 DIAGNOSIS — J9611 Chronic respiratory failure with hypoxia: Secondary | ICD-10-CM | POA: Diagnosis not present

## 2023-09-13 DIAGNOSIS — G4733 Obstructive sleep apnea (adult) (pediatric): Secondary | ICD-10-CM | POA: Diagnosis not present

## 2023-09-14 IMAGING — CR DG CHEST 2V
2 series · 2 of 2 positions shown · non-contrast
Comparison: July 06, 2021

CLINICAL DATA: Dyspnea.

EXAM:
CHEST - 2 VIEW

[chest pa]
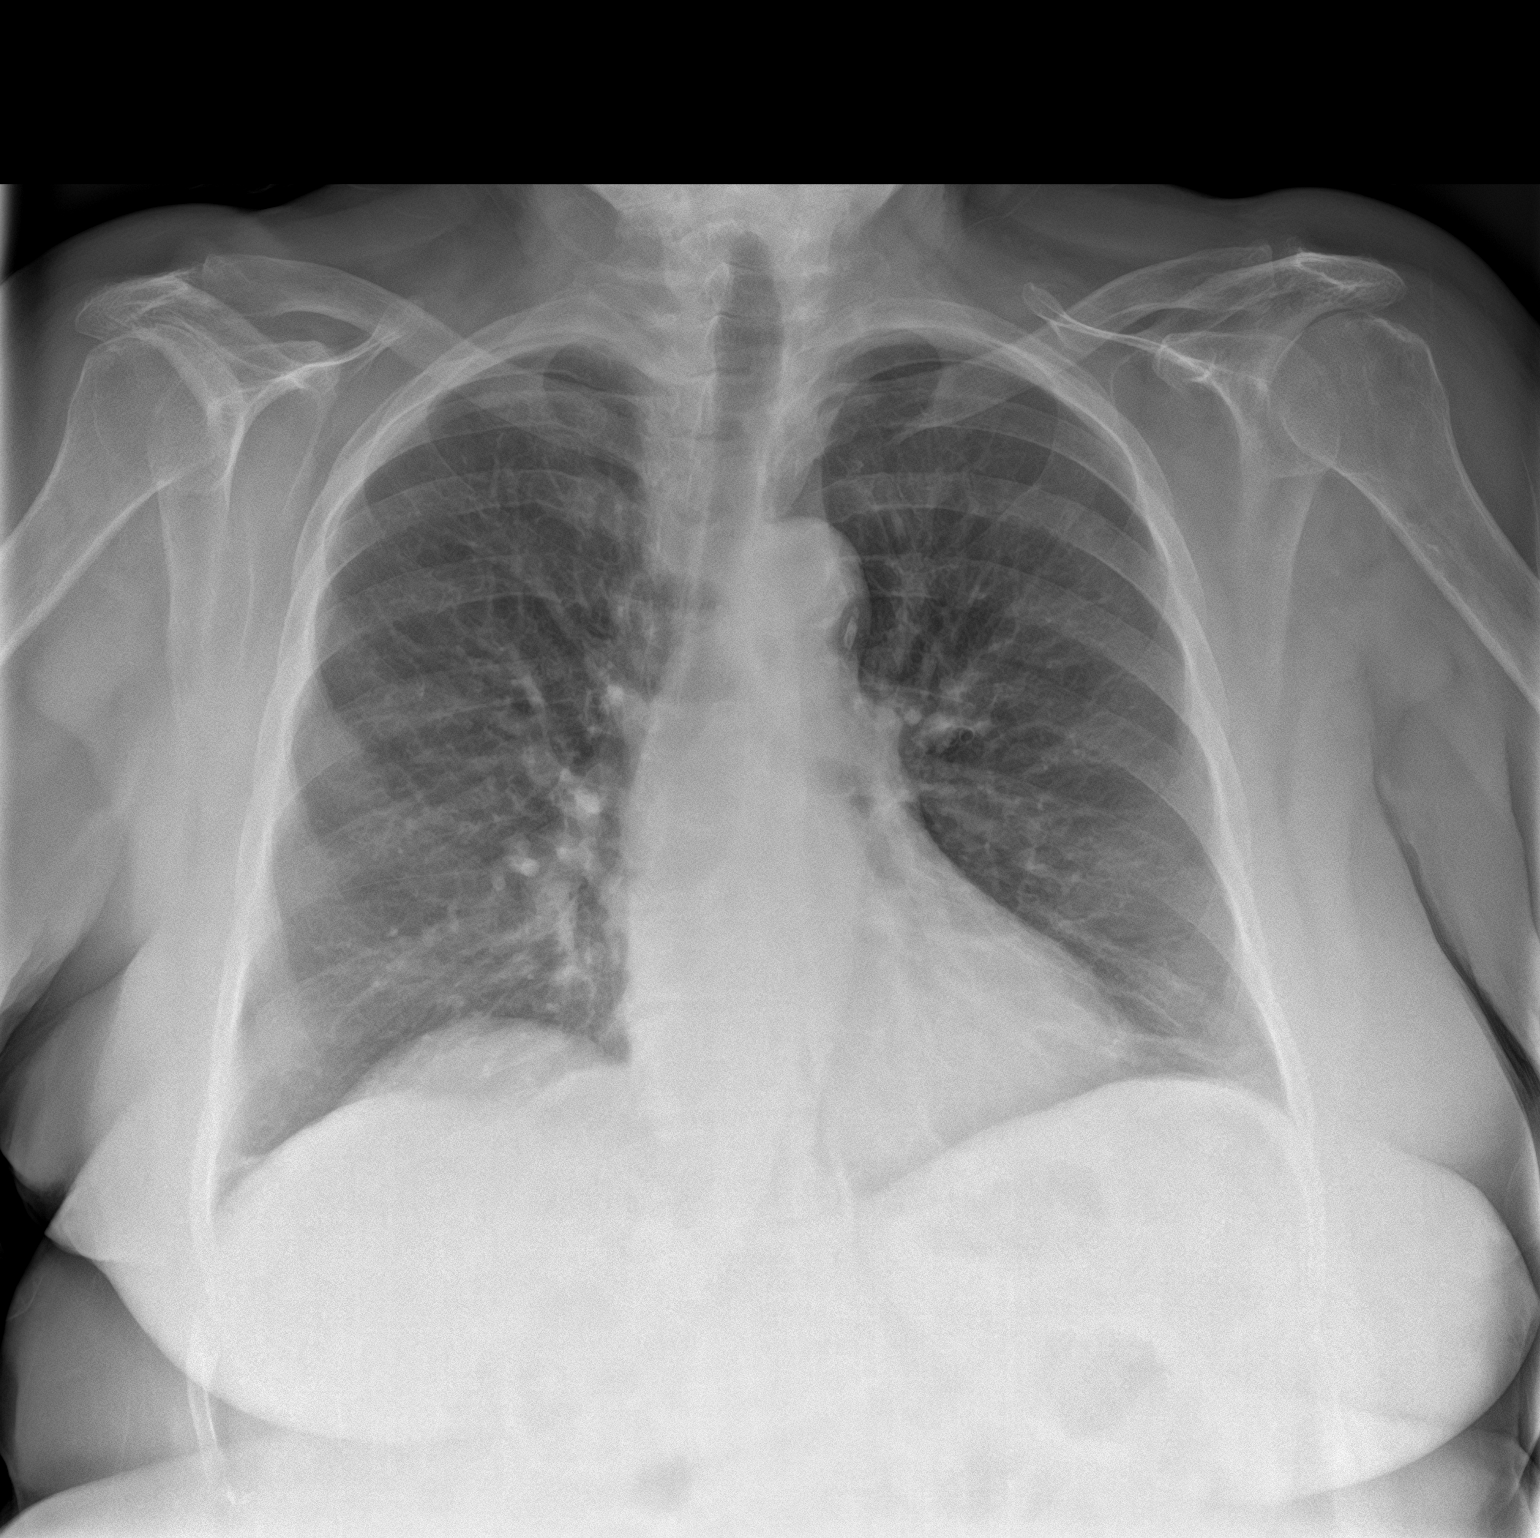

[chest lat]
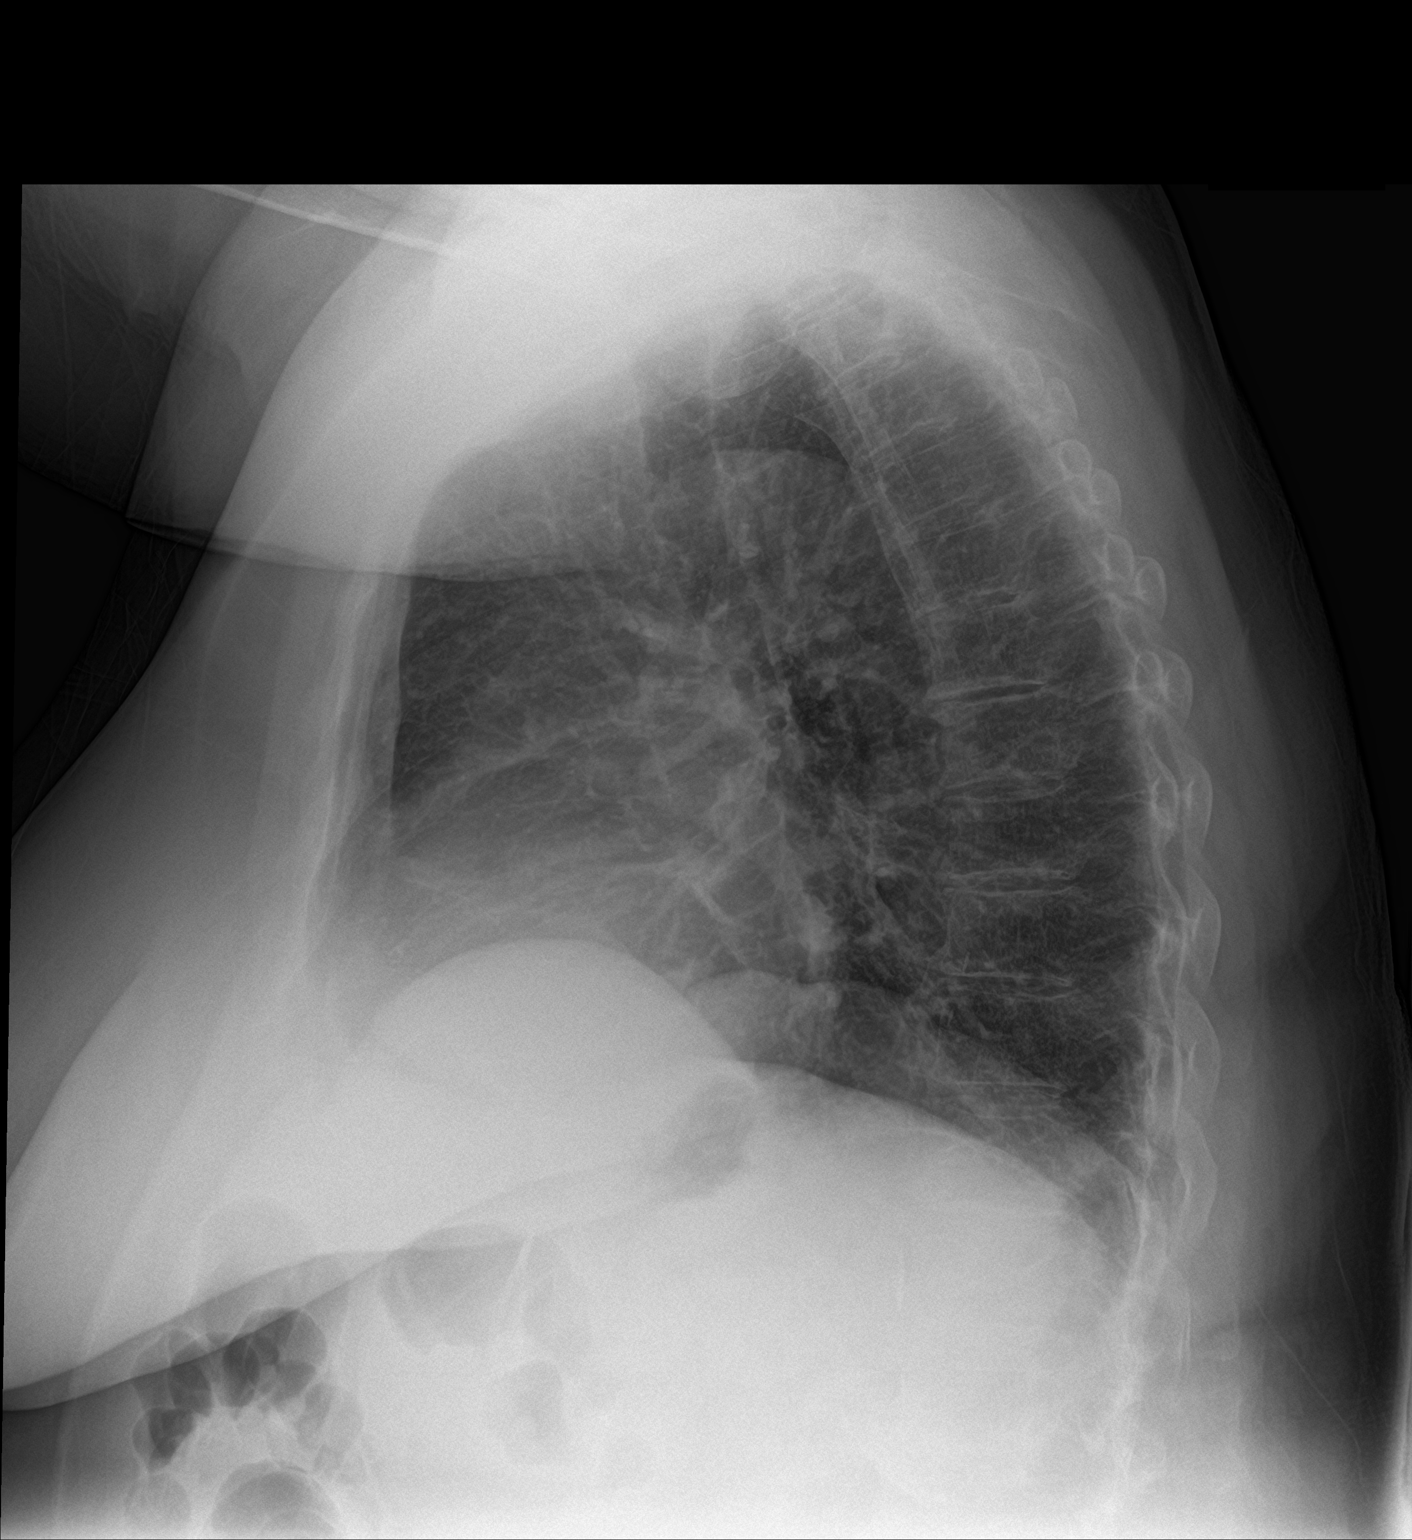

[2 of 2 positions shown; findings below may reference images not displayed]

FINDINGS: Very mild linear atelectasis is seen within the bilateral lung
bases. There is no evidence of acute infiltrate, pleural effusion or
pneumothorax. The heart size and mediastinal contours are within
normal limits. Degenerative changes are seen throughout the thoracic
spine.
IMPRESSION: No active cardiopulmonary disease.

## 2023-09-19 ENCOUNTER — Other Ambulatory Visit: Payer: Self-pay | Admitting: Internal Medicine

## 2023-09-19 ENCOUNTER — Other Ambulatory Visit: Payer: Self-pay | Admitting: Pulmonary Disease

## 2023-09-19 NOTE — Telephone Encounter (Signed)
She has GERD and Beth had recommended she continue the therapy.  Would give just 1 refill but asked her to get further refills from Dr. Alphonsus Sias her primary care physician.

## 2023-09-19 NOTE — Telephone Encounter (Signed)
Dr. Jayme Cloud, please advise if okay to refill protonix? Thanks

## 2023-09-19 NOTE — Telephone Encounter (Signed)
Left detailed message for patient.  1 month supply of protonix has been sent to preferred pharmacy. Nothing further needed.

## 2023-10-02 ENCOUNTER — Other Ambulatory Visit (HOSPITAL_COMMUNITY): Payer: Self-pay

## 2023-10-06 ENCOUNTER — Ambulatory Visit: Payer: 59

## 2023-10-13 DIAGNOSIS — I5032 Chronic diastolic (congestive) heart failure: Secondary | ICD-10-CM | POA: Diagnosis not present

## 2023-10-13 DIAGNOSIS — J40 Bronchitis, not specified as acute or chronic: Secondary | ICD-10-CM | POA: Diagnosis not present

## 2023-10-13 DIAGNOSIS — J9611 Chronic respiratory failure with hypoxia: Secondary | ICD-10-CM | POA: Diagnosis not present

## 2023-10-13 DIAGNOSIS — G4733 Obstructive sleep apnea (adult) (pediatric): Secondary | ICD-10-CM | POA: Diagnosis not present

## 2023-10-15 ENCOUNTER — Other Ambulatory Visit: Payer: Self-pay | Admitting: Internal Medicine

## 2023-10-20 ENCOUNTER — Telehealth: Payer: Self-pay | Admitting: Pharmacist

## 2023-10-20 NOTE — Telephone Encounter (Signed)
Call to reschedule Nov 5 appointment. We will initiate PA for [facility].appointment rescheduled for Nov 25 at 3:30

## 2023-10-21 ENCOUNTER — Telehealth: Payer: Self-pay | Admitting: Pharmacy Technician

## 2023-10-21 ENCOUNTER — Ambulatory Visit: Payer: 59

## 2023-10-21 ENCOUNTER — Other Ambulatory Visit (HOSPITAL_COMMUNITY): Payer: Self-pay

## 2023-10-21 NOTE — Telephone Encounter (Signed)
Pharmacy Patient Advocate Encounter   Received notification from Pt Calls Messages that prior authorization for wegovy is required/requested.   Insurance verification completed.   The patient is insured through [facility] .   Per test claim: PA required; PA submitted to above mentioned insurance via CoverMyMeds Key/confirmation #/EOC [id number] Status is pending

## 2023-10-22 ENCOUNTER — Other Ambulatory Visit (HOSPITAL_COMMUNITY): Payer: Self-pay

## 2023-10-23 ENCOUNTER — Institutional Professional Consult (permissible substitution): Payer: Self-pay | Admitting: Cardiovascular Disease

## 2023-10-23 DIAGNOSIS — R0602 Shortness of breath: Secondary | ICD-10-CM | POA: Diagnosis not present

## 2023-10-23 DIAGNOSIS — I4892 Unspecified atrial flutter: Secondary | ICD-10-CM | POA: Diagnosis not present

## 2023-10-23 DIAGNOSIS — R609 Edema, unspecified: Secondary | ICD-10-CM | POA: Diagnosis not present

## 2023-10-28 ENCOUNTER — Other Ambulatory Visit (HOSPITAL_COMMUNITY): Payer: Self-pay

## 2023-10-30 DIAGNOSIS — R0602 Shortness of breath: Secondary | ICD-10-CM | POA: Diagnosis not present

## 2023-10-30 DIAGNOSIS — R609 Edema, unspecified: Secondary | ICD-10-CM | POA: Diagnosis not present

## 2023-10-30 DIAGNOSIS — I4892 Unspecified atrial flutter: Secondary | ICD-10-CM | POA: Diagnosis not present

## 2023-11-04 ENCOUNTER — Other Ambulatory Visit (HOSPITAL_COMMUNITY): Payer: Self-pay

## 2023-11-05 ENCOUNTER — Other Ambulatory Visit: Payer: Self-pay

## 2023-11-05 DIAGNOSIS — R0689 Other abnormalities of breathing: Secondary | ICD-10-CM | POA: Diagnosis not present

## 2023-11-05 DIAGNOSIS — Z5321 Procedure and treatment not carried out due to patient leaving prior to being seen by health care provider: Secondary | ICD-10-CM | POA: Insufficient documentation

## 2023-11-05 DIAGNOSIS — I10 Essential (primary) hypertension: Secondary | ICD-10-CM | POA: Diagnosis not present

## 2023-11-05 DIAGNOSIS — R5383 Other fatigue: Secondary | ICD-10-CM | POA: Insufficient documentation

## 2023-11-05 NOTE — ED Triage Notes (Signed)
EMS brings pt in from home for c/o "fatigue"

## 2023-11-06 ENCOUNTER — Other Ambulatory Visit: Payer: Self-pay

## 2023-11-06 ENCOUNTER — Observation Stay: Payer: 59

## 2023-11-06 ENCOUNTER — Emergency Department
Admission: EM | Admit: 2023-11-06 | Discharge: 2023-11-06 | Discharge status: Against Medical Advice | Payer: 59 | Attending: Emergency Medicine | Admitting: Emergency Medicine

## 2023-11-06 ENCOUNTER — Encounter: Payer: Self-pay | Admitting: Emergency Medicine

## 2023-11-06 ENCOUNTER — Emergency Department: Payer: 59

## 2023-11-06 ENCOUNTER — Observation Stay
Admission: EM | Admit: 2023-11-06 | Discharge: 2023-11-07 | Disposition: A | Discharge status: Home / Self Care | Payer: 59 | Attending: Internal Medicine | Admitting: Internal Medicine

## 2023-11-06 DIAGNOSIS — Z79899 Other long term (current) drug therapy: Secondary | ICD-10-CM | POA: Insufficient documentation

## 2023-11-06 DIAGNOSIS — R471 Dysarthria and anarthria: Principal | ICD-10-CM | POA: Diagnosis present

## 2023-11-06 DIAGNOSIS — I471 Supraventricular tachycardia, unspecified: Secondary | ICD-10-CM | POA: Diagnosis not present

## 2023-11-06 DIAGNOSIS — J9611 Chronic respiratory failure with hypoxia: Secondary | ICD-10-CM | POA: Diagnosis not present

## 2023-11-06 DIAGNOSIS — I5032 Chronic diastolic (congestive) heart failure: Secondary | ICD-10-CM | POA: Diagnosis not present

## 2023-11-06 DIAGNOSIS — J3489 Other specified disorders of nose and nasal sinuses: Secondary | ICD-10-CM | POA: Diagnosis not present

## 2023-11-06 DIAGNOSIS — I130 Hypertensive heart and chronic kidney disease with heart failure and stage 1 through stage 4 chronic kidney disease, or unspecified chronic kidney disease: Secondary | ICD-10-CM | POA: Diagnosis not present

## 2023-11-06 DIAGNOSIS — Z6841 Body Mass Index (BMI) 40.0 and over, adult: Secondary | ICD-10-CM | POA: Diagnosis present

## 2023-11-06 DIAGNOSIS — D696 Thrombocytopenia, unspecified: Secondary | ICD-10-CM | POA: Diagnosis present

## 2023-11-06 DIAGNOSIS — I6522 Occlusion and stenosis of left carotid artery: Secondary | ICD-10-CM | POA: Diagnosis not present

## 2023-11-06 DIAGNOSIS — I10 Essential (primary) hypertension: Secondary | ICD-10-CM | POA: Diagnosis present

## 2023-11-06 DIAGNOSIS — J4540 Moderate persistent asthma, uncomplicated: Secondary | ICD-10-CM | POA: Diagnosis present

## 2023-11-06 DIAGNOSIS — J45909 Unspecified asthma, uncomplicated: Secondary | ICD-10-CM | POA: Diagnosis not present

## 2023-11-06 DIAGNOSIS — F1120 Opioid dependence, uncomplicated: Secondary | ICD-10-CM | POA: Diagnosis present

## 2023-11-06 DIAGNOSIS — R29818 Other symptoms and signs involving the nervous system: Secondary | ICD-10-CM | POA: Diagnosis not present

## 2023-11-06 DIAGNOSIS — Z7901 Long term (current) use of anticoagulants: Secondary | ICD-10-CM

## 2023-11-06 DIAGNOSIS — E66813 Obesity, class 3: Secondary | ICD-10-CM | POA: Diagnosis present

## 2023-11-06 DIAGNOSIS — N1831 Chronic kidney disease, stage 3a: Secondary | ICD-10-CM | POA: Diagnosis present

## 2023-11-06 DIAGNOSIS — Z1152 Encounter for screening for COVID-19: Secondary | ICD-10-CM | POA: Insufficient documentation

## 2023-11-06 DIAGNOSIS — R4701 Aphasia: Secondary | ICD-10-CM | POA: Diagnosis not present

## 2023-11-06 DIAGNOSIS — Z96653 Presence of artificial knee joint, bilateral: Secondary | ICD-10-CM | POA: Insufficient documentation

## 2023-11-06 DIAGNOSIS — I6782 Cerebral ischemia: Secondary | ICD-10-CM | POA: Diagnosis not present

## 2023-11-06 DIAGNOSIS — R0602 Shortness of breath: Secondary | ICD-10-CM | POA: Diagnosis not present

## 2023-11-06 DIAGNOSIS — R000 Tachycardia, unspecified: Secondary | ICD-10-CM | POA: Diagnosis not present

## 2023-11-06 DIAGNOSIS — R4789 Other speech disturbances: Principal | ICD-10-CM

## 2023-11-06 LAB — DIFFERENTIAL
Abs Immature Granulocytes: 0.02 10*3/uL (ref 0.00–0.07)
Basophils Absolute: 0.1 10*3/uL (ref 0.0–0.1)
Basophils Relative: 2 %
Eosinophils Absolute: 0.3 10*3/uL (ref 0.0–0.5)
Eosinophils Relative: 6 %
Immature Granulocytes: 0 %
Lymphocytes Relative: 25 %
Lymphs Abs: 1.1 10*3/uL (ref 0.7–4.0)
Monocytes Absolute: 0.4 10*3/uL (ref 0.1–1.0)
Monocytes Relative: 9 %
Neutro Abs: 2.6 10*3/uL (ref 1.7–7.7)
Neutrophils Relative %: 58 %
Smear Review: UNDETERMINED

## 2023-11-06 LAB — COMPREHENSIVE METABOLIC PANEL
ALT: 10 U/L (ref 0–44)
ALT: 12 U/L (ref 0–44)
AST: 17 U/L (ref 15–41)
AST: 22 U/L (ref 15–41)
Albumin: 4.1 g/dL (ref 3.5–5.0)
Albumin: 4.3 g/dL (ref 3.5–5.0)
Alkaline Phosphatase: 101 U/L (ref 38–126)
Alkaline Phosphatase: 92 U/L (ref 38–126)
Anion gap: 11 (ref 5–15)
Anion gap: 8 (ref 5–15)
BUN: 25 mg/dL — ABNORMAL HIGH (ref 8–23)
BUN: 28 mg/dL — ABNORMAL HIGH (ref 8–23)
CO2: 22 mmol/L (ref 22–32)
CO2: 27 mmol/L (ref 22–32)
Calcium: 9.4 mg/dL (ref 8.9–10.3)
Calcium: 9.6 mg/dL (ref 8.9–10.3)
Chloride: 101 mmol/L (ref 98–111)
Chloride: 104 mmol/L (ref 98–111)
Creatinine, Ser: 0.95 mg/dL (ref 0.44–1.00)
Creatinine, Ser: 1.09 mg/dL — ABNORMAL HIGH (ref 0.44–1.00)
GFR, Estimated: 51 mL/min — ABNORMAL LOW (ref >60)
GFR, Estimated: >60 mL/min (ref >60)
Glucose, Bld: 101 mg/dL — ABNORMAL HIGH (ref 70–99)
Glucose, Bld: 135 mg/dL — ABNORMAL HIGH (ref 70–99)
Potassium: 4.2 mmol/L (ref 3.5–5.1)
Potassium: 4.5 mmol/L (ref 3.5–5.1)
Sodium: 136 mmol/L (ref 135–145)
Sodium: 137 mmol/L (ref 135–145)
Total Bilirubin: 0.3 mg/dL (ref <1.2)
Total Bilirubin: 1.1 mg/dL (ref <1.2)
Total Protein: 7.5 g/dL (ref 6.5–8.1)
Total Protein: 7.8 g/dL (ref 6.5–8.1)

## 2023-11-06 LAB — CBC
HCT: 48.5 % — ABNORMAL HIGH (ref 36.0–46.0)
Hemoglobin: 15.1 g/dL — ABNORMAL HIGH (ref 12.0–15.0)
MCH: 28.5 pg (ref 26.0–34.0)
MCHC: 31.1 g/dL (ref 30.0–36.0)
MCV: 91.5 fL (ref 80.0–100.0)
Platelets: 118 10*3/uL — ABNORMAL LOW (ref 150–400)
RBC: 5.3 MIL/uL — ABNORMAL HIGH (ref 3.87–5.11)
RDW: 13.2 % (ref 11.5–15.5)
WBC: 4.5 10*3/uL (ref 4.0–10.5)
nRBC: 0 % (ref 0.0–0.2)

## 2023-11-06 LAB — CBC WITH DIFFERENTIAL/PLATELET
Abs Immature Granulocytes: 0.01 10*3/uL (ref 0.00–0.07)
Basophils Absolute: 0.1 10*3/uL (ref 0.0–0.1)
Basophils Relative: 1 %
Eosinophils Absolute: 0.3 10*3/uL (ref 0.0–0.5)
Eosinophils Relative: 6 %
HCT: 47.1 % — ABNORMAL HIGH (ref 36.0–46.0)
Hemoglobin: 15 g/dL (ref 12.0–15.0)
Immature Granulocytes: 0 %
Lymphocytes Relative: 28 %
Lymphs Abs: 1.5 10*3/uL (ref 0.7–4.0)
MCH: 28.8 pg (ref 26.0–34.0)
MCHC: 31.8 g/dL (ref 30.0–36.0)
MCV: 90.6 fL (ref 80.0–100.0)
Monocytes Absolute: 0.5 10*3/uL (ref 0.1–1.0)
Monocytes Relative: 10 %
Neutro Abs: 3.1 10*3/uL (ref 1.7–7.7)
Neutrophils Relative %: 55 %
Platelets: 152 10*3/uL (ref 150–400)
RBC: 5.2 MIL/uL — ABNORMAL HIGH (ref 3.87–5.11)
RDW: 13.2 % (ref 11.5–15.5)
WBC: 5.5 10*3/uL (ref 4.0–10.5)
nRBC: 0 % (ref 0.0–0.2)

## 2023-11-06 LAB — PROTIME-INR
INR: 1.3 — ABNORMAL HIGH (ref 0.8–1.2)
Prothrombin Time: 16.3 s — ABNORMAL HIGH (ref 11.4–15.2)

## 2023-11-06 LAB — APTT: aPTT: 44 s — ABNORMAL HIGH (ref 24–36)

## 2023-11-06 LAB — ETHANOL: Alcohol, Ethyl (B): <10 mg/dL (ref <10)

## 2023-11-06 LAB — SARS CORONAVIRUS 2 BY RT PCR: SARS Coronavirus 2 by RT PCR: NEGATIVE

## 2023-11-06 LAB — HEMOGLOBIN A1C
Hgb A1c MFr Bld: 5.6 % (ref 4.8–5.6)
Mean Plasma Glucose: 114.02 mg/dL

## 2023-11-06 LAB — TROPONIN I (HIGH SENSITIVITY): Troponin I (High Sensitivity): 8 ng/L (ref <18)

## 2023-11-06 MED ORDER — MIRABEGRON ER 50 MG PO TB24
50.0000 mg | ORAL_TABLET | Freq: Every day | ORAL | Status: DC
  Administered 2023-11-06 – 2023-11-07 (×2): 50 mg via ORAL
  Filled 2023-11-06 (×2): qty 1

## 2023-11-06 MED ORDER — MONTELUKAST SODIUM 10 MG PO TABS
10.0000 mg | ORAL_TABLET | Freq: Every day | ORAL | Status: DC
  Administered 2023-11-06: 10 mg via ORAL
  Filled 2023-11-06: qty 1

## 2023-11-06 MED ORDER — FLUTICASONE FUROATE-VILANTEROL 100-25 MCG/ACT IN AEPB
1.0000 | INHALATION_SPRAY | Freq: Every day | RESPIRATORY_TRACT | Status: DC
  Filled 2023-11-06: qty 28

## 2023-11-06 MED ORDER — PANTOPRAZOLE SODIUM 40 MG PO TBEC
40.0000 mg | DELAYED_RELEASE_TABLET | Freq: Every day | ORAL | Status: DC
  Administered 2023-11-06 – 2023-11-07 (×2): 40 mg via ORAL
  Filled 2023-11-06 (×2): qty 1

## 2023-11-06 MED ORDER — ACETAMINOPHEN 160 MG/5ML PO SOLN: 650.0000 mg | ORAL | Status: DC | PRN

## 2023-11-06 MED ORDER — SODIUM CHLORIDE 0.9% FLUSH
3.0000 mL | Freq: Once | INTRAVENOUS | Status: AC
  Administered 2023-11-06: 3 mL via INTRAVENOUS

## 2023-11-06 MED ORDER — IPRATROPIUM-ALBUTEROL 0.5-2.5 (3) MG/3ML IN SOLN
3.0000 mL | RESPIRATORY_TRACT | Status: DC | PRN
  Administered 2023-11-06: 3 mL via RESPIRATORY_TRACT
  Filled 2023-11-06: qty 3

## 2023-11-06 MED ORDER — SENNOSIDES-DOCUSATE SODIUM 8.6-50 MG PO TABS: 1.0000 | ORAL_TABLET | Freq: Every evening | ORAL | Status: DC | PRN

## 2023-11-06 MED ORDER — ATORVASTATIN CALCIUM 20 MG PO TABS
40.0000 mg | ORAL_TABLET | Freq: Every day | ORAL | Status: DC
  Administered 2023-11-06 – 2023-11-07 (×2): 40 mg via ORAL
  Filled 2023-11-06 (×2): qty 2

## 2023-11-06 MED ORDER — GADOBUTROL 1 MMOL/ML IV SOLN
10.0000 mL | Freq: Once | INTRAVENOUS | Status: AC | PRN
  Administered 2023-11-06: 10 mL via INTRAVENOUS

## 2023-11-06 MED ORDER — APIXABAN 5 MG PO TABS
5.0000 mg | ORAL_TABLET | Freq: Two times a day (BID) | ORAL | Status: DC
  Administered 2023-11-06 – 2023-11-07 (×2): 5 mg via ORAL
  Filled 2023-11-06 (×2): qty 1

## 2023-11-06 MED ORDER — SERTRALINE HCL 50 MG PO TABS
100.0000 mg | ORAL_TABLET | Freq: Every day | ORAL | Status: DC
  Administered 2023-11-06 – 2023-11-07 (×2): 100 mg via ORAL
  Filled 2023-11-06 (×2): qty 2

## 2023-11-06 MED ORDER — FUROSEMIDE 40 MG PO TABS
40.0000 mg | ORAL_TABLET | Freq: Every day | ORAL | Status: DC
  Administered 2023-11-07: 40 mg via ORAL
  Filled 2023-11-06: qty 1

## 2023-11-06 MED ORDER — UMECLIDINIUM BROMIDE 62.5 MCG/ACT IN AEPB
1.0000 | INHALATION_SPRAY | Freq: Every day | RESPIRATORY_TRACT | Status: DC
  Filled 2023-11-06: qty 7

## 2023-11-06 MED ORDER — BUDESON-GLYCOPYRROL-FORMOTEROL 160-9-4.8 MCG/ACT IN AERO: 2.0000 | INHALATION_SPRAY | Freq: Two times a day (BID) | RESPIRATORY_TRACT | Status: DC

## 2023-11-06 MED ORDER — METOPROLOL SUCCINATE ER 25 MG PO TB24
37.5000 mg | ORAL_TABLET | Freq: Two times a day (BID) | ORAL | Status: DC
  Administered 2023-11-06 – 2023-11-07 (×2): 37.5 mg via ORAL
  Filled 2023-11-06 (×2): qty 2

## 2023-11-06 MED ORDER — ASPIRIN 81 MG PO CHEW
324.0000 mg | CHEWABLE_TABLET | Freq: Once | ORAL | Status: AC
Start: 2023-11-06 — End: 2023-11-06
  Administered 2023-11-06: 324 mg via ORAL
  Filled 2023-11-06: qty 4

## 2023-11-06 MED ORDER — STROKE: EARLY STAGES OF RECOVERY BOOK
Freq: Once | Status: AC
  Administered 2023-11-07: 1

## 2023-11-06 MED ORDER — ACETAMINOPHEN 650 MG RE SUPP: 650.0000 mg | RECTAL | Status: DC | PRN

## 2023-11-06 MED ORDER — TRAMADOL HCL 50 MG PO TABS: 50.0000 mg | ORAL_TABLET | Freq: Two times a day (BID) | ORAL | Status: DC | PRN

## 2023-11-06 MED ORDER — ACETAMINOPHEN 325 MG PO TABS: 650.0000 mg | ORAL_TABLET | ORAL | Status: DC | PRN

## 2023-11-06 NOTE — H&P (Signed)
History and Physical    Patient: Autumn Price:[id number] DOB: September 30, 1941 DOA: 11/06/2023 DOS: the patient was seen and examined on 11/06/2023 PCP: Karie Schwalbe, MD  Patient coming from: Home  Chief Complaint:  Chief Complaint  Patient presents with   Aphasia   HPI: Autumn Price is a 82 y.o. female with medical history significant of frequent SVTs, hypertension, morbid obesity with BMI 47, chronic asthmatic bronchitis, depression, chronic diastolic heart failure, OSA not on CPAP chronic hypoxic respiratory failure on 2 L at bedtime, who presents to the ED due to slurred speech.  Autumn Price states that for the last 1 week, she has been experiencing rhinorrhea but denies any cough, increased work of breathing or congestion.  Then over the last day or so, she has been experiencing significant fatigue, stating that she slept a very long time and this is quite unusual for her.  Autumn Price daughter states that yesterday evening when she called her, she noticed that patient was slurring her speech and her words were very drawn out.  She notes that at this time, she does not feel that her mom's speech has returned back to normal.  Otherwise, Autumn Price denies any focal weakness, dizziness, headache.  She endorses lower extremity swelling but states it is unchanged.  She denies any nausea, vomiting, diarrhea.  She had a follow-up visit with her cardiologist today who noticed that she seemed very tired.  Due to this, she is brought to the ED.  ED course: On arrival to the ED, patient was hypertensive at 157/124 with a heart rate of 99.  She was saturating at 94% on room air.  She was afebrile at 97.7. Initial workup notable for hemoglobin of 15.1, platelets of 118, BUN of 25, creatinine 0.95 with GFR above 60.  INR 1.3.  CT of the head was obtained with no acute intracranial abnormality.  Due to concern for TIA, [facility] contacted for admission.  Review of Systems: As mentioned in the history  of present illness. All other systems reviewed and are negative.  Past Medical History:  Diagnosis Date   Depression    Hypertension    Osteoarthritis    Urge incontinence    Venous insufficiency    Past Surgical History:  Procedure Laterality Date   APPENDECTOMY  1966   DILATION AND CURETTAGE OF UTERUS  01/27/14   benign   JOINT REPLACEMENT     right knee   TOTAL KNEE ARTHROPLASTY Right 03/20/2018   Procedure: RIGHT TOTAL KNEE ARTHROPLASTY;  Surgeon: Eugenia Mcalpine, MD;  Location: [facility];  Service: Orthopedics;  Laterality: Right;   TOTAL KNEE ARTHROPLASTY Left 09/11/2018   Procedure: LEFT TOTAL KNEE ARTHROPLASTY;  Surgeon: Eugenia Mcalpine, MD;  Location: [facility];  Service: Orthopedics;  Laterality: Left;   TUBAL LIGATION  1966   Social History:  reports that she has never smoked. She has never been exposed to tobacco smoke. She has never used smokeless tobacco. She reports that she does not drink alcohol and does not use drugs.  No Known Allergies  Family History  Problem Relation Age of Onset   Heart disease Father        died age 70- MI   Alcohol abuse Brother    Heart disease Brother    Heart disease Sister    Breast cancer Neg Hx     Prior to Admission medications   Medication Sig Start Date End Date Taking? Authorizing Provider  acetaminophen (TYLENOL) 500 MG tablet  Take 500 mg by mouth every 6 (six) hours as needed for mild pain or moderate pain.    [provider]  Budeson-Glycopyrrol-Formoterol (BREZTRI AEROSPHERE) 160-9-4.8 MCG/ACT AERO Inhale 2 puffs into the lungs in the morning and at bedtime. 02/11/23   Salena Saner, MD  budesonide (PULMICORT) 0.5 MG/2ML nebulizer solution Take 2 mLs (0.5 mg total) by nebulization in the morning and at bedtime. Patient taking differently: Take 0.5 mg by nebulization in the morning and at bedtime. Taking as needed. 11/11/22   Glenford Bayley, NP  fluticasone (FLONASE) 50 MCG/ACT nasal spray PLACE 1 SPRAY INTO BOTH  NOSTRILS DAILY AS NEEDED FOR ALLERGIES OR RHINITIS. 12/03/22   Glenford Bayley, NP  furosemide (LASIX) 20 MG tablet Take 1 tablet (20 mg total) by mouth daily. 10/11/22 09/09/23  Debbe Odea, MD  ketoconazole (NIZORAL) 2 % cream APPLY 1 APPLICATION TOPICALLY TO GROIN AREA 2 TIMES A DAY AS NEEDED FOR IRRITATION 11/19/21   Karie Schwalbe, MD  levalbuterol [facility] HFA) 45 MCG/ACT inhaler Inhale 2 puffs into the lungs every 6 (six) hours as needed for wheezing. 02/13/23 02/13/24  Salena Saner, MD  metoprolol succinate (TOPROL XL) 25 MG 24 hr tablet Take 1.5 tablets (37.5 mg total) by mouth 2 (two) times daily. 09/09/23   Lanier Prude, MD  mirabegron ER (MYRBETRIQ) 50 MG TB24 tablet TAKE 1 TABLET ONCE DAILY 08/21/23   Karie Schwalbe, MD  montelukast (SINGULAIR) 10 MG tablet Take 1 tablet (10 mg total) by mouth at bedtime. 06/14/22   Glenford Bayley, NP  pantoprazole (PROTONIX) 40 MG tablet Take 1 tablet (40 mg total) by mouth daily. 09/19/23   Salena Saner, MD  sertraline (ZOLOFT) 100 MG tablet Take 1 tablet (100 mg total) by mouth daily. 06/25/23   Karie Schwalbe, MD  traMADol (ULTRAM) 50 MG tablet Take 1-2 tablets (50-100 mg total) by mouth every 12 (twelve) hours as needed. 10/16/23   Karie Schwalbe, MD    Physical Exam: Vitals:   11/06/23 1220 11/06/23 1451 11/06/23 1457 11/06/23 1734  BP: (!) 157/124  (!) 131/100 138/72  Pulse: 99  100 72  Resp: 18  (!) 22 (!) 22  Temp: 97.7 F (36.5 C)     TempSrc: Oral     SpO2: 94% 98% 97% 98%  Weight: 126.6 kg     Height: 5\' 4"  (1.626 m)      Physical Exam Vitals and nursing note reviewed.  Constitutional:      Appearance: She is morbidly obese. She is not ill-appearing or toxic-appearing.  HENT:     Head: Normocephalic and atraumatic.  Eyes:     Extraocular Movements: Extraocular movements intact.     Conjunctiva/sclera: Conjunctivae normal.     Pupils: Pupils are equal, round, and reactive to light.   Cardiovascular:     Rate and Rhythm: Normal rate and regular rhythm.     Heart sounds: No murmur heard. Pulmonary:     Effort: No tachypnea or accessory muscle usage.     Breath sounds: Wheezing (Bilateral upper lung fields) present. No decreased breath sounds, rhonchi or rales.  Abdominal:     General: Bowel sounds are normal.     Palpations: Abdomen is soft.  Musculoskeletal:     Comments: Bilateral nonpitting edema of the lower extremities  Skin:    General: Skin is warm and dry.  Neurological:     Mental Status: She is alert.     Comments:  Patient is alert and oriented x 4.  No facial asymmetry or visual field deficits.  No clear dysarthria on examination. Sensation grossly intact throughout 5 out of 5 strength throughout No tremor noted Bilateral normal finger-to-nose  Psychiatric:        Mood and Affect: Mood normal.        Behavior: Behavior normal.    Data Reviewed: CBC with WBC of 4.5, hemoglobin of 15.1, platelets of 118 CMP with sodium of 137, potassium 4.5, bicarb 22, glucose 101, BUN 25, creatinine 0.95, AST 22, ALT 10 and GFR above 60 INR 1.3 PTT 44 Alcohol level negative  EKG personally reviewed.  Narrow complex regular rate most consistent with sinus rhythm, however no clear P waves can be seen.  No pathological Q waves.  No ST or T wave changes concerning for acute ischemia.  Right axis deviation.  Echocardiogram obtained today at outpatient cardiology office; results pending  CT HEAD WO CONTRAST  Result Date: 11/06/2023 CLINICAL DATA:  Neuro deficit, acute, stroke suspected EXAM: CT HEAD WITHOUT CONTRAST TECHNIQUE: Contiguous axial images were obtained from the base of the skull through the vertex without intravenous contrast. RADIATION DOSE REDUCTION: This exam was performed according to the departmental dose-optimization program which includes automated exposure control, adjustment of the mA and/or kV according to patient size and/or use of iterative  reconstruction technique. COMPARISON:  None Available. FINDINGS: Brain: No hemorrhage. No hydrocephalus. No extra-axial fluid collection. No CT evidence of an acute cortical infarct. No mass effect. No mass lesion. Vascular: No hyperdense vessel or unexpected calcification. Skull: Normal. Negative for fracture or focal lesion. Sinuses/Orbits: No middle ear or mastoid effusion. Paranasal sinuses clear. Orbits are unremarkable. Other: None. IMPRESSION: No hemorrhage or CT evidence of an acute cortical infarct. Electronically Signed   By: Lorenza Cambridge M.D.   On: 11/06/2023 13:38    Results are pending, will review when available.  Assessment and Plan:  * Dysarthria Patient is presenting with nearly 24 hours of dysarthria as perceived by patient's daughter at bedside, although no clear dysarthria on personal neurological examination.  In addition, she has been experiencing 24 hours of profound fatigue.  Differential includes TIA/CVA versus symptomatic hypercarbia given untreated OSA versus underlying infection given new rhinorrhea.  - MRI brain pending - MRA head/neck pending - Pending MRI results, can consider neurology consultation - Telemetry monitoring - Allow for permissive HTN (systolic < 220 and diastolic < 120) pending MRI results - Echocardiogram performed in the outpatient setting today; results pending - A1C and Lipid panel - VBG - COVID-19 PCR and respiratory viral panel - ASA 325 mg once followed by 81 mg daily - Start high intensity statin - PT/OT/SLP  Paroxysmal SVT (supraventricular tachycardia) (HCC) Per chart review, patient has a long-term history of SVTs with a previous Zio monitor showing the longest duration lasting 11 minutes.  She has been maintained on metoprolol.  It seems that she recently changed cardiology services  - Continue home beta-blocker for SVT suppression  Chronic anticoagulation It appears patient was recently started on Eliquis on 11/7 by cardiology,  unclear indication given no office visit notes are available.  - Continue home Eliquis for now  Moderate persistent asthma without complication - Continue home regimen - DuoNebs as needed  Thrombocytopenia (HCC) Stable at this time with no evidence of bleeding on examination.  Chronic narcotic dependence (HCC) - Continue home tramadol, as needed only given increased fatigue  Stage 3a chronic kidney disease (HCC) Renal function currently at  baseline.  Will continue to monitor while admitted.  - BMP in the a.m.  Chronic hypoxemic respiratory failure (HCC) - Continue home 2 L of supplemental oxygen at bedtime  Chronic diastolic heart failure (HCC) Per chart review, patient has a history of HFpEF with last EF of 55-60% in August 2024.  She had an echocardiogram performed today with results pending.  She appears euvolemic at this time.  - Continue home regimen - Daily weights - Follow-up echocardiogram results  Morbid obesity (HCC) Patient has a history of morbid obesity with BMI 47.9, complicating increasing her care complexity and prognosis  Essential hypertension, benign Blood pressure maintained with metoprolol only.  Will continue at this time to prevent tachyarrhythmia.  Advance Care Planning:   Code Status: Full Code verified by patient with her daughter at bedside  Consults: None  Family Communication: Patient daughter updated at bedside  Severity of Illness: The appropriate patient status for this patient is OBSERVATION. Observation status is judged to be reasonable and necessary in order to provide the required intensity of service to ensure the patient's safety. The patient's presenting symptoms, physical exam findings, and initial radiographic and laboratory data in the context of their medical condition is felt to place them at decreased risk for further clinical deterioration. Furthermore, it is anticipated that the patient will be medically stable for discharge  from the hospital within 2 midnights of admission.   Author: Verdene Lennert, MD 11/06/2023 5:42 PM  For on call review [email or web address].

## 2023-11-06 NOTE — Assessment & Plan Note (Signed)
Stable at this time with no evidence of bleeding on examination.

## 2023-11-06 NOTE — ED Provider Notes (Signed)
[facility] Provider Note    Event Date/Time   First MD Initiated Contact with Patient 11/06/23 1442     (approximate)   History   Aphasia   HPI  Autumn Price is a 82 y.o. female presents for evaluation due to concerns of slurred speech.  She came to the ER yesterday and left before she got seen by a physician.  Symptoms started around noon.  She also seemed a bit more fatigued  No obvious weakness in the face arms or legs.  I do report her speech has seemed a little bit slurred or off.     Physical Exam   Triage Vital Signs: ED Triage Vitals [11/06/23 1220]  Encounter Vitals Group     BP (!) 157/124     Systolic BP Percentile      Diastolic BP Percentile      Pulse Rate 99     Resp 18     Temp 97.7 F (36.5 C)     Temp Source Oral     SpO2 94 %     Weight 279 lb (126.6 kg)     Height 5\' 4"  (1.626 m)     Head Circumference      Peak Flow      Pain Score 0     Pain Loc      Pain Education      Exclude from Growth Chart     Most recent vital signs: Vitals:   11/07/23 0445 11/07/23 0858  BP: (!) 118/50 (!) 143/65  Pulse: 67 69  Resp: 20 19  Temp: 97.8 F (36.6 C) 97.6 F (36.4 C)  SpO2: 95% 92%     General: Awake, no distress.  CV:  Good peripheral perfusion.  Resp:  Normal effort.  Abd:  No distention.  Other:  Alert well-oriented.  No facial droop.  Moves extremities well.  No obvious deficits.  At the present time no clear full year slurring of speech or aphasia is noted   ED Results / Procedures / Treatments   Labs (all labs ordered are listed, but only abnormal results are displayed) Labs Reviewed  PROTIME-INR - Abnormal; Notable for the following components:      Result Value   Prothrombin Time 16.3 (*)    INR 1.3 (*)    All other components within normal limits  APTT - Abnormal; Notable for the following components:   aPTT 44 (*)    All other components within normal limits  CBC - Abnormal; Notable for the  following components:   RBC 5.30 (*)    Hemoglobin 15.1 (*)    HCT 48.5 (*)    Platelets 118 (*)    All other components within normal limits  COMPREHENSIVE METABOLIC PANEL - Abnormal; Notable for the following components:   Glucose, Bld 101 (*)    BUN 25 (*)    All other components within normal limits  LIPID PANEL - Abnormal; Notable for the following components:   LDL Cholesterol 100 (*)    All other components within normal limits  BLOOD GAS, VENOUS - Abnormal; Notable for the following components:   Bicarbonate 30.1 (*)    Acid-Base Excess 3.6 (*)    All other components within normal limits  SARS CORONAVIRUS 2 BY RT PCR  RESPIRATORY PANEL BY PCR  DIFFERENTIAL  ETHANOL  HEMOGLOBIN A1C   Labs interpreted as normal white count, no anemia.  Renal function is normal  EKG  EKG  interpreted as negative for obvious acute ischemia, there is mild tachycardia possibly of a supraventricular nature.   RADIOLOGY  CT head is negative for acute finding   PROCEDURES:  Critical Care performed: No  Procedures   MEDICATIONS ORDERED IN ED: Medications  sodium chloride flush (NS) 0.9 % injection 3 mL (3 mLs Intravenous Given 11/06/23 1503)  aspirin chewable tablet 324 mg (324 mg Oral Given 11/06/23 1521)   stroke: early stages of recovery book (1 each Does not apply Given 11/07/23 1306)  gadobutrol (GADAVIST) 1 MMOL/ML injection 10 mL (10 mLs Intravenous Contrast Given 11/06/23 1730)     IMPRESSION / MDM / ASSESSMENT AND PLAN / ED COURSE  I reviewed the triage vital signs and the nursing notes.                              Differential diagnosis includes, but is not limited to, potentially broad differential, but there is reportedly mild change in speech with possible mild dysarthria since approximately noon yesterday.  Her symptoms do not suggest large vessel occlusion.  She does not have any obvious acute focal neurologic deficit at this time.  Given her age and other  clinical conditions she does have increased risk of stroke but her CT of the head is negative for hemorrhage at this time.  I think there potentially multiple causes for her symptoms, she has no obvious focal neurologic deficit to accompany this at this time.  Given the clinical symptomatology I think she would benefit from further workup in the hospital potential MRI etc. to exclude cerebrovascular accident.  Her overall workup is quite reassuring to this point though, consulted with our hospitalist for further evaluation and observation given the change in speech pattern need to exclude stroke, workup for other causes etc.  Patient's presentation is most consistent with acute complicated illness / injury requiring diagnostic workup.          FINAL CLINICAL IMPRESSION(S) / ED DIAGNOSES   Final diagnoses:  Spell of change in speech     Note:  This document was prepared using Dragon voice recognition software and may include unintentional dictation errors.   Sharyn Creamer, MD 11/19/23 [phone]

## 2023-11-06 NOTE — Assessment & Plan Note (Addendum)
-   Continue home tramadol, as needed only given increased fatigue

## 2023-11-06 NOTE — Assessment & Plan Note (Signed)
-   Continue home regimen - DuoNebs as needed

## 2023-11-06 NOTE — Assessment & Plan Note (Signed)
Blood pressure maintained with metoprolol only.  Will continue at this time to prevent tachyarrhythmia.

## 2023-11-06 NOTE — ED Notes (Signed)
Pt placed on cardiac monitor. CCMD called.

## 2023-11-06 NOTE — ED Triage Notes (Signed)
Patient to ED via POV from cardiology office for slurred speech. States [facility] yesterday at 1200. States they were here last PM and LWBS due to wait time. PT reports being more tired than normal  and family states she was falling asleep while on the phone yesterday. Daughter does not think speech is more slurred today.

## 2023-11-06 NOTE — Assessment & Plan Note (Signed)
Renal function currently at baseline.  Will continue to monitor while admitted  -BMP in the a.m.

## 2023-11-06 NOTE — Assessment & Plan Note (Signed)
Patient is presenting with nearly 24 hours of dysarthria as perceived by patient's daughter at bedside, although no clear dysarthria on personal neurological examination.  In addition, she has been experiencing 24 hours of profound fatigue.  Differential includes TIA/CVA versus symptomatic hypercarbia given untreated OSA versus underlying infection given new rhinorrhea.  - MRI brain pending - MRA head/neck pending - Pending MRI results, can consider neurology consultation - Telemetry monitoring - Allow for permissive HTN (systolic < 220 and diastolic < 120) pending MRI results - Echocardiogram performed in the outpatient setting today; results pending - A1C and Lipid panel - VBG - COVID-19 PCR and respiratory viral panel - ASA 325 mg once followed by 81 mg daily - Start high intensity statin - PT/OT/SLP

## 2023-11-06 NOTE — Assessment & Plan Note (Signed)
Per chart review, patient has a history of HFpEF with last EF of 55-60% in August 2024.  She had an echocardiogram performed today with results pending.  She appears euvolemic at this time.  - Continue home regimen - Daily weights - Follow-up echocardiogram results

## 2023-11-06 NOTE — Assessment & Plan Note (Signed)
-   Continue home 2 L of supplemental oxygen at bedtime

## 2023-11-06 NOTE — Assessment & Plan Note (Signed)
Per chart review, patient has a long-term history of SVTs with a previous Zio monitor showing the longest duration lasting 11 minutes.  She has been maintained on metoprolol.  It seems that she recently changed cardiology services  - Continue home beta-blocker for SVT suppression

## 2023-11-06 NOTE — Assessment & Plan Note (Signed)
It appears patient was recently started on Eliquis on 11/7 by cardiology, unclear indication given no office visit notes are available.  - Continue home Eliquis for now

## 2023-11-06 NOTE — ED Notes (Signed)
Pt is in MRI. Delay in transport.

## 2023-11-06 NOTE — ED Triage Notes (Signed)
Pt to ED via [facility] c/o fatigue. Per pt, pt is feeling tired and just wants to sleep. Usually wakes up early but today woke up a little later than usual. Denies CP, SOB, weakness, dizziness.

## 2023-11-06 NOTE — Assessment & Plan Note (Signed)
Patient has a history of morbid obesity with BMI 47.9, complicating increasing her care complexity and prognosis

## 2023-11-07 ENCOUNTER — Other Ambulatory Visit: Payer: Self-pay

## 2023-11-07 DIAGNOSIS — I5032 Chronic diastolic (congestive) heart failure: Secondary | ICD-10-CM | POA: Diagnosis not present

## 2023-11-07 DIAGNOSIS — R531 Weakness: Secondary | ICD-10-CM | POA: Diagnosis not present

## 2023-11-07 DIAGNOSIS — I440 Atrioventricular block, first degree: Secondary | ICD-10-CM | POA: Diagnosis not present

## 2023-11-07 DIAGNOSIS — I471 Supraventricular tachycardia, unspecified: Secondary | ICD-10-CM | POA: Diagnosis not present

## 2023-11-07 DIAGNOSIS — Z7901 Long term (current) use of anticoagulants: Secondary | ICD-10-CM | POA: Diagnosis not present

## 2023-11-07 DIAGNOSIS — R471 Dysarthria and anarthria: Secondary | ICD-10-CM | POA: Diagnosis not present

## 2023-11-07 LAB — RESPIRATORY PANEL BY PCR

## 2023-11-07 LAB — LIPID PANEL
Cholesterol: 162 mg/dL (ref 0–200)
HDL: 42 mg/dL (ref >40)
LDL Cholesterol: 100 mg/dL — ABNORMAL HIGH (ref 0–99)
Total CHOL/HDL Ratio: 3.9 {ratio}
Triglycerides: 99 mg/dL (ref <150)
VLDL: 20 mg/dL (ref 0–40)

## 2023-11-07 MED ORDER — ATORVASTATIN CALCIUM 20 MG PO TABS: 20.0000 mg | ORAL_TABLET | Freq: Every day | ORAL | 0 refills | Status: DC

## 2023-11-07 NOTE — Evaluation (Signed)
Physical Therapy Evaluation Patient Details Name: Autumn Price MRN: [id number] DOB: [birth year] Today's Date: 11/07/2023  History of Present Illness  Pt is an 82 yo female presenting with slurred speech and reported increase in fatigue. Imaging negative for acute intracranial abnormality. PMH includes frequent SVTs, hypertension, morbid obesity with BMI 47, chronic asthmatic bronchitis, depression, chronic diastolic heart failure, OSA not on CPAP, and chronic hypoxic respiratory failure on 2 L at bedtime.   Clinical Impression  Pt alert and oriented, denies pain, and seated on commode upon entry. Family in room, but pt independently performing pericare and dressing upon arrival. Pt receptive to PT and willing to answer questions regarding home setup and PLOF. Pt lives with her grandson in a mobile home with 6STE (B/L handrails), and was independent with all mobility and ADLs prior to recent hospitalization. Pt has family support as needed for cooking and driving to appointments. During eval, pt demonstrates mod I with STS from commode and EOB, ambulation, and stair negotiation (with B/L handrails), but slightly unsteady and reaching out for support t/o mobility. Pt receptive to trialing RW 2/2 becoming fatigued and requiring supervision near the end of 100' of amb. Increased steadiness and safety observed with RW and pt/family requesting one upon d/c. Pt on RA t/o session, HR and O2 WFL. Will continue to follow pt in acute care setting to address balance, endurance, and strength deficits, but anticipate pt does not require f/u PT upon discharge (RN, MD, and TOC updated).       If plan is discharge home, recommend the following: Assistance with cooking/housework;Assist for transportation;Help with stairs or ramp for entrance   Can travel by private vehicle    yes    Equipment Recommendations Rolling walker (2 wheels);Other (comment) (shower bench)  Recommendations for Other Services        Functional Status Assessment Patient has had a recent decline in their functional status and demonstrates the ability to make significant improvements in function in a reasonable and predictable amount of time.     Precautions / Restrictions Precautions Precautions: Fall Restrictions Weight Bearing Restrictions: No      Mobility  Bed Mobility Overal bed mobility: Needs Assistance Bed Mobility: Sit to Supine       Sit to supine: Min assist   General bed mobility comments: minA to lift RLE up to bed    Transfers Overall transfer level: Modified independent Equipment used: None, Rolling walker (2 wheels)               General transfer comment: mod I to stand from toilet with heavy use of grab bars; mod I to stand from bed- RW provided more stability    Ambulation/Gait Ambulation/Gait assistance: Modified independent (Device/Increase time), Supervision Gait Distance (Feet): 140 Feet (100', 40') Assistive device: Rolling walker (2 wheels), None Gait Pattern/deviations: Decreased step length - right, Decreased step length - left, Staggering right, Staggering left, Narrow base of support Gait velocity: increased     General Gait Details: Pt slightly unsteady and reaches out to furniture/counters for support during 100' amb without AD, mod I at first, progressing to supervision with fatigue; no LOB and pt reports this as her baseline. Trialed 92' amb with RW and pt reported feeling more steady; less staggering and postural sway observed with RW.  Stairs Stairs: Yes Stairs assistance: Modified independent (Device/Increase time) Stair Management: Two rails, Step to pattern Number of Stairs: 8    Wheelchair Mobility     Tilt Bed  Modified Rankin (Stroke Patients Only)       Balance Overall balance assessment: Modified Independent                                           Pertinent Vitals/Pain Pain Assessment Pain Assessment: No/denies  pain    Home Living Family/patient expects to be discharged to:: Private residence Living Arrangements: Alone (grandson) Available Help at Discharge: Family;Available PRN/intermittently (grandson) Type of Home: Mobile home Home Access: Stairs to enter Entrance Stairs-Rails: Can reach both;Right;Left Entrance Stairs-Number of Steps: 6   Home Layout: One level Home Equipment: Cane - single point      Prior Function Prior Level of Function : Independent/Modified Independent             Mobility Comments: IND with hosuehold amb- reaches out for walls in her house; uses a cart when grocery shopping ADLs Comments: IND     Extremity/Trunk Assessment   Upper Extremity Assessment Upper Extremity Assessment: Defer to OT evaluation    Lower Extremity Assessment Lower Extremity Assessment: Overall WFL for tasks assessed (3+/5 hip flexion B/L)    Cervical / Trunk Assessment Cervical / Trunk Assessment: Normal  Communication   Communication Communication: No apparent difficulties Cueing Techniques: Verbal cues  Cognition Arousal: Alert Behavior During Therapy: WFL for tasks assessed/performed Overall Cognitive Status: Within Functional Limits for tasks assessed                                          General Comments General comments (skin integrity, edema, etc.): wheezing and SOB, but pt staes this is her baseline and uses a pulse ox at home to check her O2; HR and O2 WFL t/o; pt receptive to using O2 more regularly at home at night    Exercises     Assessment/Plan    PT Assessment Patient needs continued PT services  PT Problem List Decreased strength;Decreased mobility;Decreased activity tolerance;Decreased balance;Decreased knowledge of use of DME;Cardiopulmonary status limiting activity       PT Treatment Interventions DME instruction;Therapeutic exercise;Balance training;Gait training;Stair training;Functional mobility training;Therapeutic  activities;Patient/family education    PT Goals (Current goals can be found in the Care Plan section)  Acute Rehab PT Goals Patient Stated Goal: to get back to normal PT Goal Formulation: With patient Time For Goal Achievement: 11/21/23 Potential to Achieve Goals: Good    Frequency Min 1X/week     Co-evaluation               AM-PAC PT "6 Clicks" Mobility  Outcome Measure Help needed turning from your back to your side while in a flat bed without using bedrails?: A Little Help needed moving from lying on your back to sitting on the side of a flat bed without using bedrails?: A Little Help needed moving to and from a bed to a chair (including a wheelchair)?: None Help needed standing up from a chair using your arms (e.g., wheelchair or bedside chair)?: None Help needed to walk in hospital room?: None Help needed climbing 3-5 steps with a railing? : None 6 Click Score: 22    End of Session Equipment Utilized During Treatment: Gait belt Activity Tolerance: Patient tolerated treatment well;Patient limited by fatigue Patient left: in bed;with bed alarm set;with family/visitor present;with call bell/phone within reach  Nurse Communication: Mobility status PT Visit Diagnosis: Unsteadiness on feet (R26.81);Muscle weakness (generalized) (M62.81)    Time: 1610-9604 PT Time Calculation (min) (ACUTE ONLY): 28 min   Charges:   PT Evaluation $PT Eval Low Complexity: 1 Low PT Treatments $Gait Training: 8-22 mins PT General Charges $$ ACUTE PT VISIT: 1 Visit         Shauna Hugh, SPT 11/07/2023, 11:35 AM

## 2023-11-07 NOTE — Progress Notes (Signed)
       CROSS COVER NOTE  NAME: Autumn Price MRN: [id number] DOB : 10-07-1941    Concern as stated by nurse / staff   Message received from [facility]  [address] Much, admitting dx dysarthria, has a hx of SVT, tele noted 1st degree AV block.   Pertinent findings on chart review:   Assessment and  Interventions   Assessment:    11/07/2023   12:27 AM 11/06/2023    9:24 PM 11/06/2023    8:34 PM  Vitals with BMI  Systolic 121 138 191  Diastolic 60 62 62  Pulse 111 105 105    EKG ST with 1s t degree AV block with minimal st elevation in inferior leads. Compared to baseline EKG of 03/24/2023 and they are th same Plan: Continue to monitor on tele

## 2023-11-07 NOTE — TOC Transition Note (Signed)
Transition of Care [facility]) - CM/SW Discharge Note   Patient Details  Name: Autumn Price MRN: [id number] Date of Birth: July 26, 1941  [facility]) CM/SW Contact:  Hetty Ely, RN Phone Number: 11/07/2023, 12:58 PM   Final next level of care: Home/Self Care Barriers to Discharge: Barriers Resolved   Patient Goals and CMS Choice      Discharge Placement                      Patient and family notified of of transfer: 11/07/23  Discharge Plan and Services Additional resources added to the After Visit Summary for                  DME Arranged: Walker rolling, Other see comment Leisure centre manager) DME Agency: [employer] Date DME Agency Contacted: 11/07/23   Representative spoke with at DME Agency: Yvone Neu HH Arranged: NA HH Agency: NA        Social Determinants of Health (SDOH) Interventions SDOH Screenings   Food Insecurity: No Food Insecurity (11/07/2023)  Housing: Low Risk  (11/07/2023)  Transportation Needs: No Transportation Needs (11/07/2023)  Utilities: Not At Risk (11/07/2023)  Alcohol Screen: Low Risk  (08/18/2021)  Depression (PHQ2-9): Medium Risk (07/22/2023)  Financial Resource Strain: Low Risk  (08/18/2021)  Physical Activity: Insufficiently Active (08/18/2021)  Social Connections: Moderately Isolated (08/18/2021)  Stress: No Stress Concern Present (08/18/2021)  Tobacco Use: Low Risk  (11/06/2023)     Readmission Risk Interventions     No data to display

## 2023-11-07 NOTE — Evaluation (Signed)
Occupational Therapy Evaluation Patient Details Name: Autumn Price MRN: [id number] DOB: 1941-07-19 Today's Date: 11/07/2023   History of Present Illness 82 y.o. female who presents to the ED due to slurred speech and increased fatigue. PMHx: frequent SVTs, hypertension, morbid obesity with BMI 47, chronic asthmatic bronchitis, depression, chronic diastolic heart failure, OSA not on CPAP chronic hypoxic respiratory failure on 2 L at bedtime. MRI negative.   Clinical Impression   Pt was seen for OT evaluation this date. Prior to hospital admission, pt lives at home alone and reports IND PLOF. She has good family support. Does not use any AD.   Pt presents to acute OT demonstrating no functional decline related to her ADL performance and functional mobility. MOD I throughout session for bed mobility, STS, mobility to the bathroom, toilet transfer and standing at sink for oral care performance. OT spent extensive time educating on compensatory strategies and ECS to use including reacher which pt has at home and knows how to use for LB ADLs, as well as a long handled sponge and shower bench to place in her bathtub. Pt noted with some wheezing/SOB, but reports she tends to hold her breath. Replaced tele battery as it was noted to be dead, and HR WFL. Grandson in room reports he will be able to set up shower bench in pt's home upon DC. Do not anticipate the need for follow up OT services upon acute hospital DC. OT to sign off in house as pt does not appear to have further needs.        If plan is discharge home, recommend the following: Assist for transportation    Functional Status Assessment  Patient has not had a recent decline in their functional status  Equipment Recommendations  Tub/shower bench    Recommendations for Other Services       Precautions / Restrictions Precautions Precautions: None Restrictions Weight Bearing Restrictions: No      Mobility Bed Mobility Overal bed  mobility: Modified Independent             General bed mobility comments: MOD I    Transfers Overall transfer level: Modified independent                 General transfer comment: MOD I      Balance Overall balance assessment: Modified Independent                                         ADL either performed or assessed with clinical judgement   ADL Overall ADL's : Modified independent                                       General ADL Comments: toilet transfer MOD I and oral care standing at sink MOD I     Vision         Perception         Praxis         Pertinent Vitals/Pain Pain Assessment Pain Assessment: No/denies pain     Extremity/Trunk Assessment Upper Extremity Assessment Upper Extremity Assessment: Overall WFL for tasks assessed   Lower Extremity Assessment Lower Extremity Assessment: Overall WFL for tasks assessed       Communication Communication Communication: No apparent difficulties Cueing Techniques: Verbal cues   Cognition Arousal: Alert  Behavior During Therapy: WFL for tasks assessed/performed Overall Cognitive Status: Within Functional Limits for tasks assessed                                       General Comments  some wheezing, SOB, pt  states she holds her breath at times; tele battery dead, replaced at end of session with HR [facility]    Exercises Other Exercises Other Exercises: Edu on role of OT in acute setting Other Exercises: Educated on various shower devices, e.g. shower chair, shower stool, shower bench for best fit and needs of pt. Shower bench recommended with pt preferring this. Other Exercises: Edu on compensatory and ECS to use during ADL performance including use of reacher, long handled sponge, etc.   Shoulder Instructions      Home Living Family/patient expects to be discharged to:: Private residence Living Arrangements: Alone Available Help at  Discharge: Family;Available PRN/intermittently (grandson) Type of Home: Mobile home Home Access: Stairs to enter   Entrance Stairs-Rails: Can reach both;Right;Left Home Layout: One level     Bathroom Shower/Tub: Chief Strategy Officer: Handicapped height     Home Equipment: Agricultural consultant (2 wheels)          Prior Functioning/Environment Prior Level of Function : Independent/Modified Independent             Mobility Comments: IND ADLs Comments: IND        OT Problem List: Decreased activity tolerance      OT Treatment/Interventions:      OT Goals(Current goals can be found in the care plan section)    OT Frequency:      Co-evaluation              AM-PAC OT "6 Clicks" Daily Activity     Outcome Measure Help from another person eating meals?: None Help from another person taking care of personal grooming?: None Help from another person toileting, which includes using toliet, bedpan, or urinal?: None Help from another person bathing (including washing, rinsing, drying)?: None Help from another person to put on and taking off regular upper body clothing?: None Help from another person to put on and taking off regular lower body clothing?: None 6 Click Score: 24   End of Session Nurse Communication: Mobility status  Activity Tolerance: Patient tolerated treatment well Patient left: in bed;with call bell/phone within reach;with bed alarm set  OT Visit Diagnosis: Other abnormalities of gait and mobility (R26.89)                Time: 1000-1023 OT Time Calculation (min): 23 min Charges:  OT General Charges $OT Visit: 1 Visit OT Evaluation $OT Eval Low Complexity: 1 Low OT Treatments $Self Care/Home Management : 8-22 mins Caleb Prigmore, OTR/L 11/07/23, 10:37 AM  Constance Goltz 11/07/2023, 10:37 AM

## 2023-11-07 NOTE — Progress Notes (Signed)
CM spoke with patient at bedside, patient lives alone, IADL's, able to drive herself to medical appointments, shopping, pharmacy. Patient says family lives close by and assist as needed, she often has them to accompany her with shopping and medical visits. Patient voice no needs at this time. No needs identified.

## 2023-11-08 LAB — BLOOD GAS, VENOUS
Acid-Base Excess: 3.6 mmol/L — ABNORMAL HIGH (ref 0.0–2.0)
Bicarbonate: 30.1 mmol/L — ABNORMAL HIGH (ref 20.0–28.0)
Patient temperature: 37
pCO2, Ven: 52 mm[Hg] (ref 44–60)
pH, Ven: 7.37 (ref 7.25–7.43)

## 2023-11-08 NOTE — Discharge Summary (Addendum)
Physician Discharge Summary   Patient: Autumn Price MRN: [id number] DOB: [birth year]  Admit date:     11/06/2023  Discharge date: 11/07/23  Discharge Physician: Delfino Lovett   PCP: Karie Schwalbe, MD   Recommendations at discharge:   Follow-up with outpatient providers as requested  Discharge Diagnoses: Principal Problem:   Dysarthria Active Problems:   Paroxysmal SVT (supraventricular tachycardia) (HCC)   Chronic anticoagulation   Essential hypertension, benign   Morbid obesity (HCC)   Chronic diastolic heart failure (HCC)   Chronic hypoxemic respiratory failure (HCC)   Stage 3a chronic kidney disease (HCC)   Chronic narcotic dependence (HCC)   Thrombocytopenia (HCC)   Moderate persistent asthma without complication  Hospital Course: Assessment and Plan:  82 year old female with a known history of hypertension, morbid obesity, chronic asthmatic bronchitis, depression, diastolic heart failure, OSA, frequent SVTs, chronic hypoxic respiratory failure on 2 L at baseline was admitted for dysarthria  * Dysarthria TIA/CVA ruled out.  Speech issues resolved and she is at baseline. Likely symptomatic hypercarbia given untreated OSA versus underlying infection given new rhinorrhea.   - MRI brain negative - MRA head/neck showing no acute intracranial abnormality.  No large vessel occlusion.  Moderate to severe narrowing of the origin of the right vertebral artery.  Mild narrowing of the origin of the left ICA. - Outpatient neuro evaluation if needed   Paroxysmal SVT (supraventricular tachycardia) (HCC) Per chart review, patient has a long-term history of SVTs with a previous Zio monitor showing the longest duration lasting 11 minutes.  She has been maintained on metoprolol.  It seems that she recently changed cardiology services   - Continue home beta-blocker for SVT suppression   Chronic anticoagulation It appears patient was recently started on Eliquis on 11/7 by cardiology,  unclear indication given no office visit notes are available.   - Continue home Eliquis for now   Moderate persistent asthma without complication - Continue home regimen   Thrombocytopenia (HCC) Stable at this time with no evidence of bleeding on examination.   Chronic narcotic dependence (HCC) Stage 3a chronic kidney disease (HCC) Chronic hypoxemic respiratory failure (HCC) - Continue home 2 L of supplemental oxygen at bedtime   Chronic diastolic heart failure (HCC) Per chart review, patient has a history of HFpEF with last EF of 55-60% in August 2024.  She had an echocardiogram performed today with results pending.  She appears euvolemic at this time.   Morbid obesity (HCC) Patient has a history of morbid obesity with BMI 47.9, complicating increasing her care complexity and prognosis   Essential hypertension, benign Blood pressure maintained with metoprolol only.   Her discharge planning was confirmed with patient and her daughter who was in agreement        Disposition: Home Diet recommendation:  Discharge Diet Orders (From admission, onward)     Start     Ordered   11/07/23 0000  Diet - low sodium heart healthy        11/07/23 1150           Carb modified diet DISCHARGE MEDICATION: Allergies as of 11/07/2023   No Known Allergies      Medication List     TAKE these medications    acetaminophen 500 MG tablet Commonly known as: TYLENOL Take 500 mg by mouth every 6 (six) hours as needed for mild pain or moderate pain.   atorvastatin 20 MG tablet Commonly known as: LIPITOR Take 1 tablet (20 mg total) by mouth daily.  Breztri Aerosphere 160-9-4.8 MCG/ACT Aero Generic drug: Budeson-Glycopyrrol-Formoterol Inhale 2 puffs into the lungs in the morning and at bedtime.   budesonide 0.5 MG/2ML nebulizer solution Commonly known as: Pulmicort Take 2 mLs (0.5 mg total) by nebulization in the morning and at bedtime. What changed: additional instructions    Eliquis 5 MG Tabs tablet Generic drug: apixaban Take 5 mg by mouth 2 (two) times daily.   fluticasone 50 MCG/ACT nasal spray Commonly known as: FLONASE PLACE 1 SPRAY INTO BOTH NOSTRILS DAILY AS NEEDED FOR ALLERGIES OR RHINITIS.   furosemide 40 MG tablet Commonly known as: LASIX Take 40 mg by mouth daily.   ketoconazole 2 % cream Commonly known as: NIZORAL APPLY 1 APPLICATION TOPICALLY TO GROIN AREA 2 TIMES A DAY AS NEEDED FOR IRRITATION   levalbuterol 45 MCG/ACT inhaler Commonly known as: XOPENEX HFA Inhale 2 puffs into the lungs every 6 (six) hours as needed for wheezing.   metoprolol succinate 25 MG 24 hr tablet Commonly known as: Toprol XL Take 1.5 tablets (37.5 mg total) by mouth 2 (two) times daily.   montelukast 10 MG tablet Commonly known as: SINGULAIR Take 1 tablet (10 mg total) by mouth at bedtime.   Myrbetriq 50 MG Tb24 tablet Generic drug: mirabegron ER TAKE 1 TABLET ONCE DAILY   pantoprazole 40 MG tablet Commonly known as: PROTONIX Take 1 tablet (40 mg total) by mouth daily.   sertraline 100 MG tablet Commonly known as: ZOLOFT Take 1 tablet (100 mg total) by mouth daily.   traMADol 50 MG tablet Commonly known as: ULTRAM Take 1-2 tablets (50-100 mg total) by mouth every 12 (twelve) hours as needed.        Follow-up Information     Karie Schwalbe, MD. Schedule an appointment as soon as possible for a visit in 1 week(s).   Specialties: Internal Medicine, Pediatrics Why: [facility] Discharge F/UP Contact information: [address] [place] [address] [phone]         Debbe Odea, MD. Schedule an appointment as soon as possible for a visit in 2 week(s).   Specialties: Cardiology, Radiology Why: [facility] Discharge F/UP Contact information: [address] [place] [address] [phone]         Salena Saner, MD. Schedule an appointment as soon as possible for a visit in 3 week(s).    Specialty: Pulmonary Disease Why: [facility] Discharge F/UP Contact information: [address] [place] [address] [phone]         Marcie Bal, NP. Schedule an appointment as soon as possible for a visit in 4 week(s).   Specialty: Vascular Surgery Why: [facility] Discharge F/UP Contact information: [address] [place] [address] [phone]         Lonell Face, MD. Schedule an appointment as soon as possible for a visit in 4 week(s).   Specialty: Neurology Why: [facility] Discharge F/UP Contact information: [address] [facility] [place] [address] [phone]                Discharge Exam: Ceasar Mons Weights   11/06/23 1220  Weight: 12.24 kg   81 year old morbidly obese female lying in the bed comfortably without any acute distress Lungs clear to auscultation bilaterally Heart regular rate and rhythm Abdomen soft, benign Neuro alert and awake, nonfocal Skin no rash or lesion  Condition at discharge: fair  The results of significant diagnostics from this hospitalization (  including imaging, microbiology, ancillary and laboratory) are listed below for reference.   Imaging Studies: MR BRAIN WO CONTRAST  Result Date: 11/06/2023 CLINICAL DATA:  Neuro deficit, acute, stroke suspected; acute neuro deficit EXAM: MRI HEAD WITHOUT CONTRAST MRA HEAD WITHOUT CONTRAST MRA NECK WITHOUT AND WITH CONTRAST TECHNIQUE: Multiplanar, multiecho pulse sequences of the brain and surrounding structures were obtained without intravenous contrast. Angiographic images of the Circle of Willis were obtained using MRA technique without intravenous contrast. Angiographic images of the neck were acquired using MRA technique without and with intravenous contrast. Carotid stenosis measurements (when applicable) are obtained utilizing NASCET criteria, using the distal internal carotid diameter as the  denominator. CONTRAST:  10mL GADAVIST GADOBUTROL 1 MMOL/ML IV SOLN COMPARISON:  Same day CT head. FINDINGS: MRI HEAD FINDINGS Brain: Negative for an acute infarct. No hemorrhage. No hydrocephalus. No extra-axial fluid collection. No mass effect. No mass lesion. There is a background of moderate chronic microvascular ischemic change. Vascular: See below Skull and upper cervical spine: Normal marrow signal. Sinuses/Orbits: No middle ear or mastoid effusion. Paranasal sinuses are clear. Orbits are unremarkable. Other: None. MRA HEAD FINDINGS Anterior circulation: No proximal occlusion. No aneurysm. No vascular malformation. Posterior circulation: No proximal occlusion. No aneurysm. No vascular malformation. MRA NECK FINDINGS Aortic arch: Three-vessel arch. No dissection. No significant plaque. Right carotid system: No dissection. No occlusion. No hemodynamically significant stenosis. Left carotid system: No dissection. No occlusion. No hemodynamically significant stenosis. There is likely mild narrowing of the origin of the left ICA. Vertebral arteries: There is likely moderate to severe narrowing of the origin of the right vertebral artery. Left dominant system. Other: None. IMPRESSION: 1. No acute intracranial abnormality. 2. No intracranial large vessel occlusion. 3. Likely moderate to severe narrowing of the origin of the right vertebral artery. 4. Likely mild narrowing of the origin of the left ICA. Electronically Signed   By: Lorenza Cambridge M.D.   On: 11/06/2023 17:41   MR ANGIO HEAD WO CONTRAST  Result Date: 11/06/2023 CLINICAL DATA:  Neuro deficit, acute, stroke suspected; acute neuro deficit EXAM: MRI HEAD WITHOUT CONTRAST MRA HEAD WITHOUT CONTRAST MRA NECK WITHOUT AND WITH CONTRAST TECHNIQUE: Multiplanar, multiecho pulse sequences of the brain and surrounding structures were obtained without intravenous contrast. Angiographic images of the Circle of Willis were obtained using MRA technique without  intravenous contrast. Angiographic images of the neck were acquired using MRA technique without and with intravenous contrast. Carotid stenosis measurements (when applicable) are obtained utilizing NASCET criteria, using the distal internal carotid diameter as the denominator. CONTRAST:  10mL GADAVIST GADOBUTROL 1 MMOL/ML IV SOLN COMPARISON:  Same day CT head. FINDINGS: MRI HEAD FINDINGS Brain: Negative for an acute infarct. No hemorrhage. No hydrocephalus. No extra-axial fluid collection. No mass effect. No mass lesion. There is a background of moderate chronic microvascular ischemic change. Vascular: See below Skull and upper cervical spine: Normal marrow signal. Sinuses/Orbits: No middle ear or mastoid effusion. Paranasal sinuses are clear. Orbits are unremarkable. Other: None. MRA HEAD FINDINGS Anterior circulation: No proximal occlusion. No aneurysm. No vascular malformation. Posterior circulation: No proximal occlusion. No aneurysm. No vascular malformation. MRA NECK FINDINGS Aortic arch: Three-vessel arch. No dissection. No significant plaque. Right carotid system: No dissection. No occlusion. No hemodynamically significant stenosis. Left carotid system: No dissection. No occlusion. No hemodynamically significant stenosis. There is likely mild narrowing of the origin of the left ICA. Vertebral arteries: There is likely moderate to severe narrowing of the origin of the right vertebral artery. Left dominant  system. Other: None. IMPRESSION: 1. No acute intracranial abnormality. 2. No intracranial large vessel occlusion. 3. Likely moderate to severe narrowing of the origin of the right vertebral artery. 4. Likely mild narrowing of the origin of the left ICA. Electronically Signed   By: Lorenza Cambridge M.D.   On: 11/06/2023 17:41   MR ANGIO NECK W WO CONTRAST  Result Date: 11/06/2023 CLINICAL DATA:  Neuro deficit, acute, stroke suspected; acute neuro deficit EXAM: MRI HEAD WITHOUT CONTRAST MRA HEAD WITHOUT  CONTRAST MRA NECK WITHOUT AND WITH CONTRAST TECHNIQUE: Multiplanar, multiecho pulse sequences of the brain and surrounding structures were obtained without intravenous contrast. Angiographic images of the Circle of Willis were obtained using MRA technique without intravenous contrast. Angiographic images of the neck were acquired using MRA technique without and with intravenous contrast. Carotid stenosis measurements (when applicable) are obtained utilizing NASCET criteria, using the distal internal carotid diameter as the denominator. CONTRAST:  10mL GADAVIST GADOBUTROL 1 MMOL/ML IV SOLN COMPARISON:  Same day CT head. FINDINGS: MRI HEAD FINDINGS Brain: Negative for an acute infarct. No hemorrhage. No hydrocephalus. No extra-axial fluid collection. No mass effect. No mass lesion. There is a background of moderate chronic microvascular ischemic change. Vascular: See below Skull and upper cervical spine: Normal marrow signal. Sinuses/Orbits: No middle ear or mastoid effusion. Paranasal sinuses are clear. Orbits are unremarkable. Other: None. MRA HEAD FINDINGS Anterior circulation: No proximal occlusion. No aneurysm. No vascular malformation. Posterior circulation: No proximal occlusion. No aneurysm. No vascular malformation. MRA NECK FINDINGS Aortic arch: Three-vessel arch. No dissection. No significant plaque. Right carotid system: No dissection. No occlusion. No hemodynamically significant stenosis. Left carotid system: No dissection. No occlusion. No hemodynamically significant stenosis. There is likely mild narrowing of the origin of the left ICA. Vertebral arteries: There is likely moderate to severe narrowing of the origin of the right vertebral artery. Left dominant system. Other: None. IMPRESSION: 1. No acute intracranial abnormality. 2. No intracranial large vessel occlusion. 3. Likely moderate to severe narrowing of the origin of the right vertebral artery. 4. Likely mild narrowing of the origin of the left  ICA. Electronically Signed   By: Lorenza Cambridge M.D.   On: 11/06/2023 17:41   CT HEAD WO CONTRAST  Result Date: 11/06/2023 CLINICAL DATA:  Neuro deficit, acute, stroke suspected EXAM: CT HEAD WITHOUT CONTRAST TECHNIQUE: Contiguous axial images were obtained from the base of the skull through the vertex without intravenous contrast. RADIATION DOSE REDUCTION: This exam was performed according to the departmental dose-optimization program which includes automated exposure control, adjustment of the mA and/or kV according to patient size and/or use of iterative reconstruction technique. COMPARISON:  None Available. FINDINGS: Brain: No hemorrhage. No hydrocephalus. No extra-axial fluid collection. No CT evidence of an acute cortical infarct. No mass effect. No mass lesion. Vascular: No hyperdense vessel or unexpected calcification. Skull: Normal. Negative for fracture or focal lesion. Sinuses/Orbits: No middle ear or mastoid effusion. Paranasal sinuses clear. Orbits are unremarkable. Other: None. IMPRESSION: No hemorrhage or CT evidence of an acute cortical infarct. Electronically Signed   By: Lorenza Cambridge M.D.   On: 11/06/2023 13:38    Microbiology: Results for orders placed or performed during the hospital encounter of 11/06/23  SARS Coronavirus 2 by RT PCR (hospital order, performed in [facility] hospital lab) *cepheid single result test* Anterior Nasal Swab     Status: None   Collection Time: 11/06/23  8:28 PM   Specimen: Anterior Nasal Swab  Result Value Ref Range Status  SARS Coronavirus 2 by RT PCR NEGATIVE NEGATIVE Final    Comment: (NOTE) SARS-CoV-2 target nucleic acids are NOT DETECTED.  The SARS-CoV-2 RNA is generally detectable in upper and lower respiratory specimens during the acute phase of infection. The lowest concentration of SARS-CoV-2 viral copies this assay can detect is 250 copies / mL. A negative result does not preclude SARS-CoV-2 infection and should not be used as the sole  basis for treatment or other patient management decisions.  A negative result may occur with improper specimen collection / handling, submission of specimen other than nasopharyngeal swab, presence of viral mutation(s) within the areas targeted by this assay, and inadequate number of viral copies (<250 copies / mL). A negative result must be combined with clinical observations, patient history, and epidemiological information.  Fact Sheet for Patients:   RoadLapTop.co.za  Fact Sheet for Healthcare Providers: http://kim-miller.com/  This test is not yet approved or  cleared by the [place] FDA and has been authorized for detection and/or diagnosis of SARS-CoV-2 by FDA under an Emergency Use Authorization (EUA).  This EUA will remain in effect (meaning this test can be used) for the duration of the COVID-19 declaration under Section 564(b)(1) of the Act, 21 U.S.C. section 360bbb-3(b)(1), unless the authorization is terminated or revoked sooner.  Performed at [facility], [address] [place], [place] [address]   Respiratory (~20 pathogens) panel by PCR     Status: None   Collection Time: 11/06/23  8:28 PM   Specimen: Nasopharyngeal Swab; Respiratory  Result Value Ref Range Status   Adenovirus NOT DETECTED NOT DETECTED Final   Coronavirus 229E NOT DETECTED NOT DETECTED Final    Comment: (NOTE) The Coronavirus on the Respiratory Panel, DOES NOT test for the novel  Coronavirus (2019 nCoV)    Coronavirus HKU1 NOT DETECTED NOT DETECTED Final   Coronavirus NL63 NOT DETECTED NOT DETECTED Final   Coronavirus OC43 NOT DETECTED NOT DETECTED Final   Metapneumovirus NOT DETECTED NOT DETECTED Final   Rhinovirus / Enterovirus NOT DETECTED NOT DETECTED Final   Influenza A NOT DETECTED NOT DETECTED Final   Influenza B NOT DETECTED NOT DETECTED Final   Parainfluenza Virus 1 NOT DETECTED NOT DETECTED Final   Parainfluenza Virus 2  NOT DETECTED NOT DETECTED Final   Parainfluenza Virus 3 NOT DETECTED NOT DETECTED Final   Parainfluenza Virus 4 NOT DETECTED NOT DETECTED Final   Respiratory Syncytial Virus NOT DETECTED NOT DETECTED Final   Bordetella pertussis NOT DETECTED NOT DETECTED Final   Bordetella Parapertussis NOT DETECTED NOT DETECTED Final   Chlamydophila pneumoniae NOT DETECTED NOT DETECTED Final   Mycoplasma pneumoniae NOT DETECTED NOT DETECTED Final    Comment: Performed at [facility], [address] [place], [place] [address]    Labs: CBC: Recent Labs  Lab 11/06/23 0013 11/06/23 1222  WBC 5.5 4.5  NEUTROABS 3.1 2.6  HGB 15.0 15.1*  HCT 47.1* 48.5*  MCV 90.6 91.5  PLT 152 118*   Basic Metabolic Panel: Recent Labs  Lab 11/06/23 0013 11/06/23 1222  NA 136 137  K 4.2 4.5  CL 101 104  CO2 27 22  GLUCOSE 135* 101*  BUN 28* 25*  CREATININE 1.09* 0.95  CALCIUM 9.4 9.6   Liver Function Tests: Recent Labs  Lab 11/06/23 0013 11/06/23 1222  AST 17 22  ALT 12 10  ALKPHOS 101 92  BILITOT 0.3 1.1  PROT 7.5 7.8  ALBUMIN 4.1 4.3   CBG: No results for input(s): "  GLUCAP" in the last 168 hours.  Discharge time spent: greater than 30 minutes.  Signed: Delfino Lovett, MD [facility] 11/08/2023

## 2023-11-10 ENCOUNTER — Telehealth: Payer: Self-pay

## 2023-11-10 ENCOUNTER — Ambulatory Visit: Payer: 59

## 2023-11-10 ENCOUNTER — Encounter: Payer: Self-pay | Admitting: Internal Medicine

## 2023-11-10 ENCOUNTER — Ambulatory Visit (INDEPENDENT_AMBULATORY_CARE_PROVIDER_SITE_OTHER): Payer: 59 | Admitting: Internal Medicine

## 2023-11-10 VITALS — BP 110/70 | HR 109 | Temp 97.7°F | Ht 64.0 in | Wt 276.0 lb

## 2023-11-10 DIAGNOSIS — Z8673 Personal history of transient ischemic attack (TIA), and cerebral infarction without residual deficits: Secondary | ICD-10-CM | POA: Diagnosis not present

## 2023-11-10 DIAGNOSIS — I480 Paroxysmal atrial fibrillation: Secondary | ICD-10-CM | POA: Insufficient documentation

## 2023-11-10 DIAGNOSIS — R400 Somnolence: Secondary | ICD-10-CM | POA: Diagnosis not present

## 2023-11-10 NOTE — Assessment & Plan Note (Signed)
Could be related to sleep apnea---but she will not even wear a nasal cannula Asked her to retry the oxygen at least Also asked her to limit tramadol---try not taking it during the day

## 2023-11-10 NOTE — Transitions of Care (Post Inpatient/ED Visit) (Unsigned)
   11/10/2023  Name: Autumn Price MRN: [id number] DOB: 1941/02/16  Today's TOC FU Call Status: Today's TOC FU Call Status:: Unsuccessful Call (1st Attempt) Unsuccessful Call (1st Attempt) Date: 11/10/23  Attempted to reach the patient regarding the most recent Inpatient/ED visit.  Follow Up Plan: Additional outreach attempts will be made to reach the patient to complete the Transitions of Care (Post Inpatient/ED visit) call.    Joachim Carton, CMA  [facility] AWV Team Direct Dial: [phone]

## 2023-11-10 NOTE — Assessment & Plan Note (Signed)
Had speech issues which have cleared MRI negative and MRA did not show worrisome blockages

## 2023-11-10 NOTE — Assessment & Plan Note (Addendum)
Diagnosed recently with new cardiologist---and the records are inexplicably not available (despite told she was at [facility] clinic) Some irregular now--will check EKG  EKG shows sinus tach at 107. Normal axis and intervals. No ischemia or hypertrophy.Looks similar to 3 days ago at hospital (though called junctional then due to poorly seen p waves ---which is again the case) Now on eliquis Will need to follow up with the new cardiologist

## 2023-11-10 NOTE — Progress Notes (Addendum)
Subjective:    Patient ID: Autumn Price, female    DOB: [birth year], 82 y.o.   MRN: [id number]  HPI Here with grandson, Ephriam Knuckles for hospital follow up  Micah Flesher to ER 11/20---was just nodding off to sleep (she doesn't really remember) Grandson noted slurred speech ~9PM Daughter and son called rescue---to ER Wasn't seen after some hours--so went home  Next morning, went for echo at cardiologist --Dr Melton Alar at [facility] Fluid pill had been doubled 2 weeks before this Sleeping more and the speech issues She sent her back to ER for possible TIA I don't see any of these notes though Was told she had atrial fibrillation ---2-3 weeks ago Started on eliquis 2 weeks ago--probably when the diagnosis made  Admitted overnight 11/21-11/22 MRI showed no sig abnormality. MRA showed moderate narrowing of right vertebral artery and mild narrowing left ICA  Still having energy level problems Marked daytime somnolence Speech seems back to normal per grandson  Known sleep apnea---but hasn't considered CPAP (doesn't think she could tolerated since she doesn't tolerate even nasal oxygen)  Does feel heart fast at times No chest pain Gets DOE--when she does anything (and will wheeze) Doubling furosemide didn't make any difference with this  Current Outpatient Medications on File Prior to Visit  Medication Sig Dispense Refill   acetaminophen (TYLENOL) 500 MG tablet Take 500 mg by mouth every 6 (six) hours as needed for mild pain or moderate pain.     Budeson-Glycopyrrol-Formoterol (BREZTRI AEROSPHERE) 160-9-4.8 MCG/ACT AERO Inhale 2 puffs into the lungs in the morning and at bedtime. 10.7 g 3   budesonide (PULMICORT) 0.5 MG/2ML nebulizer solution Take 2 mLs (0.5 mg total) by nebulization in the morning and at bedtime. (Patient taking differently: Take 0.5 mg by nebulization in the morning and at bedtime. Taking as needed.) 120 mL 5   ELIQUIS 5 MG TABS tablet Take 5 mg by mouth 2 (two) times  daily.     fluticasone (FLONASE) 50 MCG/ACT nasal spray PLACE 1 SPRAY INTO BOTH NOSTRILS DAILY AS NEEDED FOR ALLERGIES OR RHINITIS. 48 mL 1   furosemide (LASIX) 40 MG tablet Take 40 mg by mouth daily.     ketoconazole (NIZORAL) 2 % cream APPLY 1 APPLICATION TOPICALLY TO GROIN AREA 2 TIMES A DAY AS NEEDED FOR IRRITATION 30 g 3   levalbuterol (XOPENEX HFA) 45 MCG/ACT inhaler Inhale 2 puffs into the lungs every 6 (six) hours as needed for wheezing. 1 each 2   metoprolol succinate (TOPROL XL) 25 MG 24 hr tablet Take 1.5 tablets (37.5 mg total) by mouth 2 (two) times daily. 270 tablet 3   mirabegron ER (MYRBETRIQ) 50 MG TB24 tablet TAKE 1 TABLET ONCE DAILY 90 tablet 3   montelukast (SINGULAIR) 10 MG tablet Take 1 tablet (10 mg total) by mouth at bedtime. 30 tablet 11   pantoprazole (PROTONIX) 40 MG tablet Take 1 tablet (40 mg total) by mouth daily. 30 tablet 0   sertraline (ZOLOFT) 100 MG tablet Take 1 tablet (100 mg total) by mouth daily. 90 tablet 3   traMADol (ULTRAM) 50 MG tablet Take 1-2 tablets (50-100 mg total) by mouth every 12 (twelve) hours as needed. 120 tablet 0   atorvastatin (LIPITOR) 20 MG tablet Take 1 tablet (20 mg total) by mouth daily. (Patient not taking: Reported on 11/10/2023) 30 tablet 0   No current facility-administered medications on file prior to visit.    No Known Allergies  Past Medical History:  Diagnosis Date  Depression    Hypertension    Osteoarthritis    Urge incontinence    Venous insufficiency     Past Surgical History:  Procedure Laterality Date   APPENDECTOMY  1966   DILATION AND CURETTAGE OF UTERUS  01/27/14   benign   JOINT REPLACEMENT     right knee   TOTAL KNEE ARTHROPLASTY Right 03/20/2018   Procedure: RIGHT TOTAL KNEE ARTHROPLASTY;  Surgeon: Eugenia Mcalpine, MD;  Location: [facility];  Service: Orthopedics;  Laterality: Right;   TOTAL KNEE ARTHROPLASTY Left 09/11/2018   Procedure: LEFT TOTAL KNEE ARTHROPLASTY;  Surgeon: Eugenia Mcalpine, MD;   Location: [facility];  Service: Orthopedics;  Laterality: Left;   TUBAL LIGATION  1966    Family History  Problem Relation Age of Onset   Heart disease Father        died age 42- MI   Alcohol abuse Brother    Heart disease Brother    Heart disease Sister    Breast cancer Neg Hx     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    Comment: [employer]  Tobacco Use   Smoking status: Never    Passive exposure: Never   Smokeless tobacco: Never  Vaping Use   Vaping status: Never Used  Substance and Sexual Activity   Alcohol use: No   Drug use: No   Sexual activity: Not Currently  Other Topics Concern   Not on file  Social History Narrative   Has living will   Designated son Ronny Flurry and other children to make health care decisions for her   Would accept CPR/resuscitation   No prolonged tube feeds (if cognitively unaware)   Social Determinants of Health   Financial Resource Strain: Low Risk  (08/18/2021)   Overall Financial Resource Strain (CARDIA)    Difficulty of Paying Living Expenses: Not hard at all  Food Insecurity: No Food Insecurity (11/07/2023)   Hunger Vital Sign    Worried About Running Out of Food in the Last Year: Never true    Ran Out of Food in the Last Year: Never true  Transportation Needs: No Transportation Needs (11/07/2023)   PRAPARE - Administrator, Civil Service (Medical): No    Lack of Transportation (Non-Medical): No  Physical Activity: Insufficiently Active (08/18/2021)   Exercise Vital Sign    Days of Exercise per Week: 4 days    Minutes of Exercise per Session: 10 min  Stress: No Stress Concern Present (08/18/2021)   [employer] of Occupational Health - Occupational Stress Questionnaire    Feeling of Stress :  Only a little  Social Connections: Moderately Isolated (08/18/2021)   Social Connection and Isolation Panel [NHANES]    Frequency of Communication with Friends and Family: More than three times a week    Frequency of Social Gatherings with Friends and Family: Never    Attends Religious Services: 1 to 4 times per year    Active Member of [employer] or Organizations: No    Attends Banker Meetings: Never    Marital Status: Divorced  Catering manager Violence: Not At Risk (11/07/2023)   Humiliation, Afraid, Rape, and Kick questionnaire    Fear of Current or Ex-Partner: No  Emotionally Abused: No    Physically Abused: No    Sexually Abused: No   Review of Systems Did okay with therapy evaluations at hospital before discharge    Objective:   Physical Exam Constitutional:      Appearance: Normal appearance.  Cardiovascular:     Rate and Rhythm: Normal rate. Rhythm irregular.     Heart sounds:     No gallop.     Comments: Gr 3/6 systolic murmur Pulmonary:     Effort: Pulmonary effort is normal.     Breath sounds: Normal breath sounds. No wheezing or rales.  Musculoskeletal:     Cervical back: Neck supple.     Right lower leg: No edema.     Left lower leg: No edema.  Lymphadenopathy:     Cervical: No cervical adenopathy.  Neurological:     Mental Status: She is alert.            Assessment & Plan:

## 2023-11-11 ENCOUNTER — Other Ambulatory Visit (HOSPITAL_COMMUNITY): Payer: Self-pay

## 2023-11-11 ENCOUNTER — Telehealth: Payer: Self-pay | Admitting: *Deleted

## 2023-11-11 NOTE — Progress Notes (Signed)
  Care Coordination   Note   11/11/2023 Name: Autumn Price MRN: [id number] DOB: 09/12/1941  Autumn Price is a 83 y.o. year old female who sees Karie Schwalbe, MD for primary care. I reached out to [name] by phone today to offer care coordination services.  Autumn Price was given information about Care Coordination services today including:   The Care Coordination services include support from the care team which includes your Nurse Coordinator, Clinical Social Worker, or Pharmacist.  The Care Coordination team is here to help remove barriers to the health concerns and goals most important to you. Care Coordination services are voluntary, and the patient may decline or stop services at any time by request to their care team member.   Care Coordination Consent Status: Patient did not agree to participate in care coordination services at this time.  Follow up plan:  pt appreciative but declined services at this time   Encounter Outcome:  Patient Refused  Burman Nieves, [facility] Care Coordination Care Guide Direct Dial: [phone]

## 2023-11-11 NOTE — Transitions of Care (Post Inpatient/ED Visit) (Signed)
 11/11/2023  Name: Autumn Price MRN: [id number] DOB: 1941/10/28  Today's TOC FU Call Status: Today's TOC FU Call Status:: Successful TOC FU Call Completed Unsuccessful Call (1st Attempt) Date: 11/10/23 [facility] FU Call Complete Date: 11/11/23 Patient's Name and Date of Birth confirmed.  Transition Care Management Follow-up Telephone Call Date of Discharge: 11/07/23 Discharge Facility: [facility]) Type of Discharge: Inpatient Admission Primary Inpatient Discharge Diagnosis:: dysarthria and anarthria How have you been since you were released from the hospital?: Better Any questions or concerns?: No  Items Reviewed: Did you receive and understand the discharge instructions provided?: Yes Medications obtained,verified, and reconciled?: Yes (Medications Reviewed) Any new allergies since your discharge?: No Dietary orders reviewed?: Yes Type of Diet Ordered:: heart healthy Do you have support at home?: Yes People in Home: grandchild(ren)  Medications Reviewed Today: Medications Reviewed Today     Reviewed by Khadija Thier, Jordan Hawks, CMA (Certified Medical Assistant) on 11/11/23 at 1521  Med List Status: <None>   Medication Order Taking? Sig Documenting Provider Last Dose Status Informant  acetaminophen (TYLENOL) 500 MG tablet [id number] Yes Take 500 mg by mouth every 6 (six) hours as needed for mild pain or moderate pain. [provider] Taking Active Self  atorvastatin (LIPITOR) 20 MG tablet [id number] Yes Take 1 tablet (20 mg total) by mouth daily. Delfino Lovett, MD Taking Active   Budeson-Glycopyrrol-Formoterol (BREZTRI AEROSPHERE) 160-9-4.8 MCG/ACT Sandrea Matte [id number] Yes Inhale 2 puffs into the lungs in the morning and at bedtime. Salena Saner, MD Taking Active Self  budesonide (PULMICORT) 0.5 MG/2ML nebulizer solution [id number] Yes Take 2 mLs (0.5 mg total) by nebulization in the morning and at bedtime.  Patient taking differently: Take 0.5 mg by  nebulization in the morning and at bedtime. Taking as needed.   Glenford Bayley, NP Taking Active Self  ELIQUIS 5 MG TABS tablet [id number] Yes Take 5 mg by mouth 2 (two) times daily. [provider] Taking Active Self  fluticasone (FLONASE) 50 MCG/ACT nasal spray [id number] Yes PLACE 1 SPRAY INTO BOTH NOSTRILS DAILY AS NEEDED FOR ALLERGIES OR RHINITIS. Glenford Bayley, NP Taking Active Self  furosemide (LASIX) 40 MG tablet [id number] Yes Take 40 mg by mouth daily. [provider] Taking Active Self  ketoconazole (NIZORAL) 2 % cream [id number] Yes APPLY 1 APPLICATION TOPICALLY TO GROIN AREA 2 TIMES A DAY AS NEEDED FOR IRRITATION Karie Schwalbe, MD Taking Active Self  levalbuterol [facility] HFA) 45 MCG/ACT inhaler [id number] Yes Inhale 2 puffs into the lungs every 6 (six) hours as needed for wheezing. Salena Saner, MD Taking Active Self  metoprolol succinate (TOPROL XL) 25 MG 24 hr tablet [id number] Yes Take 1.5 tablets (37.5 mg total) by mouth 2 (two) times daily. Lanier Prude, MD Taking Active Self  mirabegron ER (MYRBETRIQ) 50 MG TB24 tablet [id number] Yes TAKE 1 TABLET ONCE DAILY Karie Schwalbe, MD Taking Active Self  montelukast (SINGULAIR) 10 MG tablet [id number] Yes Take 1 tablet (10 mg total) by mouth at bedtime. Glenford Bayley, NP Taking Active Self  pantoprazole (PROTONIX) 40 MG tablet [id number] Yes Take 1 tablet (40 mg total) by mouth daily. Salena Saner, MD Taking Active Self  sertraline (ZOLOFT) 100 MG tablet [id number] Yes Take 1 tablet (100 mg total) by mouth daily. Karie Schwalbe, MD Taking Active Self  traMADol Janean Sark) 50 MG tablet [id number] Yes Take 1-2 tablets (50-100 mg total) by mouth every 12 (twelve) hours as needed. Karie Schwalbe, MD Taking  Active Self            Home Care and Equipment/Supplies: Were Home Health Services Ordered?: NA Any new equipment or medical supplies ordered?: NA  Functional Questionnaire: Do you  need assistance with bathing/showering or dressing?: No Do you need assistance with meal preparation?: No Do you need assistance with eating?: No Do you have difficulty maintaining continence: No Do you need assistance with getting out of bed/getting out of a chair/moving?: No Do you have difficulty managing or taking your medications?: No  Follow up appointments reviewed: PCP Follow-up appointment confirmed?: Yes Date of PCP follow-up appointment?: 11/10/23 Follow-up Provider: Dr. Alphonsus Sias Specialist [facility] Follow-up appointment confirmed?: Yes Date of Specialist follow-up appointment?: 11/20/23 Follow-Up Specialty Provider:: cardiology Do you need transportation to your follow-up appointment?: No Do you understand care options if your condition(s) worsen?: Yes-patient verbalized understanding     Lexus Shampine, CMA  [facility] AWV Team Direct Dial: [phone]

## 2023-11-13 DIAGNOSIS — I5032 Chronic diastolic (congestive) heart failure: Secondary | ICD-10-CM | POA: Diagnosis not present

## 2023-11-13 DIAGNOSIS — J40 Bronchitis, not specified as acute or chronic: Secondary | ICD-10-CM | POA: Diagnosis not present

## 2023-11-13 DIAGNOSIS — J9611 Chronic respiratory failure with hypoxia: Secondary | ICD-10-CM | POA: Diagnosis not present

## 2023-11-13 DIAGNOSIS — G4733 Obstructive sleep apnea (adult) (pediatric): Secondary | ICD-10-CM | POA: Diagnosis not present

## 2023-11-14 ENCOUNTER — Other Ambulatory Visit: Payer: Self-pay | Admitting: Internal Medicine

## 2023-11-14 ENCOUNTER — Other Ambulatory Visit (HOSPITAL_COMMUNITY): Payer: Self-pay

## 2023-11-14 NOTE — Telephone Encounter (Signed)
Pharmacy Patient Advocate Encounter  Received notification from [facility] that Prior Authorization for wegovy has been APPROVED from 11/12/23 to 12/15/24. Ran test claim, Copay is $0.00- one month. This test claim was processed through [facility]- copay amounts may vary at other pharmacies due to pharmacy/plan contracts, or as the patient moves through the different stages of their insurance plan.   PA #/Case ID/Reference #: [id number]

## 2023-11-17 NOTE — Telephone Encounter (Signed)
Previously 2 appointments were canceled ( Nov 5 and Nov 25) so call to rescheduled for [facility] education and initiation on Jan 07 at 1:30.

## 2023-11-17 NOTE — Telephone Encounter (Signed)
Last filled 10-16-23 Last OV 10-21-23 Next OV 01-09-24 [facility]

## 2023-11-18 ENCOUNTER — Ambulatory Visit: Payer: 59 | Admitting: Pulmonary Disease

## 2023-11-20 DIAGNOSIS — R0602 Shortness of breath: Secondary | ICD-10-CM | POA: Diagnosis not present

## 2023-11-20 DIAGNOSIS — R609 Edema, unspecified: Secondary | ICD-10-CM | POA: Diagnosis not present

## 2023-11-20 DIAGNOSIS — I4892 Unspecified atrial flutter: Secondary | ICD-10-CM | POA: Diagnosis not present

## 2023-12-13 DIAGNOSIS — I5032 Chronic diastolic (congestive) heart failure: Secondary | ICD-10-CM | POA: Diagnosis not present

## 2023-12-13 DIAGNOSIS — G4733 Obstructive sleep apnea (adult) (pediatric): Secondary | ICD-10-CM | POA: Diagnosis not present

## 2023-12-18 ENCOUNTER — Other Ambulatory Visit: Payer: Self-pay | Admitting: Internal Medicine

## 2023-12-18 NOTE — Telephone Encounter (Signed)
 Last filled 11-17-23 #120 Last OV 11-10-23 Next OV 01-09-24 [facility]

## 2023-12-23 ENCOUNTER — Ambulatory Visit: Payer: 59

## 2024-01-01 ENCOUNTER — Encounter: Payer: Self-pay | Admitting: Pulmonary Disease

## 2024-01-01 ENCOUNTER — Ambulatory Visit: Payer: 59 | Admitting: Pulmonary Disease

## 2024-01-01 ENCOUNTER — Other Ambulatory Visit: Payer: Self-pay | Admitting: Pulmonary Disease

## 2024-01-01 VITALS — BP 138/66 | HR 55 | Temp 97.6°F | Ht 64.0 in | Wt 278.0 lb

## 2024-01-01 DIAGNOSIS — I480 Paroxysmal atrial fibrillation: Secondary | ICD-10-CM | POA: Diagnosis not present

## 2024-01-01 DIAGNOSIS — R600 Localized edema: Secondary | ICD-10-CM | POA: Diagnosis not present

## 2024-01-01 DIAGNOSIS — J4489 Other specified chronic obstructive pulmonary disease: Secondary | ICD-10-CM | POA: Diagnosis not present

## 2024-01-01 DIAGNOSIS — R0602 Shortness of breath: Secondary | ICD-10-CM

## 2024-01-01 DIAGNOSIS — I471 Supraventricular tachycardia, unspecified: Secondary | ICD-10-CM

## 2024-01-01 LAB — NITRIC OXIDE: Nitric Oxide: 32

## 2024-01-01 MED ORDER — BREZTRI AEROSPHERE 160-9-4.8 MCG/ACT IN AERO: 2.0000 | INHALATION_SPRAY | Freq: Two times a day (BID) | RESPIRATORY_TRACT | 11 refills | Status: DC

## 2024-01-01 NOTE — Progress Notes (Signed)
Subjective:    Patient ID: Autumn Price, female    DOB: [birth year], 83 y.o.   MRN: [id number]  Patient Care Team: Karie Schwalbe, MD as PCP - General Quintella Reichert, MD as PCP - Sleep Medicine (Cardiology) Lanier Prude, MD as PCP - Electrophysiology (Cardiology) Salena Saner, MD as Consulting Physician (Pulmonary Disease) Melton Alar Rozell Searing, DO as Referring Physician (Cardiology)  Chief Complaint  Patient presents with   Follow-up    Shortness of breath on exertion and rest. Cough and wheezing.     BACKGROUND/INTERVAL:Autumn Price is a 83 year old morbidly obese, lifelong never smoker who presents for follow-up visit on the issue of moderate persistent asthma/asthmatic bronchitis.  I last saw her on 30 June 2023, at that time she was complaining of tachypalpitations and was set up for a Zio patch.  She has since then diagnosed with paroxysmal atrial fibrillation and is being followed by cardiology at [facility] currently.  She is on anticoagulation  HPI Discussed the use of AI scribe software for clinical note transcription with the patient, who gave verbal consent to proceed.  History of Present Illness   Autumn Price, a patient with a history of asthmatic bronchitis, moderate persistent asthma, nocturnal hypoxemia, and obstructive sleep apnea, presents for follow-up. The patient has been unable to use CPAP and is supposed to be on nocturnal oxygen due to hypoxemia related to asthma and morbid obesity with hypoventilation.  The patient was hospitalized in November, with a suspected mini-stroke. The patient reports no memory of the event. Around the same time, the patient experienced issues with a rapid heart rhythm. After changing doctors, the patient was diagnosed with atrial fibrillation and started on anticoagulation and her diuretic medications were adjusted. However, the patient has not taken diuretics since Sunday due to concerns about incontinence. The patient reports  noticing increased leg swelling since stopping the medication.  The patient has been using the [facility] inhaler and reports that it works well. However, the patient experiences wheezing at night and has a cough that sounds like croup. The patient uses a nebulizer when the coughing becomes severe, which helps to alleviate symptoms.  She has not used her rescue inhaler as previously advised.  The patient reports not using the oxygen all the time.  The patient reports occasional reflux, which can cause coughing, particularly when lying down. The patient is on Protonix for this. The patient also reports that she often has a coughing spell while eating, which can lead to vomiting.  The patient has been experiencing urinary incontinence and is on Myrbetriq for this. However, the patient reports that the medication does not seem to be helping.  The patient's nitric oxide level was 32, indicating that the level of inflammation in the airway is controlled.   Patient presents today with her grandson who seems to be invested in her care.     DATA: 10/13/2019 2D echo: LVEF 60 to 65%, essentially normal, no pulmonary hypertension 06/05/2020 overnight oximetry: Shows desaturations to 72% throughout the course of the night with a high oxygen desaturation index/sleep disturbance (24 events per hour) 06/27/2020 PFTs: FEV1 1.86 L or 93% predicted, FVC 2.28 L or 86% predicted.  FEV1/FVC 82%. No bronchodilator response.  Diffusion capacity not performed despite being ordered.  No obstructive defect.  No restrictive defect 10/09/2020 Home sleep study: Desaturations noted during study as low as 81%.  Average of 91%.  AHI was 15.5, consistent with moderate obstructive sleep apnea 10/17/2020 nuclear stress test: Low  risk study, sensitivity reduced to the patient's body habitus, coronary artery calcifications noted on attenuation correction CT. 06/03/2022 chest x-ray PA and lateral: No active cardiopulmonary  disease. 02/21/2022 CBC with differential/IgE: CBC normal with no eosinophilia, IgE 8 IU/mL (normal). 02/04/2023 PFTs: FEV1 1.69 L or 90% predicted, FVC 2.09 L or 82% predicted, FEV1/FVC 81%, no significant bronchodilator response, diffusion capacity normal.  Consistent with mild obstructive defect. 07/21/2023 Zio patch report: Episodes of PSVT and atrial flutter. 07/24/2023 echocardiogram: LVEF 55 to 60% normal right-sided structures, mild mitral valve regurgitation, mild aortic sclerosis with no stenosis.  Review of Systems A 10 point review of systems was performed and it is as noted above otherwise negative.   Patient Active Problem List   Diagnosis Date Noted   Paroxysmal atrial fibrillation (HCC) 11/10/2023   History of TIA (transient ischemic attack) 11/10/2023   Daytime somnolence 11/10/2023   Dysarthria 11/06/2023   Paroxysmal SVT (supraventricular tachycardia) (HCC) 11/06/2023   Chronic anticoagulation 11/06/2023   MDD (major depressive disorder), single episode, mild (HCC) 06/25/2023   Moderate persistent asthma without complication 01/13/2023   GERD (gastroesophageal reflux disease) 09/03/2022   Hx of sleep apnea 09/03/2022   Eosinophilic bronchitis 06/14/2022   Thrombocytopenia (HCC) 01/03/2022   Chronic hypoxemic respiratory failure (HCC) 01/02/2022   Stage 3a chronic kidney disease (HCC) 01/02/2022   Chronic narcotic dependence (HCC) 01/02/2022   Persistent cough 05/11/2020   Chronic diastolic heart failure (HCC) 10/25/2019   DOE (dyspnea on exertion) 10/11/2019   Primary osteoarthritis of right knee 03/20/2018   S/P knee replacement 03/20/2018   Osteoarthritis of left knee 08/30/2016   Advance directive discussed with patient 08/04/2015   Mood disorder (HCC) 06/10/2014   Morbid obesity (HCC) 05/22/2012   Routine general medical examination at a health care facility 03/19/2011   Mixed stress and urge urinary incontinence 03/19/2011   Essential hypertension, benign  08/21/2010   Chronic venous insufficiency 08/21/2010   Osteoarthritis, multiple sites 08/21/2010    Social History   Tobacco Use   Smoking status: Never    Passive exposure: Never   Smokeless tobacco: Never  Substance Use Topics   Alcohol use: No    No Known Allergies  Current Meds  Medication Sig   acetaminophen (TYLENOL) 500 MG tablet Take 500 mg by mouth every 6 (six) hours as needed for mild pain or moderate pain.   atorvastatin (LIPITOR) 20 MG tablet Take 1 tablet (20 mg total) by mouth daily.   Budeson-Glycopyrrol-Formoterol (BREZTRI AEROSPHERE) 160-9-4.8 MCG/ACT AERO Inhale 2 puffs into the lungs in the morning and at bedtime.   budesonide (PULMICORT) 0.5 MG/2ML nebulizer solution Take 2 mLs (0.5 mg total) by nebulization in the morning and at bedtime. (Patient taking differently: Take 0.5 mg by nebulization in the morning and at bedtime. Taking as needed.)   ELIQUIS 5 MG TABS tablet Take 5 mg by mouth 2 (two) times daily.   fluticasone (FLONASE) 50 MCG/ACT nasal spray PLACE 1 SPRAY INTO BOTH NOSTRILS DAILY AS NEEDED FOR ALLERGIES OR RHINITIS.   furosemide (LASIX) 40 MG tablet Take 40 mg by mouth daily.   ketoconazole (NIZORAL) 2 % cream APPLY 1 APPLICATION TOPICALLY TO GROIN AREA 2 TIMES A DAY AS NEEDED FOR IRRITATION   levalbuterol (XOPENEX HFA) 45 MCG/ACT inhaler Inhale 2 puffs into the lungs every 6 (six) hours as needed for wheezing.   metoprolol succinate (TOPROL XL) 25 MG 24 hr tablet Take 1.5 tablets (37.5 mg total) by mouth 2 (two) times daily.  mirabegron ER (MYRBETRIQ) 50 MG TB24 tablet TAKE 1 TABLET ONCE DAILY   montelukast (SINGULAIR) 10 MG tablet Take 1 tablet (10 mg total) by mouth at bedtime.   pantoprazole (PROTONIX) 40 MG tablet Take 1 tablet (40 mg total) by mouth daily.   sertraline (ZOLOFT) 100 MG tablet Take 1 tablet (100 mg total) by mouth daily.   traMADol (ULTRAM) 50 MG tablet Take 1-2 tablets (50-100 mg total) by mouth every 12 (twelve) hours as  needed.    Immunization History  Administered Date(s) Administered   Fluad Quad(high Dose 65+) 10/11/2020   Influenza Inj Mdck Quad Pf 10/30/2018, 09/25/2019   Influenza, Seasonal, Injecte, Preservative Fre 09/30/2016   Influenza,inj,Quad PF,6+ Mos 09/12/2017   Influenza-Unspecified 09/12/2021, 09/19/2022, 10/09/2023   Pneumococcal Conjugate-13 06/10/2014   Pneumococcal Polysaccharide-23 01/16/2009, 08/12/2017   Td 08/21/2010   Tdap 09/19/2022   Zoster Recombinant(Shingrix) 09/30/2019, 01/11/2020   Zoster, Live 09/18/2016        Objective:     BP 138/66 (BP Location: Right Arm, Patient Position: Sitting, Cuff Size: Normal)   Pulse (!) 55   Temp 97.6 F (36.4 C) (Temporal)   Ht 5\' 4"  (1.626 m)   Wt 278 lb (126.1 kg)   SpO2 96%   BMI 47.72 kg/m   SpO2: 96 %  GENERAL: Morbidly obese woman, no acute distress, presents in transport chair, no conversational dyspnea.  HEAD: Normocephalic, atraumatic.  EYES: Pupils equal, round, reactive to light.  No scleral icterus.  MOUTH: Dentition intact, oral mucosa moist. NECK: Supple. No thyromegaly. Trachea midline. No JVD.  No adenopathy. PULMONARY: Good air entry bilaterally.  No adventitious sounds. CARDIOVASCULAR: S1 and S2. Regular rate and rhythm.  No rubs, murmurs or gallops heard. ABDOMEN: Obese otherwise benign. MUSCULOSKELETAL: No joint deformity, no clubbing, no edema.  NEUROLOGIC: No focal deficit, no gait disturbance, speech is fluent. SKIN: Intact,warm,dry.  Limited exam shows no rashes PSYCH: Mood appropriate, behavior normal.   Lab Results  Component Value Date   NITRICOXIDE 32 01/01/2024      Assessment & Plan:     ICD-10-CM   1. Chronic asthmatic bronchitis (HCC)  J44.89     2. Shortness of breath  R06.02 Nitric oxide    3. Paroxysmal atrial fibrillation (HCC)  I48.0     4. Peripheral edema  R60.0       Orders Placed This Encounter  Procedures   Nitric oxide   Discussion:    Moderate  Persistent Asthma with Asthmatic Bronchitis Moderate persistent asthma with asthmatic bronchitis, characterized by nocturnal wheezing and coughing, relieved by nebulizer treatments. Inconsistent use of Breztri and rescue inhalers. Inflammation controlled (nitric oxide level: 32). Vocal cord dysfunction contributing to wheezing. Discussed consistent inhaler use and benefits of breathing exercises. - Use rescue inhaler at night for wheezing - Use nebulizer treatment in the evening if wheezing persists - Continue Breztri inhaler twice daily - Refills sent to pharmacy - Encourage pursed lip breathing exercises - Consider using a harmonica for breathing exercises - Use a wedge pillow to elevate head during sleep - Continue Protonix for reflux - Recommend smaller, more frequent meals to manage reflux  Nocturnal Hypoxemia Nocturnal hypoxemia related to asthma and morbid obesity with hypoventilation. Inconsistent use of nocturnal oxygen. Emphasized importance for heart relaxation and airway management. - Use nocturnal oxygen consistently - Educate on importance of nocturnal oxygen for heart relaxation and airway management  Obstructive Sleep Apnea Moderate obstructive sleep apnea. Intolerant to CPAP therapy. Using nocturnal oxygen as an alternative. -  Use nocturnal oxygen as an alternative to CPAP  Atrial Fibrillation/flutter Atrial fibrillation diagnosed via Zio patch. On anticoagulant therapy to reduce stroke risk. Discussed increased stroke risk without anticoagulation and importance of continuing therapy. - Continue anticoagulant therapy - Continue cardiology follow-up  Peripheral Edema Peripheral edema likely due to fluid retention. Recently not taking double dose Lasix due to incontinence concerns. Discussed importance of managing fluid retention to alleviate shortness of breath and need for appropriately fitting shoes. - Resume Lasix as prescribed - Monitor for fluid retention and  shortness of breath - Encourage use of appropriately fitting shoes  Urinary Incontinence Urinary incontinence not well managed with Myrbetriq. Advised against seeing a urologist by current physician. Discussed potential benefits of urologist evaluation and management. - Recommended referral to urologist for further evaluation and management, defer to primary care - Consider changing primary care physician due to upcoming retirement of current physician  General Health Maintenance Discussed importance of weight loss for overall health and asthma management. Recent 10-pound weight loss with double dose Lasix. Emphasized benefits of portion control, healthy eating habits, avoiding white carbohydrates, and increasing intake of protein, fiber, and vegetables. - Encourage portion control and healthy eating habits - Avoid white carbohydrates - Increase intake of protein, fiber, and vegetables  Follow-up - Schedule follow-up appointment in three months (early April).      Advised if symptoms do not improve or worsen, to please contact office for sooner follow up or seek emergency care.    I spent 40 minutes of dedicated to the care of this patient on the date of this encounter to include pre-visit review of records, face-to-face time with the patient discussing conditions above, post visit ordering of testing, clinical documentation with the electronic health record, making appropriate referrals as documented, and communicating necessary findings to members of the patients care team.     C. Danice Goltz, MD Advanced Bronchoscopy P[facility]    *This note was generated using voice recognition software/Dragon and/or AI transcription program.  Despite best efforts to proofread, errors can occur which can change the meaning. Any transcriptional errors that result from this process are unintentional and may not be fully corrected at the time of dictation.

## 2024-01-01 NOTE — Patient Instructions (Signed)
VISIT SUMMARY:  During your follow-up visit, we discussed your ongoing health issues, including asthma, nocturnal hypoxemia, obstructive sleep apnea, atrial fibrillation, peripheral edema, and urinary incontinence. We reviewed your current treatments and made some adjustments to better manage your symptoms and improve your overall health.  YOUR PLAN:  -MODERATE PERSISTENT ASTHMA WITH ASTHMATIC BRONCHITIS: Moderate persistent asthma with asthmatic bronchitis involves chronic inflammation and narrowing of the airways, causing wheezing and coughing. Continue using the Breztri inhaler twice daily, use the rescue inhaler at night for wheezing, and use the nebulizer in the evening if wheezing persists. Practice pursed lip breathing exercises and consider using a harmonica for these exercises. Use a wedge pillow to elevate your head during sleep and continue taking Protonix for reflux. Eat smaller, more frequent meals to manage reflux.  -NOCTURNAL HYPOXEMIA: Nocturnal hypoxemia is low oxygen levels during sleep, often related to asthma and obesity. Use nocturnal oxygen consistently to help relax your heart and manage your airways.  -OBSTRUCTIVE SLEEP APNEA: Obstructive sleep apnea is a condition where breathing stops and starts during sleep. Since you are intolerant to CPAP therapy, use nocturnal oxygen as an alternative.  -ATRIAL FIBRILLATION/FLUTTER: Atrial fibrillation is an irregular and often rapid heart rate that can increase the risk of stroke. Continue your anticoagulant therapy to reduce the risk of stroke.  -PERIPHERAL EDEMA: Peripheral edema is swelling caused by fluid retention. Resume taking Lasix as prescribed to manage fluid retention and alleviate shortness of breath. Monitor for fluid retention and ensure you wear appropriately fitting shoes.  -URINARY INCONTINENCE: Urinary incontinence is the loss of bladder control. Since Myrbetriq is not helping, we recommend seeing a urologist for  further evaluation and management. Consider changing your primary care physician due to the upcoming retirement of your current physician.  -GENERAL HEALTH MAINTENANCE: Maintaining a healthy weight is important for overall health and asthma management. Continue practicing portion control, healthy eating habits, avoiding white carbohydrates, and increasing your intake of protein, fiber, and vegetables.  INSTRUCTIONS:  Please schedule a follow-up appointment in three months (early April).

## 2024-01-09 ENCOUNTER — Encounter: Payer: Self-pay | Admitting: Internal Medicine

## 2024-01-09 ENCOUNTER — Ambulatory Visit: Payer: 59 | Admitting: Internal Medicine

## 2024-01-09 VITALS — BP 120/80 | HR 77 | Temp 97.6°F | Ht 63.5 in | Wt 278.0 lb

## 2024-01-09 DIAGNOSIS — J9611 Chronic respiratory failure with hypoxia: Secondary | ICD-10-CM | POA: Diagnosis not present

## 2024-01-09 DIAGNOSIS — Z0000 Encounter for general adult medical examination without abnormal findings: Secondary | ICD-10-CM

## 2024-01-09 DIAGNOSIS — F1120 Opioid dependence, uncomplicated: Secondary | ICD-10-CM

## 2024-01-09 DIAGNOSIS — F320 Major depressive disorder, single episode, mild: Secondary | ICD-10-CM | POA: Diagnosis not present

## 2024-01-09 DIAGNOSIS — I480 Paroxysmal atrial fibrillation: Secondary | ICD-10-CM

## 2024-01-09 DIAGNOSIS — Z8673 Personal history of transient ischemic attack (TIA), and cerebral infarction without residual deficits: Secondary | ICD-10-CM

## 2024-01-09 DIAGNOSIS — I5032 Chronic diastolic (congestive) heart failure: Secondary | ICD-10-CM

## 2024-01-09 MED ORDER — PANTOPRAZOLE SODIUM 40 MG PO TBEC: 40.0000 mg | DELAYED_RELEASE_TABLET | Freq: Every day | ORAL | 3 refills | Status: AC

## 2024-01-09 MED ORDER — ATORVASTATIN CALCIUM 20 MG PO TABS: 20.0000 mg | ORAL_TABLET | Freq: Every day | ORAL | 3 refills | Status: DC

## 2024-01-09 NOTE — Assessment & Plan Note (Signed)
On eliquis and recommended continuing the atorvastatin

## 2024-01-09 NOTE — Progress Notes (Signed)
Hearing Screening - Comments:: Did not pass whisper test Vision Screening - Comments:: February 2024

## 2024-01-09 NOTE — Assessment & Plan Note (Signed)
Uses the tramadol 4 per day for chronic back pain PDMP reviewed ---no concerns

## 2024-01-09 NOTE — Assessment & Plan Note (Signed)
I have personally reviewed the Medicare Annual Wellness questionnaire and have noted 1. The patient's medical and social history 2. Their use of alcohol, tobacco or illicit drugs 3. Their current medications and supplements 4. The patient's functional ability including ADL's, fall risks, home safety risks and hearing or visual             impairment. 5. Diet and physical activities 6. Evidence for depression or mood disorders  The patients weight, height, BMI and visual acuity have been recorded in the chart I have made referrals, counseling and provided education to the patient based review of the above and I have provided the pt with a written personalized care plan for preventive services.  I have provided you with a copy of your personalized plan for preventive services. Please take the time to review along with your updated medication list.  Done with cancer screening Prefers no COVID vaccine Had flu vaccine Recommended RSV Discussed--at least try chair exercises

## 2024-01-09 NOTE — Assessment & Plan Note (Signed)
Rate is controlled on metoprolol 37.5 bid On eliquis 5 bid

## 2024-01-09 NOTE — Assessment & Plan Note (Signed)
Now in remission with increased sertraline---100 mg daily Will continue indefinitely

## 2024-01-09 NOTE — Assessment & Plan Note (Signed)
Compensated on the furosemide 40mg  daily Metoprolol  also

## 2024-01-09 NOTE — Assessment & Plan Note (Addendum)
Uses the oxygen at night On breztri also Protonix per Dr Jayme Cloud

## 2024-01-09 NOTE — Progress Notes (Signed)
Subjective:    Patient ID: Autumn Price, female    DOB: 1941-09-18, 83 y.o.   MRN: [id number]  HPI Here for Medicare wellness visit and follow up of chronic health conditions Reviewed advanced directives Reviewed other doctors---Dr Wyn Quaker, Dr Custovic--cardiology, Dr Darin Engels doctor, Ms Manson Passey (NP)--vascular, Dr Roseanne Reno, Dr Blanchie Serve No surgery in past year Not able to exercise Vision is okay Hearing is fine No alcohol or tobacco No falls Chronic mood issues Independent with instrumental  ADLs No sig memory issues  Hospitalized in November--had TIA Sedation Relatively new atrial fibrillation--now on eliquis Occasionally feels heart beating in throat--not often No chest pain Some edema--still on the furosemide  Still with easy SOB Has to take it slow when shopping. Needs to do house cleaning in small bursts Some cough Does use the oxygen at night  Chronic low platelets Easy bruising --but just on forearms  Depression has been mostly controlled Occasional feelings of "blah"---just briefly Not anhedonic--loves her sewing/cross stitch. Prefers to just stay in (due to DOE)  Is on the protonix From Dr Jayme Cloud No heartburn or dysphagia  Still with urinary incontinence--despite the mirabegron Discussed trial without it  Weight fairly stable BMI still 47  Current Outpatient Medications on File Prior to Visit  Medication Sig Dispense Refill   acetaminophen (TYLENOL) 500 MG tablet Take 500 mg by mouth every 6 (six) hours as needed for mild pain or moderate pain.     Budeson-Glycopyrrol-Formoterol (BREZTRI AEROSPHERE) 160-9-4.8 MCG/ACT AERO Inhale 2 puffs into the lungs in the morning and at bedtime. 10.7 g 11   ELIQUIS 5 MG TABS tablet Take 5 mg by mouth 2 (two) times daily.     fluticasone (FLONASE) 50 MCG/ACT nasal spray PLACE 1 SPRAY INTO BOTH NOSTRILS DAILY AS NEEDED FOR ALLERGIES OR RHINITIS. 48 mL 1   furosemide (LASIX) 40 MG tablet Take  40 mg by mouth daily.     ketoconazole (NIZORAL) 2 % cream APPLY 1 APPLICATION TOPICALLY TO GROIN AREA 2 TIMES A DAY AS NEEDED FOR IRRITATION 30 g 3   levalbuterol (XOPENEX HFA) 45 MCG/ACT inhaler Inhale 2 puffs into the lungs every 6 (six) hours as needed for wheezing. 15 g 11   metoprolol succinate (TOPROL XL) 25 MG 24 hr tablet Take 1.5 tablets (37.5 mg total) by mouth 2 (two) times daily. 270 tablet 3   mirabegron ER (MYRBETRIQ) 50 MG TB24 tablet TAKE 1 TABLET ONCE DAILY 90 tablet 3   montelukast (SINGULAIR) 10 MG tablet Take 1 tablet (10 mg total) by mouth at bedtime. 30 tablet 11   pantoprazole (PROTONIX) 40 MG tablet Take 1 tablet (40 mg total) by mouth daily. 30 tablet 0   sertraline (ZOLOFT) 100 MG tablet Take 1 tablet (100 mg total) by mouth daily. 90 tablet 3   traMADol (ULTRAM) 50 MG tablet Take 1-2 tablets (50-100 mg total) by mouth every 12 (twelve) hours as needed. 120 tablet 0   atorvastatin (LIPITOR) 20 MG tablet Take 1 tablet (20 mg total) by mouth daily. 30 tablet 0   No current facility-administered medications on file prior to visit.    No Known Allergies  Past Medical History:  Diagnosis Date   Depression    Hypertension    Osteoarthritis    Urge incontinence    Venous insufficiency     Past Surgical History:  Procedure Laterality Date   APPENDECTOMY  1966   DILATION AND CURETTAGE OF UTERUS  01/27/14   benign   JOINT REPLACEMENT  right knee   TOTAL KNEE ARTHROPLASTY Right 03/20/2018   Procedure: RIGHT TOTAL KNEE ARTHROPLASTY;  Surgeon: Eugenia Mcalpine, MD;  Location: [facility];  Service: Orthopedics;  Laterality: Right;   TOTAL KNEE ARTHROPLASTY Left 09/11/2018   Procedure: LEFT TOTAL KNEE ARTHROPLASTY;  Surgeon: Eugenia Mcalpine, MD;  Location: [facility];  Service: Orthopedics;  Laterality: Left;   TUBAL LIGATION  1966    Family History  Problem Relation Age of Onset   Heart disease Father        died age 4- MI   Alcohol abuse Brother    Heart disease  Brother    Heart disease Sister    Breast cancer Neg Hx     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    Comment: [employer]  Tobacco Use   Smoking status: Never    Passive exposure: Never   Smokeless tobacco: Never  Vaping Use   Vaping status: Never Used  Substance and Sexual Activity   Alcohol use: No   Drug use: No   Sexual activity: Not Currently  Other Topics Concern   Not on file  Social History Narrative   Has living will   Designated son Ronny Flurry and other children to make health care decisions for her   Would accept CPR/resuscitation   No prolonged tube feeds (if cognitively unaware)   Social Drivers of Health   Financial Resource Strain: Low Risk  (08/18/2021)   Overall Financial Resource Strain (CARDIA)    Difficulty of Paying Living Expenses: Not hard at all  Food Insecurity: No Food Insecurity (11/07/2023)   Hunger Vital Sign    Worried About Running Out of Food in the Last Year: Never true    Ran Out of Food in the Last Year: Never true  Transportation Needs: No Transportation Needs (11/07/2023)   PRAPARE - Administrator, Civil Service (Medical): No    Lack of Transportation (Non-Medical): No  Physical Activity: Insufficiently Active (08/18/2021)   Exercise Vital Sign    Days of Exercise per Week: 4 days    Minutes of Exercise per Session: 10 min  Stress: No Stress Concern Present (08/18/2021)   [employer] of Occupational Health - Occupational Stress Questionnaire    Feeling of Stress : Only a little  Social Connections: Moderately Isolated (08/18/2021)   Social Connection and Isolation Panel [NHANES]    Frequency of Communication with Friends and Family: More than three times a week    Frequency of Social  Gatherings with Friends and Family: Never    Attends Religious Services: 1 to 4 times per year    Active Member of [employer] or Organizations: No    Attends Banker Meetings: Never    Marital Status: Divorced  Catering manager Violence: Not At Risk (11/07/2023)   Humiliation, Afraid, Rape, and Kick questionnaire    Fear of Current or Ex-Partner: No    Emotionally Abused: No    Physically Abused: No    Sexually Abused: No   Review of Systems Doesn't eat a lot Weight stable Sleeps fair--occ bad nights Wears seat belt Recent cap on upper molar No suspicious skin lesions Bowels are fine---no blood Still has lower back pain Some left knee aching  at night--uses the tramadol for this (generally 4 per day). Also some tylenol    Objective:   Physical Exam Constitutional:      Appearance: Normal appearance. She is obese.  HENT:     Mouth/Throat:     Pharynx: No oropharyngeal exudate or posterior oropharyngeal erythema.  Eyes:     Conjunctiva/sclera: Conjunctivae normal.     Pupils: Pupils are equal, round, and reactive to light.  Cardiovascular:     Rate and Rhythm: Normal rate.     Pulses: Normal pulses.     Heart sounds: No murmur heard.    No gallop.     Comments: Regular at first--then slightly irregular Pulmonary:     Effort: Pulmonary effort is normal.     Breath sounds: Normal breath sounds. No wheezing or rales.  Abdominal:     Palpations: Abdomen is soft.     Tenderness: There is no abdominal tenderness.  Musculoskeletal:     Cervical back: Neck supple.     Right lower leg: No edema.     Left lower leg: No edema.  Lymphadenopathy:     Cervical: No cervical adenopathy.  Skin:    Findings: No rash.  Neurological:     General: No focal deficit present.     Mental Status: She is alert and oriented to person, place, and time.     Comments: Mini-cog----normal  Psychiatric:        Mood and Affect: Mood normal.        Behavior: Behavior normal.             Assessment & Plan:

## 2024-01-09 NOTE — Progress Notes (Signed)
Can you please call this patient to set up a new patient appointment before September.  Dr. Alphonsus Sias is retiring and it sounds like she would like to establish care with me.

## 2024-01-09 NOTE — Assessment & Plan Note (Signed)
Doesn't eat much Weight stable Discussed chair exercises

## 2024-01-11 DIAGNOSIS — J4489 Other specified chronic obstructive pulmonary disease: Secondary | ICD-10-CM | POA: Insufficient documentation

## 2024-01-13 DIAGNOSIS — G4733 Obstructive sleep apnea (adult) (pediatric): Secondary | ICD-10-CM | POA: Diagnosis not present

## 2024-01-13 DIAGNOSIS — I5032 Chronic diastolic (congestive) heart failure: Secondary | ICD-10-CM | POA: Diagnosis not present

## 2024-01-17 DIAGNOSIS — I5032 Chronic diastolic (congestive) heart failure: Secondary | ICD-10-CM | POA: Diagnosis not present

## 2024-01-17 DIAGNOSIS — G4733 Obstructive sleep apnea (adult) (pediatric): Secondary | ICD-10-CM | POA: Diagnosis not present

## 2024-01-20 ENCOUNTER — Other Ambulatory Visit: Payer: Self-pay | Admitting: Internal Medicine

## 2024-02-13 DIAGNOSIS — G4733 Obstructive sleep apnea (adult) (pediatric): Secondary | ICD-10-CM | POA: Diagnosis not present

## 2024-02-13 DIAGNOSIS — I5032 Chronic diastolic (congestive) heart failure: Secondary | ICD-10-CM | POA: Diagnosis not present

## 2024-02-19 ENCOUNTER — Other Ambulatory Visit: Payer: Self-pay | Admitting: Internal Medicine

## 2024-02-19 NOTE — Telephone Encounter (Signed)
 Last filled 01-20-24 #120 Last OV 01-09-24 No Future OV [facility]

## 2024-02-23 DIAGNOSIS — I4892 Unspecified atrial flutter: Secondary | ICD-10-CM | POA: Diagnosis not present

## 2024-02-23 DIAGNOSIS — R0602 Shortness of breath: Secondary | ICD-10-CM | POA: Diagnosis not present

## 2024-02-23 DIAGNOSIS — E782 Mixed hyperlipidemia: Secondary | ICD-10-CM | POA: Diagnosis not present

## 2024-02-23 DIAGNOSIS — I10 Essential (primary) hypertension: Secondary | ICD-10-CM | POA: Diagnosis not present

## 2024-03-12 DIAGNOSIS — G4733 Obstructive sleep apnea (adult) (pediatric): Secondary | ICD-10-CM | POA: Diagnosis not present

## 2024-03-12 DIAGNOSIS — I5032 Chronic diastolic (congestive) heart failure: Secondary | ICD-10-CM | POA: Diagnosis not present

## 2024-03-15 ENCOUNTER — Telehealth: Payer: Self-pay

## 2024-03-15 MED ORDER — MECLIZINE HCL 25 MG PO TABS: 25.0000 mg | ORAL_TABLET | Freq: Three times a day (TID) | ORAL | 2 refills | Status: DC | PRN

## 2024-03-15 NOTE — Telephone Encounter (Signed)
 Spoke to pt

## 2024-03-15 NOTE — Telephone Encounter (Signed)
 Copied from CRM [id number]. Topic: Clinical - Medical Advice >> Mar 15, 2024 11:19 AM Adaysia C wrote: Reason for CRM: Patient has requested a call back from the providers nurse about what medication can be prescribed for her current vertigo symptoms; please follow up with patient [phone]

## 2024-03-15 NOTE — Telephone Encounter (Signed)
 Let her know I sent the prescription It is OTC--but likely more money in small packages

## 2024-03-15 NOTE — Telephone Encounter (Signed)
 Started feeling lightheaded 2 days ago. Had a vomiting episode. Has had vertigo in the past. She does not have any meclizine. It expired. Send to [facility].

## 2024-03-15 NOTE — Addendum Note (Signed)
 Addended by: Tillman Abide I on: 03/15/2024 01:19 PM   Modules accepted: Orders

## 2024-03-18 ENCOUNTER — Ambulatory Visit: Payer: Self-pay

## 2024-03-18 NOTE — Telephone Encounter (Signed)
  Chief Complaint: blood vessel pooped in eye  Symptoms: blood shot eye   Disposition: [x] ED /[] Urgent Care (no appt availability in office) / [] Appointment(In office/virtual)/ []  [facility]/ [] Home Care/ [] Refused Recommended Disposition /[] [facility]/ []  Follow-up with PCP Additional Notes: Pt complaining of worsening bloody/popped vessel in eye. No discharge. Pt stated this started Tuesday and is worse today. Pt has had headache and had to take 2 Tramadol tablets today . PT is on blood thinner, Eliquis.   PT denies vision changes or pain. RN gave care advice and  advised pt go to ED and call for follow up app with PCP. Pt verbalized understanding and is going to ED.              Copied from CRM [id number]. Topic: Clinical - Red Word Triage >> Mar 18, 2024  1:17 PM Desma Mcgregor wrote: Red Word that prompted transfer to Nurse Triage: Blood vessel bursed in her left eye on Tuesday. It does not hurt but patient not sure if she needs to come in to be seen because its getting worse. Reason for Disposition  Patient sounds very sick or weak to the triager  Answer Assessment - Initial Assessment Questions 1. ONSET: "When did the pain start?" (e.g., minutes, hours, days)     No pain- blood shot  2. TIMING: "Does the pain come and go, or has it been constant since it started?" (e.g., constant, intermittent, fleeting)     Tuesday 4/1 3. SEVERITY: "How bad is the pain?"   (Scale 1-10; mild, moderate or severe)   - MILD (1-3): doesn't interfere with normal activities    - MODERATE (4-7): interferes with normal activities or awakens from sleep    - SEVERE (8-10): excruciating pain and patient unable to do normal activities     0 4. LOCATION: "Where does it hurt?"  (e.g., eyelid, eye, cheekbone)     Left eye  5 6. VISION: "Do you have blurred vision or changes in your vision?"      Denies any changes  7. EYE DISCHARGE: "Is there any discharge (pus) from the eye(s)?"  If  Yes, ask: "What color is it?"      Denies  8. FEVER: "Do you have a fever?" If Yes, ask: "What is it, how was it measured, and when did it start?"      Denies  9. OTHER SYMPTOMS: "Do you have any other symptoms?" (e.g., headache, nasal discharge, facial rash)     Blood shot  Protocols used: Eye Pain and Other Symptoms-A-AH

## 2024-03-18 NOTE — Telephone Encounter (Signed)
 Given the headache, etc----it seems prudent for ER visit so she can get appropriate imaging if needed Will await their evaluation

## 2024-03-19 DIAGNOSIS — I10 Essential (primary) hypertension: Secondary | ICD-10-CM | POA: Diagnosis not present

## 2024-03-19 DIAGNOSIS — H1132 Conjunctival hemorrhage, left eye: Secondary | ICD-10-CM | POA: Diagnosis not present

## 2024-03-19 DIAGNOSIS — I471 Supraventricular tachycardia, unspecified: Secondary | ICD-10-CM | POA: Diagnosis not present

## 2024-03-19 DIAGNOSIS — D696 Thrombocytopenia, unspecified: Secondary | ICD-10-CM | POA: Diagnosis not present

## 2024-03-19 DIAGNOSIS — I4892 Unspecified atrial flutter: Secondary | ICD-10-CM | POA: Diagnosis not present

## 2024-03-19 DIAGNOSIS — Z7901 Long term (current) use of anticoagulants: Secondary | ICD-10-CM | POA: Diagnosis not present

## 2024-03-19 DIAGNOSIS — I872 Venous insufficiency (chronic) (peripheral): Secondary | ICD-10-CM | POA: Diagnosis not present

## 2024-03-19 DIAGNOSIS — R0602 Shortness of breath: Secondary | ICD-10-CM | POA: Diagnosis not present

## 2024-03-19 DIAGNOSIS — Z9189 Other specified personal risk factors, not elsewhere classified: Secondary | ICD-10-CM | POA: Diagnosis not present

## 2024-03-19 DIAGNOSIS — R112 Nausea with vomiting, unspecified: Secondary | ICD-10-CM | POA: Diagnosis not present

## 2024-03-23 ENCOUNTER — Other Ambulatory Visit: Payer: Self-pay | Admitting: Internal Medicine

## 2024-03-23 NOTE — Telephone Encounter (Signed)
 Last filled 02-19-24 #120 Last OV 01-09-24 No Future OV [facility]

## 2024-04-01 ENCOUNTER — Ambulatory Visit: Payer: 59 | Admitting: Pulmonary Disease

## 2024-04-12 DIAGNOSIS — G4733 Obstructive sleep apnea (adult) (pediatric): Secondary | ICD-10-CM | POA: Diagnosis not present

## 2024-04-12 DIAGNOSIS — I5032 Chronic diastolic (congestive) heart failure: Secondary | ICD-10-CM | POA: Diagnosis not present

## 2024-04-19 ENCOUNTER — Other Ambulatory Visit: Payer: Self-pay | Admitting: Internal Medicine

## 2024-04-19 NOTE — Telephone Encounter (Signed)
 Last filled 03-23-24 #120 Last OV 01-09-24 No Future OV (Appt with Helayne Lo, NP  05-04-24 to establish care) [facility]

## 2024-05-04 ENCOUNTER — Ambulatory Visit (INDEPENDENT_AMBULATORY_CARE_PROVIDER_SITE_OTHER): Payer: Self-pay | Admitting: Internal Medicine

## 2024-05-04 ENCOUNTER — Encounter: Payer: Self-pay | Admitting: Internal Medicine

## 2024-05-04 VITALS — BP 130/82 | HR 68 | Ht 63.5 in | Wt 284.4 lb

## 2024-05-04 DIAGNOSIS — G4733 Obstructive sleep apnea (adult) (pediatric): Secondary | ICD-10-CM | POA: Diagnosis not present

## 2024-05-04 DIAGNOSIS — M150 Primary generalized (osteo)arthritis: Secondary | ICD-10-CM

## 2024-05-04 DIAGNOSIS — I10 Essential (primary) hypertension: Secondary | ICD-10-CM | POA: Diagnosis not present

## 2024-05-04 DIAGNOSIS — D696 Thrombocytopenia, unspecified: Secondary | ICD-10-CM

## 2024-05-04 DIAGNOSIS — I480 Paroxysmal atrial fibrillation: Secondary | ICD-10-CM | POA: Diagnosis not present

## 2024-05-04 DIAGNOSIS — R739 Hyperglycemia, unspecified: Secondary | ICD-10-CM | POA: Diagnosis not present

## 2024-05-04 DIAGNOSIS — J4489 Other specified chronic obstructive pulmonary disease: Secondary | ICD-10-CM

## 2024-05-04 DIAGNOSIS — F320 Major depressive disorder, single episode, mild: Secondary | ICD-10-CM

## 2024-05-04 DIAGNOSIS — I5032 Chronic diastolic (congestive) heart failure: Secondary | ICD-10-CM | POA: Diagnosis not present

## 2024-05-04 DIAGNOSIS — K219 Gastro-esophageal reflux disease without esophagitis: Secondary | ICD-10-CM

## 2024-05-04 DIAGNOSIS — Z8673 Personal history of transient ischemic attack (TIA), and cerebral infarction without residual deficits: Secondary | ICD-10-CM | POA: Diagnosis not present

## 2024-05-04 DIAGNOSIS — J9611 Chronic respiratory failure with hypoxia: Secondary | ICD-10-CM

## 2024-05-04 DIAGNOSIS — I700 Atherosclerosis of aorta: Secondary | ICD-10-CM | POA: Insufficient documentation

## 2024-05-04 DIAGNOSIS — D751 Secondary polycythemia: Secondary | ICD-10-CM | POA: Insufficient documentation

## 2024-05-04 DIAGNOSIS — N3946 Mixed incontinence: Secondary | ICD-10-CM

## 2024-05-04 DIAGNOSIS — E66813 Obesity, class 3: Secondary | ICD-10-CM

## 2024-05-04 NOTE — Assessment & Plan Note (Signed)
 C-Met and lipid profile today She stopped atorvastatin  due to nightmares but is taking ezetimibe as prescribed Continue apixaban 

## 2024-05-04 NOTE — Assessment & Plan Note (Signed)
 Controlled on metoprolol  and furosemide  Reinforced DASH diet C-Met today

## 2024-05-04 NOTE — Assessment & Plan Note (Signed)
Using oxygen as needed

## 2024-05-04 NOTE — Assessment & Plan Note (Signed)
 Encourage weight loss as this can help reduce joint pain Continue tramadol  as needed

## 2024-05-04 NOTE — Assessment & Plan Note (Signed)
 Try to identify and avoid foods that trigger reflux Encourage weight loss as this can help reduce reflux symptoms Continue pantoprazole

## 2024-05-04 NOTE — Assessment & Plan Note (Signed)
Noncompliant with CPAP Encouraged weight loss as this can help reduce sleep apnea symptoms 

## 2024-05-04 NOTE — Assessment & Plan Note (Signed)
 No improvement with myrbetriq , will discontinue Continue to use pads and change as needed

## 2024-05-04 NOTE — Assessment & Plan Note (Signed)
 Compensated Continue metoprolol  and furosemide  Reinforced DASH diet Encouraged daily weights She will continue to follow with cardiology

## 2024-05-04 NOTE — Patient Instructions (Signed)

## 2024-05-04 NOTE — Assessment & Plan Note (Signed)
 Continue breztri  and levalbuterol  She will continue to follow with pulmonology

## 2024-05-04 NOTE — Assessment & Plan Note (Signed)
 CBC today.

## 2024-05-04 NOTE — Assessment & Plan Note (Signed)
 Continue metoprolol  and apixaban  per cardiology

## 2024-05-04 NOTE — Assessment & Plan Note (Signed)
 Stable on her current dose of sertraline  We will monitor

## 2024-05-04 NOTE — Progress Notes (Signed)
 Subjective:    Patient ID: Autumn Price, female    DOB: Dec 06, 1941, 83 y.o.   MRN: [id number]  HPI  Patient presents to clinic today to establish care and for management of the conditions listed below.  HTN: Her BP today is 130/82.  She is taking metoprolol  and furosemide  as prescribed.  ECG from 10/2023 reviewed.  A-fib: Chronic, managed on metoprolol  and apixaban .  ECG from 10/2023 reviewed.  She follows with cardiology.  CHF: She denies chronic cough but does have shortness of breath and lower extremity edema.  She is taking metoprolol  and furosemide  as prescribed.  Echo from 07/2023 reviewed.  She follows with cardiology.  Asthmatic bronchitis with CHF: She denies chronic cough but does have shortness of breath.  She is using breztri  and levoalbuterol as prescribed. She using oxygen  only as needed. There are no PFTs on file.  She follows with pulmonology.  OSA: She averages 7 hours of sleep per night without the use of her CPAP.  Sleep study from 09/2020 reviewed.  GERD: She is not sure what triggers this. She denies breakthrough on pantoprazole .  There is no upper GI on file.  OA: Mainly in her hands, back and knees.  Managed with tramadol .  She does not follow with orthopedics.  HLD with aortic atherosclerosis, history of TIA: Her last LDL was 100, triglycerides 99, 10/2023.  She denies myalgias on ezetimibe but is no longer taking atorvastatin .  She is taking metoprolol  and eliquis  as well.  She tries to consume a low-fat diet.  She does not follow with neurology.  OAB: She reports mainly.  She is no longer taking myrbetriq  as prescribed.  She does not follow with urology.  Depression: Chronic, managed on sertraline .  She is not currently seeing a therapist.  She denies anxiety, SI/HI.  Polycythemia: Her last H/H was 15.1/48.5, 10/2023.  She does not follow with hematology.  Thrombocytopenia: Her last platelet count was 118, 10/2023.  She does not follow with  hematology.  Review of Systems   Past Medical History:  Diagnosis Date   Depression    Hypertension    Osteoarthritis    Urge incontinence    Venous insufficiency     Current Outpatient Medications  Medication Sig Dispense Refill   acetaminophen  (TYLENOL ) 500 MG tablet Take 500 mg by mouth every 6 (six) hours as needed for mild pain or moderate pain.     atorvastatin  (LIPITOR) 20 MG tablet Take 1 tablet (20 mg total) by mouth daily. 90 tablet 3   Budeson-Glycopyrrol-Formoterol  (BREZTRI  AEROSPHERE) 160-9-4.8 MCG/ACT AERO Inhale 2 puffs into the lungs in the morning and at bedtime. 10.7 g 11   ELIQUIS  5 MG TABS tablet Take 5 mg by mouth 2 (two) times daily.     fluticasone  (FLONASE ) 50 MCG/ACT nasal spray PLACE 1 SPRAY INTO BOTH NOSTRILS DAILY AS NEEDED FOR ALLERGIES OR RHINITIS. 48 mL 1   furosemide  (LASIX ) 40 MG tablet Take 40 mg by mouth daily.     ketoconazole  (NIZORAL ) 2 % cream APPLY 1 APPLICATION TOPICALLY TO GROIN AREA 2 TIMES A DAY AS NEEDED FOR IRRITATION 30 g 3   levalbuterol  (XOPENEX  HFA) 45 MCG/ACT inhaler Inhale 2 puffs into the lungs every 6 (six) hours as needed for wheezing. 15 g 11   meclizine  (ANTIVERT ) 25 MG tablet Take 1 tablet (25 mg total) by mouth 3 (three) times daily as needed for dizziness. 30 tablet 2   metoprolol  succinate (TOPROL  XL) 25 MG 24 hr  tablet Take 1.5 tablets (37.5 mg total) by mouth 2 (two) times daily. 270 tablet 3   mirabegron  ER (MYRBETRIQ ) 50 MG TB24 tablet TAKE 1 TABLET ONCE DAILY 90 tablet 3   montelukast  (SINGULAIR ) 10 MG tablet Take 1 tablet (10 mg total) by mouth at bedtime. 30 tablet 11   pantoprazole  (PROTONIX ) 40 MG tablet Take 1 tablet (40 mg total) by mouth daily. 90 tablet 3   sertraline  (ZOLOFT ) 100 MG tablet Take 1 tablet (100 mg total) by mouth daily. 90 tablet 3   traMADol  (ULTRAM ) 50 MG tablet Take 1-2 tablets (50-100 mg total) by mouth every 12 (twelve) hours as needed. 120 tablet 0   No current facility-administered  medications for this visit.    No Known Allergies  Family History  Problem Relation Age of Onset   Heart disease Father        died age 52- MI   Alcohol abuse Brother    Heart disease Brother    Heart disease Sister    Breast cancer Neg Hx     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    Comment: [employer]  Tobacco Use   Smoking status: Never    Passive exposure: Never   Smokeless tobacco: Never  Vaping Use   Vaping status: Never Used  Substance and Sexual Activity   Alcohol use: No   Drug use: No   Sexual activity: Not Currently  Other Topics Concern   Not on file  Social History Narrative   Has living will   Designated son Atha Lazar and other children to make health care decisions for her   Would accept CPR/resuscitation   No prolonged tube feeds (if cognitively unaware)   Social Drivers of Health   Financial Resource Strain: Low Risk  (08/18/2021)   Overall Financial Resource Strain (CARDIA)    Difficulty of Paying Living Expenses: Not hard at all  Food Insecurity: No Food Insecurity (11/07/2023)   Hunger Vital Sign    Worried About Running Out of Food in the Last Year: Never true    Ran Out of Food in the Last Year: Never true  Transportation Needs: No Transportation Needs (11/07/2023)   PRAPARE - Administrator, Civil Service (Medical): No    Lack of Transportation (Non-Medical): No  Physical Activity: Insufficiently Active (08/18/2021)   Exercise Vital Sign    Days of Exercise per Week: 4 days    Minutes of Exercise per Session: 10 min  Stress: No Stress Concern Present (08/18/2021)   [employer] of Occupational Health - Occupational Stress Questionnaire    Feeling of Stress : Only a little  Social Connections:  Moderately Isolated (08/18/2021)   Social Connection and Isolation Panel [NHANES]    Frequency of Communication with Friends and Family: More than three times a week    Frequency of Social Gatherings with Friends and Family: Never    Attends Religious Services: 1 to 4 times per year    Active Member of [employer] or Organizations: No    Attends Banker Meetings: Never    Marital Status: Divorced  Catering manager Violence: Not At Risk (11/07/2023)   Humiliation, Afraid, Rape, and Kick questionnaire    Fear of  Current or Ex-Partner: No    Emotionally Abused: No    Physically Abused: No    Sexually Abused: No     Constitutional: Pt reports fatigue. Denies fever, malaise, headache or abrupt weight changes.  HEENT: Denies eye pain, eye redness, ear pain, ringing in the ears, wax buildup, runny nose, nasal congestion, bloody nose, or sore throat. Respiratory: Pt reports shortness of breath. Denies difficulty breathing, cough or sputum production.   Cardiovascular: Patient reports swelling in legs.  Denies chest pain, chest tightness, palpitations or swelling in the hands.  Gastrointestinal: Denies abdominal pain, bloating, constipation, diarrhea or blood in the stool.  GU: Patient reports urinary frequency.  Denies urgency, pain with urination, burning sensation, blood in urine, odor or discharge. Musculoskeletal: Patient reports joint pain.  Denies decrease in range of motion, difficulty with gait, muscle pain or joint swelling.  Skin: Denies redness, rashes, lesions or ulcercations.  Neurological: Pt reports tremor. Denies dizziness, difficulty with memory, difficulty with speech or problems with balance and coordination.  Psych: Patient has a history of depression.  Denies anxiety, SI/HI.  No other specific complaints in a complete review of systems (except as listed in HPI above).      Objective:   Physical Exam  BP 130/82 (BP Location: Right Arm, Patient Position: Sitting,  Cuff Size: Large)   Pulse 68   Ht 5' 3.5" (1.613 m)   Wt 284 lb 6.4 oz (129 kg)   SpO2 95%   BMI 49.59 kg/m   Wt Readings from Last 3 Encounters:  01/09/24 278 lb (126.1 kg)  01/01/24 278 lb (126.1 kg)  11/10/23 276 lb (125.2 kg)    General: Appears her stated age, well developed, well nourished in NAD. Skin: Warm, dry and intact.  HEENT: Head: normal shape and size; Eyes: sclera white, no icterus, conjunctiva pink, PERRLA and EOMs intact;  Cardiovascular: Bradycardic with normal rhythm. S1,S2 noted.  No murmur, rubs or gallops noted. No JVD or BLE edema. No carotid bruits noted. Pulmonary/Chest: Normal effort and positive vesicular breath sounds. No respiratory distress. No wheezes, rales or ronchi noted.  Abdomen: Soft and nontender. Normal bowel sounds.  Musculoskeletal: Joint enlargement noted of bilateral knees.  Lipidemia noted of BLE.  She is difficult to get in from a sitting to a standing position.  Gait slightly unsteady without use of device. Neurological: Alert and oriented. Cranial nerves II-XII grossly intact. Coordination normal.  Psychiatric: Mood and affect normal. Behavior is normal. Judgment and thought content normal.    BMET    Component Value Date/Time   NA 137 11/06/2023 1222   NA 142 01/20/2014 1303   K 4.5 11/06/2023 1222   K 4.4 01/20/2014 1303   CL 104 11/06/2023 1222   CL 106 01/20/2014 1303   CO2 22 11/06/2023 1222   CO2 33 (H) 01/20/2014 1303   GLUCOSE 101 (H) 11/06/2023 1222   GLUCOSE 129 (H) 01/20/2014 1303   BUN 25 (H) 11/06/2023 1222   BUN 13 01/20/2014 1303   CREATININE 0.95 11/06/2023 1222   CREATININE 0.72 01/20/2014 1303   CALCIUM  9.6 11/06/2023 1222   CALCIUM  9.6 01/20/2014 1303   GFRNONAA >60 11/06/2023 1222   GFRNONAA >60 01/20/2014 1303   GFRAA >60 10/08/2019 1844   GFRAA >60 01/20/2014 1303    Lipid Panel     Component Value Date/Time   CHOL 162 11/07/2023 0626   TRIG 99 11/07/2023 0626   HDL 42 11/07/2023 0626    CHOLHDL 3.9 11/07/2023 3086  VLDL 20 11/07/2023 0626   LDLCALC 100 (H) 11/07/2023 0626    CBC    Component Value Date/Time   WBC 4.5 11/06/2023 1222   RBC 5.30 (H) 11/06/2023 1222   HGB 15.1 (H) 11/06/2023 1222   HGB 14.3 01/20/2014 1303   HCT 48.5 (H) 11/06/2023 1222   HCT 42.9 01/20/2014 1303   PLT 118 (L) 11/06/2023 1222   PLT 160 01/20/2014 1303   MCV 91.5 11/06/2023 1222   MCV 89 01/20/2014 1303   MCH 28.5 11/06/2023 1222   MCHC 31.1 11/06/2023 1222   RDW 13.2 11/06/2023 1222   RDW 14.3 01/20/2014 1303   LYMPHSABS 1.1 11/06/2023 1222   MONOABS 0.4 11/06/2023 1222   EOSABS 0.3 11/06/2023 1222   BASOSABS 0.1 11/06/2023 1222    Hgb A1C Lab Results  Component Value Date   HGBA1C 5.6 11/06/2023            Assessment & Plan:    RTC in 6 months for your annual exam Helayne Lo, NP

## 2024-05-04 NOTE — Assessment & Plan Note (Signed)
 Encouraged diet and exercise for weight loss ?

## 2024-05-04 NOTE — Assessment & Plan Note (Signed)
 C-Met and lipid profile today Encouraged her to consume a low-fat diet She stopped atorvastatin  due to nightmares but is taking ezetimibe and apixaban  as prescribed

## 2024-05-05 ENCOUNTER — Ambulatory Visit: Payer: Self-pay | Admitting: Internal Medicine

## 2024-05-05 LAB — CBC
HCT: 45.2 % — ABNORMAL HIGH (ref 35.0–45.0)
Hemoglobin: 14.1 g/dL (ref 11.7–15.5)
MCH: 28.4 pg (ref 27.0–33.0)
MCHC: 31.2 g/dL — ABNORMAL LOW (ref 32.0–36.0)
MCV: 91.1 fL (ref 80.0–100.0)
MPV: 13.6 fL — ABNORMAL HIGH (ref 7.5–12.5)
Platelets: 135 10*3/uL — ABNORMAL LOW (ref 140–400)
RBC: 4.96 10*6/uL (ref 3.80–5.10)
RDW: 12.4 % (ref 11.0–15.0)
WBC: 5.6 10*3/uL (ref 3.8–10.8)

## 2024-05-05 LAB — COMPREHENSIVE METABOLIC PANEL WITH GFR
AG Ratio: 1.5 (calc) (ref 1.0–2.5)
ALT: 11 U/L (ref 6–29)
AST: 18 U/L (ref 10–35)
Albumin: 4.3 g/dL (ref 3.6–5.1)
Alkaline phosphatase (APISO): 81 U/L (ref 37–153)
BUN/Creatinine Ratio: 18 (calc) (ref 6–22)
BUN: 20 mg/dL (ref 7–25)
CO2: 22 mmol/L (ref 20–32)
Calcium: 10 mg/dL (ref 8.6–10.4)
Chloride: 107 mmol/L (ref 98–110)
Creat: 1.09 mg/dL — ABNORMAL HIGH (ref 0.60–0.95)
Globulin: 2.8 g/dL (ref 1.9–3.7)
Glucose, Bld: 101 mg/dL (ref 65–139)
Potassium: 4.5 mmol/L (ref 3.5–5.3)
Sodium: 144 mmol/L (ref 135–146)
Total Bilirubin: 0.6 mg/dL (ref 0.2–1.2)
Total Protein: 7.1 g/dL (ref 6.1–8.1)
eGFR: 51 mL/min/{1.73_m2} — ABNORMAL LOW (ref >=60)

## 2024-05-05 LAB — LIPID PANEL
Cholesterol: 184 mg/dL (ref <200)
HDL: 58 mg/dL (ref >=50)
LDL Cholesterol (Calc): 99 mg/dL
Non-HDL Cholesterol (Calc): 126 mg/dL (ref <130)
Total CHOL/HDL Ratio: 3.2 (calc) (ref <5.0)
Triglycerides: 178 mg/dL — ABNORMAL HIGH (ref <150)

## 2024-05-05 LAB — HEMOGLOBIN A1C
Hgb A1c MFr Bld: 5.6 % (ref <5.7)
Mean Plasma Glucose: 114 mg/dL
eAG (mmol/L): 6.3 mmol/L

## 2024-05-12 DIAGNOSIS — I5032 Chronic diastolic (congestive) heart failure: Secondary | ICD-10-CM | POA: Diagnosis not present

## 2024-05-12 DIAGNOSIS — G4733 Obstructive sleep apnea (adult) (pediatric): Secondary | ICD-10-CM | POA: Diagnosis not present

## 2024-05-19 ENCOUNTER — Other Ambulatory Visit: Payer: Self-pay | Admitting: Internal Medicine

## 2024-05-21 ENCOUNTER — Ambulatory Visit (INDEPENDENT_AMBULATORY_CARE_PROVIDER_SITE_OTHER): Admitting: Pulmonary Disease

## 2024-05-21 ENCOUNTER — Encounter: Payer: Self-pay | Admitting: Pulmonary Disease

## 2024-05-21 VITALS — BP 136/68 | HR 85 | Temp 97.7°F | Ht 64.0 in | Wt 278.0 lb

## 2024-05-21 DIAGNOSIS — I480 Paroxysmal atrial fibrillation: Secondary | ICD-10-CM | POA: Diagnosis not present

## 2024-05-21 DIAGNOSIS — R0602 Shortness of breath: Secondary | ICD-10-CM | POA: Diagnosis not present

## 2024-05-21 DIAGNOSIS — G4736 Sleep related hypoventilation in conditions classified elsewhere: Secondary | ICD-10-CM

## 2024-05-21 DIAGNOSIS — Z6841 Body Mass Index (BMI) 40.0 and over, adult: Secondary | ICD-10-CM

## 2024-05-21 DIAGNOSIS — J4489 Other specified chronic obstructive pulmonary disease: Secondary | ICD-10-CM

## 2024-05-21 DIAGNOSIS — E669 Obesity, unspecified: Secondary | ICD-10-CM

## 2024-05-21 NOTE — Patient Instructions (Signed)
 VISIT SUMMARY:  Today, you came in for a follow-up visit to discuss your chronic dyspnea, asthmatic bronchitis, and atrial fibrillation/flutter. We reviewed your current medications and discussed your weight fluctuations and oxygen  therapy.  YOUR PLAN:  -ASTHMATIC BRONCHITIS: Asthmatic bronchitis is a condition where the airways in your lungs become inflamed and produce excess mucus, leading to difficulty breathing. You should continue using your Breztri  inhaler as prescribed.  -CHRONIC DYSPNEA: Chronic dyspnea means you have ongoing difficulty breathing. This is likely related to your asthmatic bronchitis and other health conditions. Your oxygen  equipment will be serviced soon, which should help. We also discussed that managing your weight could improve your breathing, and you will be referred to your primary care doctor for further assistance with weight management.  -ATRIAL FIBRILLATION/FLUTTER: Atrial fibrillation/flutter is an irregular and often rapid heart rate that can increase your risk of strokes and other heart-related complications. Your condition is currently controlled with metoprolol  and Eliquis , and you should continue taking these medications as prescribed.  INSTRUCTIONS:  Please ensure your oxygen  equipment is serviced as scheduled on Monday. Follow up with your primary care doctor for weight management to help with your breathing. Continue taking your medications as prescribed.

## 2024-05-21 NOTE — Progress Notes (Signed)
 Subjective:    Patient ID: Autumn Price, female    DOB: Mar 10, 1941, 83 y.o.   MRN: [id number]  Patient Care Team: Carollynn Cirri, NP as PCP - General (Internal Medicine) Jacqueline Matsu, MD as PCP - Sleep Medicine (Cardiology) Boyce Byes, MD as PCP - Electrophysiology (Cardiology) Marc Senior, MD as Consulting Physician (Pulmonary Disease) Braxton Calico Lanell Pinta, DO as Referring Physician (Cardiology)  Chief Complaint  Patient presents with   Follow-up    SOB with exertion persistent.  Wheezing when first waking in am, then stops once she is able to move around.    BACKGROUND/INTERVAL:Autumn Price is a 83 year old morbidly obese, lifelong never smoker who presents for follow-up visit on the issue of moderate persistent asthma/asthmatic bronchitis.  I last saw her on 01 January 2024. She has been diagnosed with paroxysmal atrial fibrillation and is being followed by cardiology at [facility] currently.  She is on anticoagulation.   HPI Discussed the use of AI scribe software for clinical note transcription with the patient, who gave verbal consent to proceed.  History of Present Illness   Autumn Price is an 83 year old female with asthmatic bronchitis and chronic dyspnea who presents for follow-up.  She experiences chronic dyspnea and asthmatic bronchitis, describing that she 'gives out really easy' and requires assistance when going to the grocery store. She uses a Breztri  inhaler without any issues obtaining it from the pharmacy.  She has a history of atrial fibrillation/flutter and is currently on metoprolol  and Eliquis . Her heart rhythm is currently controlled.  She reports fluctuations in her weight, describing it as 'up and down, up and down.' She has not taken a diuretic in the past few days due to the inconvenience it causes when she needs to go out. She acknowledges that losing weight would be beneficial but states, 'I don't eat that much' and attributes her weight  issues to limited mobility.  She uses oxygen  at night and is awaiting servicing of her equipment, which is scheduled for Monday. She reports no issues with her sleep when using oxygen .      DATA: 10/13/2019 2D echo: LVEF 60 to 65%, essentially normal, no pulmonary hypertension 06/05/2020 overnight oximetry: Shows desaturations to 72% throughout the course of the night with a high oxygen  desaturation index/sleep disturbance (24 events per hour) 06/27/2020 PFTs: FEV1 1.86 L or 93% predicted, FVC 2.28 L or 86% predicted.  FEV1/FVC 82%. No bronchodilator response.  Diffusion capacity not performed despite being ordered.  No obstructive defect.  No restrictive defect 10/09/2020 Home sleep study: Desaturations noted during study as low as 81%.  Average of 91%.  AHI was 15.5, consistent with moderate obstructive sleep apnea 10/17/2020 nuclear stress test: Low risk study, sensitivity reduced to the patient's body habitus, coronary artery calcifications noted on attenuation correction CT. 06/03/2022 chest x-ray PA and lateral: No active cardiopulmonary disease. 02/21/2022 CBC with differential/IgE: CBC normal with no eosinophilia, IgE 8 IU/mL (normal). 02/04/2023 PFTs: FEV1 1.69 L or 90% predicted, FVC 2.09 L or 82% predicted, FEV1/FVC 81%, no significant bronchodilator response, diffusion capacity normal.  Consistent with mild obstructive defect. 07/21/2023 Zio patch report: Episodes of PSVT and atrial flutter. 07/24/2023 echocardiogram: LVEF 55 to 60% normal right-sided structures, mild mitral valve regurgitation, mild aortic sclerosis with no stenosis.  Review of Systems A 10 point review of systems was performed and it is as noted above otherwise negative.   Patient Active Problem List   Diagnosis Date Noted   Aortic  atherosclerosis (HCC) 05/04/2024   Polycythemia 05/04/2024   Chronic asthmatic bronchitis (HCC) 01/11/2024   Paroxysmal atrial fibrillation (HCC) 11/10/2023   History of TIA  (transient ischemic attack) 11/10/2023   Chronic anticoagulation 11/06/2023   MDD (major depressive disorder), single episode, mild (HCC) 06/25/2023   GERD (gastroesophageal reflux disease) 09/03/2022   Sleep apnea 09/03/2022   Thrombocytopenia (HCC) 01/03/2022   Chronic hypoxemic respiratory failure (HCC) 01/02/2022   Chronic diastolic heart failure (HCC) 10/25/2019   Class 3 severe obesity due to excess calories with body mass index (BMI) of 45.0 to 49.9 in adult 05/22/2012   Mixed stress and urge urinary incontinence 03/19/2011   Essential hypertension, benign 08/21/2010   Osteoarthritis, multiple sites 08/21/2010    Social History   Tobacco Use   Smoking status: Never    Passive exposure: Never   Smokeless tobacco: Never  Substance Use Topics   Alcohol use: No    Allergies  Allergen Reactions   Atorvastatin  Other (See Comments)    Intense dreams    Current Meds  Medication Sig   acetaminophen  (TYLENOL ) 500 MG tablet Take 500 mg by mouth every 6 (six) hours as needed for mild pain or moderate pain.   Budeson-Glycopyrrol-Formoterol  (BREZTRI  AEROSPHERE) 160-9-4.8 MCG/ACT AERO Inhale 2 puffs into the lungs in the morning and at bedtime.   ELIQUIS  5 MG TABS tablet Take 5 mg by mouth 2 (two) times daily.   ezetimibe (ZETIA) 10 MG tablet Take 10 mg by mouth daily.   fluticasone  (FLONASE ) 50 MCG/ACT nasal spray PLACE 1 SPRAY INTO BOTH NOSTRILS DAILY AS NEEDED FOR ALLERGIES OR RHINITIS.   furosemide  (LASIX ) 40 MG tablet Take 40 mg by mouth daily.   ketoconazole  (NIZORAL ) 2 % cream APPLY 1 APPLICATION TOPICALLY TO GROIN AREA 2 TIMES A DAY AS NEEDED FOR IRRITATION   levalbuterol  (XOPENEX  HFA) 45 MCG/ACT inhaler Inhale 2 puffs into the lungs every 6 (six) hours as needed for wheezing.   metoprolol  succinate (TOPROL  XL) 25 MG 24 hr tablet Take 1.5 tablets (37.5 mg total) by mouth 2 (two) times daily.   montelukast  (SINGULAIR ) 10 MG tablet Take 1 tablet (10 mg total) by mouth at  bedtime.   MYRBETRIQ  50 MG TB24 tablet Take 50 mg by mouth daily.   pantoprazole  (PROTONIX ) 40 MG tablet Take 1 tablet (40 mg total) by mouth daily.   sertraline  (ZOLOFT ) 100 MG tablet Take 1 tablet (100 mg total) by mouth daily.   traMADol  (ULTRAM ) 50 MG tablet Take 1-2 tablets (50-100 mg total) by mouth every 12 (twelve) hours as needed.    Immunization History  Administered Date(s) Administered   Fluad Quad(high Dose 65+) 10/11/2020   Influenza Inj Mdck Quad Pf 10/30/2018, 09/25/2019   Influenza, Seasonal, Injecte, Preservative Fre 09/30/2016   Influenza,inj,Quad PF,6+ Mos 09/12/2017   Influenza-Unspecified 09/12/2021, 09/19/2022, 10/09/2023   Pneumococcal Conjugate-13 06/10/2014   Pneumococcal Polysaccharide-23 01/16/2009, 08/12/2017   Td 08/21/2010   Tdap 09/19/2022   Zoster Recombinant(Shingrix ) 09/30/2019, 01/11/2020   Zoster, Live 09/18/2016        Objective:     BP 136/68 (BP Location: Right Arm, Patient Position: Sitting, Cuff Size: Large)   Pulse 85   Temp 97.7 F (36.5 C) (Oral)   Ht 5' 4 (1.626 m)   Wt 278 lb (126.1 kg)   SpO2 96%   BMI 47.72 kg/m   SpO2: 96 %  GENERAL: Morbidly obese woman, no acute distress, presents in transport chair, no conversational dyspnea.  HEAD: Normocephalic, atraumatic.  EYES: Pupils equal, round, reactive to light.  No scleral icterus.  MOUTH: Dentition intact, oral mucosa moist. NECK: Supple. No thyromegaly. Trachea midline. No JVD.  No adenopathy. PULMONARY: Good air entry bilaterally.  No adventitious sounds. CARDIOVASCULAR: S1 and S2. Regular rate and rhythm.  No rubs, murmurs or gallops heard. ABDOMEN: Obese otherwise benign. MUSCULOSKELETAL: No joint deformity, no clubbing, no edema.  NEUROLOGIC: No focal deficit, no gait disturbance, speech is fluent. SKIN: Intact,warm,dry.  Limited exam shows no rashes PSYCH: Mood appropriate, behavior normal.   Assessment & Plan:     ICD-10-CM   1. Chronic asthmatic  bronchitis (HCC)  J44.89     2. Nocturnal hypoxemia due to obesity  E66.9    G47.36     3. Shortness of breath  R06.02     4. Paroxysmal atrial fibrillation (HCC)  I48.0     5. Morbid obesity (HCC)  E66.01      Discussion:    Asthmatic bronchitis Asthmatic bronchitis is managed with Breztri  inhaler without issues in obtaining or using it. - Continue Breztri  inhaler.  Chronic dyspnea Chronic dyspnea persists, likely related to asthmatic bronchitis and comorbidities. Oxygen  therapy is in use, with equipment scheduled for servicing. Weight management is discussed as a potential factor in improving dyspnea. She reports difficulty with weight loss despite limited intake and mobility. - Service oxygen  equipment. - Defer to primary care for weight management interventions.  Atrial fibrillation/flutter Atrial fibrillation/flutter is controlled with metoprolol  and Eliquis . - Continue metoprolol . - Continue Eliquis .      Advised if symptoms do not improve or worsen, to please contact office for sooner follow up or seek emergency care.    I spent 30 minutes of dedicated to the care of this patient on the date of this encounter to include pre-visit review of records, face-to-face time with the patient discussing conditions above, post visit ordering of testing, clinical documentation with the electronic health record, making appropriate referrals as documented, and communicating necessary findings to members of the patients care team.     C. Chloe Counter, MD Advanced Bronchoscopy PCCM [facility]    *This note was generated using voice recognition software/Dragon and/or AI transcription program.  Despite best efforts to proofread, errors can occur which can change the meaning. Any transcriptional errors that result from this process are unintentional and may not be fully corrected at the time of dictation.

## 2024-05-28 ENCOUNTER — Encounter: Payer: Self-pay | Admitting: Pulmonary Disease

## 2024-06-12 DIAGNOSIS — G4733 Obstructive sleep apnea (adult) (pediatric): Secondary | ICD-10-CM | POA: Diagnosis not present

## 2024-06-12 DIAGNOSIS — J40 Bronchitis, not specified as acute or chronic: Secondary | ICD-10-CM | POA: Diagnosis not present

## 2024-06-12 DIAGNOSIS — I5032 Chronic diastolic (congestive) heart failure: Secondary | ICD-10-CM | POA: Diagnosis not present

## 2024-06-12 DIAGNOSIS — J9611 Chronic respiratory failure with hypoxia: Secondary | ICD-10-CM | POA: Diagnosis not present

## 2024-06-22 ENCOUNTER — Other Ambulatory Visit: Payer: Self-pay | Admitting: Internal Medicine

## 2024-06-23 ENCOUNTER — Telehealth: Payer: Self-pay | Admitting: Internal Medicine

## 2024-06-23 NOTE — Telephone Encounter (Unsigned)
 Copied from CRM [id number]. Topic: Clinical - Medication Refill >> Jun 23, 2024  3:56 PM Sasha H wrote: Medication:  traMADol  (ULTRAM ) 50 MG tablet  Has the patient contacted their pharmacy? Yes (Agent: If no, request that the patient contact the pharmacy for the refill. If patient does not wish to contact the pharmacy document the reason why and proceed with request.) (Agent: If yes, when and what did the pharmacy advise?)  This is the patient's preferred pharmacy:  [facility] - [place], [place] - [address] [place] [address] Phone: [phone] Fax: [phone]    Is this the correct pharmacy for this prescription? Yes If no, delete pharmacy and type the correct one.   Has the prescription been filled recently? Yes  Is the patient out of the medication? Yes  Has the patient been seen for an appointment in the last year OR does the patient have an upcoming appointment? Yes  Can we respond through MyChart? Yes  Agent: Please be advised that Rx refills may take up to 3 business days. We ask that you follow-up with your pharmacy.

## 2024-06-24 NOTE — Telephone Encounter (Signed)
 Requested medication (s) are due for refill today: {yes  Requested medication (s) are on the active medication list: yes  Last refill:  05/20/24 #120  Future visit scheduled: yes  Notes to clinic:  med not delegated to NT to RF   Requested Prescriptions  Pending Prescriptions Disp Refills   traMADol  (ULTRAM ) 50 MG tablet [Pharmacy Med Name: TRAMADOL  HCL 50 MG TABLET] 120 tablet 0    Sig: Take 1-2 tablets (50-100 mg total) by mouth every 12 (twelve) hours as needed.     There is no refill protocol information for this order

## 2024-06-24 NOTE — Telephone Encounter (Signed)
 Patient called to f/u on the script for traMADol  (ULTRAM ) 50 MG tablet. Advised patient of 1-3 business days for refills

## 2024-06-25 NOTE — Telephone Encounter (Signed)
 Requested medication (s) are due for refill today:   Provider to review  Looks like it was sent 06/24/2024    Requested medication (s) are on the active medication list:   Yes  Future visit scheduled:   No.   Seen a month ago   Last ordered: 06/24/2024 #120, 0 refills  Looks like this is a duplicate.   Non delegated refill    Requested Prescriptions  Pending Prescriptions Disp Refills   traMADol  (ULTRAM ) 50 MG tablet 120 tablet 0    Sig: Take 1-2 tablets (50-100 mg total) by mouth every 12 (twelve) hours as needed.     Not Delegated - Analgesics:  Opioid Agonists Failed - 06/25/2024 12:58 PM      Failed - This refill cannot be delegated      Failed - Urine Drug Screen completed in last 360 days      Passed - Valid encounter within last 3 months    Recent Outpatient Visits           1 month ago History of TIA (transient ischemic attack)   [facility] [place], Angeline ORN, NP

## 2024-07-12 DIAGNOSIS — G4733 Obstructive sleep apnea (adult) (pediatric): Secondary | ICD-10-CM | POA: Diagnosis not present

## 2024-07-12 DIAGNOSIS — I5032 Chronic diastolic (congestive) heart failure: Secondary | ICD-10-CM | POA: Diagnosis not present

## 2024-07-22 ENCOUNTER — Other Ambulatory Visit: Payer: Self-pay | Admitting: Family Medicine

## 2024-07-26 ENCOUNTER — Other Ambulatory Visit: Payer: Self-pay | Admitting: Internal Medicine

## 2024-07-26 ENCOUNTER — Ambulatory Visit: Payer: Self-pay

## 2024-07-26 NOTE — Telephone Encounter (Signed)
  FYI Only or Action Required?: FYI only for provider.  Patient was last seen in primary care on 05/04/2024 by Autumn Price ORN, NP.  Called Nurse Triage reporting Fall.  Symptoms began several days ago.  Interventions attempted: Prescription medications: tramadol .  Symptoms are: stable.  Triage Disposition: Home Care  Patient/caregiver understands and will follow disposition?: Yes                             Copied from CRM #[id number]. Topic: Clinical - Red Word Triage >> Jul 26, 2024 10:45 AM Autumn Price wrote: Red Word that prompted transfer to Nurse Triage: patient had a fall on 8/7. Believes to have a urinary infection. Reason for Disposition  Small bruise is present  Answer Assessment - Initial Assessment Questions 1. MECHANISM: How did the fall happen?     Tripped on air vent  2. DOMESTIC VIOLENCE AND ELDER ABUSE SCREENING: Did you fall because someone pushed you or tried to hurt you? If Yes, ask: Are you safe now?     No, states she tripped 3. ONSET: When did the fall happen? (e.g., minutes, hours, or days ago)     Thursday evening 4. LOCATION: What part of the body hit the ground? (e.g., back, buttocks, head, hips, knees, hands, head, stomach)     Whole body went down, denies hitting head  5. INJURY: Did you hurt (injure) yourself when you fell? If Yes, ask: What did you injure? Tell me more about this? (e.g., body area; type of injury; pain severity)     Knee pain  6. PAIN: Is there any pain? If Yes, ask: How bad is the pain? (e.g., Scale 0-10; or none, mild,      Left knee pain, rates pain a 4  7. SIZE: For cuts, bruises, or swelling, ask: How large is it? (e.g., inches or centimeters)      Bruises on arms and knees 9. OTHER SYMPTOMS: Do you have any other symptoms? (e.g., dizziness, fever, weakness; new-onset or worsening).      States she had to wait for grandson to help her get up, denies painful urination, denies  difficulty urinating, denies lower back pain worse than baseline, denies fever, denies hematuria, denies confusion or brain fog, denies abdominal pain, denies numbness, denies weakness 10. CAUSE: What do you think caused the fall (or falling)? (e.g., dizzy spell, tripped)     Tripped   Patient stated she is on Eliquis , but denied other recent falls. Patient also denied hitting head. Patient is calling today because grandson is concerned that patient's fall was related to UTI. Patient stated grandson had a previous bad experience with another grandparent and a UTI. Patient denied symptoms of UTI (see above). Patient was very adamant that she knows her body and knows when it is appropriate to come into the office. Advised home care at this time and instructed patient to call back if new symptoms arise or if bruises from fall fail to improve. Patient verbalized understanding.  Protocols used: Falls and [facility]

## 2024-07-26 NOTE — Telephone Encounter (Signed)
 Patient requested medication refill during triage call. See recent triage encounter.

## 2024-07-26 NOTE — Telephone Encounter (Signed)
 Requested medications are due for refill today.  yes  Requested medications are on the active medications list.  yes  Last refill. 06/24/2024 #120 0 rf  Future visit scheduled.   yes  Notes to clinic.  Refill not delegated.    Requested Prescriptions  Pending Prescriptions Disp Refills   traMADol  (ULTRAM ) 50 MG tablet [Pharmacy Med Name: TRAMADOL  HCL 50 MG TABLET] 120 tablet 0    Sig: Take 1-2 tablets (50-100 mg total) by mouth every 12 (twelve) hours as needed.     Not Delegated - Analgesics:  Opioid Agonists Failed - 07/26/2024  5:31 PM      Failed - This refill cannot be delegated      Failed - Urine Drug Screen completed in last 360 days      Passed - Valid encounter within last 3 months    Recent Outpatient Visits           2 months ago History of TIA (transient ischemic attack)   [facility], Angeline ORN, NP

## 2024-07-29 NOTE — Telephone Encounter (Signed)
 Requested medications are due for refill today.  no  Requested medications are on the active medications list.  yes  Last refill. 07/27/2024  Future visit scheduled.   yes  Notes to clinic.  Refusal not delegated.    Requested Prescriptions  Pending Prescriptions Disp Refills   traMADol  (ULTRAM ) 50 MG tablet 120 tablet 0    Sig: Take 1-2 tablets (50-100 mg total) by mouth every 12 (twelve) hours as needed.     Not Delegated - Analgesics:  Opioid Agonists Failed - 07/29/2024 11:48 AM      Failed - This refill cannot be delegated      Failed - Urine Drug Screen completed in last 360 days      Passed - Valid encounter within last 3 months    Recent Outpatient Visits           2 months ago History of TIA (transient ischemic attack)   [facility] [place], Angeline ORN, NP

## 2024-08-02 ENCOUNTER — Ambulatory Visit: Payer: Self-pay

## 2024-08-02 NOTE — Telephone Encounter (Signed)
Will discuss at upcoming appt tomorrow °

## 2024-08-02 NOTE — Telephone Encounter (Signed)
 FYI Only or Action Required?: FYI only for provider.  Patient was last seen in primary care on 05/04/2024 by Antonette Angeline ORN, NP.  Called Nurse Triage reporting Fall.  Symptoms began a week ago.  Interventions attempted: OTC medications: tylenol .  Symptoms are: gradually worsening.  Triage Disposition: See PCP When Office is Open (Within 3 Days)  Patient/caregiver understands and will follow disposition?: Yes    Copied from CRM #[id number]. Topic: Clinical - Red Word Triage >> Aug 02, 2024 10:25 AM Charlet HERO wrote: Red Word that prompted transfer to Nurse Triage: Patient is calling about a fall she had last Thursday and now her left leg is hurting and bruised and it is hard for her to walk and she would like to get xray. Reason for Disposition  MILD weakness (e.g., does not interfere with ability to work, go to school, normal activities)  (Exception: Mild weakness is a chronic symptom.)  Answer Assessment - Initial Assessment Questions 1. MECHANISM: How did the fall happen?     Tripped and fall 2. DOMESTIC VIOLENCE AND ELDER ABUSE SCREENING: Did you fall because someone pushed you or tried to hurt you? If Yes, ask: Are you safe now?     no 3. ONSET: When did the fall happen? (e.g., minutes, hours, or days ago)     Last Thursday 4. LOCATION: What part of the body hit the ground? (e.g., back, buttocks, head, hips, knees, hands, head, stomach)     Left knee  5. INJURY: Did you hurt (injure) yourself when you fell? If Yes, ask: What did you injure? Tell me more about this? (e.g., body area; type of injury; pain severity)     Left knee 6. PAIN: Is there any pain? If Yes, ask: How bad is the pain? (e.g., Scale 0-10; or none, mild,      2/10 but gets up to 10/10 7. SIZE: For cuts, bruises, or swelling, ask: How large is it? (e.g., inches or centimeters)      na 8. PREGNANCY: Is there any chance you are pregnant? When was your last menstrual period?     na 9.  OTHER SYMPTOMS: Do you have any other symptoms? (e.g., dizziness, fever, weakness; new-onset or worsening).      no 10. CAUSE: What do you think caused the fall (or falling)? (e.g., dizzy spell, tripped)       no  Protocols used: Falls and [facility]

## 2024-08-03 ENCOUNTER — Ambulatory Visit
Admission: RE | Admit: 2024-08-03 | Discharge: 2024-08-03 | Disposition: A | Discharge status: Home / Self Care | Source: Ambulatory Visit | Attending: Internal Medicine | Admitting: Internal Medicine

## 2024-08-03 ENCOUNTER — Ambulatory Visit (INDEPENDENT_AMBULATORY_CARE_PROVIDER_SITE_OTHER): Admitting: Internal Medicine

## 2024-08-03 ENCOUNTER — Ambulatory Visit: Payer: Self-pay | Admitting: Internal Medicine

## 2024-08-03 ENCOUNTER — Encounter: Payer: Self-pay | Admitting: Internal Medicine

## 2024-08-03 VITALS — BP 138/74 | HR 67 | Ht 64.0 in | Wt 286.1 lb

## 2024-08-03 DIAGNOSIS — Z96652 Presence of left artificial knee joint: Secondary | ICD-10-CM

## 2024-08-03 DIAGNOSIS — M25562 Pain in left knee: Secondary | ICD-10-CM

## 2024-08-03 DIAGNOSIS — M25462 Effusion, left knee: Secondary | ICD-10-CM | POA: Diagnosis not present

## 2024-08-03 DIAGNOSIS — W010XXA Fall on same level from slipping, tripping and stumbling without subsequent striking against object, initial encounter: Secondary | ICD-10-CM | POA: Insufficient documentation

## 2024-08-03 DIAGNOSIS — Z471 Aftercare following joint replacement surgery: Secondary | ICD-10-CM | POA: Diagnosis not present

## 2024-08-03 NOTE — Patient Instructions (Signed)
 RICE Therapy for Routine Care of Injuries Many injuries can be cared for with rest, ice, compression, and elevation. This is also called RICE therapy. RICE therapy includes: Resting the injured body part. Putting ice on the injury. Putting pressure on the injury. This is also called compression. Raising the injured part. This is also called elevation. RICE therapy can help reduce pain and swelling. Supplies needed: Ice. Plastic bag. Towel. Elastic bandage. Pillow or pillows to raise the injured body part. How to care for your injury with RICE therapy Rest Try to rest the injured part of your body. You can go back to your normal activities when your health care provider says it's okay to do them and when you can do them without pain. Ask what things are safe for you to do. Some injuries heal better with early movement instead of resting. If you rest the injury too much, it may not heal as well. Ask your provider if you should do exercises to help your injury get better. Ice Putting ice on your injury can help to lessen swelling and pain. Do not apply ice directly to your skin. Use ice on as many days as told by your provider. If told, put ice on the area. Put ice in a plastic bag. Place a towel between your skin and the bag. Leave the ice on for 20 minutes, 2-3 times a day. If your skin turns bright red, take off the ice right away to prevent skin damage. The risk of damage is higher if you can't feel pain, heat, or cold.  Compression Put pressure, also called compression, on your injured area. This can be done with an elastic bandage. If this type of bandage has been put on your injury: Follow instructions on the package the bandage came in about how to use it. Do not wrap the bandage too tightly. Wrap the bandage more loosely if part of your body beyond the bandage looks blue, or is swollen, cold, painful, or loses feeling. Take off the bandage and put it on again every 3-4 hours or  as told by your provider. Call your provider if the bandage seems to make your injury worse.  Elevation Raise the injured area above the level of your heart while you're sitting or lying down. Use a pillow to support your injured area as needed. Follow these instructions at home: If your symptoms get worse or last a long time, make a follow-up appointment with your provider. You may need to have imaging tests, such as X-rays or an MRI. If you have imaging tests, ask how to get your results when they are ready. Contact a health care provider if: You keep having pain and swelling. Your symptoms get worse. Get help right away if: You have sudden, very bad pain at your injury or lower than your injury. You have tingling or numbness at your injury or lower than your injury, and it does not go away when you take the bandage off. This information is not intended to replace advice given to you by your health care provider. Make sure you discuss any questions you have with your health care provider. Document Revised: 09/04/2023 Document Reviewed: 02/17/2023 Elsevier Patient Education  2024 [employer].

## 2024-08-03 NOTE — Progress Notes (Signed)
 Subjective:    Patient ID: Autumn Price, female    DOB: October 21, 1941, 83 y.o.   MRN: [id number]  HPI  Discussed the use of AI scribe software for clinical note transcription with the patient, who gave verbal consent to proceed.  Autumn Price is an 83 year old female who presents with left knee pain after a fall. She is accompanied by her family.  Approximately ten days ago, she experienced a fall while attempting to turn around from the kitchen sink, landing on her left knee and right arm. She did not hit her head during the fall. Her left knee, which underwent a knee replacement five to six years ago, is her primary concern. She was unable to get up after the fall and required assistance from her family.  Following the fall, she initially had a couple of good days, but then the pain in her knee worsened. She is currently using a walker to aid in mobility, which she was not using prior to the fall. She has been taking Tylenol  for pain management, specifically two arthritis strength tablets of 650 mg each. She iced the knee on the first night after the fall but has not continued icing since then. One night, the pain was severe enough to keep her up half the night, but the following night it did not bother her.  She is concerned about the integrity of her knee replacement hardware following the fall.       Review of Systems   Past Medical History:  Diagnosis Date   Asthma    Depression    GERD (gastroesophageal reflux disease)    Hypertension    Osteoarthritis    Urge incontinence    Venous insufficiency     Current Outpatient Medications  Medication Sig Dispense Refill   acetaminophen  (TYLENOL ) 500 MG tablet Take 500 mg by mouth every 6 (six) hours as needed for mild pain or moderate pain.     Budeson-Glycopyrrol-Formoterol  (BREZTRI  AEROSPHERE) 160-9-4.8 MCG/ACT AERO Inhale 2 puffs into the lungs in the morning and at bedtime. 10.7 g 11   ELIQUIS  5 MG TABS tablet Take 5 mg by  mouth 2 (two) times daily.     ezetimibe (ZETIA) 10 MG tablet Take 10 mg by mouth daily.     fluticasone  (FLONASE ) 50 MCG/ACT nasal spray PLACE 1 SPRAY INTO BOTH NOSTRILS DAILY AS NEEDED FOR ALLERGIES OR RHINITIS. 48 mL 1   furosemide  (LASIX ) 40 MG tablet Take 40 mg by mouth daily.     ketoconazole  (NIZORAL ) 2 % cream APPLY 1 APPLICATION TOPICALLY TO GROIN AREA 2 TIMES A DAY AS NEEDED FOR IRRITATION 30 g 3   levalbuterol  (XOPENEX  HFA) 45 MCG/ACT inhaler Inhale 2 puffs into the lungs every 6 (six) hours as needed for wheezing. 15 g 11   metoprolol  succinate (TOPROL  XL) 25 MG 24 hr tablet Take 1.5 tablets (37.5 mg total) by mouth 2 (two) times daily. 270 tablet 3   montelukast  (SINGULAIR ) 10 MG tablet Take 1 tablet (10 mg total) by mouth at bedtime. 30 tablet 11   MYRBETRIQ  50 MG TB24 tablet Take 50 mg by mouth daily.     pantoprazole  (PROTONIX ) 40 MG tablet Take 1 tablet (40 mg total) by mouth daily. 90 tablet 3   sertraline  (ZOLOFT ) 100 MG tablet Take 1 tablet (100 mg total) by mouth daily. 90 tablet 3   traMADol  (ULTRAM ) 50 MG tablet Take 1-2 tablets (50-100 mg total) by mouth every 12 (twelve) hours  as needed. 120 tablet 0   No current facility-administered medications for this visit.    Allergies  Allergen Reactions   Atorvastatin  Other (See Comments)    Intense dreams    Family History  Problem Relation Age of Onset   Heart disease Father        died age 82- MI   Alcohol abuse Brother    Heart disease Brother    Heart disease Sister    Breast cancer Neg Hx     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    Comment: [employer]  Tobacco Use   Smoking status: Never    Passive exposure: Never   Smokeless tobacco: Never  Vaping Use   Vaping status: Never  Used  Substance and Sexual Activity   Alcohol use: No   Drug use: No   Sexual activity: Not Currently  Other Topics Concern   Not on file  Social History Narrative   Has living will   Designated son Christopher Louder and other children to make health care decisions for her   Would accept CPR/resuscitation   No prolonged tube feeds (if cognitively unaware)   Social Drivers of Health   Financial Resource Strain: Low Risk  (08/18/2021)   Overall Financial Resource Strain (CARDIA)    Difficulty of Paying Living Expenses: Not hard at all  Food Insecurity: No Food Insecurity (11/07/2023)   Hunger Vital Sign    Worried About Running Out of Food in the Last Year: Never true    Ran Out of Food in the Last Year: Never true  Transportation Needs: No Transportation Needs (11/07/2023)   PRAPARE - Administrator, Civil Service (Medical): No    Lack of Transportation (Non-Medical): No  Physical Activity: Insufficiently Active (08/18/2021)   Exercise Vital Sign    Days of Exercise per Week: 4 days    Minutes of Exercise per Session: 10 min  Stress: No Stress Concern Present (08/18/2021)   [employer] of Occupational Health - Occupational Stress Questionnaire    Feeling of Stress : Only a little  Social Connections: Moderately Isolated (08/18/2021)   Social Connection and Isolation Panel    Frequency of Communication with Friends and Family: More than three times a week    Frequency of Social Gatherings with Friends and Family: Never    Attends Religious Services: 1 to 4 times per year    Active Member of [employer] or Organizations: No    Attends Banker Meetings: Never    Marital Status: Divorced  Catering manager Violence: Not At Risk (11/07/2023)   Humiliation, Afraid, Rape, and Kick questionnaire    Fear of Current or Ex-Partner: No    Emotionally Abused: No    Physically Abused: No    Sexually Abused: No     Constitutional: Pt reports fatigue. Denies fever,  malaise, headache or abrupt weight changes.  Respiratory: Pt reports shortness of breath. Denies difficulty breathing, cough or sputum production.   Cardiovascular: Patient reports swelling in legs.  Denies chest pain, chest tightness, palpitations or swelling in the hands.  Musculoskeletal: Patient reports left knee pain, difficulty with gait.  Denies decrease in range of motion, muscle pain or joint swelling.  Skin: Patient  reports bruising to right upper arm, left knee.  Denies redness, rashes, lesions or ulcercations.    No other specific complaints in a complete review of systems (except as listed in HPI above).      Objective:   Physical Exam BP 138/74   Pulse 67   Ht 5' 4 (1.626 m)   Wt 286 lb 2 oz (129.8 kg)   SpO2 91%   BMI 49.11 kg/m   Wt Readings from Last 3 Encounters:  05/21/24 278 lb (126.1 kg)  05/04/24 284 lb 6.4 oz (129 kg)  01/09/24 278 lb (126.1 kg)    General: Appears her stated age, obese, in NAD. Skin: Warm, dry and intact.  Bruising noted of the right upper extremity.  Bruising noted over the left patella. Cardiovascular: Normal rate and rhythm. S1,S2 noted.  No murmur, rubs or gallops noted.  Pulmonary/Chest: Normal effort and positive vesicular breath sounds. No respiratory distress. No wheezes, rales or ronchi noted.  Musculoskeletal: Normal flexion and extension of left knee.  Pain with palpation over the inferior patella tendon, medial pes bursa and medial joint line.  She has difficult to get in from a sitting to a standing position.  Limping gait, holding onto her grandson for assistance. Neurological: Alert and oriented.   BMET    Component Value Date/Time   NA 144 05/04/2024 1425   NA 142 01/20/2014 1303   K 4.5 05/04/2024 1425   K 4.4 01/20/2014 1303   CL 107 05/04/2024 1425   CL 106 01/20/2014 1303   CO2 22 05/04/2024 1425   CO2 33 (H) 01/20/2014 1303   GLUCOSE 101 05/04/2024 1425   GLUCOSE 129 (H) 01/20/2014 1303   BUN 20 05/04/2024  1425   BUN 13 01/20/2014 1303   CREATININE 1.09 (H) 05/04/2024 1425   CALCIUM  10.0 05/04/2024 1425   CALCIUM  9.6 01/20/2014 1303   GFRNONAA >60 11/06/2023 1222   GFRNONAA >60 01/20/2014 1303   GFRAA >60 10/08/2019 1844   GFRAA >60 01/20/2014 1303    Lipid Panel     Component Value Date/Time   CHOL 184 05/04/2024 1425   TRIG 178 (H) 05/04/2024 1425   HDL 58 05/04/2024 1425   CHOLHDL 3.2 05/04/2024 1425   VLDL 20 11/07/2023 0626   LDLCALC 99 05/04/2024 1425    CBC    Component Value Date/Time   WBC 5.6 05/04/2024 1425   RBC 4.96 05/04/2024 1425   HGB 14.1 05/04/2024 1425   HGB 14.3 01/20/2014 1303   HCT 45.2 (H) 05/04/2024 1425   HCT 42.9 01/20/2014 1303   PLT 135 (L) 05/04/2024 1425   PLT 160 01/20/2014 1303   MCV 91.1 05/04/2024 1425   MCV 89 01/20/2014 1303   MCH 28.4 05/04/2024 1425   MCHC 31.2 (L) 05/04/2024 1425   RDW 12.4 05/04/2024 1425   RDW 14.3 01/20/2014 1303   LYMPHSABS 1.1 11/06/2023 1222   MONOABS 0.4 11/06/2023 1222   EOSABS 0.3 11/06/2023 1222   BASOSABS 0.1 11/06/2023 1222    Hgb A1C Lab Results  Component Value Date   HGBA1C 5.6 05/04/2024            Assessment & Plan:  Assessment and Plan    Left knee pain after fall with prior left knee replacement Pain and difficulty walking post-fall with concern for hardware damage. - Order X-ray of the left knee to assess hardware integrity. - Advise acetaminophen  up to 1 gram every 8 hours for pain, max five 650 mg tablets  per day. -Encouraged rest, ice and elevation as needed for pain and swelling - Discuss potential surgical intervention if hardware is damaged.     RTC in 3 months for your annual exam Angeline Laura, NP

## 2024-08-12 DIAGNOSIS — G4733 Obstructive sleep apnea (adult) (pediatric): Secondary | ICD-10-CM | POA: Diagnosis not present

## 2024-08-12 DIAGNOSIS — I5032 Chronic diastolic (congestive) heart failure: Secondary | ICD-10-CM | POA: Diagnosis not present

## 2024-08-24 ENCOUNTER — Other Ambulatory Visit: Payer: Self-pay | Admitting: Internal Medicine

## 2024-08-25 NOTE — Telephone Encounter (Signed)
 Requested medication (s) are due for refill today:   Provider to review  Requested medication (s) are on the active medication list:   Yes  Future visit scheduled:   Yes 12/1   Last ordered: 07/27/2024 #120, 0 refills  Non delegated refill    Requested Prescriptions  Pending Prescriptions Disp Refills   traMADol  (ULTRAM ) 50 MG tablet [Pharmacy Med Name: TRAMADOL  HCL 50 MG TABLET] 120 tablet 0    Sig: Take 1-2 tablets (50-100 mg total) by mouth every 12 (twelve) hours as needed.     Not Delegated - Analgesics:  Opioid Agonists Failed - 08/25/2024  3:11 PM      Failed - This refill cannot be delegated      Failed - Urine Drug Screen completed in last 360 days      Failed - Valid encounter within last 3 months    Recent Outpatient Visits           3 weeks ago Acute pain of left knee   [place] [facility] [place], Angeline ORN, NP   3 months ago History of TIA (transient ischemic attack)   [place] [facility] [place], Angeline ORN, NP

## 2024-09-02 ENCOUNTER — Emergency Department

## 2024-09-02 ENCOUNTER — Emergency Department: Admission: EM | Admit: 2024-09-02 | Discharge: 2024-09-02 | Disposition: A | Discharge status: Home / Self Care

## 2024-09-02 ENCOUNTER — Other Ambulatory Visit: Payer: Self-pay

## 2024-09-02 DIAGNOSIS — W010XXA Fall on same level from slipping, tripping and stumbling without subsequent striking against object, initial encounter: Secondary | ICD-10-CM | POA: Diagnosis not present

## 2024-09-02 DIAGNOSIS — S43014A Anterior dislocation of right humerus, initial encounter: Secondary | ICD-10-CM | POA: Diagnosis not present

## 2024-09-02 DIAGNOSIS — S0990XA Unspecified injury of head, initial encounter: Secondary | ICD-10-CM | POA: Diagnosis not present

## 2024-09-02 DIAGNOSIS — I4891 Unspecified atrial fibrillation: Secondary | ICD-10-CM | POA: Diagnosis not present

## 2024-09-02 DIAGNOSIS — S4291XA Fracture of right shoulder girdle, part unspecified, initial encounter for closed fracture: Secondary | ICD-10-CM

## 2024-09-02 DIAGNOSIS — W19XXXA Unspecified fall, initial encounter: Secondary | ICD-10-CM | POA: Diagnosis not present

## 2024-09-02 DIAGNOSIS — R918 Other nonspecific abnormal finding of lung field: Secondary | ICD-10-CM | POA: Diagnosis not present

## 2024-09-02 DIAGNOSIS — S43004A Unspecified dislocation of right shoulder joint, initial encounter: Secondary | ICD-10-CM | POA: Diagnosis not present

## 2024-09-02 DIAGNOSIS — I6529 Occlusion and stenosis of unspecified carotid artery: Secondary | ICD-10-CM | POA: Diagnosis not present

## 2024-09-02 DIAGNOSIS — Z043 Encounter for examination and observation following other accident: Secondary | ICD-10-CM | POA: Diagnosis not present

## 2024-09-02 DIAGNOSIS — I700 Atherosclerosis of aorta: Secondary | ICD-10-CM | POA: Diagnosis not present

## 2024-09-02 DIAGNOSIS — Y92009 Unspecified place in unspecified non-institutional (private) residence as the place of occurrence of the external cause: Secondary | ICD-10-CM | POA: Diagnosis not present

## 2024-09-02 DIAGNOSIS — Z7901 Long term (current) use of anticoagulants: Secondary | ICD-10-CM | POA: Insufficient documentation

## 2024-09-02 DIAGNOSIS — S42201A Unspecified fracture of upper end of right humerus, initial encounter for closed fracture: Secondary | ICD-10-CM | POA: Diagnosis not present

## 2024-09-02 DIAGNOSIS — I509 Heart failure, unspecified: Secondary | ICD-10-CM | POA: Diagnosis not present

## 2024-09-02 DIAGNOSIS — M85811 Other specified disorders of bone density and structure, right shoulder: Secondary | ICD-10-CM | POA: Diagnosis not present

## 2024-09-02 DIAGNOSIS — S4991XA Unspecified injury of right shoulder and upper arm, initial encounter: Secondary | ICD-10-CM | POA: Diagnosis not present

## 2024-09-02 DIAGNOSIS — J9801 Acute bronchospasm: Secondary | ICD-10-CM | POA: Diagnosis not present

## 2024-09-02 DIAGNOSIS — S0003XA Contusion of scalp, initial encounter: Secondary | ICD-10-CM | POA: Diagnosis not present

## 2024-09-02 DIAGNOSIS — J45909 Unspecified asthma, uncomplicated: Secondary | ICD-10-CM | POA: Diagnosis not present

## 2024-09-02 DIAGNOSIS — R939 Diagnostic imaging inconclusive due to excess body fat of patient: Secondary | ICD-10-CM | POA: Diagnosis not present

## 2024-09-02 DIAGNOSIS — R0902 Hypoxemia: Secondary | ICD-10-CM | POA: Diagnosis not present

## 2024-09-02 DIAGNOSIS — M4312 Spondylolisthesis, cervical region: Secondary | ICD-10-CM | POA: Diagnosis not present

## 2024-09-02 LAB — CBC WITH DIFFERENTIAL/PLATELET
Abs Immature Granulocytes: 0.02 K/uL (ref 0.00–0.07)
Basophils Absolute: 0.1 K/uL (ref 0.0–0.1)
Basophils Relative: 1 %
Eosinophils Absolute: 0.2 K/uL (ref 0.0–0.5)
Eosinophils Relative: 3 %
HCT: 41.3 % (ref 36.0–46.0)
Hemoglobin: 12.8 g/dL (ref 12.0–15.0)
Immature Granulocytes: 0 %
Lymphocytes Relative: 21 %
Lymphs Abs: 1.6 K/uL (ref 0.7–4.0)
MCH: 28.8 pg (ref 26.0–34.0)
MCHC: 31 g/dL (ref 30.0–36.0)
MCV: 93 fL (ref 80.0–100.0)
Monocytes Absolute: 0.7 K/uL (ref 0.1–1.0)
Monocytes Relative: 9 %
Neutro Abs: 5 K/uL (ref 1.7–7.7)
Neutrophils Relative %: 66 %
Platelets: 146 K/uL — ABNORMAL LOW (ref 150–400)
RBC: 4.44 MIL/uL (ref 3.87–5.11)
RDW: 13.5 % (ref 11.5–15.5)
WBC: 7.6 K/uL (ref 4.0–10.5)
nRBC: 0 % (ref 0.0–0.2)

## 2024-09-02 LAB — BASIC METABOLIC PANEL WITH GFR
Anion gap: 7 (ref 5–15)
BUN: 27 mg/dL — ABNORMAL HIGH (ref 8–23)
CO2: 22 mmol/L (ref 22–32)
Calcium: 8.4 mg/dL — ABNORMAL LOW (ref 8.9–10.3)
Chloride: 106 mmol/L (ref 98–111)
Creatinine, Ser: 1.13 mg/dL — ABNORMAL HIGH (ref 0.44–1.00)
GFR, Estimated: 49 mL/min — ABNORMAL LOW (ref >60)
Glucose, Bld: 193 mg/dL — ABNORMAL HIGH (ref 70–99)
Potassium: 4 mmol/L (ref 3.5–5.1)
Sodium: 135 mmol/L (ref 135–145)

## 2024-09-02 MED ORDER — PROPOFOL 10 MG/ML IV BOLUS
INTRAVENOUS | Status: AC | PRN
Start: 2024-09-02 — End: 2024-09-02
  Administered 2024-09-02: 63.1 mg via INTRAVENOUS

## 2024-09-02 MED ORDER — MIDAZOLAM HCL 2 MG/2ML IJ SOLN
INTRAMUSCULAR | Status: AC | PRN
  Administered 2024-09-02: 1 mg via INTRAVENOUS

## 2024-09-02 MED ORDER — IPRATROPIUM-ALBUTEROL 0.5-2.5 (3) MG/3ML IN SOLN
3.0000 mL | Freq: Once | RESPIRATORY_TRACT | Status: AC
  Administered 2024-09-02: 3 mL via RESPIRATORY_TRACT
  Filled 2024-09-02: qty 3

## 2024-09-02 MED ORDER — SODIUM CHLORIDE 0.9 % IV BOLUS
1000.0000 mL | Freq: Once | INTRAVENOUS | Status: AC
Start: 2024-09-02 — End: 2024-09-02
  Administered 2024-09-02: 1000 mL via INTRAVENOUS

## 2024-09-02 MED ORDER — MIDAZOLAM HCL 2 MG/2ML IJ SOLN
1.0000 mg | Freq: Once | INTRAMUSCULAR | Status: DC
  Filled 2024-09-02: qty 2

## 2024-09-02 MED ORDER — MORPHINE SULFATE (PF) 4 MG/ML IV SOLN
4.0000 mg | Freq: Once | INTRAVENOUS | Status: AC
  Administered 2024-09-02: 4 mg via INTRAVENOUS
  Filled 2024-09-02: qty 1

## 2024-09-02 MED ORDER — ONDANSETRON HCL 4 MG/2ML IJ SOLN
4.0000 mg | Freq: Once | INTRAMUSCULAR | Status: AC
  Administered 2024-09-02: 4 mg via INTRAVENOUS
  Filled 2024-09-02: qty 2

## 2024-09-02 MED ORDER — PROPOFOL 10 MG/ML IV BOLUS
0.5000 mg/kg | Freq: Once | INTRAVENOUS | Status: DC
  Filled 2024-09-02: qty 20

## 2024-09-02 NOTE — ED Triage Notes (Signed)
 Pt BIB EMS from home after a fall. Per Ems pt complaining of pain to right elbow. Pt denies hitting her head. Pt is on blood thinner for Afib.

## 2024-09-02 NOTE — ED Provider Notes (Signed)
 [facility] Provider Note    Event Date/Time   First MD Initiated Contact with Patient 09/02/24 0422     (approximate)   History   Fall   HPI  Autumn Price is a 83 y.o. female with a past medical history of A-fib on Eliquis , chronic respiratory failure, OSA, asthma, CHF who presents to the emergency department today after a trip and fall.  Patient was reportedly in her usual state of health and tripped over an abnormality in her floor.  She fell forward and landed on her right side.  She immediately had right upper arm pain.  She denies head strike and has no hearing or vision changes or headache.  She has been able to ambulate since this incident.  She arrives via EMS and she received IV Ofirmev  en route.  She denies any chest pain shortness of breath back pain, pain in her hips or any other extremity      Physical Exam   Triage Vital Signs: ED Triage Vitals  Encounter Vitals Group     BP      Girls Systolic BP Percentile      Girls Diastolic BP Percentile      Boys Systolic BP Percentile      Boys Diastolic BP Percentile      Pulse      Resp      Temp      Temp src      SpO2      Weight      Height      Head Circumference      Peak Flow      Pain Score      Pain Loc      Pain Education      Exclude from Growth Chart     Most recent vital signs: Vitals:   09/02/24 0616 09/02/24 0631  BP: (!) 126/55 (!) 133/96  Pulse: 65 67  Resp: 19 16  Temp:    SpO2: 96% 99%    Nursing Triage Note reviewed. Vital signs reviewed and patients oxygen  saturation is normoxic  General: Patient is well nourished, well developed, awake and alert, resting comfortably in no acute distress Head: Normocephalic and atraumatic Eyes: Normal inspection, extraocular muscles intact, no conjunctival pallor Ear, nose, throat: Normal external exam Neck: Normal range of motion, no C-spine tenderness to palpation Respiratory: Patient is in no respiratory distress,  lungs CTAB Cardiovascular: Patient is not tachycardic, RRR  GI: Abd SNT with no guarding or rebound  Back: Normal inspection of the back with good strength and range of motion throughout all ext No T or L-spine tenderness to palpation Extremities: pulses intact with good cap refills, no LE pitting edema or calf tenderness Ecchymosis over right mid humerus and tender to palpation there, no tenderness to palpation over clavicle or shoulder , Patient is able to stand and transfer Neuro: The patient is alert and oriented to person, place, and time, appropriately conversive, with 5/5 bilat UE/LE strength, no gross motor or sensory defects noted. Coordination appears to be adequate. Skin: Warm, dry, and intact Psych: normal mood and affect, no SI or HI  ED Results / Procedures / Treatments   Labs (all labs ordered are listed, but only abnormal results are displayed) Labs Reviewed  CBC WITH DIFFERENTIAL/PLATELET - Abnormal; Notable for the following components:      Result Value   Platelets 146 (*)    All other components within normal limits  BASIC  METABOLIC PANEL WITH GFR - Abnormal; Notable for the following components:   Glucose, Bld 193 (*)    BUN 27 (*)    Creatinine, Ser 1.13 (*)    Calcium  8.4 (*)    GFR, Estimated 49 (*)    All other components within normal limits     EKG   RADIOLOGY CT head:  CT c-spine CXR Xray humerus    PROCEDURES:  Critical Care performed: No  .Sedation  Date/Time: 09/02/2024 7:21 AM  Performed by: Nicholaus Rolland BRAVO, MD Authorized by: Nicholaus Rolland BRAVO, MD   Consent:    Consent obtained:  Written   Consent given by:  Patient   Risks discussed:  Allergic reaction, prolonged hypoxia resulting in organ damage, prolonged sedation necessitating reversal and nausea Universal protocol:    Immediately prior to procedure, a time out was called: yes     Patient identity confirmed:  Arm band Indications:    Procedure performed:  Dislocation  reduction   Procedure necessitating sedation performed by:  Physician performing sedation Pre-sedation assessment:    Time since last food or drink:  6:00 pm   ASA classification: class 3 - patient with severe systemic disease     Mouth opening:  3 or more finger widths   Thyromental distance:  3 finger widths   Mallampati score:  II - soft palate, uvula, fauces visible   Neck mobility: normal     Pre-sedation assessments completed and reviewed: pre-procedure airway patency not reviewed   A pre-sedation assessment was completed prior to the start of the procedure Procedure details (see MAR for exact dosages):    Sedation:  Propofol  and midazolam    Intended level of sedation: deep   Total Provider sedation time (minutes):  22 Post-procedure details:   A post-sedation assessment was completed following the completion of the procedure.   Post-sedation assessments completed and reviewed: post-procedure airway patency not reviewed     Procedure completion:  Tolerated well, no immediate complications .Reduction of dislocation  Date/Time: 09/02/2024 7:22 AM  Performed by: Nicholaus Rolland BRAVO, MD Authorized by: Nicholaus Rolland BRAVO, MD  Consent: Verbal consent obtained. Written consent obtained Consent given by: patient Site marked: the operative site was marked Imaging studies: imaging studies available Patient identity confirmed: arm band Time out: Immediately prior to procedure a time out was called to verify the correct patient, procedure, equipment, support staff and site/side marked as required.  Sedation: Patient sedated: yes  Comments: Reduction of right anterior shoulder dislocation which occurred without complication.  Patient subsequently placed in shoulder immobilizer      MEDICATIONS ORDERED IN ED: Medications  propofol  (DIPRIVAN ) 10 mg/mL bolus/IV push 63.1 mg (63.1 mg Intravenous See Procedure Record 09/02/24 0559)  midazolam  (VERSED ) injection 1 mg (1 mg Intravenous See  Procedure Record 09/02/24 0559)  ipratropium-albuterol  (DUONEB) 0.5-2.5 (3) MG/3ML nebulizer solution 3 mL (has no administration in time range)  morphine  (PF) 4 MG/ML injection 4 mg (4 mg Intravenous Given 09/02/24 0433)  ondansetron  (ZOFRAN ) injection 4 mg (4 mg Intravenous Given 09/02/24 0433)  midazolam  (VERSED ) injection (1 mg Intravenous Given 09/02/24 0559)  propofol  (DIPRIVAN ) 10 mg/mL bolus/IV push (63.1 mg Intravenous Given 09/02/24 0559)  sodium chloride  0.9 % bolus 1,000 mL (1,000 mLs Intravenous New Bag/Given 09/02/24 0600)     IMPRESSION / MDM / ASSESSMENT AND PLAN / ED COURSE  Differential diagnosis includes, but is not limited to, intracranial hemorrhage, cervical spine fracture, humerus fracture, pneumonia, electrolyte derangement anemia   ED course: Patient presents acutely from EMS not on any oxygen  and without any focal neurological deficits.  She is able to stand and transfer to the bed without difficulty.  Given that she is on Eliquis  and did have a fall we will obtain CT imaging of the head and C-spine.  Will also obtain x-ray of the right humerus.  Disposition pending diagnostics   Clinical Course as of 09/02/24 0725  Thu Sep 02, 2024  9553 Notified by nursing that patient's desatted after the pain medication.  This is likely related to the morphine  however will obtain a chest x-ray just to further evaluate [HD]  0525 Patient tells me that she is on 2 L at home at baseline [HD]  0525 On my independent review interpretation, shoulder appears dislocated.  I reviewed the indications benefits risks and alternatives of deep sedation and reduction with the patient who voiced understanding and opted to proceed.  Will await the final radiology read but will get set up in preparation [HD]  0545 Calling radiology reading room for a second time, attempting.  [HD]  [phone] Case discussed with radiologist and he will addend the read and he agrees that there  is an anterior shoulder dislocation [HD]  0610 Shoulder reduction completed [HD]  0724 Patient recovered from deep sedation appropriately but subsequently had more bronchospasm than she did previously.  Will give the patient a DuoNeb (albeit some concern that this may put her into A-fib but she has tolerated these in the past).  Awaiting CT head and CT C-spine.  Patient signed out to oncoming physician at 7:15 AM pending this [HD]    Clinical Course User Index [HD] Nicholaus Rolland BRAVO, MD   -- Risk: 5 This patient has a high risk of morbidity due to further diagnostic testing or treatment. Rationale: This patient's evaluation and management involve a high risk of morbidity due to the potential severity of presenting symptoms, need for diagnostic testing, and/or initiation of treatment that may require close monitoring. The differential includes conditions with potential for significant deterioration or requiring escalation of care. Treatment decisions in the ED, including medication administration, procedural interventions, or disposition planning, reflect this level of risk. COPA: 5 The patient has the following acute or chronic illness/injury that poses a possible threat to life or bodily function: [X] : The patient has a potentially serious acute condition or an acute exacerbation of a chronic illness requiring urgent evaluation and management in the Emergency Department. The clinical presentation necessitates immediate consideration of life-threatening or function-threatening diagnoses, even if they are ultimately ruled out.   FINAL CLINICAL IMPRESSION(S) / ED DIAGNOSES   Final diagnoses:  Fall from standing, initial encounter  Traumatic closed displaced fracture of right shoulder with anterior dislocation, initial encounter  Anticoagulated  Bronchospasm     Rx / DC Orders   ED Discharge Orders     None        Note:  This document was prepared using Dragon voice recognition software  and may include unintentional dictation errors.   Nicholaus Rolland BRAVO, MD 09/02/24 [phone]

## 2024-09-02 NOTE — ED Provider Notes (Signed)
 Procedures  Clinical Course as of 09/02/24 0833  Thu Sep 02, 2024  0446 Notified by nursing that patient's desatted after the pain medication.  This is likely related to the morphine  however will obtain a chest x-ray just to further evaluate [HD]  0525 Patient tells me that she is on 2 L at home at baseline [HD]  0525 On my independent review interpretation, shoulder appears dislocated.  I reviewed the indications benefits risks and alternatives of deep sedation and reduction with the patient who voiced understanding and opted to proceed.  Will await the final radiology read but will get set up in preparation [HD]  0545 Calling radiology reading room for a second time, attempting.  [HD]  [phone] Case discussed with radiologist and he will addend the read and he agrees that there is an anterior shoulder dislocation [HD]  0610 Shoulder reduction completed [HD]  0724 Patient recovered from deep sedation appropriately but subsequently had more bronchospasm than she did previously.  Will give the patient a DuoNeb (albeit some concern that this may put her into A-fib but she has tolerated these in the past).  Awaiting CT head and CT C-spine.  Patient signed out to oncoming physician at 7:15 AM pending this [HD]    Clinical Course User Index [HD] Nicholaus Rolland BRAVO, MD    ----------------------------------------- 8:33 AM on 09/02/2024 ----------------------------------------- Patient is awake and alert, pain adequately controlled.  Lungs clear to auscultation bilaterally.  CT head unremarkable.  Stable for discharge.  Counseled on shoulder precautions and immobilizer use, pain control, cryotherapy, orthopedic follow-up.     Viviann Pastor, MD 09/02/24 [phone]

## 2024-09-03 ENCOUNTER — Ambulatory Visit: Payer: Self-pay

## 2024-09-03 NOTE — Telephone Encounter (Signed)
 Patient advised that she would need to be seen before prescribing any medications. Advised patient to reach back out to ER to see if there was anything further they could do. Advised her to go back to the ER for worsening pain. Patient verbalized understanding.

## 2024-09-03 NOTE — Telephone Encounter (Signed)
 FYI Only or Action Required?: Action required by provider: update on patient condition.  Patient was last seen in primary care on 08/03/2024 by Antonette Angeline ORN, NP.  Called Nurse Triage reporting Fall.  Symptoms began yesterday.  Interventions attempted: tylenol , tramadol , ice.  Symptoms are: unchanged.  Triage Disposition: Call PCP Now  Patient/caregiver understands and will follow disposition?: YesCopied from CRM [id number]. Topic: Clinical - Red Word Triage >> Sep 03, 2024 12:45 PM Fonda T wrote: [facility] that prompted transfer to Nurse Triage: Patient calling, states she fell on yesterday and dislocated right shoulder, ambulance was called and taken to hospital, seen [facility], was discharged with no pain medication.   Patient reports is in extreme pain in her shoulder, has been icing and taking Tylenol , and neither is helping. Reason for Disposition  [1] Caller has URGENT question AND [2] triager unable to answer question  Answer Assessment - Initial Assessment Questions Pt fell yesterday and went ED. Pt has dislocated right shoulder. No pain medication ordered. Pt has taken some tramadol  and tylenol  and stated  it hasn't touched it. Pt is requesting pain medication to be call in. Pt can't come in for appt and no one to bring her. Please update. RN attempted to CAL due to urgency but unable to reach anyone.       1. MECHANISM: How did the fall happen?     Stumped toe fall 2. DOMESTIC VIOLENCE AND ELDER ABUSE SCREENING: Did you fall because someone pushed you or tried to hurt you? If Yes, ask: Are you safe now?     na 3. ONSET: When did the fall happen? (e.g., minutes, hours, or days ago)     Trying to catch self and landed on  4. LOCATION: What part of the body hit the ground? (e.g., back, buttocks, head, hips, knees, hands, head, stomach)     Whole body 5. INJURY: Did you hurt (injure) yourself when you fell? If Yes, ask: What did you injure? Tell me  more about this? (e.g., body area; type of injury; pain severity)     yes 6. PAIN: Is there any pain? If Yes, ask: How bad is the pain? (e.g., Scale 0-10; or none, mild,      10 7. SIZE: For cuts, bruises, or swelling, ask: How large is it? (e.g., inches or centimeters)      Not sure 9. OTHER SYMPTOMS: Do you have any other symptoms? (e.g., dizziness, fever, weakness; new-onset or worsening).  denies  Protocols used: Falls and [facility]

## 2024-09-06 DIAGNOSIS — M25562 Pain in left knee: Secondary | ICD-10-CM | POA: Diagnosis not present

## 2024-09-06 DIAGNOSIS — Z96652 Presence of left artificial knee joint: Secondary | ICD-10-CM | POA: Diagnosis not present

## 2024-09-06 DIAGNOSIS — S43004A Unspecified dislocation of right shoulder joint, initial encounter: Secondary | ICD-10-CM | POA: Diagnosis not present

## 2024-09-12 DIAGNOSIS — I5032 Chronic diastolic (congestive) heart failure: Secondary | ICD-10-CM | POA: Diagnosis not present

## 2024-09-12 DIAGNOSIS — G4733 Obstructive sleep apnea (adult) (pediatric): Secondary | ICD-10-CM | POA: Diagnosis not present

## 2024-09-13 DIAGNOSIS — M25511 Pain in right shoulder: Secondary | ICD-10-CM | POA: Diagnosis not present

## 2024-09-21 NOTE — Progress Notes (Signed)
    Assessment & Plan:  1. OSA (obstructive sleep apnea) (Primary) - Ambulatory Referral for DME   Patient Instructions  We are going to make sure that you get a humidifier bottle for your oxygen  concentrator.  Use only distilled water  in this container.  This should help with your nose dryness.  Continue your efforts at weight loss.  Very proud that you were able to lose 4 pounds! Good job!  We will see you in follow-up in 2 months time call sooner should any new problems arise.  Please note: late entry documentation due to logistical difficulties during COVID-19 pandemic. This note is filed for information purposes only, and is not intended to be used for billing, nor does it represent the full scope/nature of the visit in question. Please see any associated scanned media linked to date of encounter for additional pertinent information.  Subjective:    HPI: Autumn Price is a 83 y.o. female presenting to the pulmonology clinic on 07/11/2020 with report of: Follow-up (Cough is about the same. She has been sneezing often- started allegra and this has helped. She uses her atrovent  inhaler 2 x daily on average. )     Outpatient Encounter Medications as of 07/11/2020  Medication Sig   acetaminophen  (TYLENOL ) 500 MG tablet Take 500 mg by mouth every 6 (six) hours as needed for mild pain or moderate pain.   [DISCONTINUED] albuterol  (VENTOLIN  HFA) 108 (90 Base) MCG/ACT inhaler Inhale 1-2 puffs into the lungs every 6 (six) hours as needed for wheezing or shortness of breath.   [DISCONTINUED] amLODipine  (NORVASC ) 5 MG tablet TAKE ONE TABLET BY MOUTH ONCE DAILY   [DISCONTINUED] Fexofenadine-Pseudoephedrine (ALLEGRA-D 24 HOUR PO) Take 1 tablet by mouth daily as needed.   [DISCONTINUED] furosemide  (LASIX ) 20 MG tablet TAKE ONE TABLET BY MOUTH ONCE DAILY   [DISCONTINUED] ipratropium (ATROVENT  HFA) 17 MCG/ACT inhaler Inhale 2 puffs into the lungs 3 (three) times daily as needed for wheezing.    [DISCONTINUED] omeprazole  (PRILOSEC) 40 MG capsule Take 1 capsule (40 mg total) by mouth daily.   [DISCONTINUED] traMADol  (ULTRAM ) 50 MG tablet Take 2 tablets by mouth twice daily as needed   [DISCONTINUED] chlorpheniramine-HYDROcodone  (TUSSIONEX PENNKINETIC ER) 10-8 MG/5ML SUER Take 5 mLs by mouth every 12 (twelve) hours as needed for cough. Will cause drowsiness; NO DRIVING.   [DISCONTINUED] guaiFENesin (MUCINEX) 600 MG 12 hr tablet Take by mouth 2 (two) times daily.   [DISCONTINUED] HYDROcodone -homatropine (HYCODAN) 5-1.5 MG/5ML syrup Take 5 mLs by mouth at bedtime as needed for cough.   [DISCONTINUED] sertraline  (ZOLOFT ) 50 MG tablet TAKE 1 AND 1/2 TABLETS BY MOUTH ONCE DAILY   [DISCONTINUED] albuterol  (PROVENTIL ) (2.5 MG/3ML) 0.083% nebulizer solution 2.5 mg    No facility-administered encounter medications on file as of 07/11/2020.      Objective:   Vitals:   07/11/20 1344  BP: (!) 110/64  Pulse: 76  Temp: 98.1 F (36.7 C)  Height: 5' 4 (1.626 m)  Weight: (!) 261 lb 12.8 oz (118.8 kg)  SpO2: 96% Comment: on RA  TempSrc: Oral  BMI (Calculated): 44.92     Physical exam documentation is limited by delayed entry of information.

## 2024-09-22 ENCOUNTER — Other Ambulatory Visit: Payer: Self-pay | Admitting: Internal Medicine

## 2024-09-23 ENCOUNTER — Ambulatory Visit: Admitting: Pulmonary Disease

## 2024-09-23 ENCOUNTER — Encounter: Payer: Self-pay | Admitting: Pulmonary Disease

## 2024-09-23 VITALS — BP 110/66 | HR 64 | Temp 98.0°F | Ht 64.0 in | Wt 279.0 lb

## 2024-09-23 DIAGNOSIS — Z23 Encounter for immunization: Secondary | ICD-10-CM | POA: Diagnosis not present

## 2024-09-23 DIAGNOSIS — R0602 Shortness of breath: Secondary | ICD-10-CM

## 2024-09-23 DIAGNOSIS — I480 Paroxysmal atrial fibrillation: Secondary | ICD-10-CM

## 2024-09-23 DIAGNOSIS — J4489 Other specified chronic obstructive pulmonary disease: Secondary | ICD-10-CM | POA: Diagnosis not present

## 2024-09-23 DIAGNOSIS — G4736 Sleep related hypoventilation in conditions classified elsewhere: Secondary | ICD-10-CM | POA: Diagnosis not present

## 2024-09-23 DIAGNOSIS — S46011D Strain of muscle(s) and tendon(s) of the rotator cuff of right shoulder, subsequent encounter: Secondary | ICD-10-CM

## 2024-09-23 DIAGNOSIS — S46011S Strain of muscle(s) and tendon(s) of the rotator cuff of right shoulder, sequela: Secondary | ICD-10-CM

## 2024-09-23 DIAGNOSIS — R053 Chronic cough: Secondary | ICD-10-CM

## 2024-09-23 DIAGNOSIS — E669 Obesity, unspecified: Secondary | ICD-10-CM

## 2024-09-23 DIAGNOSIS — Z6841 Body Mass Index (BMI) 40.0 and over, adult: Secondary | ICD-10-CM

## 2024-09-23 MED ORDER — BREZTRI AEROSPHERE 160-9-4.8 MCG/ACT IN AERO: 2.0000 | INHALATION_SPRAY | Freq: Two times a day (BID) | RESPIRATORY_TRACT | Status: AC

## 2024-09-23 MED ORDER — BREZTRI AEROSPHERE 160-9-4.8 MCG/ACT IN AERO: 2.0000 | INHALATION_SPRAY | Freq: Two times a day (BID) | RESPIRATORY_TRACT | 11 refills | Status: AC

## 2024-09-23 NOTE — Patient Instructions (Signed)
 VISIT SUMMARY:  Today, you were seen for a dislocated shoulder and breathing issues. You have been experiencing persistent shoulder pain and difficulty raising your arm due to a recent fall, which resulted in a completely torn rotator cuff and a bone bruise. Additionally, your breathing issues have improved significantly over the last six months, and you no longer experience frequent coughing fits.  YOUR PLAN:  -CHRONIC RESPIRATORY DISEASE WITH CHRONIC COUGH: Chronic respiratory disease is a long-term condition that affects your lungs and airways, causing symptoms like coughing and difficulty breathing. Your breathing has improved, and you no longer have frequent coughing fits. We provided samples of the Breztri  inhaler and sent a refill for your inhalers. You also received your flu shot today.  -COMPLETE ROTATOR CUFF TEAR WITH SHOULDER PAIN AND BONE CONTUSION: A complete rotator cuff tear is a severe injury where the tendons around your shoulder joint are completely torn, causing pain and limited movement. You also have a bone bruise. Surgical intervention was considered but not pursued due to your age and limited mobility. You will continue with physical therapy to help manage the pain and improve your shoulder function.  INSTRUCTIONS:  Please continue using your Breztri  inhalers as prescribed. Follow up with your physical therapy sessions for your shoulder. If you experience any worsening symptoms or new issues, please contact our office.

## 2024-09-23 NOTE — Telephone Encounter (Signed)
 Requested medication (s) are due for refill today - yes  Requested medication (s) are on the active medication list -yes  Future visit scheduled -yes  Last refill: 08/26/24 #120  Notes to clinic: non delegated Rx  Requested Prescriptions  Pending Prescriptions Disp Refills   traMADol  (ULTRAM ) 50 MG tablet [Pharmacy Med Name: TRAMADOL  HCL 50 MG TABLET] 120 tablet 0    Sig: Take 1-2 tablets (50-100 mg total) by mouth every 12 (twelve) hours as needed.     Not Delegated - Analgesics:  Opioid Agonists Failed - 09/23/2024  4:24 PM      Failed - This refill cannot be delegated      Failed - Urine Drug Screen completed in last 360 days      Failed - Valid encounter within last 3 months    Recent Outpatient Visits           1 month ago Acute pain of left knee   [facility] [place], [place] W, NP   4 months ago History of TIA (transient ischemic attack)   [facility] [place], Angeline ORN, NP                 Requested Prescriptions  Pending Prescriptions Disp Refills   traMADol  (ULTRAM ) 50 MG tablet [Pharmacy Med Name: TRAMADOL  HCL 50 MG TABLET] 120 tablet 0    Sig: Take 1-2 tablets (50-100 mg total) by mouth every 12 (twelve) hours as needed.     Not Delegated - Analgesics:  Opioid Agonists Failed - 09/23/2024  4:24 PM      Failed - This refill cannot be delegated      Failed - Urine Drug Screen completed in last 360 days      Failed - Valid encounter within last 3 months    Recent Outpatient Visits           1 month ago Acute pain of left knee    [facility] [place], Angeline ORN, NP   4 months ago History of TIA (transient ischemic attack)   [facility] [place], Angeline ORN, NP

## 2024-09-23 NOTE — Progress Notes (Signed)
 Subjective:    Patient ID: Autumn Price, female    DOB: 07/23/1941, 83 y.o.   MRN: [id number]  Patient Care Team: Antonette Angeline ORN, NP as PCP - General (Internal Medicine) Shlomo Wilbert SAUNDERS, MD as PCP - Sleep Medicine (Cardiology) Cindie Ole DASEN, MD as PCP - Electrophysiology (Cardiology) Tamea Dedra CROME, MD as Consulting Physician (Pulmonary Disease) Dewane Annalee, DO as Referring Physician (Cardiology)  Chief Complaint  Patient presents with   Asthma    Shortness of breath on exertion. Wheezing.     BACKGROUND/INTERVAL:Autumn Price is a 83 year old morbidly obese, lifelong never smoker who presents for follow-up visit on the issue of moderate persistent asthma/asthmatic bronchitis.  I last saw her on 21 May 2024. She has been diagnosed with paroxysmal atrial fibrillation and is being followed by cardiology at [facility] for this issue.  She is on anticoagulation.  This is a scheduled appointment.  HPI Discussed the use of AI scribe software for clinical note transcription with the patient, who gave verbal consent to proceed.  History of Present Illness   Autumn Price is an 83 year old female who presents with a dislocated shoulder and breathing issues. She is accompanied by her grandson, Sherlean.  She experienced a fall recently, resulting in a dislocated shoulder and rotator cuff tear. She has persistent pain and an inability to raise her arm fully. She reports that an MRI was performed and she was told it showed a completely torn rotator cuff and a bone bruise. She has been undergoing physical therapy.  She has a history of breathing issues, which have improved over the last six months. Previously, she experienced frequent coughing fits and required oxygen  therapy. She no longer has coughing fits, and her breathing has improved significantly. She is currently using Breztri  inhaler.  She is on nocturnal oxygen .  She has not yet received her flu shot this year.      DATA: 10/13/2019 2D echo: LVEF 60 to 65%, essentially normal, no pulmonary hypertension 06/05/2020 overnight oximetry: Shows desaturations to 72% throughout the course of the night with a high oxygen  desaturation index/sleep disturbance (24 events per hour) 06/27/2020 PFTs: FEV1 1.86 L or 93% predicted, FVC 2.28 L or 86% predicted.  FEV1/FVC 82%. No bronchodilator response.  Diffusion capacity not performed despite being ordered.  No obstructive defect.  No restrictive defect 10/09/2020 Home sleep study: Desaturations noted during study as low as 81%.  Average of 91%.  AHI was 15.5, consistent with moderate obstructive sleep apnea 10/17/2020 nuclear stress test: Low risk study, sensitivity reduced to the patient's body habitus, coronary artery calcifications noted on attenuation correction CT. 06/03/2022 chest x-ray PA and lateral: No active cardiopulmonary disease. 02/21/2022 CBC with differential/IgE: CBC normal with no eosinophilia, IgE 8 IU/mL (normal). 02/04/2023 PFTs: FEV1 1.69 L or 90% predicted, FVC 2.09 L or 82% predicted, FEV1/FVC 81%, no significant bronchodilator response, diffusion capacity normal.  Consistent with mild obstructive defect. 07/21/2023 Zio patch report: Episodes of PSVT and atrial flutter. 07/24/2023 echocardiogram: LVEF 55 to 60% normal right-sided structures, mild mitral valve regurgitation, mild aortic sclerosis with no stenosis.    Review of Systems A 10 point review of systems was performed and it is as noted above otherwise negative.   Patient Active Problem List   Diagnosis Date Noted   Aortic atherosclerosis 05/04/2024   Polycythemia 05/04/2024   Chronic asthmatic bronchitis (HCC) 01/11/2024   Paroxysmal atrial fibrillation (HCC) 11/10/2023   History of TIA (transient ischemic attack) 11/10/2023  Chronic anticoagulation 11/06/2023   MDD (major depressive disorder), single episode, mild 06/25/2023   GERD (gastroesophageal reflux disease) 09/03/2022    Sleep apnea 09/03/2022   Thrombocytopenia 01/03/2022   Chronic hypoxemic respiratory failure (HCC) 01/02/2022   Chronic diastolic heart failure (HCC) 10/25/2019   Class 3 severe obesity due to excess calories with body mass index (BMI) of 45.0 to 49.9 in adult [facility]) 05/22/2012   Mixed stress and urge urinary incontinence 03/19/2011   Essential hypertension, benign 08/21/2010   Osteoarthritis, multiple sites 08/21/2010    Social History   Tobacco Use   Smoking status: Never    Passive exposure: Never   Smokeless tobacco: Never  Substance Use Topics   Alcohol use: No    Allergies  Allergen Reactions   Atorvastatin  Other (See Comments)    Intense dreams    Current Meds  Medication Sig   acetaminophen  (TYLENOL ) 500 MG tablet Take 500 mg by mouth every 6 (six) hours as needed for mild pain or moderate pain.   budesonide -glycopyrrolate-formoterol  (BREZTRI  AEROSPHERE) 160-9-4.8 MCG/ACT AERO inhaler Inhale 2 puffs into the lungs in the morning and at bedtime.   ELIQUIS  5 MG TABS tablet Take 5 mg by mouth 2 (two) times daily.   ezetimibe (ZETIA) 10 MG tablet Take 10 mg by mouth daily.   fluticasone  (FLONASE ) 50 MCG/ACT nasal spray PLACE 1 SPRAY INTO BOTH NOSTRILS DAILY AS NEEDED FOR ALLERGIES OR RHINITIS.   ketoconazole  (NIZORAL ) 2 % cream APPLY 1 APPLICATION TOPICALLY TO GROIN AREA 2 TIMES A DAY AS NEEDED FOR IRRITATION   levalbuterol  (XOPENEX  HFA) 45 MCG/ACT inhaler Inhale 2 puffs into the lungs every 6 (six) hours as needed for wheezing.   metoprolol  succinate (TOPROL  XL) 25 MG 24 hr tablet Take 1.5 tablets (37.5 mg total) by mouth 2 (two) times daily.   montelukast  (SINGULAIR ) 10 MG tablet Take 1 tablet (10 mg total) by mouth at bedtime.   MYRBETRIQ  50 MG TB24 tablet Take 50 mg by mouth daily.   pantoprazole  (PROTONIX ) 40 MG tablet Take 1 tablet (40 mg total) by mouth daily.   sertraline  (ZOLOFT ) 100 MG tablet Take 1 tablet (100 mg total) by mouth daily.   traMADol  (ULTRAM ) 50 MG  tablet Take 1-2 tablets (50-100 mg total) by mouth every 12 (twelve) hours as needed.   [DISCONTINUED] Budeson-Glycopyrrol-Formoterol  (BREZTRI  AEROSPHERE) 160-9-4.8 MCG/ACT AERO Inhale 2 puffs into the lungs in the morning and at bedtime.    Immunization History  Administered Date(s) Administered   Fluad Quad(high Dose 65+) 10/11/2020   INFLUENZA, HIGH DOSE SEASONAL PF 09/23/2024   Influenza Inj Mdck Quad Pf 10/30/2018, 09/25/2019   Influenza, Seasonal, Injecte, Preservative Fre 09/30/2016   Influenza,inj,Quad PF,6+ Mos 09/12/2017   Influenza-Unspecified 09/12/2021, 09/19/2022, 10/09/2023   Pneumococcal Conjugate-13 06/10/2014   Pneumococcal Polysaccharide-23 01/16/2009, 08/12/2017   Td 08/21/2010   Tdap 09/19/2022   Zoster Recombinant(Shingrix ) 09/30/2019, 01/11/2020   Zoster, Live 09/18/2016        Objective:     BP 110/66   Pulse 64   Temp 98 F (36.7 C) (Temporal)   Ht 5' 4 (1.626 m)   Wt 279 lb (126.6 kg)   SpO2 95%   BMI 47.89 kg/m   SpO2: 95 %  GENERAL: Morbidly obese woman, no acute distress, presents in transport chair, no conversational dyspnea.  HEAD: Normocephalic, atraumatic.  EYES: Pupils equal, round, reactive to light.  No scleral icterus.  MOUTH: Dentition intact, oral mucosa moist. NECK: Supple. No thyromegaly. Trachea midline. No JVD.  No adenopathy. PULMONARY: Good air entry bilaterally.  No adventitious sounds. CARDIOVASCULAR: S1 and S2. Regular rate and rhythm.  No rubs, murmurs or gallops heard. ABDOMEN: Obese otherwise benign. MUSCULOSKELETAL: Decreased range of motion on the right shoulder.  No clubbing, no edema.  NEUROLOGIC: No focal deficit, no gait disturbance, speech is fluent. SKIN: Intact,warm,dry.  Limited exam shows no rashes PSYCH: Mood appropriate, behavior normal.    Assessment & Plan:     ICD-10-CM   1. Chronic asthmatic bronchitis (HCC)  J44.89     2. Nocturnal hypoxemia due to obesity  E66.9    G47.36     3.  Shortness of breath  R06.02     4. Paroxysmal atrial fibrillation (HCC)  I48.0     5. Morbid obesity (HCC)  E66.01     6. Traumatic tear of right rotator cuff, unspecified tear extent, sequela  S46.011S     7. Need for influenza vaccination  Z23       Orders Placed This Encounter  Procedures   Flu vaccine HIGH DOSE PF(Fluzone Trivalent)    Meds ordered this encounter  Medications   budesonide -glycopyrrolate-formoterol  (BREZTRI  AEROSPHERE) 160-9-4.8 MCG/ACT AERO inhaler    Sig: Inhale 2 puffs into the lungs in the morning and at bedtime.    Dispense:  10.7 g    Refill:  11    Lot Number?:   [id number]    Expiration Date?:   05/16/2025    Manufacturer?:   [employer] [[id number]]    Quantity:   2   budesonide -glycopyrrolate-formoterol  (BREZTRI  AEROSPHERE) 160-9-4.8 MCG/ACT AERO inhaler    Sig: Inhale 2 puffs into the lungs in the morning and at bedtime.    Dispense:  2 each    Lot Number?:   [id number]    Expiration Date?:   10/16/2026    Manufacturer?:   [employer] [[id number]]    NDC:   9689-5383-71 [[id number]]    Quantity:   2    Discussion:    Chronic respiratory disease with chronic cough Chronic respiratory disease with chronic cough, showing improvement in breathing and reduced coughing fits over the last six months. No smoking history. - Provide samples of Breztri  inhaler - Send refill for inhalers - Administer flu vaccine  Complete rotator cuff tear with shoulder pain and bone contusion Complete rotator cuff tear and shoulder pain following a fall. MRI confirmed the tear and suggested a bone contusion. Surgical intervention was considered but not pursued due to her age (55 years) and limited mobility. Physical therapy is ongoing.     Will see the patient in follow-up in 4 months time call sooner should any new problems arise.  Advised if symptoms do not improve or worsen, to please contact office for sooner follow up or seek emergency care.    I spent 30 minutes of dedicated to  the care of this patient on the date of this encounter to include pre-visit review of records, face-to-face time with the patient discussing conditions above, post visit ordering of testing, clinical documentation with the electronic health record, making appropriate referrals as documented, and communicating necessary findings to members of the patients care team.     C. Leita Sanders, MD Advanced Bronchoscopy PCCM [facility]    *This note was generated using voice recognition software/Dragon and/or AI transcription program.  Despite best efforts to proofread, errors can occur which can change the meaning. Any transcriptional errors that result from this process are unintentional and may not be fully corrected at the time  of dictation.

## 2024-10-25 ENCOUNTER — Other Ambulatory Visit: Payer: Self-pay | Admitting: Internal Medicine

## 2024-10-26 NOTE — Telephone Encounter (Signed)
 Requested medication (s) are due for refill today: Yes  Requested medication (s) are on the active medication list: Yes  Last refill:  09/24/24  Future visit scheduled: Yes  Notes to clinic:  Not delegated.    Requested Prescriptions  Pending Prescriptions Disp Refills   traMADol  (ULTRAM ) 50 MG tablet [Pharmacy Med Name: TRAMADOL  HCL 50 MG TABLET] 120 tablet 0    Sig: Take 1-2 tablets (50-100 mg total) by mouth every 12 (twelve) hours as needed.     There is no refill protocol information for this order

## 2024-11-15 ENCOUNTER — Encounter: Admitting: Internal Medicine

## 2024-11-22 ENCOUNTER — Other Ambulatory Visit: Payer: Self-pay | Admitting: Cardiology

## 2024-11-22 ENCOUNTER — Other Ambulatory Visit: Payer: Self-pay | Admitting: Internal Medicine

## 2024-11-24 NOTE — Telephone Encounter (Signed)
 Requested medication (s) are due for refill today: Yes  Requested medication (s) are on the active medication list: Yes  Last refill:  10/27/24  Future visit scheduled: Yes  Notes to clinic:  Not delegated.    Requested Prescriptions  Pending Prescriptions Disp Refills   traMADol  (ULTRAM ) 50 MG tablet [Pharmacy Med Name: TRAMADOL  HCL 50 MG TABLET] 120 tablet 0    Sig: Take 1-2 tablets (50-100 mg total) by mouth every 12 (twelve) hours as needed.     There is no refill protocol information for this order

## 2024-11-26 ENCOUNTER — Other Ambulatory Visit: Payer: Self-pay | Admitting: Internal Medicine

## 2024-11-26 ENCOUNTER — Ambulatory Visit: Payer: Self-pay

## 2024-11-26 MED ORDER — TRAMADOL HCL 50 MG PO TABS: 50.0000 mg | ORAL_TABLET | Freq: Two times a day (BID) | ORAL | 0 refills | Status: AC | PRN

## 2024-11-26 NOTE — Telephone Encounter (Signed)
 Refilled

## 2024-11-26 NOTE — Telephone Encounter (Signed)
 Patient scheduled for office visit on 1/28.  Would like refill of tramadol  until she is seen in office. RX refill has been recently denied  Tramadol  controls arthritis pain

## 2024-11-26 NOTE — Telephone Encounter (Signed)
 FYI Only or Action Required?: Action required by provider: medication refill request.  Patient was last seen in primary care on 08/03/2024 by Antonette Angeline ORN, NP.  Called Nurse Triage reporting Medication Refill.  Symptoms began chronic/arthritis.  Interventions attempted: Prescription medications: tramadol .  Symptoms are: gradually worsening.  Triage Disposition: Call PCP When Office is Open  Patient/caregiver understands and will follow disposition?: Yes   Copied from CRM #[id number]. Topic: Clinical - Medication Question >> Nov 26, 2024 12:07 PM Geneva B wrote: Reason for CRM: patient has question about rx  traMADol  (ULTRAM ) 50 MG tablet  please call pt [phone] Reason for Disposition  Caller requesting a CONTROLLED substance prescription refill (e.g., narcotics, ADHD medicines)  Answer Assessment - Initial Assessment Questions 1. DRUG NAME: What medicine do you need to have refilled?     tramadol  2. REFILLS REMAINING: How many refills are remaining? Notes: The label on the medicine or pill bottle will show how many refills are remaining. If there are no refills remaining, then a renewal may be needed.     xero  4. PRESCRIBER: Who prescribed it? Note: The prescribing doctor or group is responsible for refill approvals..     Dr. Jimmy 5. PHARMACY: Have you contacted your pharmacy (drugstore)? Note: Some pharmacies will contact the doctor (or NP/PA).      [address] 6. SYMPTOMS: Do you have any symptoms?     Arthritis pain   Patient scheduled for office visit on 1/28.  Would like refill of tramadol  until she is seen in office. RX refill has been recently denied  Refill request sent  Protocols used: Medication Refill and Renewal Call-A-AH

## 2024-11-29 NOTE — Telephone Encounter (Signed)
 Requested medication (s) are due for refill today: no  Requested medication (s) are on the active medication list: yes  Last refill:  11/26/24  Future visit scheduled: yes  Notes to clinic:  unable to refuse, duplicate request     Requested Prescriptions  Pending Prescriptions Disp Refills   traMADol  (ULTRAM ) 50 MG tablet [Pharmacy Med Name: TRAMADOL  HCL 50 MG TABLET] 120 tablet 0    Sig: Take 1-2 tablets (50-100 mg total) by mouth every 12 (twelve) hours as needed.     There is no refill protocol information for this order

## 2025-01-03 ENCOUNTER — Telehealth: Payer: Self-pay

## 2025-01-03 NOTE — Telephone Encounter (Signed)
 Copied from CRM #[id number]. Topic: Clinical - Order For Equipment >> Jan 03, 2025  3:35 PM Deaijah H wrote: Reason for CRM: Helayne w/ [employer] called in to follow up on DME script incontinence supply  and needing a a signature from PCP faxed 12/27/24. Please call [phone]

## 2025-01-04 NOTE — Telephone Encounter (Signed)
 I have not seen this form, can you have them refax?

## 2025-01-04 NOTE — Telephone Encounter (Signed)
 Attempted to reach [place] at provided number. Unable to connect with actual person. Okay to have them re fax if they call back.

## 2025-01-12 ENCOUNTER — Encounter: Admitting: Internal Medicine

## 2025-02-10 ENCOUNTER — Encounter: Admitting: Internal Medicine

## 2025-02-17 ENCOUNTER — Ambulatory Visit: Admitting: Pulmonary Disease
